# Patient Record
Sex: Male | Born: 1960
Health system: Southern US, Community
[De-identification: ages and names within clinical notes are randomized; demographics above are authoritative.]

## PROBLEM LIST (undated history)

## (undated) DIAGNOSIS — C801 Malignant (primary) neoplasm, unspecified: Secondary | ICD-10-CM

## (undated) DIAGNOSIS — F419 Anxiety disorder, unspecified: Secondary | ICD-10-CM

## (undated) DIAGNOSIS — F1910 Other psychoactive substance abuse, uncomplicated: Secondary | ICD-10-CM

## (undated) DIAGNOSIS — F29 Unspecified psychosis not due to a substance or known physiological condition: Secondary | ICD-10-CM

## (undated) DIAGNOSIS — F259 Schizoaffective disorder, unspecified: Secondary | ICD-10-CM

## (undated) HISTORY — PX: HERNIA REPAIR: SHX51

---

## 2010-04-21 ENCOUNTER — Emergency Department (HOSPITAL_COMMUNITY)
Admission: EM | Admit: 2010-04-21 | Discharge: 2010-04-22 | Disposition: A | Discharge status: Other Acute Inpatient Hospital | Payer: Self-pay | Source: Home / Self Care | Admitting: Emergency Medicine

## 2010-04-21 LAB — COMPREHENSIVE METABOLIC PANEL
ALT: 26 U/L (ref 0–53)
AST: 28 U/L (ref 0–37)
Albumin: 4.4 g/dL (ref 3.5–5.2)
Alkaline Phosphatase: 90 U/L (ref 39–117)
BUN: 9 mg/dL (ref 6–23)
CO2: 23 mEq/L (ref 19–32)
Calcium: 9.1 mg/dL (ref 8.4–10.5)
Chloride: 110 mEq/L (ref 96–112)
Creatinine, Ser: 0.97 mg/dL (ref 0.4–1.5)
GFR calc Af Amer: >60 mL/min (ref >60)
GFR calc non Af Amer: >60 mL/min (ref >60)
Glucose, Bld: 95 mg/dL (ref 70–99)
Potassium: 4.2 mEq/L (ref 3.5–5.1)
Sodium: 144 mEq/L (ref 135–145)
Total Bilirubin: 1 mg/dL (ref 0.3–1.2)
Total Protein: 7.9 g/dL (ref 6.0–8.3)

## 2010-04-21 LAB — CBC
HCT: 42.7 % (ref 39.0–52.0)
Hemoglobin: 15.3 g/dL (ref 13.0–17.0)
MCH: 31.9 pg (ref 26.0–34.0)
MCHC: 35.8 g/dL (ref 30.0–36.0)
MCV: 89 fL (ref 78.0–100.0)
Platelets: 262 10*3/uL (ref 150–400)
RBC: 4.8 MIL/uL (ref 4.22–5.81)
RDW: 14.1 % (ref 11.5–15.5)
WBC: 5.7 10*3/uL (ref 4.0–10.5)

## 2010-04-21 LAB — DIFFERENTIAL
Basophils Absolute: 0 10*3/uL (ref 0.0–0.1)
Basophils Relative: 0 % (ref 0–1)
Eosinophils Absolute: 0 10*3/uL (ref 0.0–0.7)
Eosinophils Relative: 0 % (ref 0–5)
Lymphocytes Relative: 51 % — ABNORMAL HIGH (ref 12–46)
Lymphs Abs: 2.9 10*3/uL (ref 0.7–4.0)
Monocytes Absolute: 0.2 10*3/uL (ref 0.1–1.0)
Monocytes Relative: 3 % (ref 3–12)
Neutro Abs: 2.6 10*3/uL (ref 1.7–7.7)
Neutrophils Relative %: 45 % (ref 43–77)

## 2010-04-21 LAB — RAPID URINE DRUG SCREEN, HOSP PERFORMED
Amphetamines: NOT DETECTED
Barbiturates: NOT DETECTED
Benzodiazepines: NOT DETECTED
Cocaine: NOT DETECTED
Opiates: NOT DETECTED
Tetrahydrocannabinol: POSITIVE — AB

## 2010-04-21 LAB — ETHANOL: Alcohol, Ethyl (B): 295 mg/dL — ABNORMAL HIGH (ref 0–10)

## 2010-04-22 ENCOUNTER — Inpatient Hospital Stay (HOSPITAL_COMMUNITY)
Admission: AD | Admit: 2010-04-22 | Discharge: 2010-04-26 | Payer: Self-pay | Attending: Psychiatry | Admitting: Psychiatry

## 2010-04-22 LAB — ETHANOL: Alcohol, Ethyl (B): <5 mg/dL (ref 0–10)

## 2010-04-22 LAB — GLUCOSE, CAPILLARY: Glucose-Capillary: 94 mg/dL (ref 70–99)

## 2010-04-23 LAB — URINALYSIS, ROUTINE W REFLEX MICROSCOPIC
Bilirubin Urine: NEGATIVE
Ketones, ur: NEGATIVE mg/dL
Leukocytes, UA: NEGATIVE
Nitrite: NEGATIVE
Protein, ur: NEGATIVE mg/dL
Specific Gravity, Urine: 1.013 (ref 1.005–1.030)
Urine Glucose, Fasting: NEGATIVE mg/dL
Urobilinogen, UA: 0.2 mg/dL (ref 0.0–1.0)
pH: 5.5 (ref 5.0–8.0)

## 2010-04-23 LAB — URINE MICROSCOPIC-ADD ON

## 2010-04-27 NOTE — H&P (Signed)
NAME:  Keith Murray, Keith Murray                ACCOUNT NO.:  [id number]  MEDICAL RECORD NO.:  [id number]          PATIENT TYPE:  IPS  LOCATION:  0508                          [facility]ACILITY:  [facility]  PHYSICIAN:  Marlis Edelson, DO        DATE OF BIRTH:  March 26, 1961  DATE OF ADMISSION:  04/22/2010 DATE OF DISCHARGE:                      PSYCHIATRIC ADMISSION ASSESSMENT   This is a 50 year old male who was involuntarily petitioned on April 22, 2010.  HISTORY OF PRESENT ILLNESS:  The patient is here on papers that state that the patient is a danger to harm himself and others, has been smoking crack cocaine and marijuana, and drinking alcohol for several days, stating that he wants to kill himself.  Previously had been on Cogentin and Prolixin and has been committed prior to the [facility] in 2011.  Reporting seeing and hearing things that are not there. The patient states that his niece had convinced him to come and get help with his drinking.  He states that he is only drinking 2 beers a day. He denies any suicidal thoughts or hallucinations.  He does not feel that alcohol is a problem for him and states upon discharge that he will return to [place] to be with family and no longer live with his niece.  PAST PSYCHIATRIC HISTORY:  First admission to [facility]. No current outpatient mental treatment.  SOCIAL HISTORY:  The patient is single and living with his niece in [place].  He is on disability.  FAMILY HISTORY:  None.  ALCOHOL AND DRUG HISTORY:  The patient reporting drinking only 2 beers, but the patient did have a blood alcohol level of 295.  He denies any substance use.  Urine drug screen is positive for cannabis.  PRIMARY CARE PROVIDER:  None.  MEDICAL PROBLEMS:  The patient denies any acute or chronic health issues.  MEDICATIONS:  The patient lists Prolixin injection and receives his medication at the [facility].  Compliance is unknown.  DRUG ALLERGIES:  No  known allergies.  PHYSICAL EXAMINATION:  GENERAL:  This is a normally-developed middle- aged male.  He was assessed in the emergency department at [facility].  Physical exam was reviewed.  No significant findings.  The patient did receive Geodon 20 mg IM.  His laboratory data shows a CBC essentially within normal limits.  Lymphocytes at 51.  Urine drug screen is positive for cannabis.  Alcohol level of 295.  His CMET is within normal limits.  Alcohol level was stated at 295.  The patient did receive again Geodon, as he was agitated and combative.  MENTAL STATUS EXAMINATION:  The patient is fully alert and cooperative. He does appear somewhat guarded.  Answers only questions briefly.  Poor eye contact.  He is casually dressed.  He denies any complaints.  Does not appear to be overly psychotic.  Judgment and insight are fair at this time and overall is a poor historian.  DIAGNOSES:  Axis I:  Alcohol intoxication, alcohol abuse, rule out dependence.  Cannabis abuse. Axis II:  Deferred. Axis III:  No known medical conditions. Axis IV:  Problems with his  primary support group, his niece, possible other psychosocial problems related to substance use, noncompliance of medications. Axis V:  Current is 35.  PLAN:  Put patient on Librium protocol.  Monitor withdrawal symptoms. Address his substance use.  Clarify living situation and continue to assess comorbidities.  Will clarify his Prolixin injection with the [facility].  Will assess the patient's motivation for rehab. Tentative length of stay is 4 to 6 days.     Landry Corporal, N.P.   ______________________________ Marlis Edelson, DO    JO/MEDQ  D:  04/23/2010  T:  04/23/2010  Job:  [id number]  Electronically Signed by Limmie PatriciaP. on 04/26/2010 03:53:08 PM Electronically Signed by Marlis Edelson MD on 04/27/2010 07:57:42 PM

## 2010-05-11 NOTE — Discharge Summary (Addendum)
NAME:  Keith Murray, Keith Murray                ACCOUNT NO.:  [id number]  MEDICAL RECORD NO.:  [id number]           PATIENT TYPE:  I  LOCATION:  0508                          [facility]ILITY[facility]  [facility]  PHYSICIAN:  Marlis Edelson, DO        DATE OF BIRTH:  October 14, 1960  DATE OF ADMISSION:  04/22/2010 DATE OF DISCHARGE:  04/26/2010                              DISCHARGE SUMMARY   CHIEF COMPLAINT:  Substance use.  HISTORY OF CHIEF COMPLAINT:  [name] is a 50 year old African American male admitted to the [facility] April 22, 2010.  The patient had been initially admitted via involuntary petition because of concerns of harming himself in that he had been smoking crack cocaine, using marijuana and drinking alcohol for several days.  [name] has had a longstanding history of psychotic symptoms for which he has been treated with Prolixin injections and Cogentin.  He has been followed by the [facility] for mental health.  He has had perceptual symptoms for a number of years per his account.  He related that his niece had convinced him to come and get help with his drinking. He related that he was only drinking about 2 beers per day at admission. He denied any suicidal thoughts or hallucinations at admission.  He does not feel that alcohol is a problem for him.  He stated upon his discharge he would like to return to [place] to be with family and that he no longer wanted to live or be cared for by his niece in the area. The patient had no prior admissions to [facility].  He did relate that he continues to receive Prolixin injections through the [facility] and that he had a scheduled followup with them on Friday following admission for repeat injection.  [name] is currently living with his niece in [place] and is on disability.  Again, he reported only drinking 2 beers per day.  At the time of his admission his blood alcohol level was 295.  His  urine drug screen was positive for cannabis.  He denied any active or chronic medical issues.  The only known medication was that of Prolixin injection as related above.  He related no known medical allergies.  The patient was seen at [facility] prior to his admission to [facility].  Physical exam showed no significant findings.  He did receive Geodon 20 mg IM in the emergency department.  Laboratory showed a CBC with essentially normal limits. Lymphocytes were 51.  Urine drug screen was positive for cannabis.  CMET was within normal limits.  The mental status examination at the time of admission showed him to be alert and cooperative.  He appeared somewhat guarded.  He answered questions briefly.  He was casually dressed.  He denied any complaints. He did not appear to be overtly psychotic.  His judgment and insight were fair at the time.  He was noted to be a poor reporter.  The impression was alcohol intoxication with alcohol abuse, rule out dependence and cannabis abuse, suspect psychotic disorder given  history of chronic hallucinations.  HOSPITAL COURSE:  [name] was placed on the adult unit where he was integrated into the adult milieu.  His treatment plan was aimed at dealing with his alcohol and drug abuse.  There was also the consideration of ongoing management with Prolixin given his reported history of psychotic disorder.  During the course of his hospitalization he had reported drinking heavily for a couple of years.  He had no medical problems or issues during the course of his hospitalization.  His detoxification was unremarkable with no seizures, severe physical symptoms or DTs.  There was a report that he had been using crack cocaine but was negative for crack cocaine per urine drug screen at the time of his admission. He related no specific problems with cocaine during his stay. Throughout the course of his hospitalization he  denied suicidal ideations or homicidal ideations.  He had no auditory or visual hallucinations.  He had some mild tremors early on but denied any other withdrawal symptoms.  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  He does not particularly care for the [facility] area and had stated that at some point he would like to go to [place] to live with family.  By April 24, 2009, he was sober and expressing no complaints.  On April 25, 2010, he was pleasant and upbeat, good participate in groups.  There were no new problems or concerns.  There was no excessive sedation from his withdrawal protocol.  His sleep and appetite were stable.  During the course of his hospitalization in meetings with social worker and case management he revealed that his niece was angry with him because he would not give her money.  The patient reports that he wanted to move away from [place] and no longer wanted to live with her.  He felt that his sister was his only force of support.  He continued throughout the hospitalization to deny suicidal or homicidal ideation. He had no particular concerns about his substance use feeling that he was not addicted to any drugs and that he consumed a limited amount of alcohol and did not need further substance abuse treatment.  During discharge planning groups he continued to relate that he felt much better and that he was ready for discharge home following completion of the detox protocol.  He denied suicidal ideations or homicidal ideations.  On the date of admission he related that "I feel great".  He had no cravings for marijuana, cocaine or alcohol.  No nausea, vomiting or diarrhea.  He was eating and sleeping well, tremors had resolved.  He related his mood was good with no suicidal or homicidal ideation.  He denied any current psychotic symptoms.  He had successfully completed the detox protocol.  His vital signs were stable.  He was afebrile and mental status  examination was unremarkable.  He had had an uneventful detox period and wished to be discharged home with planned followup for Prolixin injection on Friday at the [facility].  He did not want to participate further in any substance abuse treatment programs and did not feel a need for referral to a residential type program.  LABORATORY AND IMAGING:  Per ER records.  CONSULTATIONS:  None.  COMPLICATIONS:  None.  PROCEDURES:  None.  MENTAL STATUS EXAM:  At time of discharge he was casually dressed, established appropriate eye contact.  His motor behavior was normal. Speech was normal.  Level of consciousness alert.  He related his mood as "I  feel great".  His affect was congruent and full range with him smiling and laughing when appropriate.  He denied significant anxiety or depressive symptoms.  His thought process was coherent.  His thought content was without perceptual symptoms, ideas of reference or delusions.  He denies paranoia.  Specifically he denies any auditory or visual hallucinations.  He did not appear to be responding to internal stimuli.  Judgment appeared to be fair.  Insight was questionable in that the patient may be minimizing his substance abuse issues but is resistant to any further intervention.  He was oriented.  His concentration seemed appropriate and he was pleasant and cooperative.  DIAGNOSES:  AXIS I:  Polysubstance abuse versus dependence.  History of psychotic disorder. AXIS II:  Deferred. AXIS III:  None. AXIS IV:  Current discord with his niece. AXIS V:  55.  CONDITION AT DISCHARGE:  Stable with no expressed suicidal or homicidal thought, intent or plan.  He has no evidence of hypomania or mania.  He is not currently responding to internal stimuli.  Denies any auditory, visual, tactile, gustatory, command or olfactory hallucinations.  He denies paranoia.  He has had an uneventful detox period and is currently physically stable with no  evidence of impending DTs.  No seizures.  DISCHARGE INSTRUCTIONS:  Mr. Sadlon has followup with the [facility] on Thursday April 29, 2010, at 11:00 a.m., emergency number was provided.  He is to return to the hospital for any marked change in mood or affect. He is to return for any suicidal or homicidal ideation.  We recommend involvement in AA/NA with 90 meetings in 90 days.  He is to avoid all substances of abuse both prescription and illicit.  DISCHARGE MEDICATIONS:  Recommend followup as stated with the [facility] for continuation of Prolixin Decanoate injections as previously scheduled.  PROGNOSIS:  Guarded due to a history of polysubstance abuse with limited insight into his need for more aggressive abstinence based treatment.          ______________________________ Marlis Edelson, DO     DB/MEDQ  D:  05/09/2010  T:  05/09/2010  Job:  [id number]  Electronically Signed by Marlis Edelson MD on 05/13/2010 08:37:55 PM

## 2010-07-14 ENCOUNTER — Emergency Department (HOSPITAL_COMMUNITY)
Admission: EM | Admit: 2010-07-14 | Discharge: 2010-07-15 | Disposition: A | Discharge status: Other Acute Inpatient Hospital | Payer: Medicare Other | Source: Home / Self Care | Attending: Emergency Medicine | Admitting: Emergency Medicine

## 2010-07-14 DIAGNOSIS — N509 Disorder of male genital organs, unspecified: Secondary | ICD-10-CM | POA: Insufficient documentation

## 2010-07-14 DIAGNOSIS — N498 Inflammatory disorders of other specified male genital organs: Secondary | ICD-10-CM | POA: Insufficient documentation

## 2010-07-14 LAB — DIFFERENTIAL
Basophils Absolute: 0 10*3/uL (ref 0.0–0.1)
Basophils Relative: 0 % (ref 0–1)
Eosinophils Absolute: 0 10*3/uL (ref 0.0–0.7)
Eosinophils Relative: 0 % (ref 0–5)
Lymphocytes Relative: 8 % — ABNORMAL LOW (ref 12–46)
Lymphs Abs: 1.1 10*3/uL (ref 0.7–4.0)
Monocytes Absolute: 1.2 10*3/uL — ABNORMAL HIGH (ref 0.1–1.0)
Monocytes Relative: 8 % (ref 3–12)
Neutro Abs: 11.9 10*3/uL — ABNORMAL HIGH (ref 1.7–7.7)
Neutrophils Relative %: 84 % — ABNORMAL HIGH (ref 43–77)

## 2010-07-14 LAB — CBC
HCT: 38.5 % — ABNORMAL LOW (ref 39.0–52.0)
Hemoglobin: 13.5 g/dL (ref 13.0–17.0)
MCH: 31.5 pg (ref 26.0–34.0)
MCHC: 35.1 g/dL (ref 30.0–36.0)
MCV: 90 fL (ref 78.0–100.0)
Platelets: 239 10*3/uL (ref 150–400)
RBC: 4.28 MIL/uL (ref 4.22–5.81)
RDW: 13.6 % (ref 11.5–15.5)
WBC: 14.2 10*3/uL — ABNORMAL HIGH (ref 4.0–10.5)

## 2010-07-15 ENCOUNTER — Other Ambulatory Visit: Payer: Self-pay | Admitting: Urology

## 2010-07-15 ENCOUNTER — Emergency Department (HOSPITAL_COMMUNITY): Payer: Medicare Other

## 2010-07-15 ENCOUNTER — Inpatient Hospital Stay (HOSPITAL_COMMUNITY)
Admission: AD | Admit: 2010-07-15 | Discharge: 2010-07-18 | DRG: 728 | Disposition: A | Discharge status: Home / Self Care | Payer: Medicare Other | Source: Other Acute Inpatient Hospital | Attending: Urology | Admitting: Urology

## 2010-07-15 DIAGNOSIS — N498 Inflammatory disorders of other specified male genital organs: Principal | ICD-10-CM | POA: Diagnosis present

## 2010-07-15 DIAGNOSIS — F17200 Nicotine dependence, unspecified, uncomplicated: Secondary | ICD-10-CM | POA: Diagnosis present

## 2010-07-15 LAB — BASIC METABOLIC PANEL
BUN: 5 mg/dL — ABNORMAL LOW (ref 6–23)
CO2: 26 mEq/L (ref 19–32)
Calcium: 9.3 mg/dL (ref 8.4–10.5)
Chloride: 103 mEq/L (ref 96–112)
Creatinine, Ser: 0.79 mg/dL (ref 0.4–1.5)
GFR calc Af Amer: >60 mL/min (ref >60)
GFR calc non Af Amer: >60 mL/min (ref >60)
Glucose, Bld: 118 mg/dL — ABNORMAL HIGH (ref 70–99)
Potassium: 3.7 mEq/L (ref 3.5–5.1)
Sodium: 137 mEq/L (ref 135–145)

## 2010-07-15 LAB — RAPID URINE DRUG SCREEN, HOSP PERFORMED
Amphetamines: NOT DETECTED
Barbiturates: NOT DETECTED
Benzodiazepines: NOT DETECTED
Cocaine: NOT DETECTED
Opiates: NOT DETECTED
Tetrahydrocannabinol: POSITIVE — AB

## 2010-07-15 MED ORDER — IOHEXOL 300 MG/ML  SOLN
100.0000 mL | Freq: Once | INTRAMUSCULAR | Status: AC | PRN
  Administered 2010-07-15: 100 mL via INTRAVENOUS

## 2010-07-15 NOTE — Op Note (Signed)
  NAME:  Keith Murray, Keith Murray                ACCOUNT NO.:  [id number]  MEDICAL RECORD NO.:  [id number]           PATIENT TYPE:  I  LOCATION:  1427                         FACILITY:  [facility]  PHYSICIAN:  Heloise Purpura, MD      DATE OF BIRTH:  04/19/1960  DATE OF PROCEDURE:  07/15/2010 DATE OF DISCHARGE:                              OPERATIVE REPORT   PREOPERATIVE DIAGNOSIS:  Left scrotal abscess.  POSTOPERATIVE DIAGNOSIS:  Left scrotal abscess.  PROCEDURE:  Incision and drainage of left scrotal abscess.  SURGEON:  Heloise Purpura, MD  ANESTHESIA:  General.  COMPLICATIONS:  None.  INTRAOPERATIVE FINDINGS:  The patient was noted have a large amount of purulence within the left hemiscrotum consistent with a scrotal abscess.  INDICATIONS:  [name] is a 50 year old gentleman who presented to the emergency department with a left scrotal abscess.  After reviewing options for management, it was recommended that he undergo incision and drainage.  The potential risks, complications, and alternative treatment options of this procedure were discussed in detail and informed consent was obtained.  DESCRIPTION OF PROCEDURE:  The patient was taken to the operating room and a general anesthetic was administered.  He was given preoperative antibiotics, placed in the supine position, and his genitalia was prepped and draped in the usual sterile fashion.  He had been administered both clindamycin and Unasyn in the emergency department prior to when I saw him.  A preoperative time-out was performed.  The scrotum was then examined.  There was noted to be 2 draining areas in the left upper scrotum where a large amount of purulent material was able to be expressed.  This was sent for aerobic and anaerobic cultures. These openings were then extended in the scrotum and I was able to digitally palpate the scrotal abscess cavity which did appear confined to the left hemiscrotum.  This area was further opened to  allow further inspection and removal of all purulent material.  Tissue cultures were then also obtained for aerobic and anaerobic bacteria.  Hemostasis was achieved with electrocautery and pulse lavage was used to irrigate the wound thoroughly.  Double antibiotic irrigation was also utilized for irrigation.  Hemostasis was again ensured and the wound was then packed with a normal saline wet-to-dry dressing with gauze.  An ABD was placed over the scrotum and the procedure was ended.  He tolerated the procedure well and without complications.  He will be admitted to the hospital for IV antibiotics pending his wound cultures and tissue cultures.  In addition, he will undergo local wound care with wet-to-dry dressing changes.     Heloise Purpura, MD     LB/MEDQ  D:  07/15/2010  T:  07/15/2010  Job:  [id number]  Electronically Signed by Heloise Purpura MD on 07/15/2010 07:26:39 PM

## 2010-07-15 NOTE — Consult Note (Addendum)
NAME:  Keith Murray, Keith Murray                ACCOUNT NO.:  [id number]  MEDICAL RECORD NO.:  [id number]           PATIENT TYPE:  E  LOCATION:  [facility]                         FACILITY:  [facility]  PHYSICIAN:  Heloise Purpura, MD      DATE OF BIRTH:  1961-02-04  DATE OF CONSULTATION:  07/15/2010 DATE OF DISCHARGE:                                CONSULTATION   REASON FOR CONSULTATION:  Scrotal abscess.  PHYSICIAN REQUESTING CONSULTATION:  Marisa Severin, MD  HISTORY:  Keith Murray is a 50 year old gentleman who approximately 1 week ago noted swelling and pain in his upper left scrotum and left inguinal region.  He has previously undergone an I and D for a similar situation multiple years ago while living in [place].  He denies any fever, but did develop increased swelling and pain followed by drainage, which was purulent and bloody a few days later.  He now presents with continued drainage and concern about this area.  He underwent an evaluation in the emergency department, which included a scrotal ultrasound and CT scan of the abdomen and pelvis.  Findings were consistent with a heterogeneously enhancing area in the left upper scrotum consistent with a probable abscess.  He also had reactive inguinal lymphadenopathy without evidence of extension of the abscess into the abdominal cavity.  The patient denies any nausea or vomiting or subjective fever.  He denies any storage or voiding urinary symptoms. He denies any hematuria.  PAST MEDICAL HISTORY:  Schizophrenia.  PAST SURGICAL HISTORY:  Previous I and D of left inguinal/scrotal abscess.  MEDICATIONS:  Prolixin every 2 weeks.  ALLERGIES:  No known drug allergies.  FAMILY HISTORY:  No history of GU malignancy.  SOCIAL HISTORY:  He does have an apparent history of illicit drug use and has tested positive for marijuana in the past.  However, he denies any recent illicit drug use.  He also has a history of intermittent alcohol abuse, although  states he has not had anything to drink for at least the last 3-4 days.  In addition, he does smoke approximately 1-2 packs of cigarettes per day.  LABORATORY DATA:  White blood count 14.2, hemoglobin 13.5, platelet count 239, serum creatinine 0.79.  RADIOLOGIC IMAGING:  I independently reviewed his scrotal ultrasound and CT scan with findings as dictated above.  PHYSICAL EXAMINATION:  VITAL SIGNS:  Temperature 99.5, pulse 94, blood pressure 126/88, respirations 14. CONSTITUTIONAL:  Well-nourished, well-developed, age-appropriate male, in no acute distress. HEENT:  Normocephalic, atraumatic. NECK:  Supple without JVD or lymphadenopathy. CARDIOVASCULAR:  Regular rate and rhythm. LUNGS:  Normal respiratory effort and lungs are clear bilaterally. ABDOMEN:  Soft, nontender, nondistended without abdominal masses. BACK:  No CVA tenderness. GU:  He has a normal circumcised male phallus.  The right testis and hemiscrotum are normal in appearance and to palpation without masses or tenderness.  He does have edema of the left hemiscrotum along with induration.  There is no definite fluctuation.  He does have a drainage from the left upper scrotum, which she is foul-smelling and blood tinged.  No further fluid can be expressed from the scrotum.  He does have mild-to-moderate tenderness with palpation in this area.  The left testis is palpably normal without masses. EXTREMITIES:  No edema. NEUROLOGIC:  Grossly intact.  IMPRESSION:  Left scrotal abscess.  PLAN:  A urine drug toxicology screen will be obtained.  I have recommended that he proceed to the operating room for further incision and drainage of his apparent left scrotal abscess.  He has already been administered broad-spectrum IV antibiotics and wound cultures and tissue cultures will be obtained from the operating room.  I have reviewed the potential risks and complications associated with this procedure, and the patient gives his  informed consent to proceed as planned.     Heloise Purpura, MD     LB/MEDQ  D:  07/15/2010  T:  07/15/2010  Job:  [id number]  cc:   Marisa Severin, MD  Electronically Signed by Heloise Purpura MD on 07/15/2010 07:26:33 PM

## 2010-07-16 LAB — DIFFERENTIAL
Band Neutrophils: 0 % (ref 0–10)
Basophils Absolute: 0 10*3/uL (ref 0.0–0.1)
Basophils Relative: 0 % (ref 0–1)
Blasts: 0 %
Eosinophils Absolute: 0.1 10*3/uL (ref 0.0–0.7)
Eosinophils Relative: 2 % (ref 0–5)
Lymphocytes Relative: 31 % (ref 12–46)
Lymphs Abs: 2.1 10*3/uL (ref 0.7–4.0)
Metamyelocytes Relative: 0 %
Monocytes Absolute: 0.6 10*3/uL (ref 0.1–1.0)
Monocytes Relative: 9 % (ref 3–12)
Myelocytes: 0 %
Neutro Abs: 3.9 10*3/uL (ref 1.7–7.7)
Neutrophils Relative %: 58 % (ref 43–77)
Promyelocytes Absolute: 0 %
nRBC: 0 /100 WBC

## 2010-07-16 LAB — CBC
HCT: 33.6 % — ABNORMAL LOW (ref 39.0–52.0)
Hemoglobin: 11.7 g/dL — ABNORMAL LOW (ref 13.0–17.0)
MCH: 31.6 pg (ref 26.0–34.0)
MCHC: 34.8 g/dL (ref 30.0–36.0)
MCV: 90.8 fL (ref 78.0–100.0)
Platelets: 244 10*3/uL (ref 150–400)
RBC: 3.7 MIL/uL — ABNORMAL LOW (ref 4.22–5.81)
RDW: 13.8 % (ref 11.5–15.5)
WBC: 6.7 10*3/uL (ref 4.0–10.5)

## 2010-07-16 LAB — VANCOMYCIN, TROUGH: Vancomycin Tr: 10.8 ug/mL (ref 10.0–20.0)

## 2010-07-18 LAB — BASIC METABOLIC PANEL
BUN: 4 mg/dL — ABNORMAL LOW (ref 6–23)
CO2: 26 mEq/L (ref 19–32)
Calcium: 8.6 mg/dL (ref 8.4–10.5)
Chloride: 107 mEq/L (ref 96–112)
Creatinine, Ser: 0.97 mg/dL (ref 0.4–1.5)
GFR calc Af Amer: >60 mL/min (ref >60)
GFR calc non Af Amer: >60 mL/min (ref >60)
Glucose, Bld: 122 mg/dL — ABNORMAL HIGH (ref 70–99)
Potassium: 3.9 mEq/L (ref 3.5–5.1)
Sodium: 140 mEq/L (ref 135–145)

## 2010-07-18 LAB — CBC
HCT: 34.1 % — ABNORMAL LOW (ref 39.0–52.0)
Hemoglobin: 11.3 g/dL — ABNORMAL LOW (ref 13.0–17.0)
MCH: 30.5 pg (ref 26.0–34.0)
MCHC: 33.1 g/dL (ref 30.0–36.0)
MCV: 92.2 fL (ref 78.0–100.0)
Platelets: 307 10*3/uL (ref 150–400)
RBC: 3.7 MIL/uL — ABNORMAL LOW (ref 4.22–5.81)
RDW: 14.3 % (ref 11.5–15.5)
WBC: 5.7 10*3/uL (ref 4.0–10.5)

## 2010-07-18 LAB — CULTURE, ROUTINE-ABSCESS

## 2010-07-20 LAB — ANAEROBIC CULTURE: Gram Stain: NONE SEEN

## 2010-10-26 ENCOUNTER — Emergency Department (HOSPITAL_COMMUNITY)
Admission: EM | Admit: 2010-10-26 | Discharge: 2010-10-26 | Disposition: A | Discharge status: Home / Self Care | Payer: Medicare Other | Attending: Emergency Medicine | Admitting: Emergency Medicine

## 2010-10-26 DIAGNOSIS — F1010 Alcohol abuse, uncomplicated: Secondary | ICD-10-CM | POA: Insufficient documentation

## 2010-10-26 DIAGNOSIS — F29 Unspecified psychosis not due to a substance or known physiological condition: Secondary | ICD-10-CM | POA: Insufficient documentation

## 2010-10-26 LAB — BASIC METABOLIC PANEL
BUN: 13 mg/dL (ref 6–23)
CO2: 22 mEq/L (ref 19–32)
Calcium: 9.2 mg/dL (ref 8.4–10.5)
Chloride: 105 mEq/L (ref 96–112)
Creatinine, Ser: 0.83 mg/dL (ref 0.50–1.35)
GFR calc Af Amer: >60 mL/min (ref >60)
GFR calc non Af Amer: >60 mL/min (ref >60)
Glucose, Bld: 96 mg/dL (ref 70–99)
Potassium: 4 mEq/L (ref 3.5–5.1)
Sodium: 139 mEq/L (ref 135–145)

## 2010-10-26 LAB — ETHANOL
Alcohol, Ethyl (B): 328 mg/dL — ABNORMAL HIGH (ref 0–11)
Alcohol, Ethyl (B): 145 mg/dL — ABNORMAL HIGH (ref 0–11)

## 2010-10-26 LAB — RAPID URINE DRUG SCREEN, HOSP PERFORMED
Amphetamines: NOT DETECTED
Barbiturates: NOT DETECTED
Benzodiazepines: NOT DETECTED
Cocaine: NOT DETECTED
Opiates: NOT DETECTED
Tetrahydrocannabinol: POSITIVE — AB

## 2010-10-26 LAB — CBC
HCT: 42.5 % (ref 39.0–52.0)
Hemoglobin: 15.3 g/dL (ref 13.0–17.0)
MCH: 31.7 pg (ref 26.0–34.0)
MCHC: 36 g/dL (ref 30.0–36.0)
MCV: 88.2 fL (ref 78.0–100.0)
Platelets: 215 10*3/uL (ref 150–400)
RBC: 4.82 MIL/uL (ref 4.22–5.81)
RDW: 14.1 % (ref 11.5–15.5)
WBC: 5.5 10*3/uL (ref 4.0–10.5)

## 2010-10-26 LAB — DIFFERENTIAL
Basophils Absolute: 0 10*3/uL (ref 0.0–0.1)
Basophils Relative: 0 % (ref 0–1)
Eosinophils Absolute: 0 10*3/uL (ref 0.0–0.7)
Eosinophils Relative: 0 % (ref 0–5)
Lymphocytes Relative: 51 % — ABNORMAL HIGH (ref 12–46)
Lymphs Abs: 2.8 10*3/uL (ref 0.7–4.0)
Monocytes Absolute: 0.2 10*3/uL (ref 0.1–1.0)
Monocytes Relative: 4 % (ref 3–12)
Neutro Abs: 2.5 10*3/uL (ref 1.7–7.7)
Neutrophils Relative %: 45 % (ref 43–77)

## 2010-11-20 NOTE — Discharge Summary (Signed)
  Keith Murray, Keith Murray                ACCOUNT NO.:  [id number]  MEDICAL RECORD NO.:  [id number]  LOCATION:  1427                         [facility]ACILITY[facility]  [facility]  PHYSICIAN:  Heloise Purpura, MD      DATE OF BIRTH:  04-21-1960  DATE OF ADMISSION:  07/15/2010 DATE OF DISCHARGE:  07/18/2010                              DISCHARGE SUMMARY   REASON FOR ADMISSION:  Scrotal abscess.  DISCHARGE DIAGNOSIS:  Scrotal abscess.  HISTORY AND PHYSICAL:  For full details please see admission history and physical.  Briefly, [name] is a 50 year old gentleman who presented to the emergency department on July 15, 2010 with complaints of upper left scrotal and left inguinal swelling and pain consistent with a left scrotal abscess.  It was recommended that he go to the operating room that evening for drainage.  HOSPITAL COURSE:  On July 15, 2010, the patient was taken to the operating room and underwent incision and drainage of a left scrotal abscess.  He was maintained on IV antibiotics, pending his culture results, which subsequently demonstrated multiple organisms present with none predominant.  He remained afebrile and his white blood count remained stable throughout his hospital course and was 5.7 upon discharge.  He received wet-to-dry dressings and by July 18, 2010 was felt to be stable for discharge home on oral antibiotic therapy.  DISPOSITION:  Home.  DISCHARGE INSTRUCTIONS:  He will receive home health nursing care for continued wound care and his family members had been willing to provide this care as well.  FOLLOWUP:  He will be scheduled to follow up for outpatient evaluation in the next 1-2 weeks.     Heloise Purpura, MD     LB/MEDQ  D:  11/15/2010  T:  11/16/2010  Job:  [id number]  Electronically Signed by Heloise Purpura MD on 11/20/2010 06:32:25 PM

## 2012-08-01 ENCOUNTER — Encounter (HOSPITAL_COMMUNITY): Payer: Self-pay | Admitting: *Deleted

## 2012-08-01 ENCOUNTER — Emergency Department (HOSPITAL_COMMUNITY)
Admission: EM | Admit: 2012-08-01 | Discharge: 2012-08-03 | Disposition: A | Discharge status: Other Acute Inpatient Hospital | Payer: Self-pay | Attending: Emergency Medicine | Admitting: Emergency Medicine

## 2012-08-01 DIAGNOSIS — F1910 Other psychoactive substance abuse, uncomplicated: Secondary | ICD-10-CM

## 2012-08-01 DIAGNOSIS — F29 Unspecified psychosis not due to a substance or known physiological condition: Secondary | ICD-10-CM

## 2012-08-01 DIAGNOSIS — R45850 Homicidal ideations: Secondary | ICD-10-CM

## 2012-08-01 LAB — COMPREHENSIVE METABOLIC PANEL
ALT: 21 U/L (ref 0–53)
AST: 37 U/L (ref 0–37)
Albumin: 4.3 g/dL (ref 3.5–5.2)
Alkaline Phosphatase: 115 U/L (ref 39–117)
BUN: 27 mg/dL — ABNORMAL HIGH (ref 6–23)
CO2: 22 mEq/L (ref 19–32)
Calcium: 9.1 mg/dL (ref 8.4–10.5)
Chloride: 105 mEq/L (ref 96–112)
Creatinine, Ser: 1.39 mg/dL — ABNORMAL HIGH (ref 0.50–1.35)
GFR calc Af Amer: 66 mL/min — ABNORMAL LOW (ref >90)
GFR calc non Af Amer: 57 mL/min — ABNORMAL LOW (ref >90)
Glucose, Bld: 79 mg/dL (ref 70–99)
Potassium: 4.1 mEq/L (ref 3.5–5.1)
Sodium: 140 mEq/L (ref 135–145)
Total Bilirubin: 0.8 mg/dL (ref 0.3–1.2)
Total Protein: 7.5 g/dL (ref 6.0–8.3)

## 2012-08-01 LAB — CBC WITH DIFFERENTIAL/PLATELET
Basophils Absolute: 0 10*3/uL (ref 0.0–0.1)
Basophils Relative: 0 % (ref 0–1)
Eosinophils Absolute: 0 10*3/uL (ref 0.0–0.7)
Eosinophils Relative: 0 % (ref 0–5)
HCT: 41.1 % (ref 39.0–52.0)
Hemoglobin: 14.3 g/dL (ref 13.0–17.0)
Lymphocytes Relative: 34 % (ref 12–46)
Lymphs Abs: 2 10*3/uL (ref 0.7–4.0)
MCH: 31.2 pg (ref 26.0–34.0)
MCHC: 34.8 g/dL (ref 30.0–36.0)
MCV: 89.7 fL (ref 78.0–100.0)
Monocytes Absolute: 0.3 10*3/uL (ref 0.1–1.0)
Monocytes Relative: 5 % (ref 3–12)
Neutro Abs: 3.5 10*3/uL (ref 1.7–7.7)
Neutrophils Relative %: 61 % (ref 43–77)
Platelets: 209 10*3/uL (ref 150–400)
RBC: 4.58 MIL/uL (ref 4.22–5.81)
RDW: 13.7 % (ref 11.5–15.5)
WBC: 5.7 10*3/uL (ref 4.0–10.5)

## 2012-08-01 LAB — ETHANOL: Alcohol, Ethyl (B): 88 mg/dL — ABNORMAL HIGH (ref 0–11)

## 2012-08-01 MED ORDER — ZIPRASIDONE MESYLATE 20 MG IM SOLR
20.0000 mg | Freq: Once | INTRAMUSCULAR | Status: AC
  Administered 2012-08-01: 20 mg via INTRAMUSCULAR
  Filled 2012-08-01: qty 20

## 2012-08-01 MED ORDER — ONDANSETRON HCL 4 MG PO TABS: 4.0000 mg | ORAL_TABLET | Freq: Three times a day (TID) | ORAL | Status: DC | PRN

## 2012-08-01 MED ORDER — ALUM & MAG HYDROXIDE-SIMETH 200-200-20 MG/5ML PO SUSP: 30.0000 mL | ORAL | Status: DC | PRN

## 2012-08-01 MED ORDER — NICOTINE 21 MG/24HR TD PT24
21.0000 mg | MEDICATED_PATCH | Freq: Every day | TRANSDERMAL | Status: DC
  Administered 2012-08-02 – 2012-08-03 (×2): 21 mg via TRANSDERMAL
  Filled 2012-08-01 (×2): qty 1

## 2012-08-01 MED ORDER — IBUPROFEN 600 MG PO TABS
600.0000 mg | ORAL_TABLET | Freq: Three times a day (TID) | ORAL | Status: DC | PRN
  Administered 2012-08-02: 600 mg via ORAL
  Filled 2012-08-01: qty 1

## 2012-08-01 MED ORDER — ACETAMINOPHEN 325 MG PO TABS
650.0000 mg | ORAL_TABLET | ORAL | Status: DC | PRN
  Administered 2012-08-02: 650 mg via ORAL
  Filled 2012-08-01: qty 2

## 2012-08-01 MED ORDER — ZOLPIDEM TARTRATE 5 MG PO TABS
5.0000 mg | ORAL_TABLET | Freq: Every evening | ORAL | Status: DC | PRN
  Administered 2012-08-02 (×2): 5 mg via ORAL
  Filled 2012-08-01 (×2): qty 1

## 2012-08-01 MED ORDER — LORAZEPAM 1 MG PO TABS
1.0000 mg | ORAL_TABLET | Freq: Three times a day (TID) | ORAL | Status: DC | PRN
  Administered 2012-08-02 (×2): 1 mg via ORAL
  Filled 2012-08-01 (×2): qty 1
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
## 2012-08-01 NOTE — ED Notes (Signed)
Restraints charted at 22:00 is for the initiation of restraints which were at 20:30.

## 2012-08-01 NOTE — ED Provider Notes (Addendum)
History     CSN: [id number]  Arrival date & time 08/01/12  2213   First MD Initiated Contact with Patient 08/01/12 2223      Chief Complaint  Patient presents with  . Medical Clearance    (Consider location/radiation/quality/duration/timing/severity/associated sxs/prior treatment) Patient is a 52 y.o. male presenting with mental health disorder. The history is provided by the police. The history is limited by the condition of the patient (pt refusing to answer questions).  Mental Health Problem Presenting symptoms comment:  Police were called to the house due to patient destroying property. He apparently was under IVC and threatening to kill himself and others. When I talked with the patient he looked at me and asked me why was talking about dogs and to not touch Severity:  Severe Most recent episode:  Today Episode history:  Unable to specify Context: drug use   Context comment:  Admit to cocaine use daily   History reviewed. No pertinent past medical history.  History reviewed. No pertinent past surgical history.  No family history on file.  History  Substance Use Topics  . Smoking status: Not on file  . Smokeless tobacco: Not on file  . Alcohol Use: Not on file      Review of Systems  Unable to perform ROS   Allergies  Review of patient's allergies indicates no known allergies.  Home Medications  No current outpatient prescriptions on file.  BP 146/99  Pulse 96  Temp(Src) 98.8 F (37.1 C) (Oral)  Resp 20  SpO2 97%  Physical Exam  Nursing note and vitals reviewed. Constitutional: He appears well-developed and well-nourished. No distress.  In 4-point restraints  HENT:  Head: Normocephalic and atraumatic.  Mouth/Throat: Oropharynx is clear and moist.  Eyes: Conjunctivae and EOM are normal. Pupils are equal, round, and reactive to light.  Neck: Normal range of motion. Neck supple.  Cardiovascular: Normal rate, regular rhythm and intact distal pulses.    No murmur Salman. Pulmonary/Chest: Effort normal and breath sounds normal. No respiratory distress. He has no wheezes. He has no rales.  Abdominal: Soft. He exhibits no distension. There is no tenderness. There is no rebound and no guarding.  Musculoskeletal: Normal range of motion. He exhibits no edema and no tenderness.  Neurological: He is alert.  Skin: Skin is warm and dry. No rash noted. No erythema.  Psychiatric: He is agitated, aggressive and actively hallucinating. He expresses homicidal and suicidal ideation.  Was talking about watching dogs and to not touch him    ED Course  Procedures (including critical care time)  Labs Reviewed  COMPREHENSIVE METABOLIC PANEL - Abnormal; Notable for the following:    BUN 27 (*)    Creatinine, Ser 1.39 (*)    GFR calc non Af Amer 57 (*)    GFR calc Af Amer 66 (*)    All other components within normal limits  ETHANOL - Abnormal; Notable for the following:    Alcohol, Ethyl (B) 88 (*)    All other components within normal limits  CBC WITH DIFFERENTIAL  URINE RAPID DRUG SCREEN (HOSP PERFORMED)   No results found.   No diagnosis found.    MDM   Patient was blocked but in by police handcuffed under IVC threatening to kill himself and everyone else. Apparently on the scene he was destroying property and was violent with police. On exam here patient answers very few questions and appears to be hallucinating and was talking about the legs and with violent. He  is currently in her strength in the bed and was given geodon. Medical clearance labs pending. No prior history on this patient except he said that he used to get injections in his shoulder. Also reports to using cocaine daily.  11:49 PM Labs relatively normal.  Will discuss with ACT.      Gwyneth Sprout, MD 08/01/12 6045  Gwyneth Sprout, MD 08/01/12 2350

## 2012-08-01 NOTE — ED Notes (Addendum)
Pt states, "I was at home by myself cleaning when all of the sudden the cops where at my front door with my niece."  Pt reports he will "kill them all"  Pt reports he going to "shoot as many as I can."

## 2012-08-02 LAB — RAPID URINE DRUG SCREEN, HOSP PERFORMED
Amphetamines: NOT DETECTED
Barbiturates: NOT DETECTED
Benzodiazepines: NOT DETECTED
Cocaine: POSITIVE — AB
Opiates: NOT DETECTED
Tetrahydrocannabinol: POSITIVE — AB

## 2012-08-02 NOTE — ED Notes (Signed)
Remains pleasant and cooperative w/ this Clinical research associate,  Continues to deny AVH, but is aggitated at times--waving arms, talking loudly

## 2012-08-02 NOTE — ED Notes (Signed)
Up in the room, watching tv

## 2012-08-02 NOTE — ED Notes (Signed)
Pt requesting medication to help pt sleep.

## 2012-08-02 NOTE — ED Notes (Signed)
Dr. Jonnalagadda into see 

## 2012-08-02 NOTE — ED Notes (Signed)
Patient reports that he is unable to give urine sample at this time.

## 2012-08-02 NOTE — ED Provider Notes (Signed)
Assuming care of patient this morning. Patient in the ED for SI and aggressive, violent behavior. Awaiting psych evaluation. Workup thus far is negative. Patient had no complains, no concerns from the nursing side. Will continue to monitor.  Filed Vitals:   08/02/12 0334  BP: 130/85  Pulse: 62  Temp: 97.8 F (36.6 C)  Resp: 18      Derwood Kaplan, MD 08/02/12 [phone]

## 2012-08-02 NOTE — ED Notes (Signed)
First opinion filled out by Clinical research associate, will take to EDP shortly for them to fill out.

## 2012-08-02 NOTE — ED Notes (Addendum)
In the shower talking/cursing/yelling at some one to leave him alone, go away and the he was going to knock their blocks off.  When questioned pt reported that he was praying.

## 2012-08-02 NOTE — ED Notes (Signed)
Up to the bathroom 

## 2012-08-02 NOTE — ED Notes (Signed)
In the shower 

## 2012-08-02 NOTE — ED Notes (Signed)
Up tot he bathroom to shower and change scrubs 

## 2012-08-03 ENCOUNTER — Inpatient Hospital Stay (HOSPITAL_COMMUNITY)
Admission: AD | Admit: 2012-08-03 | Discharge: 2012-08-09 | DRG: 885 | Disposition: A | Discharge status: Home / Self Care | Payer: Federal, State, Local not specified - Other | Source: Intra-hospital | Attending: Psychiatry | Admitting: Psychiatry

## 2012-08-03 ENCOUNTER — Encounter (HOSPITAL_COMMUNITY): Payer: Self-pay | Admitting: *Deleted

## 2012-08-03 ENCOUNTER — Encounter (HOSPITAL_COMMUNITY): Payer: Self-pay | Admitting: Intensive Care

## 2012-08-03 DIAGNOSIS — F1410 Cocaine abuse, uncomplicated: Secondary | ICD-10-CM | POA: Diagnosis present

## 2012-08-03 DIAGNOSIS — F259 Schizoaffective disorder, unspecified: Principal | ICD-10-CM | POA: Diagnosis present

## 2012-08-03 DIAGNOSIS — F29 Unspecified psychosis not due to a substance or known physiological condition: Secondary | ICD-10-CM | POA: Diagnosis present

## 2012-08-03 DIAGNOSIS — F1010 Alcohol abuse, uncomplicated: Secondary | ICD-10-CM | POA: Diagnosis present

## 2012-08-03 DIAGNOSIS — F1920 Other psychoactive substance dependence, uncomplicated: Secondary | ICD-10-CM

## 2012-08-03 DIAGNOSIS — F39 Unspecified mood [affective] disorder: Secondary | ICD-10-CM

## 2012-08-03 DIAGNOSIS — Z79899 Other long term (current) drug therapy: Secondary | ICD-10-CM

## 2012-08-03 MED ORDER — LORAZEPAM 1 MG PO TABS: 1.0000 mg | ORAL_TABLET | Freq: Three times a day (TID) | ORAL | Status: DC | PRN

## 2012-08-03 MED ORDER — ACETAMINOPHEN 325 MG PO TABS
650.0000 mg | ORAL_TABLET | Freq: Four times a day (QID) | ORAL | Status: DC | PRN
  Administered 2012-08-03 – 2012-08-07 (×3): 650 mg via ORAL

## 2012-08-03 MED ORDER — ZIPRASIDONE MESYLATE 20 MG IM SOLR: 20.0000 mg | Freq: Four times a day (QID) | INTRAMUSCULAR | Status: DC | PRN

## 2012-08-03 MED ORDER — NICOTINE 21 MG/24HR TD PT24
21.0000 mg | MEDICATED_PATCH | Freq: Every day | TRANSDERMAL | Status: DC
  Administered 2012-08-04 – 2012-08-09 (×6): 21 mg via TRANSDERMAL
  Filled 2012-08-03 (×8): qty 1

## 2012-08-03 MED ORDER — MAGNESIUM HYDROXIDE 400 MG/5ML PO SUSP: 30.0000 mL | Freq: Every day | ORAL | Status: DC | PRN

## 2012-08-03 MED ORDER — ALUM & MAG HYDROXIDE-SIMETH 200-200-20 MG/5ML PO SUSP: 30.0000 mL | ORAL | Status: DC | PRN

## 2012-08-03 MED ORDER — LORAZEPAM 1 MG PO TABS
2.0000 mg | ORAL_TABLET | Freq: Once | ORAL | Status: AC
  Administered 2012-08-03: 2 mg via ORAL
  Filled 2012-08-03: qty 2

## 2012-08-03 MED ORDER — LORAZEPAM 1 MG PO TABS
1.0000 mg | ORAL_TABLET | Freq: Three times a day (TID) | ORAL | Status: DC | PRN
  Administered 2012-08-04 – 2012-08-05 (×3): 1 mg via ORAL
  Filled 2012-08-03 (×3): qty 1

## 2012-08-03 NOTE — ED Notes (Signed)
Pt not very cooperative or talkative. He kept his head covered for most of the assessment. He got upset when asked about [employer] coming to his home. He did admit to going after them but says it's because they came after him first. He denies SI/HI/AVH. However, previous RN observed him talking to himself in the bathroom today. IVC papers say he'd threatened to kill himself and anyone who approached him as well. Pt denied drug or ETOH abuse but was +cocaine/THC.

## 2012-08-03 NOTE — Progress Notes (Addendum)
Patient admitted to [facility] from [facility]. Patient states that his niece "called the polices" because he "was cleaning his house." Patient states that when police arrived, the patient became angry and "told them to leave." The patient reports that when they refused, he tried to "get them." The patient currently denies any SI/HI and auditory and visual hallucinations. The patient denies any current drug or alcohol abuse (although patient's ETOH level was 88 on 08/01/12 and patient's UDS was positive for THC and Cocaine on 08/02/12). Patient was given education regarding unit rules and regulations. Patient was educated regarding his fall risk status (low). Patient escorted to unit by RN and MHT. Patient currently denies any needs or concerns at this time. Will continue to monitor patient for safety.

## 2012-08-03 NOTE — Progress Notes (Signed)
Patient accepted to 400-1 by Assunta Found, NP to Dr. Gloris Manchester service. Rosey Bath, RN

## 2012-08-03 NOTE — Progress Notes (Signed)
Adult Psychoeducational Group Note  Date:  08/03/2012 Time:  8:30PM Group Topic/Focus:  Wrap-Up Group:   The focus of this group is to help patients review their daily goal of treatment and discuss progress on daily workbooks.  Participation Level:  Did Not Attend   Additional Comments: Pt. Didn't attend group.   Bing Plume D 08/03/2012, 9:08 PM

## 2012-08-03 NOTE — BH Assessment (Signed)
Assessment Note   Keith Murray is a 52 y.o. male who presents via IVC by [employer].  This writer attempted to interview pt, however he refused to answer any questions and was irritable.  The following information is collateral.  Upon arrival to the emerg dept, pt had been combative with [employer] and required physical restraint by handcuffs.  Pt was threatening to kill self and anyone who approached him.  Pt has no specific plan to harm self or others.  When [employer] arrived at pt.'s home he was found destroying the property and aggressively approached police with a shovel and a piece of wood.    Pt told medical staff that he was cleaning his home when the police arrived with his niece.  Pt communicated that he would "kill them all" and "shoot as many as I can".  Pt was observed by psych emerg dept nurse in the shower talking, cursing and yelling at someone to leave him alone and go away because he was going to "knock their blocks off'.  When pt was asked who was addressing, he stated he was praying.  Pt has hx of sa; abusing cocaine, alcohol and thc, however pt refused to elaborate on usage.  Pt will be further evaluated by psychiatrist or telepsych to determine final disposition.   Axis I: Mood Disorder NOS and Polysubstance Abuse  Axis II: Deferred Axis III:  Past Medical History  Diagnosis Date  . Mental disorder    Axis IV: other psychosocial or environmental problems, problems related to social environment and problems with primary support group Axis V: 31-40 impairment in reality testing  Past Medical History:  Past Medical History  Diagnosis Date  . Mental disorder     History reviewed. No pertinent past surgical history.  Family History: No family history on file.  Social History:  reports that  drinks alcohol. He reports that he uses illicit drugs (Cocaine and Marijuana). His tobacco history is not on file.  Additional Social History:  Alcohol / Drug Use Pain Medications: See MAR   Prescriptions: See MAR  Over the Counter: See MAR  History of alcohol / drug use?: Yes Longest period of sobriety (when/how long): Unk  Withdrawal Symptoms: Other (Comment) (No current w/d sxs)  CIWA: CIWA-Ar BP: 126/73 mmHg Pulse Rate: 62 Nausea and Vomiting: no nausea and no vomiting Tactile Disturbances: none Tremor: no tremor Auditory Disturbances: not present Paroxysmal Sweats: no sweat visible Visual Disturbances: not present Anxiety: no anxiety, at ease Headache, Fullness in Head: none present Agitation: normal activity Orientation and Clouding of Sensorium: oriented and can do serial additions CIWA-Ar Total: 0 COWS:    Allergies: No Known Allergies  Home Medications:  (Not in a hospital admission)  OB/GYN Status:  No LMP for male patient.  General Assessment Data Location of Assessment: [facility] Living Arrangements: Alone Can pt return to current living arrangement?: Yes Admission Status: Involuntary Is patient capable of signing voluntary admission?: No Transfer from: Acute Hospital Referral Source: MD  Education Status Is patient currently in school?: No Current Grade: None  Highest grade of school patient has completed: None  Name of school: None  Contact person: None   Risk to self Suicidal Ideation: Yes-Currently Present Suicidal Intent: Yes-Currently Present Is patient at risk for suicide?: Yes Suicidal Plan?: No-Not Currently/Within Last 6 Months (Unk---did not disclose) Access to Means: No (Unk ) What has been your use of drugs/alcohol within the last 12 months?: Abusing: cocaine, alcohol, thc  Previous Attempts/Gestures: No (Unk )  How many times?: 0 Other Self Harm Risks: 0 Triggers for Past Attempts: Unpredictable;Unknown Intentional Self Injurious Behavior: None Family Suicide History: No Recent stressful life event(s): Other (Comment) (Unk ) Persecutory voices/beliefs?: No Depression: Yes Depression Symptoms: Feeling  angry/irritable Substance abuse history and/or treatment for substance abuse?: Yes Suicide prevention information given to non-admitted patients: Not applicable  Risk to Others Homicidal Ideation: No Thoughts of Harm to Others: No Current Homicidal Intent: No Current Homicidal Plan: No Access to Homicidal Means: No Identified Victim: None  History of harm to others?: No Assessment of Violence: None Noted Violent Behavior Description: None  Does patient have access to weapons?: No Criminal Charges Pending?: No Does patient have a court date: No  Psychosis Hallucinations: None noted Delusions: None noted  Mental Status Report Appear/Hygiene: Body odor;Disheveled;Poor hygiene Eye Contact: Poor Motor Activity: Unremarkable Speech: Aggressive Level of Consciousness: Irritable Mood: Irritable Affect: Irritable Anxiety Level: None Thought Processes: Irrelevant Judgement: Impaired Orientation: Person;Place;Situation;Time Obsessive Compulsive Thoughts/Behaviors: None  Cognitive Functioning Concentration: Normal Memory: Recent Intact;Remote Intact IQ: Average Insight: Poor Impulse Control: Poor Appetite: Fair Weight Loss: 0 Weight Gain: 0 Sleep: No Change Total Hours of Sleep: 6 Vegetative Symptoms: None  ADLScreening [facility] Assessment Services) Patient's cognitive ability adequate to safely complete daily activities?: Yes Patient able to express need for assistance with ADLs?: Yes Independently performs ADLs?: Yes (appropriate for developmental age)  Abuse/Neglect [facility]) Physical Abuse: Denies Verbal Abuse: Denies Sexual Abuse: Denies  Prior Inpatient Therapy Prior Inpatient Therapy: Yes Prior Therapy Dates: 2012 Prior Therapy Facilty/Provider([facility]): [facility]  Reason for Treatment: SA/Depression/SI   Prior Outpatient Therapy Prior Outpatient Therapy: No Prior Therapy Dates: None  Prior Therapy Facilty/Provider(s): None  Reason for Treatment: None   ADL Screening  (condition at time of admission) Patient's cognitive ability adequate to safely complete daily activities?: Yes Patient able to express need for assistance with ADLs?: Yes Independently performs ADLs?: Yes (appropriate for developmental age) Weakness of Legs: None Weakness of Arms/Hands: None  Home Assistive Devices/Equipment Home Assistive Devices/Equipment: None  Therapy Consults (therapy consults require a physician order) PT Evaluation Needed: No OT Evalulation Needed: No SLP Evaluation Needed: No Abuse/Neglect Assessment (Assessment to be complete while patient is alone) Physical Abuse: Denies Verbal Abuse: Denies Sexual Abuse: Denies Exploitation of patient/patient's resources: Denies Self-Neglect: Denies Values / Beliefs Cultural Requests During Hospitalization: None Spiritual Requests During Hospitalization: None Consults Spiritual Care Consult Needed: No Social Work Consult Needed: No Merchant navy officer (For Healthcare) Advance Directive: Patient does not have advance directive;Patient would not like information Pre-existing out of facility DNR order (yellow form or pink MOST form): No Nutrition Screen- [facility] Patient's home diet: Regular Have you recently lost weight without trying?: No Have you been eating poorly because of a decreased appetite?: No Malnutrition Screening Tool Score: 0  Additional Information 1:1 In Past 12 Months?: No CIRT Risk: No Elopement Risk: No Does patient have medical clearance?: Yes     Disposition:  Disposition Initial Assessment Completed for this Encounter: Yes Disposition of Patient: Other dispositions (Psych Eval in the am ) Other disposition(s): Other (Comment) (Psych Eval in the am )  On Site Evaluation by:   Reviewed with Physician:     Murrell Redden 08/03/2012 3:13 AM

## 2012-08-03 NOTE — ED Provider Notes (Signed)
Filed Vitals:   08/03/12 0637  BP: 109/72  Pulse: 70  Temp: 97.6 F (36.4 C)  Resp: 18    Pt brought IVC by police due to being combative, presumed psychosis, threats to kill self. Probable drug abuse history. Cocaine and THC in drug screen.  Pt is medically cleared.  Staff will remind Dr. Shela Commons to evaluate pt in the ED and provide recommendations.  Pt likely requires inpt.    Gavin Pound. Oletta Lamas, MD 08/03/12 [phone]

## 2012-08-03 NOTE — ED Notes (Signed)
Pt asked for something else to help him sleep as the Ativan 1mg  and Ambien 5mg  given didn't work at all. He said he normally takes something different but unable to recall the name of the medication. Tried to call EDP, he will call me back.

## 2012-08-03 NOTE — Progress Notes (Signed)
Patient accepted to 400-1 by Assunta Found, NP to Dr. Gloris Manchester service. Rosey Bath, RN. RN informed and EDP aware. Patient to be transported by [employer] as patient under ivc. Patient support paperwork faxed and to be sent with patient.   Catha Gosselin, LCSWA  [phone] .08/03/2012 1613pm

## 2012-08-03 NOTE — Progress Notes (Signed)
Patient ID: Keith Murray, male   DOB: 29-Nov-1960, 52 y.o.   MRN: [id number] D: Patient  denies SI/HI/AVH. Pt c/o of bilateral shlouder pain from previous injections. Pt mood/affect is depressed and flat.  Pt was irritable during assessment but calmed down after a while. Pt denies any needs or concerns.  Cooperative with assessment. No acute distressed noted at this time.   A: Met with pt 1:1. Medications administered as prescribed. Writer encouraged pt to discuss feelings. Pt encouraged to come to staff with any question or concerns. 15 minutes checks for safety.  R: Patient remains safe. Continue current POC.

## 2012-08-03 NOTE — Tx Team (Signed)
Initial Interdisciplinary Treatment Plan  PATIENT STRENGTHS: (choose at least two) Average or above average intelligence Communication skills  PATIENT STRESSORS: Marital or family conflict Medication change or noncompliance Occupational concerns Substance abuse   PROBLEM LIST: Problem List/Patient Goals Date to be addressed Date deferred Reason deferred Estimated date of resolution  Psychosis 08/03/12                                                      DISCHARGE CRITERIA:  Ability to meet basic life and health needs Adequate post-discharge living arrangements Improved stabilization in mood, thinking, and/or behavior Medical problems require only outpatient monitoring  PRELIMINARY DISCHARGE PLAN: Attend aftercare/continuing care group Outpatient therapy  PATIENT/FAMIILY INVOLVEMENT: This treatment plan has been presented to and reviewed with the patient, Liz Beach.  The patient and family have been given the opportunity to ask questions and make suggestions.  Nestor Ramp [facility] 08/03/2012, 5:33 PM

## 2012-08-03 NOTE — Consult Note (Signed)
Reason for Consult: Mood Disorder NOS and Polysubstance Abuse  Referring Physician: Dr. Lottie Rater Bauer is an 52 y.o. male.  HPI: Patient came to the [facility] long emergency department with IVC by [employer]. Patient has been guarded and  refused to answer questions and was irritable. Reportedly patient has been combative with [employer] and required physical restraint by handcuffs. Pt was threatening to kill self and anyone who approached him. Pt has no specific plan to harm self or others. When [employer] arrived at pt.'s home he was found destroying the property and aggressively approached police with a shovel and a piece of wood.   Reportedly he was cleaning his home when the police arrived with his niece. Pt communicated that he would "kill them all" and "shoot as many as I can". Pt was observed by psych emerg dept nurse in the shower talking, cursing and yelling at someone to leave him alone and go away because he was going to "knock their blocks off'. When pt was asked who was addressing, he stated he was praying. Pt has hx of sa; abusing cocaine, alcohol and thc, however pt refused to elaborate on usage. Pt will be further evaluated by psychiatrist or telepsych to determine final disposition  Mental Status Examination: Patient appeared as per his stated age and fairly groomed, and maintaining good eye contact. Patient has fine mood and his affect was constricted. He has normal rate, rhythm, and volume of speech. His thought process is linear and goal directed. Patient has ben guarded and denied suicidal, homicidal ideations, intentions or plans. Patient has no evidence of auditory or visual hallucinations, delusions, and paranoia. Patient has poor insight judgment and impulse control.  Past Medical History  Diagnosis Date  . Mental disorder     History reviewed. No pertinent past surgical history.  No family history on file.  Social History:  reports that  drinks alcohol. He reports that he uses illicit drugs  (Cocaine and Marijuana). His tobacco history is not on file.  Allergies: No Known Allergies  Medications: I have reviewed the patient's current medications.  Results for orders placed during the hospital encounter of 08/01/12 (from the past 48 hour(s))  CBC WITH DIFFERENTIAL     Status: None   Collection Time    08/01/12 10:25 PM      Result Value Range   WBC 5.7  4.0 - 10.5 K/uL   RBC 4.58  4.22 - 5.81 MIL/uL   Hemoglobin 14.3  13.0 - 17.0 g/dL   HCT 78.4  69.6 - 29.5 %   MCV 89.7  78.0 - 100.0 fL   MCH 31.2  26.0 - 34.0 pg   MCHC 34.8  30.0 - 36.0 g/dL   RDW 28.4  13.2 - 44.0 %   Platelets 209  150 - 400 K/uL   Neutrophils Relative 61  43 - 77 %   Neutro Abs 3.5  1.7 - 7.7 K/uL   Lymphocytes Relative 34  12 - 46 %   Lymphs Abs 2.0  0.7 - 4.0 K/uL   Monocytes Relative 5  3 - 12 %   Monocytes Absolute 0.3  0.1 - 1.0 K/uL   Eosinophils Relative 0  0 - 5 %   Eosinophils Absolute 0.0  0.0 - 0.7 K/uL   Basophils Relative 0  0 - 1 %   Basophils Absolute 0.0  0.0 - 0.1 K/uL  COMPREHENSIVE METABOLIC PANEL     Status: Abnormal   Collection Time  08/01/12 10:25 PM      Result Value Range   Sodium 140  135 - 145 mEq/L   Potassium 4.1  3.5 - 5.1 mEq/L   Chloride 105  96 - 112 mEq/L   CO2 22  19 - 32 mEq/L   Glucose, Bld 79  70 - 99 mg/dL   BUN 27 (*) 6 - 23 mg/dL   Creatinine, Ser 1.61 (*) 0.50 - 1.35 mg/dL   Calcium 9.1  8.4 - 09.6 mg/dL   Total Protein 7.5  6.0 - 8.3 g/dL   Albumin 4.3  3.5 - 5.2 g/dL   AST 37  0 - 37 U/L   ALT 21  0 - 53 U/L   Alkaline Phosphatase 115  39 - 117 U/L   Total Bilirubin 0.8  0.3 - 1.2 mg/dL   GFR calc non Af Amer 57 (*) >90 mL/min   GFR calc Af Amer 66 (*) >90 mL/min   Comment:            The eGFR has been calculated     using the CKD EPI equation.     This calculation has not been     validated in all clinical     situations.     eGFR's persistently     <90 mL/min signify     possible Chronic Kidney Disease.  ETHANOL     Status:  Abnormal   Collection Time    08/01/12 10:25 PM      Result Value Range   Alcohol, Ethyl (B) 88 (*) 0 - 11 mg/dL   Comment:            LOWEST DETECTABLE LIMIT FOR     SERUM ALCOHOL IS 11 mg/dL     FOR MEDICAL PURPOSES ONLY  URINE RAPID DRUG SCREEN (HOSP PERFORMED)     Status: Abnormal   Collection Time    08/02/12  8:05 AM      Result Value Range   Opiates NONE DETECTED  NONE DETECTED   Cocaine POSITIVE (*) NONE DETECTED   Benzodiazepines NONE DETECTED  NONE DETECTED   Amphetamines NONE DETECTED  NONE DETECTED   Tetrahydrocannabinol POSITIVE (*) NONE DETECTED   Barbiturates NONE DETECTED  NONE DETECTED   Comment:            DRUG SCREEN FOR MEDICAL PURPOSES     ONLY.  IF CONFIRMATION IS NEEDED     FOR ANY PURPOSE, NOTIFY LAB     WITHIN 5 DAYS.                LOWEST DETECTABLE LIMITS     FOR URINE DRUG SCREEN     Drug Class       Cutoff (ng/mL)     Amphetamine      1000     Barbiturate      200     Benzodiazepine   200     Tricyclics       300     Opiates          300     Cocaine          300     THC              50    No results found.  Positive for aggressive behavior, bad mood, behavior problems, bipolar, excessive alcohol consumption, illegal drug usage and sleep disturbance Blood pressure 109/72, pulse 70, temperature 97.6 F (36.4 C), temperature source Oral, resp.  rate 18, SpO2 98.00%.   Assessment/Plan: 1. Mood Disorder NOS 2. Polysubstance Abuse   Recommendation: recommended acute psychiatric hospitalization for crisis stabilization and appropriate medication management. Continue his current medications without changes   Amai Cappiello,JANARDHAHA R. 08/03/2012, 11:34 AM

## 2012-08-04 ENCOUNTER — Encounter (HOSPITAL_COMMUNITY): Payer: Self-pay | Admitting: Psychiatry

## 2012-08-04 DIAGNOSIS — F1910 Other psychoactive substance abuse, uncomplicated: Secondary | ICD-10-CM

## 2012-08-04 DIAGNOSIS — F29 Unspecified psychosis not due to a substance or known physiological condition: Secondary | ICD-10-CM | POA: Diagnosis present

## 2012-08-04 MED ORDER — HALOPERIDOL 5 MG PO TABS
5.0000 mg | ORAL_TABLET | Freq: Two times a day (BID) | ORAL | Status: DC
  Administered 2012-08-04 – 2012-08-09 (×11): 5 mg via ORAL
  Filled 2012-08-04 (×6): qty 1
  Filled 2012-08-04: qty 28
  Filled 2012-08-04 (×3): qty 1
  Filled 2012-08-04: qty 28
  Filled 2012-08-04 (×3): qty 1
  Filled 2012-08-04: qty 28
  Filled 2012-08-04 (×3): qty 1
  Filled 2012-08-04: qty 28

## 2012-08-04 MED ORDER — HALOPERIDOL 5 MG PO TABS: 5.0000 mg | ORAL_TABLET | Freq: Three times a day (TID) | ORAL | Status: DC

## 2012-08-04 MED ORDER — BENZTROPINE MESYLATE 0.5 MG PO TABS
0.5000 mg | ORAL_TABLET | Freq: Two times a day (BID) | ORAL | Status: DC
  Administered 2012-08-04 – 2012-08-09 (×11): 0.5 mg via ORAL
  Filled 2012-08-04: qty 28
  Filled 2012-08-04 (×5): qty 1
  Filled 2012-08-04: qty 28
  Filled 2012-08-04 (×5): qty 1
  Filled 2012-08-04: qty 28
  Filled 2012-08-04: qty 1
  Filled 2012-08-04: qty 28
  Filled 2012-08-04 (×3): qty 1

## 2012-08-04 MED ORDER — TRAZODONE HCL 100 MG PO TABS
100.0000 mg | ORAL_TABLET | Freq: Every day | ORAL | Status: DC
  Administered 2012-08-04 – 2012-08-08 (×5): 100 mg via ORAL
  Filled 2012-08-04: qty 1
  Filled 2012-08-04: qty 14
  Filled 2012-08-04 (×2): qty 1
  Filled 2012-08-04: qty 14
  Filled 2012-08-04 (×3): qty 1

## 2012-08-04 NOTE — Progress Notes (Signed)
Psychoeducational Group Note  Date:  08/04/2012 Time:  2000  Group Topic/Focus:  Wrap-Up Group:   The focus of this group is to help patients review their daily goal of treatment and discuss progress on daily workbooks.  Participation Level: Did Not Attend  Participation Quality:  Not Applicable  Affect:  Not Applicable  Cognitive:  Not Applicable  Insight:  Not Applicable  Engagement in Group: Not Applicable  Additional Comments:  Pt did not attend  Christ Kick 08/04/2012, 9:04 PM

## 2012-08-04 NOTE — Progress Notes (Signed)
D.  Pt very pleasant and polite on approach, in bed, did not attend evening group.  Denies SI/HI/hallucinations at this time.  Stays in room, minimal interaction on unit.  A.  Support and encouragement offered R.  Pt remains safe on unit, will continue to monitor.

## 2012-08-04 NOTE — [facility] Suicide Risk Assessment (Signed)
Suicide Risk Assessment  Admission Assessment     Information obtained from:  Pt, staff report, and records Demographic factors:   Current Mental Status per Physician Patient denies suicidal or homicidal ideation, hallucinations, illusions, or delusions. Patient engages with good eye contact, is able to focus adequately in a one to one setting, and has clear goal directed thoughts. Patient speaks with a natural conversational volume, rate, and tone. Anxiety was reported at 0 on a scale of 1 the least and 10 the most. Depression was reported at 0 on the same scale. Patient is oriented times 4, recent and remote memory intact. Judgement: limited by his mental illness Insight: limited by his mental illness  Loss Factors:   Historical Factors:   Risk Reduction Factors:    CLINICAL FACTORS:   Schizophrenia:   Paranoid or undifferentiated type  COGNITIVE FEATURES THAT CONTRIBUTE TO RISK:  Closed-mindedness Thought constriction (tunnel vision)    SUICIDE RISK:   Mild:  Suicidal ideation of limited frequency, intensity, duration, and specificity.  There are no identifiable plans, no associated intent, mild dysphoria and related symptoms, good self-control (both objective and subjective assessment), few other risk factors, and identifiable protective factors, including available and accessible social support.  PLAN OF CARE: 1 Admit for crisis management and stabilization.  Estimate length of stay is 3 to 5 days.  2. Medication management to reduce current symptoms to base line and improve the patient's overall level of functioning.  3. Treat health problems as indicated:  4. Develop treatment plan to decrease risk of relapse upon discharge and the need for readmission.  5. Psycho-social education regarding relapse prevention and self-care.  6. Review and reinstate any pertinent home medication for other health issues where appropriate.  7. Call for Consult with [facility] for  additional specialty patient services as needed.  I certify that inpatient services furnished can reasonably be expected to improve the patient's condition.  Orson Aloe, MD, [facility] 08/04/2012 1:58 PM
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
## 2012-08-04 NOTE — Progress Notes (Signed)
D) Pt has attended the groups today and is pleasant but says very little. Did as the provided this morning for something for anxiety, which he was given. Interactions are minimal and Pt will go to his room to sleep often. Denies SI and HI. A) When interacting with Pt a soft non threatening voice is used. Given positive feed back when appropriate. Not pushing Pt to do what he doesn't want to do. Praised for taking a shower. R) Denies SI and HI. Will out into the milieu occasionally and then go back to his room to rest.
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
## 2012-08-04 NOTE — H&P (Signed)
Medical/psychiatric examination/treatment/procedure(s) were performed by non-physician practitioner and as supervising physician I was immediately available for consultation/collaboration. I agree with this note. Upheld commitment

## 2012-08-05 NOTE — Progress Notes (Signed)
D.  Pt pleasant and polite on approach, hopeful to go home soon.  Denies SI/HI/hallucinations of any kind.  Stays in room most of the time, does not attend groups.  No complaints voiced other than poor sleep.  A.  Support and encouragement offered.  R.  Will continue to monitor.
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
## 2012-08-05 NOTE — Progress Notes (Signed)
D) Pt has remained in his room much of the shift sleeping. Is pleasant and respectful with interaction. States he doesn't want to be here. Sleeps much of the day and told this writer he needed something different for sleep because he didn't sleep well last night. A) Given support and reassurance when appropriate. Using a soft no threatening voice. Thank Pt for whatever he does for himself. R) Remains withdrawn. His brief periods of coming out into the milieu are short and Pt is pleasant when interacted with. Does not initiate conversation.
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
## 2012-08-06 NOTE — Progress Notes (Signed)
D: Patient denies SI/HI and A/V hallucinations; patient declined self inventory; patient does not appear to be responding to any internal stimuli  A: Monitored q 15 minutes; patient encouraged to attend groups; patient educated about medications; patient given medications per physician orders; patient encouraged to express feelings and/or concerns  R: Patient is guarded but cooperative; patient is flat and blunted; patient does behave in a manner that he is possibly suspicious of staff or peers; patient's interaction with staff and peers is appropriate but very minimal and during free time or breaks he goes to his room; ; patient is taking medications as prescribed and tolerating medications; patient is attending all groups and forwarding little information

## 2012-08-06 NOTE — Progress Notes (Signed)
Recreation Therapy Notes  Date: 05.12.2014  Time: 9:30am  Location: [address] Day Room   Group Topic/Focus: Decision Making   Participation Level:  Active   Participation Quality:  Appropriate   Affect:  Euthymic   Cognitive:  Oriented   Additional Comments: Activity: If ; Explanation: Patients were asked an "If" question, for example If you could travel anywhere in the world where would that be?"   Patient participated in group activity. Patient was asked "If you could live in any period of history, when would it be?" Patient took time to think carefully about his answer. Patient responded "with the Indian[place]." Patient stated he wants to live during this time because he wants to be part of the "original people."   [employer], LRT/CTRS  Jearl Klinefelter 08/06/2012 12:47 PM

## 2012-08-06 NOTE — Progress Notes (Signed)
Psychoeducational Group Note  Date:  08/06/2012 Time:  2000  Group Topic/Focus:  Wrap-Up Group:   The focus of this group is to help patients review their daily goal of treatment and discuss progress on daily workbooks.  Participation Level: Did Not Attend  Participation Quality:  Not Applicable  Affect:  Not Applicable  Cognitive:  Not Applicable  Insight:  Not Applicable  Engagement in Group: Not Applicable  Additional Comments:  Pt did not attend  Christ Kick 08/06/2012, 9:09 PM
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
## 2012-08-06 NOTE — Clinical Social Work Note (Signed)
  Type of Therapy: Process Group Therapy  Participation Level:  Active  Participation Quality:  Attentive  Affect:  Appropriate  Cognitive:  Oriented  Insight:  Improving  Engagement in Group:  Engaged  Engagement in Therapy:  Limited  Modes of Intervention:  Activity, Clarification, Education, Problem-solving and Support  Summary of Progress/Problems: Today's group addressed the issue of overcoming obstacles.  Patients were asked to identify their biggest obstacle post d/c that stands in the way of their on-going success, and then problem solve as to how to manage this.  Breylon identified staying productive as his biggest challenge.  He works mowing yards and washing cars, but gets sidetracked by negative friends who talk him into drinking and drugging.  He feels optimistic about this, though, because he is moving to [place] to be closer to family, and thinks that this will help him focus on the positive.       Daryel Gerald B 08/06/2012   2:07 PM
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
## 2012-08-06 NOTE — Progress Notes (Signed)
Patient ID: Keith Murray, male   DOB: 03-04-1961, 52 y.o.   MRN: [id number]  D: Patient stated he is doing well. Pt mood/affect is anxious otherwise appropriate. Pt denies SI/HI/AVH. Pt denies any needs or concerns.  Cooperative with assessment. No acute distressed noted at this time.   A: Met with pt 1:1. Medications administered as prescribed. Writer encouraged pt to discuss feelings. Pt encouraged to come to staff with any question or concerns. 15 minutes checks for safety.  R: Patient remains safe. He is complaint with medications and denies any adverse reaction. Continue current POC.

## 2012-08-06 NOTE — Progress Notes (Signed)
Adult Psychoeducational Group Note  Date:  08/06/2012 Time:  11:50 AM  Group Topic/Focus:  Self Care:   The focus of this group is to help patients understand the importance of self-care in order to improve or restore emotional, physical, spiritual, interpersonal, and financial health.  Participation Level:  Active  Participation Quality:  Appropriate, Sharing and Supportive  Affect:  Appropriate  Cognitive:  Appropriate  Insight: Appropriate  Engagement in Group:  Supportive  Modes of Intervention:  Discussion, Education and Support  Additional Comments:  Pt was appropriate.  Isla Pence M 08/06/2012, 11:50 AM

## 2012-08-07 ENCOUNTER — Encounter (HOSPITAL_COMMUNITY): Payer: Self-pay | Admitting: Registered Nurse

## 2012-08-07 NOTE — Progress Notes (Signed)
Patient ID: Keith Murray, male   DOB: 1960/11/19, 52 y.o.   MRN: [id number]  D: Pt denies SI/HI/AVH and denies pain at this time. Pt is pleasant and cooperative. Pt very animated, but did get out of bed, and interacted in the dayroom with other pts.   A: Pt was offered support and encouragement. Pt was given scheduled medications. Pt was encourage to attend groups. Q 15 minute checks were done for safety.   R:Pt did not attend groups, but interacts  with peers and staff. Pt is taking medication. Pt has no complaints at this time.Pt receptive to treatment and safety maintained on unit.

## 2012-08-07 NOTE — Clinical Social Work Note (Signed)
[facility] LCSW Group Therapy  08/07/2012 , 2:07 PM   Type of Therapy:  Group Therapy  Participation Level:  Active  Participation Quality:  Attentive  Affect:  Appropriate  Cognitive:  Alert  Insight:  Improving  Engagement in Therapy:  Engaged  Modes of Intervention:  Discussion, Exploration and Socialization  Summary of Progress/Problems: Today's group focused on the term Diagnosis.  Participants were asked to define the term, and then pronounce whether it is a negative, positive or neutral term. The patients engaged in an animated conversation about specific concepts of labels and stereotypes, self determination, gaining information and knowledge and focus.  Caysen specifically talked about the importance of ignoring the ignorant comments of others, and staying focused on "doing the right thing."  Daryel Gerald B 08/07/2012 , 2:07 PM

## 2012-08-07 NOTE — [facility] Group Notes (Signed)
[facility] LCSW Aftercare Discharge Planning Group Note   08/07/2012 10:32 AM  Participation Quality:  Engaged  Mood/Affect:  Appropriate  Depression Rating:  denies  Anxiety Rating:  denies  Thoughts of Suicide:  No Will you contract for safety?   NA  Current AVH:  No  Plan for Discharge/Comments:  Told patient that I had talked with his niece yesterday, and the expectation for him to move into his new place in [place] is that he go to rehab on 5/28  At [facility].  He had to think about this, but eventually agreed that he would be willing to do that.  Transportation Means: neice  Supports: neice  [place], [place] B

## 2012-08-07 NOTE — Progress Notes (Signed)
Patient ID: Keith Murray, male   DOB: 1960/04/24, 52 y.o.   MRN: [id number] [facility] MD Progress Note  08/07/2012 8:40 AM Keith Murray  MRN:  [id number]  Subjective: "My niece is nothing but trouble; she just keep calling the cops and I get thrown out; I got a place [facility] and I don't need them in my life; I don't need the drama; Now I got to go to 28 day program at the end of this month.  If it happens again something is going to happen.  Patient states that he has been going through this all his life with hospitals and cops and states that he wants them to just leave him a lone.  States that the next time they show up at his door he is going to take out a warrant on them.    Objective: Patient states that he doesn't have a problem that it is his family that has the problem coming to his door with the cops.  Patient states that he is never hurting anyone of doing anything.  For this incident he was just packing his clothing to go to another place when his niece and family showed up with the cops.   Diagnosis:   Axis I: Psychotic disorder Axis II: Deferred Axis III:  Past Medical History  Diagnosis Date  . Mental disorder    Axis IV: other psychosocial or environmental problems Axis V: 41-50 serious symptoms  ADL's:  Intact  Sleep: Fair  Appetite:  Good  Suicidal Ideation:  Plan:  Denies Intent:  denies Means: denies Homicidal Ideation:  Plan:  Denies Intent:  Denies Means:  Denies  AEB (as evidenced by): Patient continues to participate in group sessions and tolerating medication without adverse effects.     Psychiatric Specialty Exam: Review of Systems  Psychiatric/Behavioral: Positive for hallucinations (Currentlly being stabilized with medication). Negative for depression and suicidal ideas. The patient has insomnia.   All other systems reviewed and are negative.    Blood pressure 117/77, pulse 73, temperature 97.6 F (36.4 C), temperature source Oral, resp. rate 18,  height 6\' 1"  (1.854 m), weight 69.4 kg (153 lb), SpO2 100.00%.Body mass index is 20.19 kg/(m^2).  General Appearance: Casual and Fairly Groomed  Patent attorney::  Good  Speech:  Clear and Coherent  Volume:  Normal  Mood:  irritable  Affect:  Appropriate  Thought Process:  Coherent, Goal Directed and Intact  Orientation:  Full (Time, Place, and Person)  Thought Content:  Rumination  Suicidal Thoughts:  No  Homicidal Thoughts:  No  Memory:  Immediate;   Fair Recent;   Fair Remote;   Fair  Judgement:  marginal  Insight:  marginal  Psychomotor Activity:  Normal  Concentration:  Fair  Recall:  Fair  Akathisia:  No  Handed:  Right  AIMS (if indicated):     Assets:  Desire for Improvement  Sleep:  Number of Hours: 3.5   Current Medications: Current Facility-Administered Medications  Medication Dose Route Frequency Provider Last Rate Last Dose  . acetaminophen (TYLENOL) tablet 650 mg  650 mg Oral Q6H PRN Giles Currie, NP   650 mg at 08/06/12 2026  . alum & mag hydroxide-simeth (MAALOX/MYLANTA) 200-200-20 MG/5ML suspension 30 mL  30 mL Oral Q4H PRN Aylssa Herrig, NP      . benztropine (COGENTIN) tablet 0.5 mg  0.5 mg Oral BID Nanine Means, NP   0.5 mg at 08/07/12 0753  . haloperidol (HALDOL) tablet 5 mg  5 mg Oral  BID Nanine Means, NP   5 mg at 08/07/12 0753  . LORazepam (ATIVAN) tablet 1 mg  1 mg Oral Q8H PRN Jabree Rebert, NP   1 mg at 08/05/12 2111  . magnesium hydroxide (MILK OF MAGNESIA) suspension 30 mL  30 mL Oral Daily PRN Acadia Thammavong, NP      . nicotine (NICODERM CQ - dosed in mg/24 hours) patch 21 mg  21 mg Transdermal Daily Rena Sweeden, NP   21 mg at 08/07/12 0657  . traZODone (DESYREL) tablet 100 mg  100 mg Oral QHS Nanine Means, NP   100 mg at 08/06/12 2126  . ziprasidone (GEODON) injection 20 mg  20 mg Intramuscular Q6H PRN Itha Kroeker, NP        Lab Results: No results found for this or any previous visit (from the past 48 hour(s)).  Physical Findings: AIMS:  Facial and Oral Movements Muscles of Facial Expression: None, normal Lips and Perioral Area: None, normal Jaw: None, normal Tongue: None, normal,Extremity Movements Upper (arms, wrists, hands, fingers): None, normal Lower (legs, knees, ankles, toes): None, normal, Trunk Movements Neck, shoulders, hips: None, normal, Overall Severity Severity of abnormal movements (highest score from questions above): None, normal Incapacitation due to abnormal movements: None, normal Patient's awareness of abnormal movements (rate only patient's report): No Awareness, Dental Status Current problems with teeth and/or dentures?: No Does patient usually wear dentures?: No  CIWA:    COWS:     Treatment Plan Summary: Daily contact with patient to assess and evaluate symptoms and progress in treatment Medication management  Plan: Supportive approach/coping skills/Stabilization. Encouraged out of room, participation in group sessions and application of coping skills when distressed. Will continue to monitor response to/adverse effects of medications in use to assure effectiveness. Continue to monitor mood, behavior and interaction with staff and other patients. Continue current medication regimen. Will continue current plan and treatment.  Medical Decision Making Problem Points:  Established problem, stable/improving (1), Review of last therapy session (1) and Review of psycho-social stressors (1) Data Points:  Review or order clinical lab tests (1) Review of medication regiment & side effects (2) Review of new medications or change in dosage (2)  I certify that inpatient services furnished can reasonably be expected to improve the patient's condition.  Analaura Messler B. Kamareon Sciandra FNP-BC Family Nurse Practitioner, Board Certified   Brent Noto, Denice Bors, FNP-BC 08/07/2012, 8:40 AM

## 2012-08-07 NOTE — Progress Notes (Signed)
D: Patient denies SI/HI and A/V hallucinations; patient reports sleep is well; reports appetite is good; reports energy level is normal ; reports ability to pay attention is improving; rates depression as 10/10; rates hopelessness 10/10; rates anxiety as 0/10 but this information is based up the self inventory; patient is denying any feelings of hopelessness, depression, and anxiety verbally and not showing signs of depression or hopelessness on the unit  A: Monitored q 15 minutes; patient encouraged to attend groups; patient educated about medications; patient given medications per physician orders; patient encouraged to express feelings and/or concerns  R: Patient is animated and laughing and joking on the unit ; patient is cooperative and willing to participate; patient's interaction with staff and peers is appropriate; patient was able to set goal to talk with staff 1:1 when having feelings of SI; patient is taking medications as prescribed and tolerating medications; patient is attending all groups and engaging with the the staff and peers

## 2012-08-08 DIAGNOSIS — F1920 Other psychoactive substance dependence, uncomplicated: Secondary | ICD-10-CM

## 2012-08-08 DIAGNOSIS — F259 Schizoaffective disorder, unspecified: Secondary | ICD-10-CM

## 2012-08-08 DIAGNOSIS — F1910 Other psychoactive substance abuse, uncomplicated: Secondary | ICD-10-CM

## 2012-08-08 NOTE — Progress Notes (Signed)
Adult Psychoeducational Group Note  Date:  08/08/2012 Time:  11:00AM Group Topic/Focus:  Personal Choices and Values:   The focus of this group is to help patients assess and explore the importance of values in their lives, how their values affect their decisions, how they express their values and what opposes their expression.  Participation Level:  Active  Participation Quality:  Appropriate and Attentive  Affect:  Appropriate  Cognitive:  Alert and Appropriate  Insight: Appropriate  Engagement in Group:  Engaged  Modes of Intervention:  Discussion  Additional Comments:  Pt. Was attentive and appropriate during today's group discussion. Pt. Shared that he is feeling better and is now prepared for discharged. Pt. Is working on building a support system and learning how to communicate with family. Pt. Stated his medication is working and plan on continuing med management.   Bing Plume D 08/08/2012, 5:17 PM

## 2012-08-08 NOTE — Progress Notes (Signed)
Pt observed in the dayroom watching TV and interacting appropriately with his peers.  Pt reports he has had a good day and is looking forward to his discharge tomorrow.  He says he is going to a 28 day program and feels this will help him stay sober.  He plans to move to an apartmentt his niece found for him in [place].  His mood is "up-beat" and positive.  He denies SI/HI/AV.  He voices no needs at this time, but inquires when he gets meds again.  Spent 1:1 time with pt.  Support and encouragement offered.  Pt makes his needs known to staff.  Safety maintained with q15 minute checks.

## 2012-08-08 NOTE — [facility] Suicide Risk Assessment (Signed)
[facility] INPATIENT:  Family/Significant Other Suicide Prevention Education  Suicide Prevention Education:  Education Completed; Nadeen Landau, niece, [phone] has been identified by the patient as the family member/significant other with whom the patient will be residing, and identified as the person(s) who will aid the patient in the event of a mental health crisis (suicidal ideations/suicide attempt).  With written consent from the patient, the family member/significant other has been provided the following suicide prevention education, prior to the and/or following the discharge of the patient.  The suicide prevention education provided includes the following:  Suicide risk factors  Suicide prevention and interventions  National Suicide Hotline telephone number  [facility] assessment telephone number  [facility] Emergency Assistance 911  [facility] and/or Residential Mobile Crisis Unit telephone number  Request made of family/significant other to:  Remove weapons (e.g., guns, rifles, knives), all items previously/currently identified as safety concern.    Remove drugs/medications (over-the-counter, prescriptions, illicit drugs), all items previously/currently identified as a safety concern.  The family member/significant other verbalizes understanding of the suicide prevention education information provided.  The family member/significant other agrees to remove the items of safety concern listed above.  Daryel Gerald B 08/08/2012, 3:23 PM

## 2012-08-08 NOTE — Progress Notes (Signed)
The focus of this group is to help patients review their daily goal of treatment and discuss progress on daily workbooks. Pt attended the evening group session and responded to all discussion prompts. Pt reported having a good day today and being especially happy about being discharged tomorrow. Pt appeared more bright than in previous groups and smiled throughout the session. Pt also made supportive comments to his peers. Pt stated his only need from staff tonight was to do his laundry.

## 2012-08-08 NOTE — Progress Notes (Signed)
Recreation Therapy Notes  Date: 05.14.2014 Time: 9:30am Location: [address] Day Room      Group Topic/Focus: Memory  Participation Level: Active  Participation Quality: Appropriate  Affect: Euthymic to Bright  Cognitive: Oriented   Additional Comments: Activity: Memory Sequence ; Explanation: Patients with LRT and nursing student created a sequence of actions. Each patient had the opportunity to add an action to the sequence once the sequence had traveled around the group one time. Group created the following sequence: Pull ears, clap one, stomp feet twice, clap once, tap knees, kick one leg forward, cross legs in front of each other.  Patient actively participated in group activity. Patient was able to complete sequence created until it contained more than two actions. Patient tolerated direction from LRT and peers. Patient smiled throughout group session. Peer informed LRT patient was being d/c today, patient smiled brightly and stated this was true. Patient stated he is very excited to go home.   Marykay Lex Macyn Shropshire, LRT/CTRS  Jearl Klinefelter 08/08/2012 12:14 PM

## 2012-08-08 NOTE — Clinical Social Work Note (Signed)
[facility] [employer] Group Therapy  08/08/2012 , 1:21 PM    Type of Therapy:  M[employer] Presentation  Participation Level:  Active  Participation Quality:  Attentive  Affect:  Blunted  Cognitive:  Oriented  Insight:  Limited  Engagement in Therapy:  Engaged  Modes of Intervention:  Discussion, Education and Socialization  Summary of Progress/Problems:  Onalee Hua from [employer] came to present his recovery story and play the guitar.  Javarian listened attentively throughout.  He enjoyed the music.  Daryel Gerald B 08/08/2012 , 1:21 PM

## 2012-08-08 NOTE — Progress Notes (Deleted)
Recreation Therapy Notes  Date: 05.14.2014 Time: 9:30am Location: [address] Day Room      Group Topic/Focus: Memory  Participation Level: Did not attend  Jearl Klinefelter, LRT/CTRS  Jearl Klinefelter 08/08/2012 12:16 PM

## 2012-08-08 NOTE — [facility] Group Notes (Signed)
[facility] LCSW Aftercare Discharge Planning Group Note   08/08/2012 8:07 AM  Participation Quality:  Did not attend    [place]

## 2012-08-08 NOTE — Progress Notes (Signed)
D: Patient denies SI/HI/AVH. Pt denies pain and show no s/s of distress. Pt is less anxious today, compliant with taking meds an attend groups. Per pt, once he is discharged he will stay sober and not have people around him who uses drugs. A: Medications administered as ordered per MD. Verbal support given. Pt encouraged to attend groups. 15 minute checks performed for safety. R: Pt is pleasant and cooperative today.

## 2012-08-08 NOTE — Progress Notes (Signed)
Patient ID: Keith Murray, male   DOB: Aug 01, 1960, 52 y.o.   MRN: [id number] [facility] MD Progress Note  08/08/2012 10:34 AM Kypton Eltringham  MRN:  [id number]  Subjective: " I am doing much better today". Objective: Patient reports decreased agitation and mood lability. He denies psychosis, delusions and craving for drugs. Patient reports long history of Cocaine, Marijuana and alcohol abuse. He is now willing to go to [facility] for  drug and alcohol rehabilitation. Social Investment banker, operational is also referring him to [facility] for medication management. He also reports that his niece has found an apartment for him in [place], [place]. He can no longer live with her due to his drug,alcohol use and aggressive behavior when intoxicated. Patient denies any adverse reactions to his medications.  Diagnosis:   Axis I: Schizoaffective disorder            Polysubstance abuse Axis II: Deferred Axis III:  Past Medical History  Diagnosis Date  . Mental disorder    Axis IV: other psychosocial or environmental problems Axis V: 51-60 serious symptoms  ADL's:  Intact  Sleep: Fair  Appetite:  Good  Suicidal Ideation:  Plan:  Denies Intent:  denies Means: denies Homicidal Ideation:  Plan:  Denies Intent:  Denies Means:  Denies  AEB (as evidenced by): Patient continues to participate in group sessions and tolerating medication without adverse effects.     Psychiatric Specialty Exam: Review of Systems  Constitutional: Negative.   HENT: Negative.   Eyes: Negative.   Respiratory: Negative.   Cardiovascular: Negative.   Gastrointestinal: Negative.   Genitourinary: Negative.   Musculoskeletal: Negative.   Skin: Negative.   Neurological: Negative.   Endo/Heme/Allergies: Negative.   Psychiatric/Behavioral: Negative.  Negative for depression and suicidal ideas. Hallucinations: Currentlly being stabilized with medication.  All other systems reviewed and are negative.    Blood pressure 98/60, pulse 74,  temperature 98.1 F (36.7 C), temperature source Oral, resp. rate 20, height 6\' 1"  (1.854 m), weight 69.4 kg (153 lb), SpO2 100.00%.Body mass index is 20.19 kg/(m^2).  General Appearance: Casual and Fairly Groomed  Patent attorney::  Good  Speech:  Clear and Coherent  Volume:  Normal  Mood:  irritable  Affect:  Appropriate  Thought Process:  Coherent, Goal Directed and Intact  Orientation:  Full (Time, Place, and Person)  Thought Content:  Reality based  Suicidal Thoughts:  No  Homicidal Thoughts:  No  Memory:  Immediate;   Fair Recent;   Fair Remote;   Fair  Judgement:  marginal  Insight:  marginal  Psychomotor Activity:  Normal  Concentration:  Fair  Recall:  Fair  Akathisia:  No  Handed:  Right  AIMS (if indicated):     Assets:  Desire for Improvement  Sleep:  Number of Hours: 5   Current Medications: Current Facility-Administered Medications  Medication Dose Route Frequency Provider Last Rate Last Dose  . acetaminophen (TYLENOL) tablet 650 mg  650 mg Oral Q6H PRN Shuvon Rankin, NP   650 mg at 08/07/12 1518  . alum & mag hydroxide-simeth (MAALOX/MYLANTA) 200-200-20 MG/5ML suspension 30 mL  30 mL Oral Q4H PRN Shuvon Rankin, NP      . benztropine (COGENTIN) tablet 0.5 mg  0.5 mg Oral BID Nanine Means, NP   0.5 mg at 08/08/12 0814  . haloperidol (HALDOL) tablet 5 mg  5 mg Oral BID Nanine Means, NP   5 mg at 08/08/12 0814  . LORazepam (ATIVAN) tablet 1 mg  1 mg Oral Q8H PRN  Shuvon Rankin, NP   1 mg at 08/05/12 2111  . magnesium hydroxide (MILK OF MAGNESIA) suspension 30 mL  30 mL Oral Daily PRN Shuvon Rankin, NP      . nicotine (NICODERM CQ - dosed in mg/24 hours) patch 21 mg  21 mg Transdermal Daily Shuvon Rankin, NP   21 mg at 08/08/12 0700  . traZODone (DESYREL) tablet 100 mg  100 mg Oral QHS Nanine Means, NP   100 mg at 08/07/12 2139  . ziprasidone (GEODON) injection 20 mg  20 mg Intramuscular Q6H PRN Shuvon Rankin, NP        Lab Results: No results found for this or any  previous visit (from the past 48 hour(s)).  Physical Findings: AIMS: Facial and Oral Movements Muscles of Facial Expression: None, normal Lips and Perioral Area: None, normal Jaw: None, normal Tongue: None, normal,Extremity Movements Upper (arms, wrists, hands, fingers): None, normal Lower (legs, knees, ankles, toes): None, normal, Trunk Movements Neck, shoulders, hips: None, normal, Overall Severity Severity of abnormal movements (highest score from questions above): None, normal Incapacitation due to abnormal movements: None, normal Patient's awareness of abnormal movements (rate only patient's report): No Awareness, Dental Status Current problems with teeth and/or dentures?: No Does patient usually wear dentures?: No  CIWA:    COWS:     Treatment Plan Summary: Daily contact with patient to assess and evaluate symptoms and progress in treatment Medication management  Plan: Supportive approach/coping skills/Stabilization. Encouraged out of room, participation in group sessions and application of coping skills when distressed. Will continue to monitor response to/adverse effects of medications in use to assure effectiveness. Continue to monitor mood, behavior and interaction with staff and other patients. Continue current medication regimen. Will continue current plan and treatment.  Medical Decision Making Problem Points:  Established problem, stable/improving (1), Review of last therapy session (1) and Review of psycho-social stressors (1) Data Points:  Review or order clinical lab tests (1) Review of medication regiment & side effects (2) Review of new medications or change in dosage (2)  I certify that inpatient services furnished can reasonably be expected to improve the patient's condition.    Thedore Mins, MD 08/08/2012, 10:34 AM

## 2012-08-09 DIAGNOSIS — F1920 Other psychoactive substance dependence, uncomplicated: Secondary | ICD-10-CM

## 2012-08-09 MED ORDER — TRAZODONE HCL 100 MG PO TABS: 100.0000 mg | ORAL_TABLET | Freq: Every day | ORAL | Status: DC

## 2012-08-09 MED ORDER — BENZTROPINE MESYLATE 0.5 MG PO TABS: 0.5000 mg | ORAL_TABLET | Freq: Two times a day (BID) | ORAL | Status: DC

## 2012-08-09 MED ORDER — HALOPERIDOL 5 MG PO TABS: 5.0000 mg | ORAL_TABLET | Freq: Two times a day (BID) | ORAL | Status: DC

## 2012-08-09 NOTE — Progress Notes (Addendum)
Patient ID: Keith Murray, male   DOB: [birth year], 52 y.o.   MRN: [id number] Patient to be discharged today per MD order.  Patient will returning home with the plan to admit to [facility] on the 28th.  Patient is eager for treatment.  He denies any SI/HI/AVH.  He has been interacting well in the milieu and with staff.  He is very appreciative for staff's assistance.    Safety checks completed every 15 minutes per protocol.  His behavior has been appropriate.  1500: patient discharged; left ambulatory with his niece.

## 2012-08-09 NOTE — [facility] Suicide Risk Assessment (Signed)
Suicide Risk Assessment  Discharge Assessment     Demographic Factors:  Male, Low socioeconomic status and Living alone  Mental Status Per Nursing Assessment::   On Admission:     Current Mental Status by Physician: patient denies suicidal ideation,intent or plan  Loss Factors: Financial problems/change in socioeconomic status  Historical Factors: Family history of mental illness or substance abuse and Impulsivity  Risk Reduction Factors:   Living with another person, especially a relative, Positive social support and Positive therapeutic relationship  Continued Clinical Symptoms:  Alcohol/Substance Abuse/Dependencies  Cognitive Features That Contribute To Risk:  Closed-mindedness Polarized thinking    Suicide Risk:  Minimal: No identifiable suicidal ideation.  Patients presenting with no risk factors but with morbid ruminations; may be classified as minimal risk based on the severity of the depressive symptoms  Discharge Diagnoses:   AXIS I:  Schizoaffective disorder              Polysubstance abuse  AXIS II:  Deferred AXIS III:   Past Medical History  Diagnosis Date  . Mental disorder    AXIS IV:  other psychosocial or environmental problems and problems related to social environment AXIS V:  61-70 mild symptoms  Plan Of Care/Follow-up recommendations:  Activity:  as tolerated Diet:  healthy Tests:  routine blood test Other:  patient to keep his follow up appointment  Is patient on multiple antipsychotic therapies at discharge:  No   Has Patient had three or more failed trials of antipsychotic monotherapy by history:  No  Recommended Plan for Multiple Antipsychotic Therapies: N/A  Sneijder Bernards,MD 08/09/2012, 9:44 AM

## 2012-08-09 NOTE — Discharge Summary (Signed)
Physician Discharge Summary Note  Patient:  Keith Murray is an 52 y.o., male MRN:  [id number] DOB:  Jun 25, 1960 Patient phone:  [phone] (home)  Patient address:   [address] [place] [address],   Date of Admission:  08/03/2012 Date of Discharge: 08/09/2012   Reason for Admission:  Schizoaffective disorder                                           Alcohol abuse                                           Cocaine abuse Discharge Diagnoses: Principal Problem:   Schizoaffective disorder Active Problems:   Psychosis   Polysubstance (excluding opioids) dependence  Review of Systems  Constitutional: Negative.  Negative for fever, chills, weight loss, malaise/fatigue and diaphoresis.  HENT: Negative for congestion and sore throat.   Eyes: Negative for blurred vision, double vision and photophobia.  Respiratory: Negative for cough, shortness of breath and wheezing.   Cardiovascular: Negative for chest pain, palpitations and PND.  Gastrointestinal: Negative for heartburn, nausea, vomiting, abdominal pain, diarrhea and constipation.  Musculoskeletal: Negative for myalgias, joint pain and falls.  Neurological: Negative for dizziness, tingling, tremors, sensory change, speech change, focal weakness, seizures, loss of consciousness, weakness and headaches.  Endo/Heme/Allergies: Negative for polydipsia. Does not bruise/bleed easily.  Psychiatric/Behavioral: Negative for depression, suicidal ideas, hallucinations, memory loss and substance abuse. The patient is not nervous/anxious and does not have insomnia.    Discharge Diagnoses:  AXIS I: Schizoaffective disorder  Polysubstance abuse  AXIS II: Deferred  AXIS III:  Past Medical History   Diagnosis  Date   .  Mental disorder     AXIS IV: other psychosocial or environmental problems and problems related to social environment  AXIS V: 61-70 mild symptoms Level of Care:  OP  Hospital Course:  Williw was admitted under IVC after being  picked up by police and brought to the ED. He was evaluated, he became violent and was placed in 4 point restraints and medicated. After he was more cooperative he was given medical clearance and transferred to [facility] for further stabilization and care. His labs showed mild decline in renal function, and the UDS was + for cocaine and THC. His BAL was 86.       He was admitted to the 400 unit and evaluated. He denied all symptoms but behaved as if responding to internal stimulation and felt to be psychotic.  He was started on Haldol 5mg  BID and cogentin to prevent EPS, and was given Trazodone for sleep.  Initially he was isolative to his room and guarded.  He denied SI/HI and continued to deny AVH. He responded well to the medication and after the second and third days was able to be more receptive to unit programming.       Abdimalik gained more insight as his psychosis become more stable and he was able to work with the CM/LCSW toward a goal of further treatment for his substance abuse. He was able to say that alcohol and drugs had been a factor in his current situation as well as his overall mental health.      Deroy agreed to go to a 28 day program at [facility] upon discharge  from [facility]. On the day of discharge Sequan was in much improved condition with good insight and good judgement. He denied SI/HI and AVH. He no longer demonstrated psychosis and was in much improved condition than upon admission.   Consults:  psychiatry  Significant Diagnostic Studies:  Routine blood work including CMP, Toxicology CBC  Discharge Vitals:   Blood pressure 122/67, pulse 80, temperature 97.6 F (36.4 C), temperature source Oral, resp. rate 17, height 6\' 1"  (1.854 m), weight 69.4 kg (153 lb), SpO2 100.00%. Body mass index is 20.19 kg/(m^2). Lab Results:   No results found for this or any previous visit (from the past 72 hour(s)).  Physical Findings: AIMS: Facial and Oral Movements Muscles of Facial  Expression: None, normal Lips and Perioral Area: None, normal Jaw: None, normal Tongue: None, normal,Extremity Movements Upper (arms, wrists, hands, fingers): None, normal Lower (legs, knees, ankles, toes): None, normal, Trunk Movements Neck, shoulders, hips: None, normal, Overall Severity Severity of abnormal movements (highest score from questions above): None, normal Incapacitation due to abnormal movements: None, normal Patient's awareness of abnormal movements (rate only patient's report): No Awareness, Dental Status Current problems with teeth and/or dentures?: No Does patient usually wear dentures?: No  CIWA:    COWS:     Psychiatric Specialty Exam: See Psychiatric Specialty Exam and Suicide Risk Assessment completed by Attending Physician prior to discharge.  Discharge destination:  Home  Is patient on multiple antipsychotic therapies at discharge:  No   Has Patient had three or more failed trials of antipsychotic monotherapy by history:  No  Recommended Plan for Multiple Antipsychotic Therapies:  Not applicable   Discharge Orders   Future Orders Complete By Expires     Diet - low sodium heart healthy  As directed     Discharge instructions  As directed     Comments:      Take all of your medications as directed. Be sure to keep all of your follow up appointments.  If you are unable to keep your follow up appointment, call your Doctor's office to let them know, and reschedule.  Make sure that you have enough medication to last until your appointment. Be sure to get plenty of rest. Going to bed at the same time each night will help. Try to avoid sleeping during the day.  Increase your activity as tolerated. Regular exercise will help you to sleep better and improve your mental health. Eating a heart healthy diet is recommended. Try to avoid salty or fried foods. Be sure to avoid all alcohol and illegal drugs.    Increase activity slowly  As directed         Medication  List    STOP taking these medications       acetaminophen 325 MG tablet  Commonly known as:  TYLENOL     multivitamin with minerals Tabs      TAKE these medications     Indication   benztropine 0.5 MG tablet  Commonly known as:  COGENTIN  Take 1 tablet (0.5 mg total) by mouth 2 (two) times daily. For side effects.   Indication:  Extrapyramidal Reaction caused by Medications     haloperidol 5 MG tablet  Commonly known as:  HALDOL  Take 1 tablet (5 mg total) by mouth 2 (two) times daily. For mental clarity and psychosis.   Indication:  Psychosis     traZODone 100 MG tablet  Commonly known as:  DESYREL  Take 1 tablet (100 mg total) by mouth  at bedtime. For insomnia.   Indication:  Trouble Sleeping           Follow-up Information   Follow up with [facility] On 08/22/2012. (Arrive at 8AM sharp for your screening for admission.  You must bring 28 days of medication with you to be admitted.  Also, call several days in advance to confirm appointment.  Brennan Bailey is the one who gave me the date.)    Contact information:   [address]  [place] [phone]      Follow up with [facility] team. (Call them on Monday the 19th if you have not Quintanar from anyone about setting up an assessment)    Contact information:   [address]  [place]  They also have a [place] office  [phone]      Follow up with [facility]. (Go to the walk in clinic Mon-Fri between 8 and 9AM for your hospital follow up appointment.  They would not give me an actual appointment since you have not been there since January)    Contact information:   [address] [place]  [place]  [phone]      Follow-up recommendations:   Activities: Resume activity as tolerated. Diet: Heart healthy low sodium diet Tests: Follow up testing will be determined by your out patient provider. Comments:    Total Discharge Time:  Greater than 30 minutes.  Signed: Rona Ravens. Leonardo Plaia RPAC 8:53 AM 08/09/2012

## 2012-08-09 NOTE — Tx Team (Signed)
  Interdisciplinary Treatment Plan Update   Date Reviewed:  08/09/2012  Time Reviewed:  9:58 AM  Progress in Treatment:   Attending groups: Yes Participating in groups: Yes Taking medication as prescribed: Yes  Tolerating medication: Yes Family/Significant other contact made: Yes  Patient understands diagnosis: Yes  Discussing patient identified problems/goals with staff: Yes Medical problems stabilized or resolved: Yes Denies suicidal/homicidal ideation: Yes  In tx team Patient has not harmed self or others: Yes  For review of initial/current patient goals, please see plan of care.  Estimated Length of Stay:  D/C today  Reason for Continuation of Hospitalization:   New Problems/Goals identified:  N/A  Discharge Plan or Barriers:   return home, follow up outpt  Additional Comments:  Attendees:  Signature: Thedore Mins, MD 08/09/2012 9:58 AM   Signature: Richelle Ito, LCSW 08/09/2012 9:58 AM  Signature: Verne Spurr, PA 08/09/2012 9:58 AM  Signature: Joslyn Devon, RN 08/09/2012 9:58 AM  Signature:  08/09/2012 9:58 AM  Signature:  08/09/2012 9:58 AM  Signature:   08/09/2012 9:58 AM  Signature:    Signature:    Signature:    Signature:    Signature:    Signature:      Scribe for Treatment Team:   Richelle Ito, LCSW  08/09/2012 9:58 AM

## 2012-08-14 NOTE — Progress Notes (Addendum)
Patient Discharge Instructions:  After Visit Summary (AVS):   Faxed to:  08/14/12 Discharge Summary Note:   Faxed to:  08/14/12 Psychiatric Admission Assessment Note:   Faxed to:  08/14/12 Suicide Risk Assessment - Discharge Assessment:   Faxed to:  08/14/12 Faxed/Sent to the Next Level Care provider:  08/14/12 Faxed to [facility] @ [phone] Faxed to [facility] @ [phone] Faxed to [facility] @ [phone]  Jerelene Redden, 08/14/2012, 3:22 PM

## 2012-08-15 NOTE — Discharge Summary (Signed)
Seen and agreed. Shariff Lasky, MD 

## 2012-10-07 ENCOUNTER — Encounter (HOSPITAL_COMMUNITY): Payer: Self-pay | Admitting: Registered Nurse

## 2012-10-07 ENCOUNTER — Emergency Department (HOSPITAL_COMMUNITY)
Admission: EM | Admit: 2012-10-07 | Discharge: 2012-10-07 | Disposition: A | Discharge status: Other Acute Inpatient Hospital | Payer: 59 | Attending: Emergency Medicine | Admitting: Emergency Medicine

## 2012-10-07 ENCOUNTER — Inpatient Hospital Stay (HOSPITAL_COMMUNITY)
Admission: AD | Admit: 2012-10-07 | Discharge: 2012-10-11 | DRG: 885 | Disposition: A | Discharge status: Home / Self Care | Payer: Federal, State, Local not specified - Other | Source: Intra-hospital | Attending: Psychiatry | Admitting: Psychiatry

## 2012-10-07 ENCOUNTER — Encounter (HOSPITAL_COMMUNITY): Payer: Self-pay | Admitting: *Deleted

## 2012-10-07 DIAGNOSIS — F489 Nonpsychotic mental disorder, unspecified: Secondary | ICD-10-CM | POA: Insufficient documentation

## 2012-10-07 DIAGNOSIS — F1410 Cocaine abuse, uncomplicated: Secondary | ICD-10-CM | POA: Diagnosis present

## 2012-10-07 DIAGNOSIS — F1210 Cannabis abuse, uncomplicated: Secondary | ICD-10-CM | POA: Insufficient documentation

## 2012-10-07 DIAGNOSIS — F29 Unspecified psychosis not due to a substance or known physiological condition: Secondary | ICD-10-CM

## 2012-10-07 DIAGNOSIS — Z91199 Patient's noncompliance with other medical treatment and regimen due to unspecified reason: Secondary | ICD-10-CM

## 2012-10-07 DIAGNOSIS — F1010 Alcohol abuse, uncomplicated: Secondary | ICD-10-CM | POA: Insufficient documentation

## 2012-10-07 DIAGNOSIS — F259 Schizoaffective disorder, unspecified: Principal | ICD-10-CM | POA: Diagnosis present

## 2012-10-07 DIAGNOSIS — F17200 Nicotine dependence, unspecified, uncomplicated: Secondary | ICD-10-CM | POA: Diagnosis present

## 2012-10-07 DIAGNOSIS — F1920 Other psychoactive substance dependence, uncomplicated: Secondary | ICD-10-CM

## 2012-10-07 DIAGNOSIS — F1910 Other psychoactive substance abuse, uncomplicated: Secondary | ICD-10-CM

## 2012-10-07 DIAGNOSIS — Z79899 Other long term (current) drug therapy: Secondary | ICD-10-CM | POA: Insufficient documentation

## 2012-10-07 DIAGNOSIS — F10920 Alcohol use, unspecified with intoxication, uncomplicated: Secondary | ICD-10-CM

## 2012-10-07 DIAGNOSIS — Z9119 Patient's noncompliance with other medical treatment and regimen: Secondary | ICD-10-CM

## 2012-10-07 LAB — RAPID URINE DRUG SCREEN, HOSP PERFORMED
Amphetamines: NOT DETECTED
Barbiturates: NOT DETECTED
Benzodiazepines: NOT DETECTED
Cocaine: POSITIVE — AB
Opiates: NOT DETECTED
Tetrahydrocannabinol: POSITIVE — AB

## 2012-10-07 LAB — CBC
HCT: 39.1 % (ref 39.0–52.0)
Hemoglobin: 14 g/dL (ref 13.0–17.0)
MCH: 31.3 pg (ref 26.0–34.0)
MCHC: 35.8 g/dL (ref 30.0–36.0)
MCV: 87.5 fL (ref 78.0–100.0)
Platelets: 224 10*3/uL (ref 150–400)
RBC: 4.47 MIL/uL (ref 4.22–5.81)
RDW: 13.2 % (ref 11.5–15.5)
WBC: 8.7 10*3/uL (ref 4.0–10.5)

## 2012-10-07 LAB — COMPREHENSIVE METABOLIC PANEL
ALT: 13 U/L (ref 0–53)
AST: 25 U/L (ref 0–37)
Albumin: 4.4 g/dL (ref 3.5–5.2)
Alkaline Phosphatase: 116 U/L (ref 39–117)
BUN: 31 mg/dL — ABNORMAL HIGH (ref 6–23)
CO2: 19 mEq/L (ref 19–32)
Calcium: 9.2 mg/dL (ref 8.4–10.5)
Chloride: 98 mEq/L (ref 96–112)
Creatinine, Ser: 2 mg/dL — ABNORMAL HIGH (ref 0.50–1.35)
GFR calc Af Amer: 43 mL/min — ABNORMAL LOW (ref >90)
GFR calc non Af Amer: 37 mL/min — ABNORMAL LOW (ref >90)
Glucose, Bld: 98 mg/dL (ref 70–99)
Potassium: 3.6 mEq/L (ref 3.5–5.1)
Sodium: 134 mEq/L — ABNORMAL LOW (ref 135–145)
Total Bilirubin: 0.9 mg/dL (ref 0.3–1.2)
Total Protein: 7.6 g/dL (ref 6.0–8.3)

## 2012-10-07 LAB — ACETAMINOPHEN LEVEL: Acetaminophen (Tylenol), Serum: <15 ug/mL (ref 10–30)

## 2012-10-07 LAB — GLUCOSE, CAPILLARY: Glucose-Capillary: 109 mg/dL — ABNORMAL HIGH (ref 70–99)

## 2012-10-07 LAB — SALICYLATE LEVEL: Salicylate Lvl: <2 mg/dL — ABNORMAL LOW (ref 2.8–20.0)

## 2012-10-07 LAB — ETHANOL: Alcohol, Ethyl (B): 181 mg/dL — ABNORMAL HIGH (ref 0–11)

## 2012-10-07 MED ORDER — CHLORDIAZEPOXIDE HCL 25 MG PO CAPS
25.0000 mg | ORAL_CAPSULE | Freq: Four times a day (QID) | ORAL | Status: AC
  Administered 2012-10-07 – 2012-10-08 (×6): 25 mg via ORAL
  Filled 2012-10-07 (×5): qty 1

## 2012-10-07 MED ORDER — IBUPROFEN 600 MG PO TABS
600.0000 mg | ORAL_TABLET | Freq: Three times a day (TID) | ORAL | Status: DC | PRN
  Administered 2012-10-08: 600 mg via ORAL
  Filled 2012-10-07: qty 1

## 2012-10-07 MED ORDER — TRAZODONE HCL 100 MG PO TABS: 100.0000 mg | ORAL_TABLET | Freq: Every day | ORAL | Status: DC

## 2012-10-07 MED ORDER — HALOPERIDOL 5 MG PO TABS
5.0000 mg | ORAL_TABLET | Freq: Two times a day (BID) | ORAL | Status: DC
  Administered 2012-10-07: 5 mg via ORAL
  Filled 2012-10-07: qty 1

## 2012-10-07 MED ORDER — CHLORDIAZEPOXIDE HCL 25 MG PO CAPS: 25.0000 mg | ORAL_CAPSULE | ORAL | Status: DC

## 2012-10-07 MED ORDER — CHLORDIAZEPOXIDE HCL 25 MG PO CAPS: 25.0000 mg | ORAL_CAPSULE | Freq: Four times a day (QID) | ORAL | Status: DC | PRN

## 2012-10-07 MED ORDER — CHLORDIAZEPOXIDE HCL 25 MG PO CAPS
25.0000 mg | ORAL_CAPSULE | ORAL | Status: AC
  Administered 2012-10-10 (×2): 25 mg via ORAL
  Filled 2012-10-07 (×2): qty 1

## 2012-10-07 MED ORDER — LOPERAMIDE HCL 2 MG PO CAPS: 2.0000 mg | ORAL_CAPSULE | ORAL | Status: AC | PRN

## 2012-10-07 MED ORDER — CHLORDIAZEPOXIDE HCL 25 MG PO CAPS: 25.0000 mg | ORAL_CAPSULE | Freq: Every day | ORAL | Status: DC

## 2012-10-07 MED ORDER — IBUPROFEN 200 MG PO TABS
600.0000 mg | ORAL_TABLET | Freq: Three times a day (TID) | ORAL | Status: DC | PRN
  Administered 2012-10-07: 600 mg via ORAL
  Filled 2012-10-07: qty 3

## 2012-10-07 MED ORDER — CHLORDIAZEPOXIDE HCL 25 MG PO CAPS
25.0000 mg | ORAL_CAPSULE | Freq: Every day | ORAL | Status: AC
  Administered 2012-10-11: 25 mg via ORAL
  Filled 2012-10-07: qty 1

## 2012-10-07 MED ORDER — ONDANSETRON HCL 4 MG PO TABS: 4.0000 mg | ORAL_TABLET | Freq: Three times a day (TID) | ORAL | Status: DC | PRN

## 2012-10-07 MED ORDER — CHLORDIAZEPOXIDE HCL 25 MG PO CAPS
25.0000 mg | ORAL_CAPSULE | Freq: Three times a day (TID) | ORAL | Status: AC
  Administered 2012-10-09 (×3): 25 mg via ORAL
  Filled 2012-10-07 (×3): qty 1

## 2012-10-07 MED ORDER — ONDANSETRON 4 MG PO TBDP: 4.0000 mg | ORAL_TABLET | Freq: Four times a day (QID) | ORAL | Status: AC | PRN

## 2012-10-07 MED ORDER — ALUM & MAG HYDROXIDE-SIMETH 200-200-20 MG/5ML PO SUSP: 30.0000 mL | ORAL | Status: DC | PRN

## 2012-10-07 MED ORDER — HYDROXYZINE HCL 25 MG PO TABS: 25.0000 mg | ORAL_TABLET | Freq: Four times a day (QID) | ORAL | Status: DC | PRN

## 2012-10-07 MED ORDER — BENZTROPINE MESYLATE 0.5 MG PO TABS
0.5000 mg | ORAL_TABLET | Freq: Two times a day (BID) | ORAL | Status: DC
  Administered 2012-10-07 – 2012-10-11 (×9): 0.5 mg via ORAL
  Filled 2012-10-07 (×2): qty 1
  Filled 2012-10-07: qty 6
  Filled 2012-10-07 (×5): qty 1
  Filled 2012-10-07: qty 6
  Filled 2012-10-07 (×4): qty 1

## 2012-10-07 MED ORDER — CHLORDIAZEPOXIDE HCL 25 MG PO CAPS
25.0000 mg | ORAL_CAPSULE | Freq: Once | ORAL | Status: AC
  Administered 2012-10-07: 25 mg via ORAL
  Filled 2012-10-07: qty 1

## 2012-10-07 MED ORDER — MAGNESIUM HYDROXIDE 400 MG/5ML PO SUSP: 30.0000 mL | Freq: Every day | ORAL | Status: DC | PRN

## 2012-10-07 MED ORDER — CHLORDIAZEPOXIDE HCL 25 MG PO CAPS
25.0000 mg | ORAL_CAPSULE | Freq: Four times a day (QID) | ORAL | Status: AC | PRN
  Filled 2012-10-07: qty 1

## 2012-10-07 MED ORDER — BENZTROPINE MESYLATE 1 MG PO TABS
0.5000 mg | ORAL_TABLET | Freq: Two times a day (BID) | ORAL | Status: DC
  Administered 2012-10-07: 0.5 mg via ORAL
  Filled 2012-10-07: qty 1

## 2012-10-07 MED ORDER — ACETAMINOPHEN 325 MG PO TABS: 650.0000 mg | ORAL_TABLET | Freq: Four times a day (QID) | ORAL | Status: DC | PRN

## 2012-10-07 MED ORDER — NICOTINE 21 MG/24HR TD PT24
21.0000 mg | MEDICATED_PATCH | Freq: Every day | TRANSDERMAL | Status: DC
  Administered 2012-10-08 – 2012-10-11 (×4): 21 mg via TRANSDERMAL
  Filled 2012-10-07 (×7): qty 1

## 2012-10-07 MED ORDER — HYDROXYZINE HCL 25 MG PO TABS: 25.0000 mg | ORAL_TABLET | Freq: Four times a day (QID) | ORAL | Status: AC | PRN

## 2012-10-07 MED ORDER — TRAZODONE HCL 100 MG PO TABS
100.0000 mg | ORAL_TABLET | Freq: Every day | ORAL | Status: DC
  Administered 2012-10-07 – 2012-10-10 (×4): 100 mg via ORAL
  Filled 2012-10-07 (×4): qty 1
  Filled 2012-10-07: qty 3
  Filled 2012-10-07 (×2): qty 1

## 2012-10-07 MED ORDER — LORAZEPAM 1 MG PO TABS: 1.0000 mg | ORAL_TABLET | Freq: Three times a day (TID) | ORAL | Status: DC | PRN

## 2012-10-07 MED ORDER — LOPERAMIDE HCL 2 MG PO CAPS: 2.0000 mg | ORAL_CAPSULE | ORAL | Status: DC | PRN

## 2012-10-07 MED ORDER — HALOPERIDOL 5 MG PO TABS
5.0000 mg | ORAL_TABLET | Freq: Two times a day (BID) | ORAL | Status: DC
  Administered 2012-10-07 – 2012-10-11 (×9): 5 mg via ORAL
  Filled 2012-10-07 (×10): qty 1
  Filled 2012-10-07: qty 6
  Filled 2012-10-07: qty 1
  Filled 2012-10-07: qty 6

## 2012-10-07 MED ORDER — VITAMIN B-1 100 MG PO TABS
100.0000 mg | ORAL_TABLET | Freq: Every day | ORAL | Status: DC
  Filled 2012-10-07: qty 1

## 2012-10-07 MED ORDER — CHLORDIAZEPOXIDE HCL 25 MG PO CAPS: 25.0000 mg | ORAL_CAPSULE | Freq: Three times a day (TID) | ORAL | Status: DC

## 2012-10-07 MED ORDER — CHLORDIAZEPOXIDE HCL 25 MG PO CAPS
25.0000 mg | ORAL_CAPSULE | Freq: Four times a day (QID) | ORAL | Status: DC
  Administered 2012-10-07: 50 mg via ORAL
  Filled 2012-10-07: qty 1

## 2012-10-07 MED ORDER — NICOTINE 21 MG/24HR TD PT24
21.0000 mg | MEDICATED_PATCH | Freq: Every day | TRANSDERMAL | Status: DC
  Filled 2012-10-07: qty 1

## 2012-10-07 MED ORDER — LORAZEPAM 2 MG/ML IJ SOLN
2.0000 mg | Freq: Once | INTRAMUSCULAR | Status: AC
  Administered 2012-10-07: 2 mg via INTRAMUSCULAR
  Filled 2012-10-07: qty 1

## 2012-10-07 MED ORDER — ONDANSETRON 4 MG PO TBDP: 4.0000 mg | ORAL_TABLET | Freq: Four times a day (QID) | ORAL | Status: DC | PRN

## 2012-10-07 MED ORDER — THIAMINE HCL 100 MG/ML IJ SOLN: 100.0000 mg | Freq: Once | INTRAMUSCULAR | Status: DC

## 2012-10-07 MED ORDER — ADULT MULTIVITAMIN W/MINERALS CH
1.0000 | ORAL_TABLET | Freq: Every day | ORAL | Status: DC
  Administered 2012-10-08 – 2012-10-11 (×4): 1 via ORAL
  Filled 2012-10-07 (×6): qty 1

## 2012-10-07 MED ORDER — ADULT MULTIVITAMIN W/MINERALS CH
1.0000 | ORAL_TABLET | Freq: Every day | ORAL | Status: DC
  Administered 2012-10-07: 1 via ORAL
  Filled 2012-10-07: qty 1

## 2012-10-07 MED ORDER — VITAMIN B-1 100 MG PO TABS
100.0000 mg | ORAL_TABLET | Freq: Every day | ORAL | Status: DC
  Administered 2012-10-08 – 2012-10-11 (×4): 100 mg via ORAL
  Filled 2012-10-07 (×6): qty 1

## 2012-10-07 NOTE — Tx Team (Signed)
Initial Interdisciplinary Treatment Plan  PATIENT STRENGTHS: (choose at least two) General fund of knowledge Supportive family/friends  PATIENT STRESSORS: Financial difficulties Medication change or noncompliance Substance abuse   PROBLEM LIST: Problem List/Patient Goals Date to be addressed Date deferred Reason deferred Estimated date of resolution  Non compliance  10-07-12           Schizoaffective d/o  10-07-12           Substance abuse/depence 10-07-12                              DISCHARGE CRITERIA:  Ability to meet basic life and health needs Improved stabilization in mood, thinking, and/or behavior Verbal commitment to aftercare and medication compliance Withdrawal symptoms are absent or subacute and managed without 24-hour nursing intervention  PRELIMINARY DISCHARGE PLAN: Attend aftercare/continuing care group  PATIENT/FAMIILY INVOLVEMENT: This treatment plan has been presented to and reviewed with the patient, Ryzen Deady, and/or family member, .  The patient and family have been given the opportunity to ask questions and make suggestions.  Valente David 10/07/2012, 3:01 PM

## 2012-10-07 NOTE — Progress Notes (Signed)
Found pt lying in bed during evening wrap up group. Pt states he's not ready to go now but will do so tomorrow. Reports he's relieved he's here so that "I can get myself together." Pt still vague in his answers in regards to his hx. Denying any withdrawal though noticeable sweating evident on bed sheets/face. CIWA is a 3, VSS. Supported, encouraged. Medicated per orders and given snack. Denies SI/HI/AVH and remains safe. Lawrence Marseilles

## 2012-10-07 NOTE — ED Notes (Signed)
Pt arrived via EMS with a chief complaint of medical clearance.  Per EMS pt found by [facility] department in the middle of the road in his underwear. Pt's nephew would only tell the sheriff that he takes shots.

## 2012-10-07 NOTE — Consult Note (Signed)
Reason for Consult: Evaluation for inpatient treatment Referring Physician:  EDP  Keith Murray is an 52 y.o. male.  HPI:  Patient was brought to [facility] by [facility] after patient was found wondering the street in his underwear.  Patient family contacted and informed that patient has not been taking his medications.  Patient confirms that he has not been taking his medications.  States that the Cogentin was making him short of breath which is the reason he stopped.   When patient asked about follow ups after his las admission.  Patient states "When I got there they asked me why I was there; they acted like I wasn't suppose to be there so I left. Patient states that he has not done any of the scheduled follow ups after his last inpatient admission. Patient unable at this time to give a clear history of events that led to hospital and compliance with medication or treatment plan.    Past Medical History  Diagnosis Date  . Mental disorder     History reviewed. No pertinent past surgical history.  No family history on file.  Social History:  reports that he has been smoking.  He does not have any smokeless tobacco history on file. He reports that  drinks alcohol. He reports that he uses illicit drugs (Cocaine and Marijuana).  Allergies: No Known Allergies  Medications: I have reviewed the patient's current medications.  Results for orders placed during the hospital encounter of 10/07/12 (from the past 48 hour(s))  ACETAMINOPHEN LEVEL     Status: None   Collection Time    10/07/12  3:13 AM      Result Value Range   Acetaminophen (Tylenol), Serum <15.0  10 - 30 ug/mL   Comment:            THERAPEUTIC CONCENTRATIONS VARY     SIGNIFICANTLY. A RANGE OF 10-30     ug/mL MAY BE AN EFFECTIVE     CONCENTRATION FOR MANY PATIENTS.     HOWEVER, SOME ARE BEST TREATED     AT CONCENTRATIONS OUTSIDE THIS     RANGE.     ACETAMINOPHEN CONCENTRATIONS     >150 ug/mL AT 4 HOURS AFTER   INGESTION AND >50 ug/mL AT 12     HOURS AFTER INGESTION ARE     OFTEN ASSOCIATED WITH TOXIC     REACTIONS.  CBC     Status: None   Collection Time    10/07/12  3:13 AM      Result Value Range   WBC 8.7  4.0 - 10.5 K/uL   RBC 4.47  4.22 - 5.81 MIL/uL   Hemoglobin 14.0  13.0 - 17.0 g/dL   HCT 40.9  81.1 - 91.4 %   MCV 87.5  78.0 - 100.0 fL   MCH 31.3  26.0 - 34.0 pg   MCHC 35.8  30.0 - 36.0 g/dL   RDW 78.2  95.6 - 21.3 %   Platelets 224  150 - 400 K/uL  COMPREHENSIVE METABOLIC PANEL     Status: Abnormal   Collection Time    10/07/12  3:13 AM      Result Value Range   Sodium 134 (*) 135 - 145 mEq/L   Potassium 3.6  3.5 - 5.1 mEq/L   Chloride 98  96 - 112 mEq/L   CO2 19  19 - 32 mEq/L   Glucose, Bld 98  70 - 99 mg/dL   BUN 31 (*) 6 - 23 mg/dL  Creatinine, Ser 2.00 (*) 0.50 - 1.35 mg/dL   Calcium 9.2  8.4 - 47.8 mg/dL   Total Protein 7.6  6.0 - 8.3 g/dL   Albumin 4.4  3.5 - 5.2 g/dL   AST 25  0 - 37 U/L   ALT 13  0 - 53 U/L   Alkaline Phosphatase 116  39 - 117 U/L   Total Bilirubin 0.9  0.3 - 1.2 mg/dL   GFR calc non Af Amer 37 (*) >90 mL/min   GFR calc Af Amer 43 (*) >90 mL/min   Comment:            The eGFR has been calculated     using the CKD EPI equation.     This calculation has not been     validated in all clinical     situations.     eGFR's persistently     <90 mL/min signify     possible Chronic Kidney Disease.  ETHANOL     Status: Abnormal   Collection Time    10/07/12  3:13 AM      Result Value Range   Alcohol, Ethyl (B) 181 (*) 0 - 11 mg/dL   Comment:            LOWEST DETECTABLE LIMIT FOR     SERUM ALCOHOL IS 11 mg/dL     FOR MEDICAL PURPOSES ONLY  SALICYLATE LEVEL     Status: Abnormal   Collection Time    10/07/12  3:13 AM      Result Value Range   Salicylate Lvl <2.0 (*) 2.8 - 20.0 mg/dL  URINE RAPID DRUG SCREEN (HOSP PERFORMED)     Status: Abnormal   Collection Time    10/07/12  3:14 AM      Result Value Range   Opiates NONE DETECTED  NONE  DETECTED   Cocaine POSITIVE (*) NONE DETECTED   Benzodiazepines NONE DETECTED  NONE DETECTED   Amphetamines NONE DETECTED  NONE DETECTED   Tetrahydrocannabinol POSITIVE (*) NONE DETECTED   Barbiturates NONE DETECTED  NONE DETECTED   Comment:            DRUG SCREEN FOR MEDICAL PURPOSES     ONLY.  IF CONFIRMATION IS NEEDED     FOR ANY PURPOSE, NOTIFY LAB     WITHIN 5 DAYS.                LOWEST DETECTABLE LIMITS     FOR URINE DRUG SCREEN     Drug Class       Cutoff (ng/mL)     Amphetamine      1000     Barbiturate      200     Benzodiazepine   200     Tricyclics       300     Opiates          300     Cocaine          300     THC              50  GLUCOSE, CAPILLARY     Status: Abnormal   Collection Time    10/07/12  3:28 AM      Result Value Range   Glucose-Capillary 109 (*) 70 - 99 mg/dL    No results found.  Review of Systems  Psychiatric/Behavioral: Positive for depression, memory loss and substance abuse. Negative for suicidal ideas and hallucinations (Patient denies hearing voices at  this time.  States that he is thinking clear.).       Patient not giving a clear and understandable history of account of which led him being brought to hospital.  Patient does states that he was walking around in his yard in his underwear. "I just walked outside in my yard with underwear one."   Blood pressure 111/65, pulse 95, temperature 98.6 F (37 C), temperature source Oral, resp. rate 18, SpO2 97.00%. Physical Exam  Constitutional: He appears well-developed.  HENT:  Head: Normocephalic.  Neck: Normal range of motion.  Respiratory: Effort normal.  Musculoskeletal: Normal range of motion.  Neurological: He is alert.  Skin: Skin is warm and dry.  Psychiatric: His mood appears anxious. He is slowed. Thought content is paranoid. Cognition and memory are impaired. He exhibits a depressed mood. He expresses no homicidal and no suicidal ideation.   Face to face interview and consult with  Dr. Lolly Mustache  Assessment/Plan:  Axis I: Schizoaffective Disorder and Substance Abuse Axis II: Deferred Axis III:  Past Medical History  Diagnosis Date  . Mental disorder    Axis IV: other psychosocial or environmental problems and problems related to social environment Axis V: 21-30 behavior considerably influenced by delusions or hallucinations OR serious impairment in judgment, communication OR inability to function in almost all areas  Recommendation:  Inpatient treatment.  Patient accepted to  [facility] 400 hall.   1. Admit for crisis management and stabilization.  2. Review and initiate  medications pertinent to patient illness and treatment.  3. Medication management to reduce current symptoms to base line and improve the         patient's overall level of functioning.   Start Librium Protocol  Assunta Found, FNP-BC 10/07/2012, 11:59 AM     I have personally seen the patient and agreed with the findings and involved in the treatment plan. Kathryne Sharper, MD

## 2012-10-07 NOTE — Progress Notes (Signed)
Patient ID: Keith Murray, male   DOB: 1960-04-25, 52 y.o.   MRN: [id number] 10-07-12 nursing adm note: pt came to [facility] voluntarily from [facility] with a admitting dx of schizoaffective d/o and substance abuse/dependence. He has been non compliant with his home medications and was found by gpd out in the road in his underwear. He denied this on admission, is a poor historian and his answers to the clinical questions were not congruent with that data available. Collateral info obtained from his family stated that he was not taking his medications.    He denied any si/hi/av on admission. He minimized his etoh use and denied any drug use. His uds was positive for cocaine and mj. He stated he has a girlfriend, has no children and has never married. He had no complaints of pain, is a low fall risk, had no allergies, no medical problems and is a smoker at 1/2 ppd. He was recently on the 400 hall. Labs are: aceta <15,cbc=wnl,cmet wnl except na 134 low bun 31 high creatinine 2.00 high; etoh 181 high on 10-07-12 at 3:13am, salicylate <2 low. Pt was escorted to the 400 hall and was polite/cooperative during the adm process.

## 2012-10-07 NOTE — ED Provider Notes (Signed)
Medical screening examination/treatment/procedure(s) were performed by non-physician practitioner and as supervising physician I was immediately available for consultation/collaboration.   Loren Racer, MD 10/07/12 [id number]

## 2012-10-07 NOTE — ED Provider Notes (Signed)
History    CSN: [id number] Arrival date & time 10/07/12  4098  First MD Initiated Contact with Patient 10/07/12 0301     Chief Complaint  Patient presents with  . Medical Clearance   Level V caveat apply secondary to altered metal status.  HPI  History provided by EMS. Patient is a 52 year old male with history of schizoaffective disorder, polysubstance abuse and psychosis who presents with altered mental status. Patient had been wandering in the sheet in his underwear. 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 Patient's family was contacted who reported that patient normally takes medications but has not been taking any of his medicines recently. No other history was provided. Patient is unable to give clear history but generally states that he feels well in no pain. He does frequently state that "God does not favor the unattractive". He does not provide any other history.     Past Medical History  Diagnosis Date  . Mental disorder    No past surgical history on file. No family history on file. History  Substance Use Topics  . Smoking status: Current Every Day Smoker -- 1.50 packs/day  . Smokeless tobacco: Not on file  . Alcohol Use: Yes     Comment: Unk amt; pt refused     Review of Systems  Unable to perform ROS: Psychiatric disorder    Allergies  Review of patient's allergies indicates no known allergies.  Home Medications   Current Outpatient Rx  Name  Route  Sig  Dispense  Refill  . benztropine (COGENTIN) 0.5 MG tablet   Oral   Take 1 tablet (0.5 mg total) by mouth 2 (two) times daily. For side effects.   60 tablet   0   . haloperidol (HALDOL) 5 MG tablet   Oral   Take 1 tablet (5 mg total) by mouth 2 (two) times daily. For mental clarity and psychosis.   60 tablet   0   . traZODone (DESYREL) 100 MG tablet   Oral   Take 1 tablet (100 mg total) by mouth at bedtime. For insomnia.   30 tablet   0    BP 135/91  Pulse 101   Temp(Src) 98.8 F (37.1 C) (Oral)  Resp 16  SpO2 94% Physical Exam  Nursing note and vitals reviewed. Constitutional: He is oriented to person, place, and time. He appears well-developed and well-nourished. No distress.  HENT:  Head: Normocephalic and atraumatic.  Eyes: Conjunctivae and EOM are normal. Pupils are equal, round, and reactive to light.  Cardiovascular: Normal rate and regular rhythm.   Pulmonary/Chest: Effort normal and breath sounds normal. No respiratory distress. He has no wheezes.  Abdominal: Soft. There is no tenderness.  Musculoskeletal: Normal range of motion.  Neurological: He is alert and oriented to person, place, and time. He has normal strength. No cranial nerve deficit or sensory deficit. Gait normal.  Skin: Skin is warm.  Psychiatric: He has a normal mood and affect. His behavior is normal.    ED Course  Procedures   Results for orders placed during the hospital encounter of 10/07/12  ACETAMINOPHEN LEVEL      Result Value Range   Acetaminophen (Tylenol), Serum <15.0  10 - 30 ug/mL  CBC      Result Value Range   WBC 8.7  4.0 - 10.5 K/uL   RBC 4.47  4.22 - 5.81 MIL/uL   Hemoglobin 14.0  13.0 - 17.0 g/dL   HCT 11.9  14.7 - 82.9 %  MCV 87.5  78.0 - 100.0 fL   MCH 31.3  26.0 - 34.0 pg   MCHC 35.8  30.0 - 36.0 g/dL   RDW 40.9  81.1 - 91.4 %   Platelets 224  150 - 400 K/uL  COMPREHENSIVE METABOLIC PANEL      Result Value Range   Sodium 134 (*) 135 - 145 mEq/L   Potassium 3.6  3.5 - 5.1 mEq/L   Chloride 98  96 - 112 mEq/L   CO2 19  19 - 32 mEq/L   Glucose, Bld 98  70 - 99 mg/dL   BUN 31 (*) 6 - 23 mg/dL   Creatinine, Ser 7.82 (*) 0.50 - 1.35 mg/dL   Calcium 9.2  8.4 - 95.6 mg/dL   Total Protein 7.6  6.0 - 8.3 g/dL   Albumin 4.4  3.5 - 5.2 g/dL   AST 25  0 - 37 U/L   ALT 13  0 - 53 U/L   Alkaline Phosphatase 116  39 - 117 U/L   Total Bilirubin 0.9  0.3 - 1.2 mg/dL   GFR calc non Af Amer 37 (*) >90 mL/min   GFR calc Af Amer 43 (*) >90 mL/min   ETHANOL      Result Value Range   Alcohol, Ethyl (B) 181 (*) 0 - 11 mg/dL  SALICYLATE LEVEL      Result Value Range   Salicylate Lvl <2.0 (*) 2.8 - 20.0 mg/dL  URINE RAPID DRUG SCREEN (HOSP PERFORMED)      Result Value Range   Opiates NONE DETECTED  NONE DETECTED   Cocaine POSITIVE (*) NONE DETECTED   Benzodiazepines NONE DETECTED  NONE DETECTED   Amphetamines NONE DETECTED  NONE DETECTED   Tetrahydrocannabinol POSITIVE (*) NONE DETECTED   Barbiturates NONE DETECTED  NONE DETECTED  GLUCOSE, CAPILLARY      Result Value Range   Glucose-Capillary 109 (*) 70 - 99 mg/dL       1. Schizoaffective disorder   2. Cocaine abuse   3. Alcohol intoxication, uncomplicated      MDM  3:15AM patient seen and evaluated. Patient does not appear in any acute distress.  Spoke with Page with [facility] act team.  She will see pt and evaluate for placement.  Holding orders in place.  Angus Seller, PA-C 10/07/12 0600

## 2012-10-07 NOTE — ED Notes (Signed)
Transported by security and mental health tech to [facility]. Belongings bag x1 given to security. Ambulatory without difficulty. No complaints voiced.

## 2012-10-07 NOTE — Tx Team (Signed)
Initial Interdisciplinary Treatment Plan  PATIENT STRENGTHS: (choose at least two) Communication skills Motivation for treatment/growth Supportive family/friends  PATIENT STRESSORS: Financial difficulties Health problems Medication change or noncompliance Substance abuse   PROBLEM LIST: Problem List/Patient Goals Date to be addressed Date deferred Reason deferred Estimated date of resolution  Suicidal ideation 10/07/2012   D/c        Substance abuse 10/07/2012   D/c        depression 10/07/2012   D/c                           DISCHARGE CRITERIA:  Ability to meet basic life and health needs Improved stabilization in mood, thinking, and/or behavior Medical problems require only outpatient monitoring Motivation to continue treatment in a less acute level of care Need for constant or close observation no longer present Reduction of life-threatening or endangering symptoms to within safe limits Safe-care adequate arrangements made Verbal commitment to aftercare and medication compliance  PRELIMINARY DISCHARGE PLAN: Attend aftercare/continuing care group Attend PHP/IOP Attend 12-step recovery group Outpatient therapy Return to previous living arrangement  PATIENT/FAMIILY INVOLVEMENT: This treatment plan has been presented to and reviewed with the patient, Keith Murray.  The patient and family have been given the opportunity to ask questions and make suggestions.  Earline Mayotte 10/07/2012, 2:50 PM

## 2012-10-07 NOTE — ED Notes (Signed)
Report called to [facility] at [facility]. Accepted to room 405-2 Dr.Akintayo. Will notify security of transportation need.

## 2012-10-07 NOTE — ED Notes (Signed)
Bed:WA18<BR> Expected date:<BR> Expected time:<BR> Means of arrival:<BR> Comments:<BR> EMS

## 2012-10-07 NOTE — BH Assessment (Signed)
[facility] Assessment Progress Note   Keith Murray who did rounds with Dr. Lolly Mustache accepted pt to 405-2 at [facility].  Pt is voluntary and is willing to sign into [facility].  Pt will go to [facility] after 1330.

## 2012-10-07 NOTE — ED Notes (Signed)
Pt changed into blue scrubs wanded and belongings wanded.  Jeans, nike tennis shoes, and blue polo style shirt, and white briefs

## 2012-10-08 ENCOUNTER — Encounter (HOSPITAL_COMMUNITY): Payer: Self-pay | Admitting: Psychiatry

## 2012-10-08 DIAGNOSIS — F259 Schizoaffective disorder, unspecified: Principal | ICD-10-CM

## 2012-10-08 DIAGNOSIS — F1910 Other psychoactive substance abuse, uncomplicated: Secondary | ICD-10-CM

## 2012-10-08 NOTE — Progress Notes (Signed)
Adult Psychoeducational Group Note  Date:  10/08/2012 Time:  1:53 PM  Group Topic/Focus:  Wellness Toolbox:   The focus of this group is to discuss various aspects of wellness, balancing those aspects and exploring ways to increase the ability to experience wellness.  Patients will create a wellness toolbox for use upon discharge.  Participation Level:  Minimal  Participation Quality:  Attentive  Affect:  Not Congruent  Cognitive:  Lacking  Insight: None  Engagement in Group:  Poor  Modes of Intervention:  Education, Exploration and Support  Additional Comments:  Keith Murray came into group about 5 minutes before the end. His affect did not match the conversation and he offered very little.   Nichola Sizer 10/08/2012, 1:53 PM

## 2012-10-08 NOTE — [facility] Counselor (Signed)
Adult Psychosocial Assessment Update Interdisciplinary Team  Previous [facility] admissions/discharges:  Admissions Discharges  Date:08/03/12 Date:  Date: Date:  Date: Date:  Date: Date:  Date: Date:   Changes since the last Psychosocial Assessment (including adherence to outpatient mental health and/or substance abuse treatment, situational issues contributing to decompensation and/or relapse). Keith Murray's niece Keith Murray reports that since last admission, he went to [facility] rehab as instructed, but told them he did not have a problem, so they released him.  He has been taking his meds daily because she goes by daily to make sure, but also acknowledges that when he uses alcohol or drugs, "he turns into a different person."  I explained ACT team to her, and she believes that would be a good thing.             Discharge Plan 1. Will you be returning to the same living situation after discharge?   Yes:X  Home No:      If no, what is your plan?    [place] away from negative influence of city.  Not working quite like they hoped.       2. Would you like a referral for services when you are discharged? Yes:  X   If yes, for what services?  No:       Will refer to ACT team       Summary and Recommendations (to be completed by the evaluator) Keith Murray is a 52 YO AA male who looks much older than his chronological age.  He is here due to increased psychotic symptoms and substance use.  He has limited insight and uses poor judgment.  He can benefit from crises stabilization, medication management, therapeutic milieu and referral to ACT team.                       Signature:  Keith Murray, 10/08/2012 5:40 PM

## 2012-10-08 NOTE — Progress Notes (Signed)
The focus of this group is to help patients review their daily goal of treatment and discuss progress on daily workbooks. Pt attended the evening group session and responded to all discussion prompts from the Writer. Pt reported having a good day, which he attributed to his medications working. Pt reported he knew he could feel this way all the time if he would only stick to his medications upon discharge, which he said he would try to do this time. Pt's affect was bright.

## 2012-10-08 NOTE — H&P (Signed)
Psychiatric Admission Assessment Adult  Patient Identification:  Keith Murray Date of Evaluation:  10/08/2012 Chief Complaint:  "I was not compliant with my medications." History of Present Illness:: Patient is a 52 year old man with history of Schizoaffective disorder and polysubstance abuse who was brought to ED by [facility] after he was found wondering the street in his underwear. Patient reports that he was not taking his medication after he was discharged from hospital few months ago. Instead, he has been self medicating by drinking alcohol, smoking Marijuana and  Crack cocaine. Patient reports vague depressive symptoms, psychosis and his thought process is disorganized. He denies suicidal or homicidal ideations. He is requesting to get back on his medications.  Elements:  Location:  [facility] inpatient. Associated Signs/Synptoms: Depression Symptoms:  psychomotor retardation, difficulty concentrating, disturbed sleep, (Hypo) Manic Symptoms:  Flight of Ideas, Impulsivity, Anxiety Symptoms:  denies Psychotic Symptoms:  Hallucinations: Auditory PTSD Symptoms: Negative  Psychiatric Specialty Exam: Physical Exam  Psychiatric: His speech is normal. Thought content normal. He is actively hallucinating. Cognition and memory are normal. He expresses impulsivity. He exhibits a depressed mood.    Review of Systems  Constitutional: Negative.   HENT: Negative.   Eyes: Negative.   Respiratory: Negative.   Cardiovascular: Negative.   Gastrointestinal: Negative.   Genitourinary: Negative.   Musculoskeletal: Negative.   Skin: Negative.   Neurological: Negative.   Endo/Heme/Allergies: Negative.   Psychiatric/Behavioral: Positive for hallucinations and substance abuse. The patient is nervous/anxious and has insomnia.     Blood pressure 124/72, pulse 72, temperature 97.4 F (36.3 C), temperature source Oral, resp. rate 18, height 6' (1.829 m), weight 71.668 kg (158 lb), SpO2  100.00%.Body mass index is 21.42 kg/(m^2).  General Appearance: Disheveled  Eye Solicitor::  Fair  Speech:  Clear and Coherent  Volume:  Normal  Mood:  Anxious  Affect:  Full Range  Thought Process:  Disorganized  Orientation:  Full (Time, Place, and Person)  Thought Content:  Hallucinations: Auditory  Suicidal Thoughts:  No  Homicidal Thoughts:  No  Memory:  Immediate;   Fair Recent;   Fair Remote;   Fair  Judgement:  Poor  Insight:  Lacking  Psychomotor Activity:  Normal  Concentration:  Fair  Recall:  Fair  Akathisia:  No  Handed:  Right  AIMS (if indicated):     Assets:  Communication Skills Desire for Improvement  Sleep:  Number of Hours: 6.75    Past Psychiatric History: Diagnosis:  Hospitalizations:  Outpatient Care:  Substance Abuse Care:  Self-Mutilation:  Suicidal Attempts:  Violent Behaviors:   Past Medical History:   Past Medical History  Diagnosis Date  . Mental disorder     Allergies:  No Known Allergies PTA Medications: Prescriptions prior to admission  Medication Sig Dispense Refill  . benztropine (COGENTIN) 0.5 MG tablet Take 1 tablet (0.5 mg total) by mouth 2 (two) times daily. For side effects.  60 tablet  0  . haloperidol (HALDOL) 5 MG tablet Take 1 tablet (5 mg total) by mouth 2 (two) times daily. For mental clarity and psychosis.  60 tablet  0  . traZODone (DESYREL) 100 MG tablet Take 100 mg by mouth at bedtime. For insomnia        Previous Psychotropic Medications:  Medication/Dose; Haldol, Trazodone, Cogentin                 Substance Abuse History in the last 12 months:  yes  Consequences of Substance Abuse: disorganized behavior  Social History:  reports that he has been smoking.  He does not have any smokeless tobacco history on file. He reports that  drinks alcohol. He reports that he uses illicit drugs (Cocaine and Marijuana). Additional Social History: Pain Medications: none  Prescriptions: cogentin, haldol and  traadone Over the Counter: none  History of alcohol / drug use?: Yes Longest period of sobriety (when/how long): 2 yrs Negative Consequences of Use: Financial Withdrawal Symptoms: Other (Comment) (denied ) Name of Substance 1: cocaine  1 - Age of First Use: late 10's 1 - Amount (size/oz): unknown  1 - Frequency: x1 per week  1 - Duration: approx 20 yrs  1 - Last Use / Amount: within the last week  Name of Substance 2: etoh  2 - Age of First Use: teenager  2 - Amount (size/oz): 1-2 12 oz can  2 - Frequency: 'sometimes" 2 - Duration: unknown  2 - Last Use / Amount: last week                 Current Place of Residence:   Place of Birth:   Family Members: Marital Status:  Single Children:  Sons:  Daughters: Relationships: Education:  HS Print production planner Problems/Performance: Religious Beliefs/Practices: History of Abuse (Emotional/Phsycial/Sexual) Occupational Experiences; Military History:  None. Legal History: Hobbies/Interests:  Family History:  History reviewed. No pertinent family history.  Results for orders placed during the hospital encounter of 10/07/12 (from the past 72 hour(s))  ACETAMINOPHEN LEVEL     Status: None   Collection Time    10/07/12  3:13 AM      Result Value Range   Acetaminophen (Tylenol), Serum <15.0  10 - 30 ug/mL   Comment:            THERAPEUTIC CONCENTRATIONS VARY     SIGNIFICANTLY. A RANGE OF 10-30     ug/mL MAY BE AN EFFECTIVE     CONCENTRATION FOR MANY PATIENTS.     HOWEVER, SOME ARE BEST TREATED     AT CONCENTRATIONS OUTSIDE THIS     RANGE.     ACETAMINOPHEN CONCENTRATIONS     >150 ug/mL AT 4 HOURS AFTER     INGESTION AND >50 ug/mL AT 12     HOURS AFTER INGESTION ARE     OFTEN ASSOCIATED WITH TOXIC     REACTIONS.  CBC     Status: None   Collection Time    10/07/12  3:13 AM      Result Value Range   WBC 8.7  4.0 - 10.5 K/uL   RBC 4.47  4.22 - 5.81 MIL/uL   Hemoglobin 14.0  13.0 - 17.0 g/dL   HCT 16.1  09.6 - 04.5  %   MCV 87.5  78.0 - 100.0 fL   MCH 31.3  26.0 - 34.0 pg   MCHC 35.8  30.0 - 36.0 g/dL   RDW 40.9  81.1 - 91.4 %   Platelets 224  150 - 400 K/uL  COMPREHENSIVE METABOLIC PANEL     Status: Abnormal   Collection Time    10/07/12  3:13 AM      Result Value Range   Sodium 134 (*) 135 - 145 mEq/L   Potassium 3.6  3.5 - 5.1 mEq/L   Chloride 98  96 - 112 mEq/L   CO2 19  19 - 32 mEq/L   Glucose, Bld 98  70 - 99 mg/dL   BUN 31 (*) 6 - 23 mg/dL   Creatinine, Ser 7.82 (*)  0.50 - 1.35 mg/dL   Calcium 9.2  8.4 - 16.1 mg/dL   Total Protein 7.6  6.0 - 8.3 g/dL   Albumin 4.4  3.5 - 5.2 g/dL   AST 25  0 - 37 U/L   ALT 13  0 - 53 U/L   Alkaline Phosphatase 116  39 - 117 U/L   Total Bilirubin 0.9  0.3 - 1.2 mg/dL   GFR calc non Af Amer 37 (*) >90 mL/min   GFR calc Af Amer 43 (*) >90 mL/min   Comment:            The eGFR has been calculated     using the CKD EPI equation.     This calculation has not been     validated in all clinical     situations.     eGFR's persistently     <90 mL/min signify     possible Chronic Kidney Disease.  ETHANOL     Status: Abnormal   Collection Time    10/07/12  3:13 AM      Result Value Range   Alcohol, Ethyl (B) 181 (*) 0 - 11 mg/dL   Comment:            LOWEST DETECTABLE LIMIT FOR     SERUM ALCOHOL IS 11 mg/dL     FOR MEDICAL PURPOSES ONLY  SALICYLATE LEVEL     Status: Abnormal   Collection Time    10/07/12  3:13 AM      Result Value Range   Salicylate Lvl <2.0 (*) 2.8 - 20.0 mg/dL  URINE RAPID DRUG SCREEN (HOSP PERFORMED)     Status: Abnormal   Collection Time    10/07/12  3:14 AM      Result Value Range   Opiates NONE DETECTED  NONE DETECTED   Cocaine POSITIVE (*) NONE DETECTED   Benzodiazepines NONE DETECTED  NONE DETECTED   Amphetamines NONE DETECTED  NONE DETECTED   Tetrahydrocannabinol POSITIVE (*) NONE DETECTED   Barbiturates NONE DETECTED  NONE DETECTED   Comment:            DRUG SCREEN FOR MEDICAL PURPOSES     ONLY.  IF CONFIRMATION  IS NEEDED     FOR ANY PURPOSE, NOTIFY LAB     WITHIN 5 DAYS.                LOWEST DETECTABLE LIMITS     FOR URINE DRUG SCREEN     Drug Class       Cutoff (ng/mL)     Amphetamine      1000     Barbiturate      200     Benzodiazepine   200     Tricyclics       300     Opiates          300     Cocaine          300     THC              50  GLUCOSE, CAPILLARY     Status: Abnormal   Collection Time    10/07/12  3:28 AM      Result Value Range   Glucose-Capillary 109 (*) 70 - 99 mg/dL   Psychological Evaluations:  Assessment:   AXIS I:  Schizoaffective Disorder              Polysubstance abuse AXIS II:  Deferred AXIS III:  Past Medical History  Diagnosis Date  . Mental disorder    AXIS IV:  other psychosocial or environmental problems, problems related to social environment and non-compliant with medications AXIS V:  31-40 impairment in reality testing  Treatment Plan/Recommendations:  1. Admit for crisis management and stabilization. 2. Medication management to reduce current symptoms to base line and improve the     patient's overall level of functioning 3. Treat health problems as indicated. 4. Develop treatment plan to decrease risk of relapse upon discharge and the need for     readmission. 5. Psycho-social education regarding relapse prevention and self care. 6. Health care follow up as needed for medical problems. 7. Restart home medications where appropriate.   Treatment Plan Summary: Daily contact with patient to assess and evaluate symptoms and progress in treatment Medication management Current Medications:  Current Facility-Administered Medications  Medication Dose Route Frequency Provider Last Rate Last Dose  . acetaminophen (TYLENOL) tablet 650 mg  650 mg Oral Q6H PRN Shuvon Rankin, NP      . alum & mag hydroxide-simeth (MAALOX/MYLANTA) 200-200-20 MG/5ML suspension 30 mL  30 mL Oral PRN Shuvon Rankin, NP      . alum & mag hydroxide-simeth (MAALOX/MYLANTA)  200-200-20 MG/5ML suspension 30 mL  30 mL Oral Q4H PRN Shuvon Rankin, NP      . benztropine (COGENTIN) tablet 0.5 mg  0.5 mg Oral BID Shuvon Rankin, NP   0.5 mg at 10/08/12 0823  . chlordiazePOXIDE (LIBRIUM) capsule 25 mg  25 mg Oral Q6H PRN Shuvon Rankin, NP      . chlordiazePOXIDE (LIBRIUM) capsule 25 mg  25 mg Oral QID Shuvon Rankin, NP   25 mg at 10/08/12 0823   Followed by  . [START ON 10/09/2012] chlordiazePOXIDE (LIBRIUM) capsule 25 mg  25 mg Oral TID Shuvon Rankin, NP       Followed by  . [START ON 10/10/2012] chlordiazePOXIDE (LIBRIUM) capsule 25 mg  25 mg Oral BH-qamhs Shuvon Rankin, NP       Followed by  . [START ON 10/11/2012] chlordiazePOXIDE (LIBRIUM) capsule 25 mg  25 mg Oral Daily Shuvon Rankin, NP      . haloperidol (HALDOL) tablet 5 mg  5 mg Oral BID Shuvon Rankin, NP   5 mg at 10/08/12 0981  . hydrOXYzine (ATARAX/VISTARIL) tablet 25 mg  25 mg Oral Q6H PRN Shuvon Rankin, NP      . ibuprofen (ADVIL,MOTRIN) tablet 600 mg  600 mg Oral Q8H PRN Shuvon Rankin, NP      . loperamide (IMODIUM) capsule 2-4 mg  2-4 mg Oral PRN Shuvon Rankin, NP      . magnesium hydroxide (MILK OF MAGNESIA) suspension 30 mL  30 mL Oral Daily PRN Shuvon Rankin, NP      . multivitamin with minerals tablet 1 tablet  1 tablet Oral Daily Shuvon Rankin, NP   1 tablet at 10/08/12 0829  . nicotine (NICODERM CQ - dosed in mg/24 hours) patch 21 mg  21 mg Transdermal Daily Shuvon Rankin, NP      . ondansetron (ZOFRAN) tablet 4 mg  4 mg Oral Q8H PRN Shuvon Rankin, NP      . ondansetron (ZOFRAN-ODT) disintegrating tablet 4 mg  4 mg Oral Q6H PRN Shuvon Rankin, NP      . thiamine (B-1) injection 100 mg  100 mg Intramuscular Once Shuvon Rankin, NP      . thiamine (VITAMIN B-1) tablet 100 mg  100 mg Oral Daily Shuvon Rankin, NP  100 mg at 10/08/12 0823  . traZODone (DESYREL) tablet 100 mg  100 mg Oral QHS Shuvon Rankin, NP   100 mg at 10/07/12 2149    Observation Level/Precautions:  routine  Laboratory:  routine   Psychotherapy:    Medications:    Consultations:    Discharge Concerns:    Estimated LOS:3-5 days  Other:     I certify that inpatient services furnished can reasonably be expected to improve the patient's condition.   Thedore Mins, MD 7/14/201411:46 AM

## 2012-10-08 NOTE — Tx Team (Signed)
  Interdisciplinary Treatment Plan Update   Date Reviewed:  10/08/2012  Time Reviewed:  8:23 AM  Progress in Treatment:   Attending groups: Yes Participating in groups: Yes Taking medication as prescribed: Yes  Tolerating medication: Yes Family/Significant other contact made: No  Patient understands diagnosis: No  Minimal insight Discussing patient identified problems/goals with staff: Yes  See initial plan Medical problems stabilized or resolved: Yes Denies suicidal/homicidal ideation: Yes  In tx team Patient has not harmed self or others: Yes  For review of initial/current patient goals, please see plan of care.  Estimated Length of Stay:  4-5 days  Reason for Continuation of Hospitalization: Hallucinations Medication stabilization Withdrawal symptoms  New Problems/Goals identified:  N/A  Discharge Plan or Barriers:   unknown  Additional Comments:  pt came to [facility] voluntarily from [facility] with a admitting dx of schizoaffective d/o and substance abuse/dependence. He has been non compliant with his home medications and was found by gpd out in the road in his underwear. He denied this on admission, is a poor historian and his answers to the clinical questions were not congruent with that data available. Collateral info obtained from his family stated that he was not taking his medications. He denied any si/hi/av on admission. He minimized his etoh use and denied any drug use. His uds was positive for cocaine and mj. He stated he has a girlfriend, has no children and has never married.    Attendees:  Signature: Thedore Mins, MD 10/08/2012 8:23 AM   Signature: Richelle Ito, LCSW 10/08/2012 8:23 AM  Signature: Verne Spurr, PA 10/08/2012 8:23 AM  Signature: Joslyn Devon, RN 10/08/2012 8:23 AM  Signature: Liborio Nixon, RN 10/08/2012 8:23 AM  Signature:  10/08/2012 8:23 AM  Signature:   10/08/2012 8:23 AM  Signature:    Signature:    Signature:    Signature:    Signature:     Signature:      Scribe for Treatment Team:   Richelle Ito, LCSW  10/08/2012 8:23 AM

## 2012-10-08 NOTE — Progress Notes (Signed)
Recreation Therapy Notes  Date: 07.14.2014 Time: 9:30am Location: [address] Dayroom      Group Topic/Focus: Leisure Education  Participation Level: Active  Participation Quality: Appropriate and Attentive  Affect: Euthymic to drowsy  Cognitive: Appropriate   Additional Comments: Activity: Adapted On Deck ; Explanation: Patients were asked to roll a di and choose a leisure/recreation activity from a container. If patient rolled a 1-3 patient was asked to act out the leisure/recreation activity. If patient rolled 4-6 patient was asked to draw leisure recreation activity.   Patient arrived to group session at approximately 9:45am. Upon arriving to group session patient immediately engaged in group activity. Patient selected "hikling" from the container. Slip of paper was initially upside down, patient showed LRT paper and his face indicated he did not understand what activity was on the slip of paper. LRT turned the paper right side up and patient face lit up with understanding. Patient  roll instructed him to draw the activity for the group to guess. Patient drew what resembled a cart with wheels, peers guessed "driving" patient stated peers were correct. It is unclear to LRT if patient was not able to read the activity listed on the paper or if patient acuity prevented him from drawing correct activity on board. Patient shared he enjoys washing his cars and cutting his grass. Patient leaned his head on the wall behind him and closed his eyes as if he were sleeping at one point during group session, LRT called patient name, patient immediatly responded. Patient needed LRT to repeat question but was able to answer it. Patient remained engaged in remainder of group session.   Marykay Lex Kiela Shisler, LRT/CTRS  Caylea Foronda L 10/08/2012 1:06 PM

## 2012-10-08 NOTE — Progress Notes (Signed)
D: Pt presents with no insight and thought blocking. Pt denies SI/HI/AVH. Pt is overly polite w/fixed smile. Pt reports that everything is okay and denies any adverse reactions to his meds. Pt fidgety and paces in hallway this morning. Pt compliant with taking meds an attending groups. A: Medications administered as ordered per MD. Verbal support given. Pt encouraged to attend groups. 15 minute checks performed for safety. R: Pt remains safe at this time. Pt anxious.

## 2012-10-08 NOTE — BHH Group Notes (Signed)
[facility] LCSW Aftercare Discharge Planning Group Note   10/08/2012 8:23 AM  Participation Quality:  Engaged  Mood/Affect:  Appropriate  Depression Rating:  denies  Anxiety Rating:  denies  Thoughts of Suicide:  No Will you contract for safety?   NA  Current AVH:  No  Plan for Discharge/Comments:  Kane is in a good mood and smiles a lot during his presentation.  States he went to a place in [facility] when he left her for follow-up.  States it didn't go well and they told him he did not need to be there.  As for why he is here, states he was just minding his own business and the next thing he knew, the sheriff picked him up and brought him in.  Mentions nothing about substance abuse.  Little insight, poor judgment.  Transportation [employer]:  [employer]  Supports: family  [place], [place] B

## 2012-10-08 NOTE — BHH Suicide Risk Assessment (Signed)
Suicide Risk Assessment  Admission Assessment     Nursing information obtained from:  Patient Demographic factors:  Male;Low socioeconomic status;Unemployed Current Mental Status:    Loss Factors:  Financial problems / change in socioeconomic status Historical Factors:  Family history of mental illness or substance abuse Risk Reduction Factors:  Living with another person, especially a relative  CLINICAL FACTORS:   Alcohol/Substance Abuse/Dependencies Previous Psychiatric Diagnoses and Treatments  COGNITIVE FEATURES THAT CONTRIBUTE TO RISK:  Closed-mindedness Polarized thinking    SUICIDE RISK:   Minimal: No identifiable suicidal ideation.  Patients presenting with no risk factors but with morbid ruminations; may be classified as minimal risk based on the severity of the depressive symptoms  PLAN OF CARE:1. Admit for crisis management and stabilization. 2. Medication management to reduce current symptoms to base line and improve the     patient's overall level of functioning 3. Treat health problems as indicated. 4. Develop treatment plan to decrease risk of relapse upon discharge and the need for     readmission. 5. Psycho-social education regarding relapse prevention and self care. 6. Health care follow up as needed for medical problems. 7. Restart home medications where appropriate.   I certify that inpatient services furnished can reasonably be expected to improve the patient's condition.  Thedore Mins, MD 10/08/2012, 11:44 AM

## 2012-10-08 NOTE — BHH Group Notes (Signed)
[facility] LCSW Group Therapy  10/08/2012 1:15 pm  Type of Therapy: Process Group Therapy  Participation Level:  Refused to come to group    Summary of Progress/Problems: Today's group addressed the issue of overcoming obstacles.  Patients were asked to identify their biggest obstacle post d/c that stands in the way of their on-going success, and then problem solve as to how to manage this.  Daryel Gerald B 10/08/2012   3:22 PM

## 2012-10-09 DIAGNOSIS — F1410 Cocaine abuse, uncomplicated: Secondary | ICD-10-CM

## 2012-10-09 DIAGNOSIS — Z91199 Patient's noncompliance with other medical treatment and regimen due to unspecified reason: Secondary | ICD-10-CM

## 2012-10-09 DIAGNOSIS — Z9119 Patient's noncompliance with other medical treatment and regimen: Secondary | ICD-10-CM

## 2012-10-09 DIAGNOSIS — F1010 Alcohol abuse, uncomplicated: Secondary | ICD-10-CM

## 2012-10-09 NOTE — Progress Notes (Addendum)
D: Pt is appropriate in affect and mood. Pt is denying any depression or anxiety. Pt is also denying any SI/HI/AVH. He is reporting that his only ride home is leaving this Tuesday.He states that his niece is leaving to see his sick aunt. Pt reporting that he wants to visit this sick aunt with the niece. Pt signed a 72 hour request for discharge. Pt has been informed that it's possible that the Doctor will not discharge him tomorrow. Pt still signs the 72 hour request form with hopes of being discharged. Pt attended group this evening. Pt observed interacting appropriately within the milieu. He is denying any withdraw symptoms. Reports that his meds are working. A: Writer administered scheduled medications to pt. Continued support and availability as needed was extended to this pt. Staff continue to monitor pt with q51min checks.  R: No adverse drug reactions noted. Pt receptive to treatment. Pt remains safe at this time.

## 2012-10-09 NOTE — Progress Notes (Signed)
D: Pt denies SI/HI/AVH. Pt denies depression/anxiety. Pt is overly polite, fixed smile, pleasant and cooperative. Pt attend groups and comply with med regimen. Pt denies any adverse reaction to meds today. Pt voiced no concerns this morning. A: Medications administered as ordered per MD. Verbal support given. Pt encouraged to attend groups. 15 minute checks performed for safety. R: Pt remains safe.

## 2012-10-09 NOTE — Progress Notes (Signed)
[facility] MD Progress Note  10/09/2012 1:44 PM Keith Murray  MRN:  [id number] Subjective:  "I'm fine mam!"  "All I want to do is to take my medication and keep my appointments!"  Objective:  Patient is alert and oriented x2/3, voices no new symptoms, desiring discharge home. States he will follow up at [facility].  Diagnosis:  Cocaine abuse, alcohol abuse, medical non-compliance, schizoaffective disorder  ADL's:  Intact  Sleep: Good  Appetite:  Good  Suicidal Ideation:  denies Homicidal Ideation:  denies AEB (as evidenced by):  Psychiatric Specialty Exam: Review of Systems  Constitutional: Negative.  Negative for fever, chills, weight loss, malaise/fatigue and diaphoresis.  HENT: Negative for congestion and sore throat.   Eyes: Negative for blurred vision, double vision and photophobia.  Respiratory: Negative for cough, shortness of breath and wheezing.   Cardiovascular: Negative for chest pain, palpitations and PND.  Gastrointestinal: Negative for heartburn, nausea, vomiting, abdominal pain, diarrhea and constipation.  Musculoskeletal: Negative for myalgias, joint pain and falls.  Neurological: Negative for dizziness, tingling, tremors, sensory change, speech change, focal weakness, seizures, loss of consciousness, weakness and headaches.  Endo/Heme/Allergies: Negative for polydipsia. Does not bruise/bleed easily.  Psychiatric/Behavioral: Negative for depression, suicidal ideas, hallucinations, memory loss and substance abuse. The patient is not nervous/anxious and does not have insomnia.     Blood pressure 113/71, pulse 85, temperature 98.1 F (36.7 C), temperature source Oral, resp. rate 20, height 6' (1.829 m), weight 71.668 kg (158 lb), SpO2 100.00%.Body mass index is 21.42 kg/(m^2).  General Appearance: Fairly Groomed  Patent attorney::  Good  Speech:  Clear and Coherent  Volume:  Normal  Mood:  Anxious  Affect:  Congruent  Thought Process:  Coherent  Orientation:  Other:  2/3   Thought Content:  WDL  Suicidal Thoughts:  No  Homicidal Thoughts:  No  Memory:  Immediate;   Fair  Judgement:  Fair  Insight:  Fair  Psychomotor Activity:  Restlessness  Concentration:  Fair  Recall:  Fair  Akathisia:  No  Handed:  Right  AIMS (if indicated):     Assets:  Housing Resilience  Sleep:  Number of Hours: 6.5   Current Medications: Current Facility-Administered Medications  Medication Dose Route Frequency Provider Last Rate Last Dose  . acetaminophen (TYLENOL) tablet 650 mg  650 mg Oral Q6H PRN Shuvon Rankin, NP      . alum & mag hydroxide-simeth (MAALOX/MYLANTA) 200-200-20 MG/5ML suspension 30 mL  30 mL Oral PRN Shuvon Rankin, NP      . alum & mag hydroxide-simeth (MAALOX/MYLANTA) 200-200-20 MG/5ML suspension 30 mL  30 mL Oral Q4H PRN Shuvon Rankin, NP      . benztropine (COGENTIN) tablet 0.5 mg  0.5 mg Oral BID Shuvon Rankin, NP   0.5 mg at 10/09/12 0752  . chlordiazePOXIDE (LIBRIUM) capsule 25 mg  25 mg Oral Q6H PRN Shuvon Rankin, NP      . chlordiazePOXIDE (LIBRIUM) capsule 25 mg  25 mg Oral TID Shuvon Rankin, NP   25 mg at 10/09/12 1158   Followed by  . [START ON 10/10/2012] chlordiazePOXIDE (LIBRIUM) capsule 25 mg  25 mg Oral BH-qamhs Shuvon Rankin, NP       Followed by  . [START ON 10/11/2012] chlordiazePOXIDE (LIBRIUM) capsule 25 mg  25 mg Oral Daily Shuvon Rankin, NP      . haloperidol (HALDOL) tablet 5 mg  5 mg Oral BID Shuvon Rankin, NP   5 mg at 10/09/12 0752  . hydrOXYzine (ATARAX/VISTARIL) tablet 25  mg  25 mg Oral Q6H PRN Shuvon Rankin, NP      . ibuprofen (ADVIL,MOTRIN) tablet 600 mg  600 mg Oral Q8H PRN Shuvon Rankin, NP   600 mg at 10/08/12 2147  . loperamide (IMODIUM) capsule 2-4 mg  2-4 mg Oral PRN Shuvon Rankin, NP      . magnesium hydroxide (MILK OF MAGNESIA) suspension 30 mL  30 mL Oral Daily PRN Shuvon Rankin, NP      . multivitamin with minerals tablet 1 tablet  1 tablet Oral Daily Shuvon Rankin, NP   1 tablet at 10/09/12 0752  . nicotine  (NICODERM CQ - dosed in mg/24 hours) patch 21 mg  21 mg Transdermal Daily Shuvon Rankin, NP   21 mg at 10/09/12 0654  . ondansetron (ZOFRAN) tablet 4 mg  4 mg Oral Q8H PRN Shuvon Rankin, NP      . ondansetron (ZOFRAN-ODT) disintegrating tablet 4 mg  4 mg Oral Q6H PRN Shuvon Rankin, NP      . thiamine (B-1) injection 100 mg  100 mg Intramuscular Once Shuvon Rankin, NP      . thiamine (VITAMIN B-1) tablet 100 mg  100 mg Oral Daily Shuvon Rankin, NP   100 mg at 10/09/12 0751  . traZODone (DESYREL) tablet 100 mg  100 mg Oral QHS Shuvon Rankin, NP   100 mg at 10/08/12 2148    Lab Results: No results found for this or any previous visit (from the past 48 hour(s)).  Physical Findings: AIMS: Facial and Oral Movements Muscles of Facial Expression: None, normal Lips and Perioral Area: None, normal Jaw: None, normal Tongue: None, normal,Extremity Movements Upper (arms, wrists, hands, fingers): None, normal Lower (legs, knees, ankles, toes): None, normal, Trunk Movements Neck, shoulders, hips: None, normal, Overall Severity Severity of abnormal movements (highest score from questions above): None, normal Incapacitation due to abnormal movements: None, normal Patient's awareness of abnormal movements (rate only patient's report): No Awareness, Dental Status Current problems with teeth and/or dentures?: Yes (decay and caries. ) Does patient usually wear dentures?: No  CIWA:  CIWA-Ar Total: 0 COWS:     Treatment Plan Summary: Daily contact with patient to assess and evaluate symptoms and progress in treatment Medication management  Plan: 1. Continue current plan. 2. D/C tomorrow as planned.  Medical Decision Making Problem Points:  Established problem, stable/improving (1) Data Points:  Review of medication regiment & side effects (2)  I certify that inpatient services furnished can reasonably be expected to improve the patient's condition.    Rona Ravens. Banyan Goodchild RPAC 10/09/2012, 1:44 PM

## 2012-10-09 NOTE — BHH Group Notes (Signed)
[facility] LCSW Aftercare Discharge Planning Group Note   10/09/2012 8:17 AM  Participation Quality:  Minimal  Mood/Affect:  Appropriate  Depression Rating:  denies  Anxiety Rating:  denies  Thoughts of Suicide:  No Will you contract for safety?   NA  Current AVH:  No  Plan for Discharge/Comments:   Keith Murray is ready to leave.  He states he is asymptomatic, has no new complaints, and plans to take his meds and not drink or use his drugs ever again when d/ced.  Transportation Means: family  Supports:  family  [place], Baldo Daub

## 2012-10-09 NOTE — Progress Notes (Signed)
Adult Psychoeducational Group Note  Date:  10/09/2012 Time:  10:50 PM  Group Topic/Focus:  Wrap-Up Group:   The focus of this group is to help patients review their daily goal of treatment and discuss progress on daily workbooks.  Participation Level:  Minimal  Participation Quality:  Appropriate  Affect:  Appropriate  Cognitive:  Lacking  Insight: Limited  Engagement in Group:  Engaged  Modes of Intervention:  Support  Additional Comments:  Patient attended and participated in group tonight. He reports having a great day. For his recovery her has an appointment with [facility].  Lita Mains [facility] 10/09/2012, 10:50 PM

## 2012-10-10 NOTE — Progress Notes (Signed)
The focus of this group is to help patients review their daily goal of treatment and discuss progress on daily workbooks. Pt attended the evening group session and responded to all discussion prompts from the Writer. Pt reported having a good day and very much looking forward to tomorrow's discharge. Pt said that he felt his medications were working well for him and that he felt well enough to finally go. Pt expressed gratitude to the hospital staff for his stay here and shared that his long term goal was to move back to [place] to find a steady job. Pt's affect was bright.

## 2012-10-10 NOTE — Progress Notes (Signed)
Adult Psychoeducational Group Note  Date:  10/10/2012 Time:  11:00am Group Topic/Focus:  Personal Choices and Values:   The focus of this group is to help patients assess and explore the importance of values in their lives, how their values affect their decisions, how they express their values and what opposes their expression.  Participation Level:  Active  Participation Quality:  Appropriate  Affect:  Appropriate  Cognitive:  Alert and Appropriate  Insight: Limited  Engagement in Group:  Engaged  Modes of Intervention:  Discussion and Education  Additional Comments:  Pt attended and participated in group. When asked what goal was important to him pt stated being treated fairly. When asked why it meant so much to him pt stated he wants to be treated like he treats others.  Shelly Bombard D 10/10/2012, 1:40 PM

## 2012-10-10 NOTE — Progress Notes (Signed)
D: Pt is bright in affect and appropriate in mood. Pt attended group this evening. Pt observed interacting appropriately within the milieu. Pt is denying any SI/HI/AVH. Pt is also denying any depression. Pt says that he is feeling good and is ready to go home. His plan for discharge is to take his meds and to follow-up with his appointment. 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  A: Writer administered scheduled medications to pt. Continued support and availability as needed was extended to this pt. Staff continue to monitor pt with q62min checks.  R: No adverse drug reactions noted. Pt receptive to treatment. Pt remains safe at this time.

## 2012-10-10 NOTE — Progress Notes (Signed)
D: Pt is cooperative and appropriate with affect and mood. Interacting well with others in group sessions. Participation in group is well noted. Pt has no complaint at this time will. Patient remains aware about potential discharge tomorrow. Still feels like medications are working.  A: Writer administered scheduled medications to patient. Continue support provided as needed to the patient. Staff continues to monitor q 15 minutes for safety. R: No adverse reactions noted. Pt receptive to treatment. Patient remains safe at this time.

## 2012-10-10 NOTE — BHH Group Notes (Signed)
B[facility] Group Notes:  (Counselor/Nursing/MHT/Case Management/Adjunct)  10/10/2012 1:15PM  Type of Therapy:  Group Therapy  Participation Level:  Did not attend  P  Summary of Progress/Problems: The topic for group was balance in life.  Pt participated in the discussion about when their life was in balance and out of balance and how this feels.  Pt discussed ways to get back in balance and short term goals they can work on to get where they want to be.    Daryel Gerald B 10/10/2012 10:31 AM

## 2012-10-10 NOTE — Progress Notes (Signed)
Recreation Therapy Notes  Date: 07.16.2014 Time: 9:30am Location: [address] Day Room      Group Topic/Focus: Memory  Participation Level: Active  Participation Quality: Appropriate  Affect: Euthymic to bright at times  Cognitive: Oriented  Additional Comments: Activity: Memory Sequence ; Explanation: Patients with LRT and nursing student created a sequence of actions to be completed by each member of the group. The sequence started with one action, after traveling around the circle group members were asked to add an action to the sequence.   Patient actively participated in group activity. Patient was able to repeat sequence with little assistance from LRT. Patient smiled brightly after repeating the sequence in the correct order. Patient shared one method he uses for remembering to do things at home it to write them down.    Marykay Lex Kensley Lares, LRT/CTRS  Jearl Klinefelter 10/10/2012 12:29 PM

## 2012-10-10 NOTE — BHH Group Notes (Signed)
[facility] LCSW Aftercare Discharge Planning Group Note   10/10/2012 10:19 AM  Participation Quality:  Engaged  Mood/Affect:  happy  Depression Rating:  denies  Anxiety Rating:  denies  Thoughts of Suicide:  No Will you contract for safety?   NA  Current AVH:  No  Plan for Discharge/Comments:  [name] mood is good; he denies all symptoms.  States he is ready to go.  I tell him he cannot be discharged until he knows my name.  He starts practicing immediately.    Transportation Means: niece  Supports: niece  [name]

## 2012-10-10 NOTE — Progress Notes (Signed)
D: Pt denies SI/HI/AVH. Pt denies feeling depressed this morning. Pt compliant with treatment, attend groups and taking meds. Pt engaged with other pts on the milieu. No complaints verbalized by pt this morning. Pt presents with fixed smile, fair eye contact an is fidgety. A: Medications administered as ordered per MD. Verbal support given. Pt encouraged to attend groups. 15 minute checks performed for safety. R: Pt remains safe at this time. Pt cooperative and pleasant.

## 2012-10-11 MED ORDER — TRAZODONE HCL 100 MG PO TABS: 100.0000 mg | ORAL_TABLET | Freq: Every day | ORAL | Status: DC

## 2012-10-11 MED ORDER — BENZTROPINE MESYLATE 0.5 MG PO TABS: 0.5000 mg | ORAL_TABLET | Freq: Two times a day (BID) | ORAL | Status: DC

## 2012-10-11 MED ORDER — HALOPERIDOL 5 MG PO TABS: 5.0000 mg | ORAL_TABLET | Freq: Two times a day (BID) | ORAL | Status: DC

## 2012-10-11 NOTE — Discharge Summary (Signed)
Seen and agreed. Nita Whitmire, MD 

## 2012-10-11 NOTE — Progress Notes (Signed)
Adult Psychoeducational Group Note  Date:  10/11/2012 Time:  11:38 AM  Group Topic/Focus:  Overcoming Stress:   The focus of this group is to define stress and help patients assess their triggers.  Participation Level:  Active  Participation Quality:  Appropriate  Affect:  Appropriate  Cognitive:  Appropriate  Insight: Appropriate and Good  Engagement in Group:  Engaged  Modes of Intervention:  Discussion, Education and Support  Additional Comments:  Pt pointed out that stress is the opposite of relaxation. Pt shared that he has coped with stress in the past by cutting grass or washing his car. Pt shared that, after discharging from [facility], he will cope with stress by refocusing on his routine when presented with stressful circumstances.  Reinaldo Raddle K 10/11/2012, 11:38 AM

## 2012-10-11 NOTE — Progress Notes (Signed)
Seen and agreed. Zonie Crutcher, MD 

## 2012-10-11 NOTE — Progress Notes (Signed)
Patient ID: Keith Murray, male   DOB: November 14, 1960, 52 y.o.   MRN: [id number] Pt discharged to his niece at this time. Pt denies SI/HI. Pt provided with supply of medications as well as prescriptions. Pt provided with discharge instructions and pt verbalized understanding, all belongings were returned.

## 2012-10-11 NOTE — Tx Team (Signed)
  Interdisciplinary Treatment Plan Update   Date Reviewed:  10/11/2012  Time Reviewed:  11:39 AM  Progress in Treatment:   Attending groups: Yes Participating in groups: Yes Taking medication as prescribed: Yes  Tolerating medication: Yes Family/Significant other contact made: Yes  Patient understands diagnosis: Yes  Discussing patient identified problems/goals with staff: Yes Medical problems stabilized or resolved: Yes Denies suicidal/homicidal ideation: Yes Patient has not harmed self or others: Yes  For review of initial/current patient goals, please see plan of care.  Estimated Length of Stay:  D/C today  Reason for Continuation of Hospitalization:   New Problems/Goals identified:  N/A  Discharge Plan or Barriers:   return home, follow up [facility]  Additional Comments:  Attendees:  Signature: Thedore Mins, MD 10/11/2012 11:39 AM   Signature: Richelle Ito, LCSW 10/11/2012 11:39 AM  Signature: Verne Spurr, PA 10/11/2012 11:39 AM  Signature: Joslyn Devon, RN 10/11/2012 11:39 AM  Signature:  10/11/2012 11:39 AM  Signature:  10/11/2012 11:39 AM  Signature:   10/11/2012 11:39 AM  Signature:    Signature:    Signature:    Signature:    Signature:    Signature:      Scribe for Treatment Team:   Richelle Ito, LCSW  10/11/2012 11:39 AM

## 2012-10-11 NOTE — Discharge Summary (Signed)
Physician Discharge Summary Note  Patient:  Keith Murray is an 52 y.o., male MRN:  [id number] DOB:  1961-01-22 Patient phone:  [phone] (home)  Patient address:   [address] [place] [address],   Date of Admission:  10/07/2012 Date of Discharge: 10/11/2012  Reason for Admission:  psychosis  Discharge Diagnoses: Principal Problem:   Schizoaffective disorder Active Problems:   Polysubstance (excluding opioids) dependence  Review of Systems  Constitutional: Negative.  Negative for fever, chills, weight loss, malaise/fatigue and diaphoresis.  HENT: Negative for congestion and sore throat.   Eyes: Negative for blurred vision, double vision and photophobia.  Respiratory: Negative for cough, shortness of breath and wheezing.   Cardiovascular: Negative for chest pain, palpitations and PND.  Gastrointestinal: Negative for heartburn, nausea, vomiting, abdominal pain, diarrhea and constipation.  Musculoskeletal: Negative for myalgias, joint pain and falls.  Neurological: Negative for dizziness, tingling, tremors, sensory change, speech change, focal weakness, seizures, loss of consciousness, weakness and headaches.  Endo/Heme/Allergies: Negative for polydipsia. Does not bruise/bleed easily.  Psychiatric/Behavioral: Negative for depression, suicidal ideas, hallucinations, memory loss and substance abuse. The patient is not nervous/anxious and does not have insomnia.   Psychological Evaluations:  Assessment:  AXIS I: Schizoaffective Disorder  Polysubstance abuse  AXIS II: Deferred  AXIS III:  Past Medical History   Diagnosis  Date   .  Mental disorder     AXIS IV: other psychosocial or environmental problems, problems related to social environment and non-compliant with medications  AXIS V: 31-40 impairment in reality testing  Level of Care:  OP  Hospital Course:           Naziah Weckerly was admitted voluntarily after presenting to the ED brought in by police after being found  wandering around outside in his underwear in an altered mental state. He was evaluated by the ED and given medical clearance before being transferred to [facility] for stabilization and treatment.     Liz Beach was seen and evaluated by the Treatment team consisting of Psychiatrist, PAC, RN, Case Manager, and Therapist for evaluation and treatment plan with goal of stabilization upon discharge  Multiple modalities of treatment were used including medication management, individual and group therapies, unit programming, improved nutrition, physical activity, and family sessions as needed.     The symptoms of Hallucinations,  Auditory Hallucinations and Visual Hallucinations  Impulsivity Insomnia and Substance abuse were monitored daily with evaluation by clinical provider. Daily mental and emotional status was reported by a daily self inventory completed by the patient. Observational and interactive notes were documented each day by the clinical providers, nursing staff, mental health techs, case managers, and recreational therapists to provide regular updates on this patient's status and progress. Any concerns were identified and addressed.      Improvement was demonstrated by declining numbers on the self assessment, improving vital signs, increased cognition, and improvement in mood, sleep, appetite as well as a reduction in physical symptoms.       The patient was evaluated and found to be stable enough for discharge and was released to return home per the initial plan of treatment.   Mental Status Exam:  For mental status exam please see mental status exam and suicide risk assessment completed by attending physician prior to discharge.  Consults:  None  Significant Diagnostic Studies:  None  Discharge Vitals:   Blood pressure 103/72, pulse 106, temperature 97.6 F (36.4 C), temperature source Oral, resp. rate 24, height 6' (1.829 m), weight 71.668 kg (158  lb), SpO2 100.00%. Body mass index is 21.42  kg/(m^2). Lab Results:   No results found for this or any previous visit (from the past 72 hour(s)).  Physical Findings: AIMS: Facial and Oral Movements Muscles of Facial Expression: None, normal Lips and Perioral Area: None, normal Jaw: None, normal Tongue: None, normal,Extremity Movements Upper (arms, wrists, hands, fingers): None, normal Lower (legs, knees, ankles, toes): None, normal, Trunk Movements Neck, shoulders, hips: None, normal, Overall Severity Severity of abnormal movements (highest score from questions above): None, normal Incapacitation due to abnormal movements: None, normal Patient's awareness of abnormal movements (rate only patient's report): No Awareness, Dental Status Current problems with teeth and/or dentures?: Yes Does patient usually wear dentures?: No  CIWA:  CIWA-Ar Total: 0 COWS:     Psychiatric Specialty Exam: See Psychiatric Specialty Exam and Suicide Risk Assessment completed by Attending Physician prior to discharge.  Discharge destination:  Home  Is patient on multiple antipsychotic therapies at discharge:  No   Has Patient had three or more failed trials of antipsychotic monotherapy by history:  No  Recommended Plan for Multiple Antipsychotic Therapies:   Discharge Orders   Future Orders Complete By Expires     Diet - low sodium heart healthy  As directed     Discharge instructions  As directed     Comments:      Take all of your medications as directed. Be sure to keep all of your follow up appointments.  If you are unable to keep your follow up appointment, call your Doctor's office to let them know, and reschedule.  Make sure that you have enough medication to last until your appointment. Be sure to get plenty of rest. Going to bed at the same time each night will help. Try to avoid sleeping during the day.  Increase your activity as tolerated. Regular exercise will help you to sleep better and improve your mental health. Eating a heart  healthy diet is recommended. Try to avoid salty or fried foods. Be sure to avoid all alcohol and illegal drugs.    Increase activity slowly  As directed         Medication List       Indication   benztropine 0.5 MG tablet  Commonly known as:  COGENTIN  Take 1 tablet (0.5 mg total) by mouth 2 (two) times daily. For side effects.   Indication:  Extrapyramidal Reaction caused by Medications     haloperidol 5 MG tablet  Commonly known as:  HALDOL  Take 1 tablet (5 mg total) by mouth 2 (two) times daily. For mental clarity and psychosis.   Indication:  Psychosis     traZODone 100 MG tablet  Commonly known as:  DESYREL  Take 1 tablet (100 mg total) by mouth at bedtime. For insomnia   Indication:  Trouble Sleeping           Follow-up Information   Follow up with [facility] On 10/16/2012. (1:00 on Tuesday with Trey Paula)    Contact information:   [address]  [address]  [place]  [phone]      Follow-up recommendations:   Activities: Resume activity as tolerated. Diet: Heart healthy low sodium diet Tests: Follow up testing will be determined by your out patient provider.  Comments:    Total Discharge Time:  Greater than 30 minutes.  Signed: Rona Ravens. Sama Arauz RPAC 9:27 AM 10/11/2012

## 2012-10-11 NOTE — Progress Notes (Signed)
[facility] Adult Case Management Discharge Plan :  Will you be returning to the same living situation after discharge: Yes,  home At discharge, do you have transportation home?:Yes,  niece Do you have the ability to pay for your medications:Yes,  [facility]  Release of information consent forms completed and in the chart;  Patient's signature needed at discharge.  Patient to Follow up at: Follow-up Information   Follow up with [facility] On 10/16/2012. (1:00 on Tuesday with Trey Paula)    Contact information:   [address]  [address]  [place]  [phone]      Patient denies SI/HI:   Yes,  yes    Safety Planning and Suicide Prevention discussed:  Yes,  yes  [name] 10/11/2012, 11:43 AM

## 2012-10-11 NOTE — [facility] Group Notes (Signed)
[facility] LCSW Group Therapy  10/11/2012  1:05 PM  Type of Therapy:  Group therapy  Participation Level:  Active  Participation Quality:  Attentive  Affect:  Flat  Cognitive:  Oriented  Insight:  Limited  Engagement in Therapy:  Limited  Modes of Intervention:  Discussion, Socialization  Summary of Progress/Problems:  Chaplain was here to lead a group on themes of hope and courage. Carrell is very concrete, so was able to talk about these concepts on a very primitive level.  He either repeated what others were saying, or repeated himself. [name] 10/11/2012 5:18 PM

## 2012-10-11 NOTE — [facility] Suicide Risk Assessment (Signed)
[facility] INPATIENT:  Family/Significant Other Suicide Prevention Education  Suicide Prevention Education:  Education Completed; No one has been identified by the patient as the family member/significant other with whom the patient will be residing, and identified as the person(s) who will aid the patient in the event of a mental health crisis (suicidal ideations/suicide attempt).  With written consent from the patient, the family member/significant other has been provided the following suicide prevention education, prior to the and/or following the discharge of the patient.  The suicide prevention education provided includes the following:  Suicide risk factors  Suicide prevention and interventions  National Suicide Hotline telephone number  [facility] assessment telephone number  [facility] Emergency Assistance 911  [facility] and/or Residential Mobile Crisis Unit telephone number  Request made of family/significant other to:  Remove weapons (e.g., guns, rifles, knives), all items previously/currently identified as safety concern.    Remove drugs/medications (over-the-counter, prescriptions, illicit drugs), all items previously/currently identified as a safety concern.  The family member/significant other verbalizes understanding of the suicide prevention education information provided.  The family member/significant other agrees to remove the items of safety concern listed above.The patient did not endorse SI at the time of admission, nor did the patient c/o SI during the stay here.  SPE not required.    Daryel Gerald B 10/11/2012, 11:42 AM

## 2012-10-11 NOTE — [facility] Suicide Risk Assessment (Signed)
Suicide Risk Assessment  Discharge Assessment     Demographic Factors:  Male, Low socioeconomic status and Unemployed  Mental Status Per Nursing Assessment::   On Admission:     Current Mental Status by Physician: patient denies suicidal ideations, intent or plan.  Loss Factors: Financial problems/change in socioeconomic status  Historical Factors: Impulsivity  Risk Reduction Factors:   Sense of responsibility to family, Living with another person, especially a relative and Positive social support  Continued Clinical Symptoms:  Alcohol/Substance Abuse/Dependencies  Cognitive Features That Contribute To Risk:  Closed-mindedness Polarized thinking    Suicide Risk:  Minimal: No identifiable suicidal ideation.  Patients presenting with no risk factors but with morbid ruminations; may be classified as minimal risk based on the severity of the depressive symptoms  Discharge Diagnoses:   AXIS I:  Schizoaffective disorder              Polysubstance abuse  AXIS II:  Deferred AXIS III:   Past Medical History  Diagnosis Date  . Mental disorder    AXIS IV:  other psychosocial or environmental problems and problems related to social environment AXIS V:  61-70 mild symptoms  Plan Of Care/Follow-up recommendations:  Activity:  as tolerated Diet:  healthy Tests:  routine Other:  patient to keep his after care appointment  Is patient on multiple antipsychotic therapies at discharge:  No   Has Patient had three or more failed trials of antipsychotic monotherapy by history:  No  Recommended Plan for Multiple Antipsychotic Therapies: N/A  Anelise Staron,MD 10/11/2012, 10:35 AM

## 2012-10-11 NOTE — Progress Notes (Signed)
Pt to be discharged this afternoon at 1700, "when my sister gets off work." Mood stable, denies SI/HI. Pt is happy, and smiling, excited to be going home.

## 2012-10-16 NOTE — Progress Notes (Signed)
Patient Discharge Instructions:  After Visit Summary (AVS):   Faxed to:  10/16/12 Discharge Summary Note:   Faxed to:  10/16/12 Psychiatric Admission Assessment Note:   Faxed to:  10/16/12 Suicide Risk Assessment - Discharge Assessment:   Faxed to:  10/16/12 Faxed/Sent to the Next Level Care provider:  10/16/12 Faxed to [facility] @ [phone]  Jerelene Redden, 10/16/2012, 4:14 PM

## 2013-06-08 ENCOUNTER — Encounter (HOSPITAL_COMMUNITY): Payer: Self-pay | Admitting: Emergency Medicine

## 2013-06-08 ENCOUNTER — Inpatient Hospital Stay (HOSPITAL_COMMUNITY)
Admission: AD | Admit: 2013-06-08 | Discharge: 2013-06-14 | DRG: 885 | Disposition: A | Discharge status: Home / Self Care | Payer: 59 | Source: Intra-hospital | Attending: Psychiatry | Admitting: Psychiatry

## 2013-06-08 ENCOUNTER — Emergency Department (HOSPITAL_COMMUNITY)
Admission: EM | Admit: 2013-06-08 | Discharge: 2013-06-08 | Disposition: A | Discharge status: Other Acute Inpatient Hospital | Payer: 59 | Attending: Emergency Medicine | Admitting: Emergency Medicine

## 2013-06-08 ENCOUNTER — Encounter (HOSPITAL_COMMUNITY): Payer: Self-pay | Admitting: *Deleted

## 2013-06-08 DIAGNOSIS — F911 Conduct disorder, childhood-onset type: Secondary | ICD-10-CM | POA: Insufficient documentation

## 2013-06-08 DIAGNOSIS — F29 Unspecified psychosis not due to a substance or known physiological condition: Secondary | ICD-10-CM

## 2013-06-08 DIAGNOSIS — R030 Elevated blood-pressure reading, without diagnosis of hypertension: Secondary | ICD-10-CM | POA: Diagnosis present

## 2013-06-08 DIAGNOSIS — F17200 Nicotine dependence, unspecified, uncomplicated: Secondary | ICD-10-CM | POA: Insufficient documentation

## 2013-06-08 DIAGNOSIS — F1920 Other psychoactive substance dependence, uncomplicated: Secondary | ICD-10-CM

## 2013-06-08 DIAGNOSIS — F10229 Alcohol dependence with intoxication, unspecified: Secondary | ICD-10-CM | POA: Diagnosis present

## 2013-06-08 DIAGNOSIS — F411 Generalized anxiety disorder: Secondary | ICD-10-CM | POA: Insufficient documentation

## 2013-06-08 DIAGNOSIS — F909 Attention-deficit hyperactivity disorder, unspecified type: Secondary | ICD-10-CM

## 2013-06-08 DIAGNOSIS — R000 Tachycardia, unspecified: Secondary | ICD-10-CM | POA: Insufficient documentation

## 2013-06-08 DIAGNOSIS — F10939 Alcohol use, unspecified with withdrawal, unspecified: Secondary | ICD-10-CM | POA: Diagnosis present

## 2013-06-08 DIAGNOSIS — F1010 Alcohol abuse, uncomplicated: Secondary | ICD-10-CM

## 2013-06-08 DIAGNOSIS — F319 Bipolar disorder, unspecified: Secondary | ICD-10-CM | POA: Insufficient documentation

## 2013-06-08 DIAGNOSIS — F10239 Alcohol dependence with withdrawal, unspecified: Secondary | ICD-10-CM | POA: Diagnosis present

## 2013-06-08 DIAGNOSIS — F1410 Cocaine abuse, uncomplicated: Secondary | ICD-10-CM | POA: Diagnosis present

## 2013-06-08 DIAGNOSIS — F259 Schizoaffective disorder, unspecified: Secondary | ICD-10-CM | POA: Diagnosis present

## 2013-06-08 DIAGNOSIS — Z79899 Other long term (current) drug therapy: Secondary | ICD-10-CM | POA: Insufficient documentation

## 2013-06-08 DIAGNOSIS — Z0289 Encounter for other administrative examinations: Secondary | ICD-10-CM | POA: Insufficient documentation

## 2013-06-08 DIAGNOSIS — F1020 Alcohol dependence, uncomplicated: Secondary | ICD-10-CM | POA: Diagnosis present

## 2013-06-08 HISTORY — DX: Anxiety disorder, unspecified: F41.9

## 2013-06-08 LAB — COMPREHENSIVE METABOLIC PANEL
ALT: 10 U/L (ref 0–53)
AST: 17 U/L (ref 0–37)
Albumin: 4.5 g/dL (ref 3.5–5.2)
Alkaline Phosphatase: 100 U/L (ref 39–117)
BUN: 7 mg/dL (ref 6–23)
CO2: 20 mEq/L (ref 19–32)
Calcium: 9.1 mg/dL (ref 8.4–10.5)
Chloride: 105 mEq/L (ref 96–112)
Creatinine, Ser: 0.86 mg/dL (ref 0.50–1.35)
GFR calc Af Amer: >90 mL/min (ref >90)
GFR calc non Af Amer: >90 mL/min (ref >90)
Glucose, Bld: 99 mg/dL (ref 70–99)
Potassium: 4.5 mEq/L (ref 3.7–5.3)
Sodium: 143 mEq/L (ref 137–147)
Total Bilirubin: 0.3 mg/dL (ref 0.3–1.2)
Total Protein: 8.4 g/dL — ABNORMAL HIGH (ref 6.0–8.3)

## 2013-06-08 LAB — CBC
HCT: 45.7 % (ref 39.0–52.0)
Hemoglobin: 16.1 g/dL (ref 13.0–17.0)
MCH: 31.5 pg (ref 26.0–34.0)
MCHC: 35.2 g/dL (ref 30.0–36.0)
MCV: 89.4 fL (ref 78.0–100.0)
Platelets: 226 10*3/uL (ref 150–400)
RBC: 5.11 MIL/uL (ref 4.22–5.81)
RDW: 14 % (ref 11.5–15.5)
WBC: 5.7 10*3/uL (ref 4.0–10.5)

## 2013-06-08 LAB — RAPID URINE DRUG SCREEN, HOSP PERFORMED
Amphetamines: NOT DETECTED
Barbiturates: NOT DETECTED
Benzodiazepines: NOT DETECTED
Cocaine: POSITIVE — AB
Opiates: NOT DETECTED
Tetrahydrocannabinol: NOT DETECTED

## 2013-06-08 LAB — ETHANOL: Alcohol, Ethyl (B): 321 mg/dL — ABNORMAL HIGH (ref 0–11)

## 2013-06-08 MED ORDER — NICOTINE 21 MG/24HR TD PT24
21.0000 mg | MEDICATED_PATCH | Freq: Every day | TRANSDERMAL | Status: DC
  Administered 2013-06-08: 21 mg via TRANSDERMAL
  Filled 2013-06-08: qty 1

## 2013-06-08 MED ORDER — BENZTROPINE MESYLATE 0.5 MG PO TABS
0.5000 mg | ORAL_TABLET | Freq: Two times a day (BID) | ORAL | Status: DC
  Administered 2013-06-08 – 2013-06-14 (×12): 0.5 mg via ORAL
  Filled 2013-06-08 (×13): qty 1
  Filled 2013-06-08: qty 6
  Filled 2013-06-08 (×2): qty 1
  Filled 2013-06-08: qty 6

## 2013-06-08 MED ORDER — TRAZODONE HCL 100 MG PO TABS: 100.0000 mg | ORAL_TABLET | Freq: Every day | ORAL | Status: DC

## 2013-06-08 MED ORDER — MAGNESIUM HYDROXIDE 400 MG/5ML PO SUSP: 30.0000 mL | Freq: Every day | ORAL | Status: DC | PRN

## 2013-06-08 MED ORDER — ONDANSETRON HCL 4 MG PO TABS: 4.0000 mg | ORAL_TABLET | Freq: Three times a day (TID) | ORAL | Status: DC | PRN

## 2013-06-08 MED ORDER — ACETAMINOPHEN 325 MG PO TABS
650.0000 mg | ORAL_TABLET | ORAL | Status: DC | PRN
  Administered 2013-06-08: 650 mg via ORAL
  Filled 2013-06-08: qty 2

## 2013-06-08 MED ORDER — HALOPERIDOL 5 MG PO TABS: 5.0000 mg | ORAL_TABLET | Freq: Two times a day (BID) | ORAL | Status: DC

## 2013-06-08 MED ORDER — HYDROCHLOROTHIAZIDE 12.5 MG PO CAPS
12.5000 mg | ORAL_CAPSULE | Freq: Once | ORAL | Status: AC
  Administered 2013-06-08: 12.5 mg via ORAL
  Filled 2013-06-08: qty 1

## 2013-06-08 MED ORDER — CHLORDIAZEPOXIDE HCL 25 MG PO CAPS: 25.0000 mg | ORAL_CAPSULE | Freq: Three times a day (TID) | ORAL | Status: DC | PRN

## 2013-06-08 MED ORDER — ZOLPIDEM TARTRATE 5 MG PO TABS: 5.0000 mg | ORAL_TABLET | Freq: Every evening | ORAL | Status: DC | PRN

## 2013-06-08 MED ORDER — LISINOPRIL 10 MG PO TABS
10.0000 mg | ORAL_TABLET | Freq: Every day | ORAL | Status: DC
  Administered 2013-06-09 – 2013-06-14 (×6): 10 mg via ORAL
  Filled 2013-06-08 (×3): qty 1
  Filled 2013-06-08: qty 3
  Filled 2013-06-08 (×4): qty 1

## 2013-06-08 MED ORDER — STERILE WATER FOR INJECTION IJ SOLN
INTRAMUSCULAR | Status: AC
  Filled 2013-06-08: qty 10

## 2013-06-08 MED ORDER — ALUM & MAG HYDROXIDE-SIMETH 200-200-20 MG/5ML PO SUSP: 30.0000 mL | ORAL | Status: DC | PRN

## 2013-06-08 MED ORDER — PNEUMOCOCCAL VAC POLYVALENT 25 MCG/0.5ML IJ INJ
0.5000 mL | INJECTION | INTRAMUSCULAR | Status: AC
  Administered 2013-06-09: 0.5 mL via INTRAMUSCULAR

## 2013-06-08 MED ORDER — LORAZEPAM 1 MG PO TABS: 1.0000 mg | ORAL_TABLET | Freq: Three times a day (TID) | ORAL | Status: DC | PRN

## 2013-06-08 MED ORDER — LISINOPRIL 10 MG PO TABS
10.0000 mg | ORAL_TABLET | Freq: Once | ORAL | Status: AC
  Administered 2013-06-08: 10 mg via ORAL
  Filled 2013-06-08: qty 1

## 2013-06-08 MED ORDER — HALOPERIDOL 5 MG PO TABS
5.0000 mg | ORAL_TABLET | Freq: Two times a day (BID) | ORAL | Status: DC
  Administered 2013-06-08 – 2013-06-14 (×12): 5 mg via ORAL
  Filled 2013-06-08 (×4): qty 1
  Filled 2013-06-08: qty 6
  Filled 2013-06-08 (×4): qty 1
  Filled 2013-06-08: qty 6
  Filled 2013-06-08 (×7): qty 1

## 2013-06-08 MED ORDER — ZIPRASIDONE MESYLATE 20 MG IM SOLR
10.0000 mg | Freq: Once | INTRAMUSCULAR | Status: AC
  Administered 2013-06-08: 10 mg via INTRAMUSCULAR
  Filled 2013-06-08: qty 20

## 2013-06-08 MED ORDER — IBUPROFEN 200 MG PO TABS: 600.0000 mg | ORAL_TABLET | Freq: Three times a day (TID) | ORAL | Status: DC | PRN

## 2013-06-08 MED ORDER — HYDROCHLOROTHIAZIDE 25 MG PO TABS
25.0000 mg | ORAL_TABLET | Freq: Every day | ORAL | Status: DC
  Administered 2013-06-09: 25 mg via ORAL
  Filled 2013-06-08 (×2): qty 1

## 2013-06-08 MED ORDER — TRAZODONE HCL 100 MG PO TABS
100.0000 mg | ORAL_TABLET | Freq: Every day | ORAL | Status: DC
  Administered 2013-06-08 – 2013-06-13 (×5): 100 mg via ORAL
  Filled 2013-06-08 (×5): qty 1
  Filled 2013-06-08: qty 3
  Filled 2013-06-08 (×3): qty 1

## 2013-06-08 MED ORDER — INFLUENZA VAC SPLIT QUAD 0.5 ML IM SUSP
0.5000 mL | INTRAMUSCULAR | Status: AC
  Administered 2013-06-09: 0.5 mL via INTRAMUSCULAR
  Filled 2013-06-08: qty 0.5

## 2013-06-08 MED ORDER — HYDROCHLOROTHIAZIDE 25 MG PO TABS: 25.0000 mg | ORAL_TABLET | Freq: Every day | ORAL | Status: DC

## 2013-06-08 MED ORDER — LISINOPRIL 10 MG PO TABS: 10.0000 mg | ORAL_TABLET | Freq: Every day | ORAL | Status: DC

## 2013-06-08 NOTE — ED Provider Notes (Signed)
CSN: [id number]     Arrival date & time 06/08/13  7628 History   First MD Initiated Contact with Patient 06/08/13 [phone]     Chief Complaint  Patient presents with  . Medical Clearance     (Consider location/radiation/quality/duration/timing/severity/associated sxs/prior Treatment) HPI Comments: Patient is a 53 year old male with a history of mental disorder who presents to the emergency department for behavior change. Police called to patient's sisters home tonight as patient was not acting like himself. Sister states that patient has been noncompliant with his psychiatric medication. Per patient, he states that he use to get shots for management of his psychiatric conditions. He states that "that black nigger changed by meds to pills and I can't breathe with those". Patient denies SI/HI as well as A/V hallucinations. +ETOH and cocaine.  The history is provided by the patient. No language interpreter was used.    Past Medical History  Diagnosis Date  . Mental disorder    Past Surgical History  Procedure Laterality Date  . No past surgeries     History reviewed. No pertinent family history. History  Substance Use Topics  . Smoking status: Current Every Day Smoker -- 1.50 packs/day  . Smokeless tobacco: Not on file  . Alcohol Use: Yes     Comment: Unk amt; pt refused     Review of Systems  Psychiatric/Behavioral: Positive for behavioral problems. Negative for suicidal ideas and hallucinations.  All other systems reviewed and are negative.     Allergies  Review of patient's allergies indicates no known allergies.  Home Medications   Current Outpatient Rx  Name  Route  Sig  Dispense  Refill  . benztropine (COGENTIN) 0.5 MG tablet   Oral   Take 1 tablet (0.5 mg total) by mouth 2 (two) times daily. For side effects.   60 tablet   0   . haloperidol (HALDOL) 5 MG tablet   Oral   Take 1 tablet (5 mg total) by mouth 2 (two) times daily. For mental clarity and psychosis.  60 tablet   0   . traZODone (DESYREL) 100 MG tablet   Oral   Take 1 tablet (100 mg total) by mouth at bedtime. For insomnia   30 tablet   0    BP 150/118  Pulse 114  Temp(Src) 98.4 F (36.9 C) (Oral)  Resp 16  SpO2 95%  Physical Exam  Nursing note and vitals reviewed. Constitutional: He is oriented to person, place, and time. He appears well-developed and well-nourished. No distress.  HENT:  Head: Normocephalic and atraumatic.  Eyes: Conjunctivae and EOM are normal. No scleral icterus.  Neck: Normal range of motion.  Cardiovascular: Regular rhythm and normal heart sounds.   Mildly tachycardic rate  Pulmonary/Chest: Effort normal and breath sounds normal. No respiratory distress. He has no wheezes. He has no rales.  Abdominal: Soft. He exhibits no distension. There is no tenderness.  Musculoskeletal: Normal range of motion.  Neurological: He is alert and oriented to person, place, and time.  Skin: Skin is warm and dry. No rash noted. He is not diaphoretic. No erythema. No pallor.  Psychiatric: His speech is rapid and/or pressured. He is hyperactive. Cognition and memory are normal. He expresses impulsivity. He expresses no homicidal and no suicidal ideation. He expresses no suicidal plans and no homicidal plans.    ED Course  Procedures (including critical care time) Labs Review Labs Reviewed  COMPREHENSIVE METABOLIC PANEL - Abnormal; Notable for the following:    Total Protein  8.4 (*)    All other components within normal limits  ETHANOL - Abnormal; Notable for the following:    Alcohol, Ethyl (B) 321 (*)    All other components within normal limits  URINE RAPID DRUG SCREEN (HOSP PERFORMED) - Abnormal; Notable for the following:    Cocaine POSITIVE (*)    All other components within normal limits  CBC   Imaging Review No results found.   EKG Interpretation None      MDM   Final diagnoses:  Hyperactivity (behavior)    53 year old male with a history of  mental disorders presents via [facility] for behavior change. Patient has not been acting like himself and sister endorses noncompliance with psychiatric medication. Patient is very hyperactive with rapid and pressured speech. He exhibits impulsivity and behavior drastically changes without warning (i.e. Patient will go from crying to throwing things). He is + for ETOH and cocaine today. Patient otherwise medically cleared and pending TTS eval for further evaluation of his behavior change/psychosis. Patient to be dispositioned by oncoming ED provider.     Antonietta Breach, PA-C 06/08/13 [phone]

## 2013-06-08 NOTE — Psychosocial Assessment (Signed)
Psychoeducational Group Note  Date:  06/08/2013 Time:  8:00 p.m.   Group Topic/Focus:  Wrap-Up Group:   The focus of this group is to help patients review their daily goal of treatment and discuss progress on daily workbooks.  Participation Level: Did Not Attend  Participation Quality:  Not Applicable  Affect:  Not Applicable  Cognitive:  Not Applicable  Insight:  Not Applicable  Engagement in Group: Not Applicable  Additional Comments:  The patient didn't attend group this evening since he was asleep.   Archie Balboa S 06/08/2013, 9:18 PM

## 2013-06-08 NOTE — BH Assessment (Signed)
Clinician consulted with Serena Colonel, NP who recommends pt be assessed by psychiatry in the morning due to BAL at 321 and pt given Geodon at 0545.  Clinician contacted Dr. Sharol Given with recommendation.   Fredonia Highland, MSW, LCSW Triage Specialist [phone]

## 2013-06-08 NOTE — ED Notes (Signed)
[facility] here to transport to [facility]. Ambulatory without difficulty. Denies pain. No complaints voiced. Denies SI/HI and AVH. Belongings bag x1 given to [employer] driver. M[facility] escorted to [facility].

## 2013-06-08 NOTE — BH Assessment (Signed)
Tele Assessment Note   Keith Murray is an 53 y.o. male who presents to [facility] after being transported by [employer] due to sister calling stating pt was not acting like himself.  When asked why he was at ED pt reported "they keep changing my medication and I can't breathe. I did the best I could."  Pt displayed liable affect as he went from being very aggressive to becoming tearful and sad. Pt was Ox1, only being oriented to self. Upon assessment pt was agitated and aggressive but was apologetic to clinician. Pt presented responding to internal stimuli pacing the room yelling and arguing making statements such as "Imma kill as many as I can."  Pt would not refer who he was talking to but reported "I'm upset! They disrespecting me."  Pt had difficulty concentrating and responding to questions during assessment. Pt reported "I can't sleep and I can't eat." Pt reported mood was upset and presented with liable affect.  Pt presented with flight of ideas going back and forth between topics. Pt denied current SI. Pt reported using alcohol recently as yesterday but pt was also positive for cocaine.  Pt ended assessment saying "I'm done now. I'm going to sleep. Love you."   Pt has inpatient hx at [facility] with most recent 09/2012.  Pt is receiving outpatient services at [facility].   Pt was assessed at 0550 at given [place] at 0545.  Pt BAL was 321 at time of assessment. Pt to be evaluated by psychiatrist.   Axis I: Schizoaffective Disorder (by history) Axis II: Deferred Axis III:  Past Medical History  Diagnosis Date  . Mental disorder    Axis IV: other psychosocial or environmental problems, problems with access to health care services and problems with primary support group Axis V: 25  Past Medical History:  Past Medical History  Diagnosis Date  . Mental disorder     Past Surgical History  Procedure Laterality Date  . No past surgeries      Family History: History reviewed. No pertinent family  history.  Social History:  reports that he has been smoking.  He does not have any smokeless tobacco history on file. He reports that he drinks alcohol. He reports that he uses illicit drugs (Cocaine and Marijuana).  Additional Social History:  Alcohol / Drug Use Pain Medications: UTA Prescriptions: UTA Over the Counter: UTA History of alcohol / drug use?: Yes Substance #1 Name of Substance 1: ETOH 1 - Age of First Use: unk 1 - Amount (size/oz): unk 1 - Frequency: unk 1 - Duration: unk 1 - Last Use / Amount: 06/07/13 Substance #2 Name of Substance 2: Cocaine 2 - Age of First Use: unk 2 - Amount (size/oz): unk 2 - Frequency: unk 2 - Duration: unk 2 - Last Use / Amount: unk  CIWA: CIWA-Ar BP: 150/118 mmHg Pulse Rate: 114 COWS:    Allergies: No Known Allergies  Home Medications:  (Not in a hospital admission)  OB/GYN Status:  No LMP for male patient.  General Assessment Data Location of Assessment: [facility] ED Is this a Tele or Face-to-Face Assessment?: Tele Assessment Is this an Initial Assessment or a Re-assessment for this encounter?: Initial Assessment Living Arrangements: Other relatives Can pt return to current living arrangement?: Yes Admission Status: Voluntary Is patient capable of signing voluntary admission?: Yes Transfer from: [facility] Referral Source: Self/Family/Friend  Medical Screening Exam ([place]) Medical Exam completed: No (NA) Reason for MSE not completed:  (NA)  [place]  Plan Living Arrangements: Other relatives Name of Psychiatrist:  Consulting civil engineer) Name of Therapist:  (unk)     Risk to self Suicidal Ideation: No Suicidal Intent: No Is patient at risk for suicide?:  (UTA) Suicidal Plan?:  (UTA) Access to Means:  (UTA) What has been your use of drugs/alcohol within the last 12 months?:  (etoh and cocaine) Previous Attempts/Gestures:  (UTA) How many times?:  (UTA) Other Self Harm Risks:  (UTA) Triggers for Past Attempts:  Unknown Intentional Self Injurious Behavior:  (UTA) Family Suicide History: Unable to assess Recent stressful life event(s):  (UTA) Persecutory voices/beliefs?: Yes Depression:  (UTA) Substance abuse history and/or treatment for substance abuse?: Yes Suicide prevention information given to non-admitted patients: Not applicable  Risk to Others Homicidal Ideation: Yes-Currently Present Thoughts of Harm to Others: Yes-Currently Present Comment - Thoughts of Harm to Others:  (Pt reported "Ima kill as many as I can.") Current Homicidal Intent: Yes-Currently Present Current Homicidal Plan: No Access to Homicidal Means:  (UTA) Identified Victim:  (UTA) History of harm to others?: Yes Assessment of Violence: In past 6-12 months Violent Behavior Description:  (Pt was agitated and aggressive during assessment. ) Does patient have access to weapons?:  (UTA) Criminal Charges Pending?:  (UTA) Does patient have a court date:  (UTA)  Psychosis Hallucinations: Visual (Pt presented to be responding internal stumuli. ) Delusions: Persecutory  Mental Status Report Appear/Hygiene: Other (Comment) (Pt was wearing paper scrubs. ) Eye Contact: Poor Motor Activity: Agitation Speech: Aggressive Level of Consciousness: Irritable Mood: Other (Comment) (Pt reported he was feeling upset. ) Affect: Labile;Angry;Apprehensive (Suspicious) Anxiety Level: Severe Thought Processes: Flight of Ideas Judgement: Impaired Orientation: Person Obsessive Compulsive Thoughts/Behaviors: Severe  Cognitive Functioning Concentration: Decreased Memory: Recent Impaired;Remote Impaired IQ: Average Insight: Poor Impulse Control: Poor Appetite: Poor Weight Loss:  (unk) Weight Gain:  (unk) Sleep: Decreased Total Hours of Sleep:  (unk) Vegetative Symptoms: None  ADLScreening [facility] Assessment Services) Patient's cognitive ability adequate to safely complete daily activities?: Yes Patient able to express need for  assistance with ADLs?: Yes Independently performs ADLs?: Yes (appropriate for developmental age)  Prior Inpatient Therapy Prior Inpatient Therapy: Yes Prior Therapy Dates:  (09/2012, 07/2012) Prior Therapy Facilty/Provider(s):  ([facility]) Reason for Treatment:  (psychosis)  Prior Outpatient Therapy Prior Outpatient Therapy: Yes Prior Therapy Dates:  (current) Prior Therapy Facilty/Provider(s):  Consulting civil engineer) Reason for Treatment:  (medication management)  ADL Screening (condition at time of admission) Patient's cognitive ability adequate to safely complete daily activities?: Yes Patient able to express need for assistance with ADLs?: Yes Independently performs ADLs?: Yes (appropriate for developmental age)         Values / Beliefs Cultural Requests During Hospitalization: None Spiritual Requests During Hospitalization: None        Additional Information 1:1 In Past 12 Months?: No CIRT Risk: Yes Elopement Risk: No Does patient have medical clearance?: No    Disposition: Clinician consulted with Serena Colonel, NP who recommends pt be assessed by psychiatry in the morning. Fredonia Highland, MSW, LCSW Triage Specialist [phone]  Disposition Initial Assessment Completed for this Encounter: Yes Disposition of Patient: Other dispositions Other disposition(s): Other (Comment) (Per Serena Colonel pt to evaluated by psych later in the AM.)  Stewart,Shaheem Pichon R 06/08/2013 6:56 AM

## 2013-06-08 NOTE — ED Notes (Signed)
Report called to [place] at [facility]. Dr.Akintayo accepted to room 403-1. Will notify [facility] of transportation needed to [facility].

## 2013-06-08 NOTE — BH Assessment (Signed)
Nance Pear, PA to gather report prior to assessing pt. She reports pt denying SI, HI and AVH but pt's sister called [employer] stating pt was not acting like himself. Pt has inpatient hx with [facility]. Tele-assessment machine being utilized currently. Assessment to be initiated once machine is available.  Fredonia Highland, MSW, LCSW Triage Specialist [phone]

## 2013-06-08 NOTE — Progress Notes (Signed)
Patient ID: Keith Murray, male   DOB: 04-21-1960, 53 y.o.   MRN: [id number] Patient with mild psychosis, schizoaffective disorder, and alcohol abuse.  MD feels he needs help with alcohol detox since he plans on cessation.  Asa Saunas. Lord, [place] 06/08/2013  Reviewed the information documented and agree with the treatment plan.  Yalena Colon,JANARDHAHA R. 06/08/2013 7:21 PM

## 2013-06-08 NOTE — Progress Notes (Signed)
Patient ID: Keith Murray, male   DOB: [birth year], 53 y.o.   MRN: [id number] D:  53 year old black male admitted from [facility].  Was brought in by police because his family stated he was acting bizarre and had been off of medications for a while, unsure of how long.  States he receives medications from [place].  Patient was intoxicated on admission to ED, but states he only drinks about once every couple of months.  Also using cocaine and THC, but again patient states that he does not use daily.  He was oriented to the unit and his surroundings.  He was given a cold drink.    A:  Admission completed with patient.  Educated to unit schedules and introduced to the techs on the unit.   R:  Cooperative with the admission process.  Verbalized understanding of all education.  15 minute checks initiated.  Safety is maintained.

## 2013-06-08 NOTE — Progress Notes (Signed)
Voluntary paper work completed with pt and faxed to [facility]

## 2013-06-08 NOTE — Discharge Instructions (Signed)
Alcohol Problems °Most adults who drink alcohol drink in moderation (not a lot) are at low risk for developing problems related to their drinking. However, all drinkers, including low-risk drinkers, should know about the health risks connected with drinking alcohol. °RECOMMENDATIONS FOR LOW-RISK DRINKING  °Drink in moderation. Moderate drinking is defined as follows:  °· Men - no more than 2 drinks per day. °· Nonpregnant women - no more than 1 drink per day. °· Over age 65 - no more than 1 drink per day. °A standard drink is 12 grams of pure alcohol, which is equal to a 12 ounce bottle of beer or wine cooler, a 5 ounce glass of wine, or 1.5 ounces of distilled spirits (such as whiskey, brandy, vodka, or rum).  °ABSTAIN FROM (DO NOT DRINK) ALCOHOL: °· When pregnant or considering pregnancy. °· When taking a medication that interacts with alcohol. °· If you are alcohol dependent. °· A medical condition that prohibits drinking alcohol (such as ulcer, liver disease, or heart disease). °DISCUSS WITH YOUR CAREGIVER: °· If you are at risk for coronary heart disease, discuss the potential benefits and risks of alcohol use: Light to moderate drinking is associated with lower rates of coronary heart disease in certain populations (for example, men over age 45 and postmenopausal women). Infrequent or nondrinkers are advised not to begin light to moderate drinking to reduce the risk of coronary heart disease so as to avoid creating an alcohol-related problem. Similar protective effects can likely be gained through proper diet and exercise. °· Women and the elderly have smaller amounts of body water than men. As a result women and the elderly achieve a higher blood alcohol concentration after drinking the same amount of alcohol. °· Exposing a fetus to alcohol can cause a broad range of birth defects referred to as Fetal Alcohol Syndrome (FAS) or Alcohol-Related Birth Defects (ARBD). Although FAS/ARBD is connected with excessive  alcohol consumption during pregnancy, studies also have reported neurobehavioral problems in infants born to mothers reporting drinking an average of 1 drink per day during pregnancy. °· Heavier drinking (the consumption of more than 4 drinks per occasion by men and more than 3 drinks per occasion by women) impairs learning (cognitive) and psychomotor functions and increases the risk of alcohol-related problems, including accidents and injuries. °CAGE QUESTIONS:  °· Have you ever felt that you should Cut down on your drinking? °· Have people Annoyed you by criticizing your drinking? °· Have you ever felt bad or Guilty about your drinking? °· Have you ever had a drink first thing in the morning to steady your nerves or get rid of a hangover (Eye opener)? °If you answered positively to any of these questions: You may be at risk for alcohol-related problems if alcohol consumption is:  °· Men: Greater than 14 drinks per week or more than 4 drinks per occasion. °· Women: Greater than 7 drinks per week or more than 3 drinks per occasion. °Do you or your family have a medical history of alcohol-related problems, such as: °· Blackouts. °· Sexual dysfunction. °· Depression. °· Trauma. °· Liver dysfunction. °· Sleep disorders. °· Hypertension. °· Chronic abdominal pain. °· Has your drinking ever caused you problems, such as problems with your family, problems with your work (or school) performance, or accidents/injuries? °· Do you have a compulsion to drink or a preoccupation with drinking? °· Do you have poor control or are you unable to stop drinking once you have started? °· Do you have to drink to   avoid withdrawal symptoms? °· Do you have problems with withdrawal such as tremors, nausea, sweats, or mood disturbances? °· Does it take more alcohol than in the past to get you high? °· Do you feel a strong urge to drink? °· Do you change your plans so that you can have a drink? °· Do you ever drink in the morning to relieve  the shakes or a hangover? °If you have answered a number of the previous questions positively, it may be time for you to talk to your caregivers, family, and friends and see if they think you have a problem. Alcoholism is a chemical dependency that keeps getting worse and will eventually destroy your health and relationships. Many alcoholics end up dead, impoverished, or in prison. This is often the end result of all chemical dependency. °· Do not be discouraged if you are not ready to take action immediately. °· Decisions to change behavior often involve up and down desires to change and feeling like you cannot decide. °· Try to think more seriously about your drinking behavior. °· Think of the reasons to quit. °WHERE TO GO FOR ADDITIONAL INFORMATION  °· The National Institute on Alcohol Abuse and Alcoholism (NIAAA) °www.niaaa.nih.gov °· National Council on Alcoholism and Drug Dependence (NCADD) °www.ncadd.org °· American Society of Addiction Medicine (ASAM) °www.asam.org  °Document Released: 03/14/2005 Document Revised: 06/06/2011 Document Reviewed: 10/31/2007 °ExitCare® Patient Information ©2014 [employer]. ° °

## 2013-06-08 NOTE — ED Provider Notes (Signed)
Medical screening examination/treatment/procedure(s) were performed by non-physician practitioner and as supervising physician I was immediately available for consultation/collaboration.   EKG Interpretation None       Kalman Drape, MD 06/08/13 [phone]

## 2013-06-08 NOTE — ED Notes (Signed)
Pt has a significant history of mental issues,  Police was called to pt's sisters home tonight,  Pt "not acting like himself and none compliant with medications for his mental health".  Pt tends to want to touch staffing and goes from crying to throwing things

## 2013-06-08 NOTE — Tx Team (Signed)
Initial Interdisciplinary Treatment Plan  PATIENT STRENGTHS: (choose at least two) Ability for insight Capable of independent living Communication skills General fund of knowledge Motivation for treatment/growth Physical Health Religious Affiliation  PATIENT STRESSORS: Financial difficulties Substance abuse   PROBLEM LIST: Problem List/Patient Goals Date to be addressed Date deferred Reason deferred Estimated date of resolution  I want to have better control of my anxiety 08 Jun 2013     Stabilization of medications 08 Jun 2013                                                DISCHARGE CRITERIA:  Ability to meet basic life and health needs Adequate post-discharge living arrangements Improved stabilization in mood, thinking, and/or behavior  PRELIMINARY DISCHARGE PLAN: Attend aftercare/continuing care group Return to previous living arrangement  PATIENT/FAMIILY INVOLVEMENT: This treatment plan has been presented to and reviewed with the patient, Keith Murray, and/or family member.  The patient and family have been given the opportunity to ask questions and make suggestions.  Salvatore Marvel Mae 06/08/2013, 5:42 PM

## 2013-06-08 NOTE — ED Notes (Signed)
Pt belongings:    4 earrings with clear stones 1 gray colored necklace 1 pair of blue jeans 1 black belt 1 pair of black nike air tennis shoes 1 black t-shirt 1 white t-shirt 1 pair of green underwear 1 pair of green socks

## 2013-06-09 ENCOUNTER — Encounter (HOSPITAL_COMMUNITY): Payer: Self-pay | Admitting: Psychiatry

## 2013-06-09 MED ORDER — ADULT MULTIVITAMIN W/MINERALS CH
1.0000 | ORAL_TABLET | Freq: Every day | ORAL | Status: DC
Start: 2013-06-09 — End: 2013-06-14
  Administered 2013-06-09 – 2013-06-14 (×6): 1 via ORAL
  Filled 2013-06-09 (×4): qty 1
  Filled 2013-06-09: qty 3
  Filled 2013-06-09 (×4): qty 1

## 2013-06-09 MED ORDER — HYDROXYZINE HCL 25 MG PO TABS: 25.0000 mg | ORAL_TABLET | Freq: Four times a day (QID) | ORAL | Status: AC | PRN

## 2013-06-09 MED ORDER — LOPERAMIDE HCL 2 MG PO CAPS: 2.0000 mg | ORAL_CAPSULE | ORAL | Status: AC | PRN

## 2013-06-09 MED ORDER — IBUPROFEN 200 MG PO TABS
400.0000 mg | ORAL_TABLET | Freq: Four times a day (QID) | ORAL | Status: DC | PRN
  Administered 2013-06-09 – 2013-06-12 (×3): 400 mg via ORAL
  Filled 2013-06-09 (×3): qty 2

## 2013-06-09 MED ORDER — CHLORDIAZEPOXIDE HCL 25 MG PO CAPS
25.0000 mg | ORAL_CAPSULE | Freq: Four times a day (QID) | ORAL | Status: AC
  Administered 2013-06-09 (×3): 25 mg via ORAL
  Filled 2013-06-09 (×3): qty 1

## 2013-06-09 MED ORDER — CHLORDIAZEPOXIDE HCL 25 MG PO CAPS
25.0000 mg | ORAL_CAPSULE | Freq: Three times a day (TID) | ORAL | Status: AC
  Administered 2013-06-10 (×3): 25 mg via ORAL
  Filled 2013-06-09 (×3): qty 1

## 2013-06-09 MED ORDER — FLUOXETINE HCL 20 MG PO CAPS
20.0000 mg | ORAL_CAPSULE | Freq: Every day | ORAL | Status: DC
  Administered 2013-06-09 – 2013-06-14 (×6): 20 mg via ORAL
  Filled 2013-06-09 (×2): qty 1
  Filled 2013-06-09: qty 3
  Filled 2013-06-09 (×6): qty 1

## 2013-06-09 MED ORDER — CHLORDIAZEPOXIDE HCL 25 MG PO CAPS
25.0000 mg | ORAL_CAPSULE | Freq: Four times a day (QID) | ORAL | Status: AC | PRN
  Filled 2013-06-09: qty 1

## 2013-06-09 MED ORDER — ONDANSETRON 4 MG PO TBDP: 4.0000 mg | ORAL_TABLET | Freq: Four times a day (QID) | ORAL | Status: AC | PRN

## 2013-06-09 MED ORDER — CHLORDIAZEPOXIDE HCL 25 MG PO CAPS
25.0000 mg | ORAL_CAPSULE | ORAL | Status: AC
  Administered 2013-06-11 (×2): 25 mg via ORAL
  Filled 2013-06-09: qty 1

## 2013-06-09 MED ORDER — THIAMINE HCL 100 MG/ML IJ SOLN: 100.0000 mg | Freq: Once | INTRAMUSCULAR | Status: DC

## 2013-06-09 MED ORDER — VITAMIN B-1 100 MG PO TABS
100.0000 mg | ORAL_TABLET | Freq: Every day | ORAL | Status: DC
  Administered 2013-06-10 – 2013-06-14 (×5): 100 mg via ORAL
  Filled 2013-06-09 (×7): qty 1

## 2013-06-09 MED ORDER — CHLORDIAZEPOXIDE HCL 25 MG PO CAPS
25.0000 mg | ORAL_CAPSULE | Freq: Every day | ORAL | Status: AC
  Administered 2013-06-12: 25 mg via ORAL
  Filled 2013-06-09: qty 1

## 2013-06-09 NOTE — Progress Notes (Signed)
Patient ID: Keith Murray, male   DOB: Nov 24, 1960, 53 y.o.   MRN: [id number] D)  Was admitted earlier this evening and has been pleasant, cooperative, beginning to integrate into the milieu.  Has voiced no c/o's this evening, interacting appropriate;y with staff and peers.  Came to the dayroom for a snack and to the med window for hs med before going to bed. A)  Will continue to monitor for safety, continue POC R)  Safety maintained   a

## 2013-06-09 NOTE — Progress Notes (Signed)
D. Pt has been up and has been active in milieu today, attending and participating in various activities. Pt spoke about how he is feeling better but has endorsed feelings of depression. Pt also spoke some about how he was drinking before coming in and does have visible tremors and does endorse some anxiety. Pt mentioned how he wasn't taking his medications but spoke about how he's gonna keep taking his medications when he leaves here. Pt has denied any SI today. A. Support and encouragement provided, medication education given. R. Pt verbalized understanding, safety maintained.

## 2013-06-09 NOTE — BHH Group Notes (Signed)
[facility] Group Notes:  (Clinical Social Work)  06/09/2013   11:15am-12:00pm  Summary of Progress/Problems:  The main focus of today's process group was to listen to a variety of genres of music and to identify that different types of music provoke different responses.  The patient then was able to identify personally what was soothing for them, as well as energizing.  Handouts were used to record feelings evoked, as well as how patient can personally use this knowledge in sleep habits, with depression, and with other symptoms.  The patient expressed understanding of concepts, as well as knowledge of how each type of music affected him and how this can be used at home as a wellness/recovery tool.  He enjoyed almost every type of music, and smiled a great deal during group.  Type of Therapy:  Music Therapy   Participation Level:  Active  Participation Quality:  Attentive and Sharing  Affect:  Excited  Cognitive:  Oriented  Insight:  Engaged  Engagement in Therapy:  Engaged  Modes of Intervention:   Activity, Exploration  Selmer Dominion, LCSW 06/09/2013, 12:30pm

## 2013-06-09 NOTE — Progress Notes (Signed)
[place] Group Notes: (Nursing/MHT/Case Management/Adjunct)  Date: 06/09/2013   Time: 9:00 AM  Type of Therapy: Psychoeducational Skills   Participation Level: Active   Participation Quality: Appropriate   Affect: Appropriate   Cognitive: Alert and Appropriate   Insight: Lacking  Engagement in Group: Engaged  Modes of Intervention: Discussion, Education and Exploration   Summary of Progress/Problems: Self inventory review with RN.

## 2013-06-09 NOTE — Progress Notes (Signed)
[place] Group Notes: (Nursing/MHT/Case Management/Adjunct)  Date: 06/09/2013   Time: 0920   Type of Therapy: Psychoeducational Skills   Participation Level: Active   Participation Quality: Appropriate   Affect: Appropriate   Cognitive: Alert and Appropriate   Insight: Limited   Engagement in Group: Engaged   Modes of Intervention: Discussion, Education and Exploration   Summary of Progress/Problems: Healthy support systems with RN. Focus on unhealthy supports/triggers and to replace with healthy support systems.

## 2013-06-09 NOTE — BHH Suicide Risk Assessment (Signed)
   Nursing information obtained from:  Patient Demographic factors:  Male;Low socioeconomic status Current Mental Status:  NA Loss Factors:  NA Historical Factors:  NA Risk Reduction Factors:  Responsible for children under 53 years of age;Sense of responsibility to family;Religious beliefs about death;Living with another person, especially a relative;Positive social support;Positive therapeutic relationship;Positive coping skills or problem solving skills Total Time spent with patient: 30 minutes  CLINICAL FACTORS:   Severe Anxiety and/or Agitation Bipolar Disorder:   Depressive phase Depression:   Anhedonia Comorbid alcohol abuse/dependence Hopelessness Impulsivity Insomnia Alcohol/Substance Abuse/Dependencies Unstable or Poor Therapeutic Relationship  Psychiatric Specialty Exam: Physical Exam  Psychiatric: His behavior is normal. Thought content normal. His mood appears anxious. His speech is slurred. Cognition and memory are normal. He expresses impulsivity. He exhibits a depressed mood.    Review of Systems  Constitutional: Positive for malaise/fatigue. Negative for fever and chills.  HENT: Negative.   Eyes: Negative.   Respiratory: Negative.   Cardiovascular: Negative.   Gastrointestinal: Positive for nausea. Negative for heartburn, vomiting, abdominal pain, diarrhea, constipation and blood in stool.  Genitourinary: Negative.   Musculoskeletal: Positive for myalgias. Negative for back pain, falls, joint pain and neck pain.  Skin: Negative.   Neurological: Positive for tremors and weakness.  Endo/Heme/Allergies: Negative.   Psychiatric/Behavioral: Positive for substance abuse. The patient is nervous/anxious and has insomnia.     Blood pressure 113/77, pulse 91, temperature 98.5 F (36.9 C), temperature source Oral, resp. rate 18, height 5\' 11"  (1.803 m), weight 78.472 kg (173 lb).Body mass index is 24.14 kg/(m^2).  General Appearance: Fairly Groomed  Engineer, water::  Good   Speech:  Garbled and Pressured  Volume:  Normal  Mood:  Anxious and Depressed  Affect:  Appropriate  Thought Process:  Disorganized  Orientation: only oriented to place, person and not to time  Thought Content:  Negative  Suicidal Thoughts:  No  Homicidal Thoughts:  No  Memory:  Immediate;   Fair Recent;   Fair Remote;   Fair  Judgement:  Poor  Insight:  Lacking  Psychomotor Activity:  Increased  Concentration:  Fair  Recall:  [employer] of Knowledge:Fair  Language: Good  Akathisia:  No  Handed:  Right  AIMS (if indicated):     Assets:  Communication Skills Desire for Improvement Physical Health Social Support  Sleep:  Number of Hours: 6.25   Musculoskeletal: Strength & Muscle Tone: within normal limits Gait & Station: normal Patient leans: N/A  COGNITIVE FEATURES THAT CONTRIBUTE TO RISK:  Closed-mindedness Polarized thinking    SUICIDE RISK:   Minimal: No identifiable suicidal ideation.  Patients presenting with no risk factors but with morbid ruminations; may be classified as minimal risk based on the severity of the depressive symptoms  PLAN OF CARE:1. Admit for crisis management and stabilization. 2. Medication management to reduce current symptoms to base line and improve the     patient's overall level of functioning 3. Treat health problems as indicated. 4. Develop treatment plan to decrease risk of relapse upon discharge and the need for     readmission. 5. Psycho-social education regarding relapse prevention and self care. 6. Health care follow up as needed for medical problems. 7. Restart home medications where appropriate.   I certify that inpatient services furnished can reasonably be expected to improve the patient's condition.  Gerardine Peltz,MD 06/09/2013, 10:54 AM

## 2013-06-09 NOTE — Progress Notes (Signed)
[name] Notes:  (Nursing/MHT/Case Management/Adjunct)  Date:  06/09/2013  Time:  8:00 p.m.   Type of Therapy:  Psychoeducational Skills  Participation Level:  Active  Participation Quality:  Appropriate  Affect:  Appropriate  Cognitive:  Appropriate  Insight:  Appropriate  Engagement in Group:  Engaged  Modes of Intervention:  Education  Summary of Progress/Problems: The patient shared with the group that he had a great day for a few reasons. First of all, he mentioned that he went outside for fresh air. Secondly, he mentioned that he enjoyed the groups. Finally, he indicated that he has begun to feel better now that he is taking his medication. As a theme for the day, his relapse prevention will include calling his doctor when he isn't feeling well.   Keith Murray S 06/09/2013, 10:22 PM

## 2013-06-09 NOTE — H&P (Signed)
Psychiatric Admission Assessment Adult  Patient Identification:  Keith Murray Date of Evaluation:  06/09/2013 Chief Complaint: " I stopped taking my medication and was drinking alcohol and smoking crack cocaine.'' History of Present Illness::Patient is a 53 year old man with history of Schizoaffective disorder and polysubstance abuse who was brought to ED by the police Dept activated by his family. Per the chart, his family called the police  after he was found acting bizarre and intoxicated with alcohol, his alcohol level at ED was 321. Patient reports that he was not taking his medication after he was discharged from hospital few months ago, also has not kept up with his after care at [facility]. Instead he has been self medicating with crack cocaine and Alcohol. Patient denies delusion or psychotic symptoms but  reports vague depressive symptoms, characterized by anhedonia, lack of motivation, feeling hopeless, poor concentration and difficulty sleeping. His thought process is disorganized. He denies suicidal or homicidal ideations. He is requesting for alcohol detoxification and  to get back on his medications. He is showing sign of alcohol withdrawal characterized by nausea, diaphoresis, headache, anxiety, nervousness and bilateral hand tremors. Patient has been struggling with alcohol for over 30 years.  Elements:  Location:  depression, Alcohol dependence, Alcohol withdrawal. Quality:  recurrent alcohol abuse. Severity:  severe. Timing:  drinks alcohol daily . Duration:  for the past 30 years. Context:  non complaint with medication and after care. Associated Signs/Synptoms: Depression Symptoms:  depressed mood, anhedonia, psychomotor retardation, feelings of worthlessness/guilt, difficulty concentrating, hopelessness, impaired memory, anxiety, loss of energy/fatigue, disturbed sleep, (Hypo) Manic Symptoms:  Impulsivity, Anxiety Symptoms:  Excessive Worry, Psychotic Symptoms:   PTSD  Symptoms: NA Total Time spent with patient: 30 minutes  Psychiatric Specialty Exam: Physical Exam  Psychiatric: His behavior is normal. Thought content normal. His mood appears anxious. His speech is rapid and/or pressured and slurred. Cognition and memory are normal. He expresses impulsivity. He exhibits a depressed mood.    Review of Systems  Constitutional: Positive for malaise/fatigue. Negative for fever and chills.  HENT: Negative.   Eyes: Negative.   Respiratory: Negative.   Cardiovascular: Negative.   Gastrointestinal: Positive for nausea. Negative for heartburn, vomiting, abdominal pain, diarrhea, constipation, blood in stool and melena.  Genitourinary: Negative.   Musculoskeletal: Positive for myalgias.  Skin: Negative.   Neurological: Positive for tremors and weakness. Negative for dizziness, tingling, sensory change, speech change and focal weakness.  Endo/Heme/Allergies: Negative.   Psychiatric/Behavioral: Positive for depression and substance abuse. The patient is nervous/anxious and has insomnia.     Blood pressure 113/77, pulse 91, temperature 98.5 F (36.9 C), temperature source Oral, resp. rate 18, height 5\' 11"  (1.803 m), weight 78.472 kg (173 lb).Body mass index is 24.14 kg/(m^2).  General Appearance: Fairly Groomed  Engineer, water::  Good  Speech:  Garbled and Pressured  Volume:  Normal  Mood:  Anxious and Depressed  Affect:  Restricted  Thought Process:  Disorganized  Orientation:  Only to Place, and Person not to time  Thought Content:  Negative  Suicidal Thoughts:  No  Homicidal Thoughts:  No  Memory:  Immediate;   Fair Recent;   Fair Remote;   Fair  Judgement:  Poor  Insight:  Lacking  Psychomotor Activity:  Increased  Concentration:  Fair  Recall:  [employer] of Knowledge:Fair  Language: Good  Akathisia:  No  Handed:  Right  AIMS (if indicated):     Assets:  Communication Skills Desire for Improvement Physical Health Social  Support  Sleep:   Number of Hours: 6.25    Musculoskeletal: Strength & Muscle Tone: within normal limits Gait & Station: normal Patient leans: N/A  Past Psychiatric History: Diagnosis: Schizoaffective disorder, Alcohol dependence, Cocaine abuse  Hospitalizations: [facility]  Outpatient Care: [facility]  Substance Abuse Care:  hospital  Self-Mutilation: denies  Suicidal Attempts: denies  Violent Behaviors: denies   Past Medical History:   Past Medical History  Diagnosis Date  . None reported    None. Allergies:  No Known Allergies PTA Medications: Prescriptions prior to admission  Medication Sig Dispense Refill  . benztropine (COGENTIN) 0.5 MG tablet Take 1 tablet (0.5 mg total) by mouth 2 (two) times daily. For side effects.  60 tablet  0  . haloperidol (HALDOL) 5 MG tablet Take 1 tablet (5 mg total) by mouth 2 (two) times daily. For mental clarity and psychosis.  60 tablet  0  . traZODone (DESYREL) 100 MG tablet Take 1 tablet (100 mg total) by mouth at bedtime. For insomnia  30 tablet  0    Previous Psychotropic Medications:  Medication/Dose: Haldol, Trazodone, Cogentin                 Substance Abuse History in the last 12 months:  yes  Consequences of Substance Abuse: Withdrawal Symptoms:   Diaphoresis Headaches Nausea Tremors  Social History:  reports that he has been smoking.  He does not have any smokeless tobacco history on file. He reports that he drinks alcohol. He reports that he uses illicit drugs (Cocaine and Marijuana). Additional Social History: History of alcohol / drug use?: Yes Longest period of sobriety (when/how long): 1 year Withdrawal Symptoms: Other (Comment) (None at this time. )                    Current Place of Residence:   Place of Birth:   Family Members: Marital Status:  Single Children:  Sons:  Daughters: Relationships: Education:  [employer] Problems/Performance: Religious  Beliefs/Practices: History of Abuse (Emotional/Phsycial/Sexual) Occupational Experiences; Military History:  None. Legal History: Hobbies/Interests:  Family History:  History reviewed. No pertinent family history.  Results for orders placed during the hospital encounter of 06/08/13 (from the past 72 hour(s))  CBC     Status: None   Collection Time    06/08/13  4:12 AM      Result Value Ref Range   WBC 5.7  4.0 - 10.5 K/uL   RBC 5.11  4.22 - 5.81 MIL/uL   Hemoglobin 16.1  13.0 - 17.0 g/dL   HCT 45.7  39.0 - 52.0 %   MCV 89.4  78.0 - 100.0 fL   MCH 31.5  26.0 - 34.0 pg   MCHC 35.2  30.0 - 36.0 g/dL   RDW 14.0  11.5 - 15.5 %   Platelets 226  150 - 400 K/uL  COMPREHENSIVE METABOLIC PANEL     Status: Abnormal   Collection Time    06/08/13  4:12 AM      Result Value Ref Range   Sodium 143  137 - 147 mEq/L   Potassium 4.5  3.7 - 5.3 mEq/L   Chloride 105  96 - 112 mEq/L   CO2 20  19 - 32 mEq/L   Glucose, Bld 99  70 - 99 mg/dL   BUN 7  6 - 23 mg/dL   Creatinine, Ser 0.86  0.50 - 1.35 mg/dL   Calcium 9.1  8.4 - 10.5 mg/dL  Total Protein 8.4 (*) 6.0 - 8.3 g/dL   Albumin 4.5  3.5 - 5.2 g/dL   AST 17  0 - 37 U/L   Comment: SLIGHT HEMOLYSIS   ALT 10  0 - 53 U/L   Alkaline Phosphatase 100  39 - 117 U/L   Total Bilirubin 0.3  0.3 - 1.2 mg/dL   GFR calc non Af Amer >90  >90 mL/min   GFR calc Af Amer >90  >90 mL/min   Comment: (NOTE)     The eGFR has been calculated using the CKD EPI equation.     This calculation has not been validated in all clinical situations.     eGFR's persistently <90 mL/min signify possible Chronic Kidney     Disease.  ETHANOL     Status: Abnormal   Collection Time    06/08/13  4:12 AM      Result Value Ref Range   Alcohol, Ethyl (B) 321 (*) 0 - 11 mg/dL   Comment:            LOWEST DETECTABLE LIMIT FOR     SERUM ALCOHOL IS 11 mg/dL     FOR MEDICAL PURPOSES ONLY  URINE RAPID DRUG SCREEN (HOSP PERFORMED)     Status: Abnormal   Collection Time     06/08/13  4:29 AM      Result Value Ref Range   Opiates NONE DETECTED  NONE DETECTED   Cocaine POSITIVE (*) NONE DETECTED   Benzodiazepines NONE DETECTED  NONE DETECTED   Amphetamines NONE DETECTED  NONE DETECTED   Tetrahydrocannabinol NONE DETECTED  NONE DETECTED   Barbiturates NONE DETECTED  NONE DETECTED   Comment:            DRUG SCREEN FOR MEDICAL PURPOSES     ONLY.  IF CONFIRMATION IS NEEDED     FOR ANY PURPOSE, NOTIFY LAB     WITHIN 5 DAYS.                LOWEST DETECTABLE LIMITS     FOR URINE DRUG SCREEN     Drug Class       Cutoff (ng/mL)     Amphetamine      1000     Barbiturate      200     Benzodiazepine   161     Tricyclics       096     Opiates          300     Cocaine          300     THC              50   Psychological Evaluations:  Assessment:   DSM5:  Schizophrenia Disorders:  History of psychosis/delusions Obsessive-Compulsive Disorders:   Trauma-Stressor Disorders:   Substance/Addictive Disorders:  Alcohol Related Disorder - Severe (303.90), Cocaine Depressive Disorders:  Major Depressive Disorder (296.99)  AXIS I:  Schizoaffective Disorder              Alcohol dependence              Cocaine use disorder AXIS II:  Deferred AXIS III:   Past Medical History  Diagnosis Date  . None reported    AXIS IV:  other psychosocial or environmental problems and problems related to social environment AXIS V:  21-30 behavior considerably influenced by delusions or hallucinations OR serious impairment in judgment, communication OR inability to function in almost all areas  Treatment Plan/Recommendations:  *1. Admit for crisis management and stabilization. 2. Medication management to reduce current symptoms to base line and improve the  patient's overall level of functioning: -Initiate Librium detox protocol for alcohol withdrawal/dependence -Initiate Prozac 33m daily for depression. -Continue Haldol 560mbid for psychosis/delusions -Continue Trazodone 10060mqhs as needed for insomnia - Continue Cogentin 0.5 bid for EPS prevention. 3. Treat health problems as indicated. 4. Develop treatment plan to decrease risk of relapse upon discharge and the need for readmission. 5. Psycho-social education regarding relapse prevention and self care. 6. Health care follow up as needed for medical problems. 7. Restart home medications where appropriate.  Treatment Plan Summary: Daily contact with patient to assess and evaluate symptoms and progress in treatment Medication management Current Medications:  Current Facility-Administered Medications  Medication Dose Route Frequency Provider Last Rate Last Dose  . benztropine (COGENTIN) tablet 0.5 mg  0.5 mg Oral BID JamWaylan BogaP   0.5 mg at 06/09/13 0811  . chlordiazePOXIDE (LIBRIUM) capsule 25 mg  25 mg Oral Q6H PRN Finnlee Guarnieri      . chlordiazePOXIDE (LIBRIUM) capsule 25 mg  25 mg Oral QID Maryruth Apple       Followed by  . [START ON 06/10/2013] chlordiazePOXIDE (LIBRIUM) capsule 25 mg  25 mg Oral TID Aime Carreras       Followed by  . [START ON 06/11/2013] chlordiazePOXIDE (LIBRIUM) capsule 25 mg  25 mg Oral BH-qamhs Jahniya Duzan       Followed by  . [START ON 06/13/2013] chlordiazePOXIDE (LIBRIUM) capsule 25 mg  25 mg Oral Daily Kaelen Caughlin      . haloperidol (HALDOL) tablet 5 mg  5 mg Oral BID JamWaylan BogaP   5 mg at 06/09/13 0811  . hydrOXYzine (ATARAX/VISTARIL) tablet 25 mg  25 mg Oral Q6H PRN Daylyn Azbill      . ibuprofen (ADVIL,MOTRIN) tablet 400 mg  400 mg Oral Q6H PRN Beyounce Dickens      . lisinopril (PRINIVIL,ZESTRIL) tablet 10 mg  10 mg Oral Daily JamWaylan BogaP   10 mg at 06/09/13 0811  . loperamide (IMODIUM) capsule 2-4 mg  2-4 mg Oral PRN Joseandres Mazer      . magnesium hydroxide (MILK OF MAGNESIA) suspension 30 mL  30 mL Oral Daily PRN JamWaylan BogaP      . multivitamin with minerals tablet 1 tablet  1 tablet Oral Daily Nikholas Geffre      . ondansetron  (ZOFRAN-ODT) disintegrating tablet 4 mg  4 mg Oral Q6H PRN Nattaly Yebra      . [START ON 06/10/2013] thiamine (VITAMIN B-1) tablet 100 mg  100 mg Oral Daily Apryle Stowell      . traZODone (DESYREL) tablet 100 mg  100 mg Oral QHS JamWaylan BogaP   100 mg at 06/08/13 2147    Observation Level/Precautions:  routine  Laboratory:  routine  Psychotherapy:    Medications:    Consultations:    Discharge Concerns:    Estimated LOS: 5-7 days  Other:     I certify that inpatient services furnished can reasonably be expected to improve the patient's condition.   AkiCorena PilgrimD 3/15/201511:04 AM

## 2013-06-10 NOTE — Progress Notes (Signed)
[facility] Group Notes:  (Nursing/MHT/Case Management/Adjunct)  Date:  06/10/2013  Time:  8:00 p.m.   Type of Therapy:  Psychoeducational Skills  Participation Level:  Minimal  Participation Quality:  Attentive  Affect:  Blunted  Cognitive:  Appropriate  Insight:  Improving  Engagement in Group:  Lacking  Modes of Intervention:  Education  Summary of Progress/Problems: The patient shared in group that he had a good day since he played basketball and spent time socializing with his peers. In terms of the theme of the day, his support system will consist of his sister.   Shemiah Rosch S 06/10/2013, 10:50 PM

## 2013-06-10 NOTE — Progress Notes (Signed)
Patient ID: Keith Murray, male   DOB: 23-Jul-1960, 53 y.o.   MRN: [id number] D: patient presents with calm, pleasant affect.  He is cooperative and compliant with his medications.  He reports some withdrawal symptoms such as tremors, cravings and general aches.  He reports that the auditory hallucinations have decreased.  He denies SI today.  He denies HI.  He denies any depressive symptoms reporting his depression 1/10.  Patient wrote his self inventory he plans to "keep taking medications."  A: continue to monitor medication management and MD orders.  Safety checks completed every 15 minutes per protocol.  R: patient is receptive; his behavior is appropriate.

## 2013-06-10 NOTE — [facility] Group Notes (Signed)
[facility] LCSW Aftercare Discharge Planning Group Note   06/10/2013 8:25 AM  Participation Quality:  Engaged  Mood/Affect:  Appropriate  Depression Rating:  denies  Anxiety Rating:  denies  Thoughts of Suicide:  No Will you contract for safety?   NA  Current AVH:  No  Plan for Discharge/Comments:  Keith Murray is not sure why he is here, but does admit to using alcohol prior to admission.  "I had a couple of beers."  Denies cocaine use.  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  Gave me permission to talk to niece Keith Murray.    Transportation Means: family  Supports: family  Keith Murray, Keith Murray

## 2013-06-10 NOTE — Progress Notes (Signed)
[facility] MD Progress Note  06/10/2013 10:52 AM Keith Murray  MRN:  [id number] Subjective: " my hands have been shaking a lot and I am feeling anxious.'' Objective: Patient seen and chart is reviewed. Patient reports ongoing cravings for alcohol and cocaine. He is endorsing nervousness, headache, anxiety, restlessness and poor concentration. However, he reports decreased depressive symptoms. Patient denies suicidal/homicidal ideations, delusion and psychotic symptoms. He has been attending all the unit milieu and compliant with his medications. Diagnosis:   DSM5: Schizophrenia Disorders:  History of psychosis Obsessive-Compulsive Disorders:   Trauma-Stressor Disorders:   Substance/Addictive Disorders:  Alcohol Related Disorder - Severe (303.90), Cocaine Depressive Disorders:  Major Depressive Disorder (296.99) Total Time spent with patient: 20 minutes  Axis I: Schizoaffective disorder           Alcohol dependence           Cocaine use disorder Axis II: Deferred Axis III:  Past Medical History  Diagnosis Date  . None reported    Axis IV: other psychosocial or environmental problems and problems related to social environment  ADL's:  Intact  Sleep: Fair  Appetite:  Fair  Suicidal Ideation: denies  Homicidal Ideation: denies  AEB (as evidenced by):  Psychiatric Specialty Exam: Physical Exam  Psychiatric: Thought content normal. His mood appears anxious. His affect is not angry, not blunt and not labile. His speech is rapid and/or pressured. He is slowed and withdrawn. Cognition and memory are normal. He expresses impulsivity. He exhibits a depressed mood.    Review of Systems  Constitutional: Negative.   Eyes: Negative.   Respiratory: Negative.   Gastrointestinal: Negative.   Genitourinary: Negative.   Musculoskeletal: Positive for myalgias. Negative for back pain, falls, joint pain and neck pain.  Skin: Negative.   Neurological: Positive for tremors. Negative for dizziness,  tingling, speech change, focal weakness and seizures.  Endo/Heme/Allergies: Negative.   Psychiatric/Behavioral: Positive for depression and substance abuse. The patient is nervous/anxious.     Blood pressure 119/79, pulse 97, temperature 97.4 F (36.3 C), temperature source Oral, resp. rate 18, height 5\' 11"  (1.803 m), weight 78.472 kg (173 lb).Body mass index is 24.14 kg/(m^2).  General Appearance: Fairly Groomed  Engineer, water::  Good  Speech:  Clear and Coherent and Normal Rate  Volume:  Normal  Mood:  Anxious and Depressed  Affect:  Restricted  Thought Process:  Circumstantial  Orientation:  Full (Time, Place, and Person)  Thought Content:  Negative  Suicidal Thoughts:  No  Homicidal Thoughts:  No  Memory:  Immediate;   Fair Recent;   Fair Remote;   Fair  Judgement:  Impaired  Insight:  Shallow  Psychomotor Activity:  Decreased  Concentration:  Fair  Recall:  [place]: Good  Akathisia:  No  Handed:  Right  AIMS (if indicated):     Assets:  Communication Skills Desire for Improvement Physical Health  Sleep:  Number of Hours: 6.75   Musculoskeletal: Strength & Muscle Tone: within normal limits Gait & Station: normal Patient leans: N/A  Current Medications: Current Facility-Administered Medications  Medication Dose Route Frequency Provider Last Rate Last Dose  . benztropine (COGENTIN) tablet 0.5 mg  0.5 mg Oral BID Waylan Boga, NP   0.5 mg at 06/10/13 0843  . chlordiazePOXIDE (LIBRIUM) capsule 25 mg  25 mg Oral Q6H PRN Cristan Hout      . chlordiazePOXIDE (LIBRIUM) capsule 25 mg  25 mg Oral TID Lamyah Creed   25 mg at 06/10/13 9937  Followed by  . [START ON 06/11/2013] chlordiazePOXIDE (LIBRIUM) capsule 25 mg  25 mg Oral BH-qamhs Tryce Surratt       Followed by  . [START ON 06/12/2013] chlordiazePOXIDE (LIBRIUM) capsule 25 mg  25 mg Oral Daily Nancy Manuele      . FLUoxetine (PROZAC) capsule 20 mg  20 mg Oral Daily Quitman Norberto   20 mg at 06/10/13 0845  . haloperidol (HALDOL) tablet 5 mg  5 mg Oral BID Waylan Boga, NP   5 mg at 06/10/13 0844  . hydrOXYzine (ATARAX/VISTARIL) tablet 25 mg  25 mg Oral Q6H PRN Aarush Stukey      . ibuprofen (ADVIL,MOTRIN) tablet 400 mg  400 mg Oral Q6H PRN Spencer Peterkin   400 mg at 06/09/13 1258  . lisinopril (PRINIVIL,ZESTRIL) tablet 10 mg  10 mg Oral Daily Waylan Boga, NP   10 mg at 06/10/13 0843  . loperamide (IMODIUM) capsule 2-4 mg  2-4 mg Oral PRN Prestin Munch      . magnesium hydroxide (MILK OF MAGNESIA) suspension 30 mL  30 mL Oral Daily PRN Waylan Boga, NP      . multivitamin with minerals tablet 1 tablet  1 tablet Oral Daily Mitesh Rosendahl   1 tablet at 06/10/13 0844  . ondansetron (ZOFRAN-ODT) disintegrating tablet 4 mg  4 mg Oral Q6H PRN Chelby Salata      . thiamine (VITAMIN B-1) tablet 100 mg  100 mg Oral Daily Alucard Fearnow   100 mg at 06/10/13 0843  . traZODone (DESYREL) tablet 100 mg  100 mg Oral QHS Waylan Boga, NP   100 mg at 06/09/13 2217    Lab Results: No results found for this or any previous visit (from the past 48 hour(s)).  Physical Findings: AIMS: Facial and Oral Movements Muscles of Facial Expression: None, normal Lips and Perioral Area: None, normal Jaw: None, normal Tongue: None, normal,Extremity Movements Upper (arms, wrists, hands, fingers): None, normal Lower (legs, knees, ankles, toes): None, normal, Trunk Movements Neck, shoulders, hips: None, normal, Overall Severity Severity of abnormal movements (highest score from questions above): None, normal Incapacitation due to abnormal movements: None, normal Patient's awareness of abnormal movements (rate only patient's report): No Awareness, Dental Status Current problems with teeth and/or dentures?: No Does patient usually wear dentures?: No  CIWA:  CIWA-Ar Total: 6 COWS:     Treatment Plan Summary: Daily contact with patient to assess and evaluate symptoms and progress in  treatment Medication management  Plan: Admit for crisis management and stabilization.  2. Medication management to reduce current symptoms to base line and improve the patient's overall level of functioning:  - Continue Librium detox protocol for alcohol withdrawal/dependence  - Continue Prozac 20mg  daily for depression.  -Continue Haldol 5mg  bid for psychosis/delusions  -Continue Trazodone 100mg  qhs as needed for insomnia  - Continue Cogentin 0.5 bid for EPS prevention.  3. Treat health problems as indicated.  4. Develop treatment plan to decrease risk of relapse upon discharge and the need for readmission.  5. Psycho-social education regarding relapse prevention and self care.  6. Health care follow up as needed for medical problems.   Medical Decision Making Problem Points:  Established problem, improving (1), Review of last therapy session (1) and Review of psycho-social stressors (1) Data Points:  Order Aims Assessment (2) Review or order clinical lab tests (1) Review of medication regiment & side effects (2) Review of new medications or change in dosage (2)  I certify that inpatient services  furnished can reasonably be expected to improve the patient's condition.   Corena Pilgrim, MD 06/10/2013, 10:52 AM

## 2013-06-10 NOTE — BHH Group Notes (Signed)
[facility] LCSW Group Therapy  06/10/2013 1:15 pm  Type of Therapy: Process Group Therapy  Participation Level:  Active  Participation Quality:  Appropriate  Affect:  Flat  Cognitive:  Oriented  Insight:  Improving  Engagement in Group:  Limited  Engagement in Therapy:  Limited  Modes of Intervention:  Activity, Clarification, Education, Problem-solving and Support  Summary of Progress/Problems: Today's group addressed the issue of overcoming obstacles.  Patients were asked to identify their biggest obstacle post d/c that stands in the way of their on-going success, and then problem solve as to how to manage this.  "Guess I need to stay away from the corner store.  That's where I ran into my friend, and we bought some beer there."  This only after he had already stated he he has no challenges and that everything is good.  When pushed about his drinking, reluctantly admitted that not drinking is a better option for him "because it seems like I end up in the hospital."  Limited insight.  Roque Lias B 06/10/2013   3:25 PM

## 2013-06-10 NOTE — Progress Notes (Signed)
Patient ID: Keith Murray, male   DOB: 02-Mar-1961, 53 y.o.   MRN: [id number] D)   Was taking a nap at the beginning of the shift, but got up to come to the dayroom for group.Marland Kitchen  Has been pleasant and cooperative, stated was feeling a little anxious and was a little tremulous, but denies most other w/d sx at this time.  Was given hs dose of librium as scheduled, fluids encouraged, had a snack before going to bed.  Is rather quiet and doesn't initiate conversation. A)  Will continue to monitor for safety, support, encouragement, continuePOC R)  Safety maintained.

## 2013-06-10 NOTE — Tx Team (Signed)
  Interdisciplinary Treatment Plan Update   Date Reviewed:  06/10/2013  Time Reviewed:  8:24 AM  Progress in Treatment:   Attending groups: Yes Participating in groups: Yes Taking medication as prescribed: Yes  Tolerating medication: Yes Family/Significant other contact made: No Patient understands diagnosis: No  Limited insight Discussing patient identified problems/goals with staff: Yes  See initial care plan Medical problems stabilized or resolved: Yes Denies suicidal/homicidal ideation: Yes  In tx team Patient has not harmed self or others: Yes  For review of initial/current patient goals, please see plan of care.  Estimated Length of Stay:  4-5 days  Reason for Continuation of Hospitalization: Delusions  Medication stabilization Withdrawal symptoms  New Problems/Goals identified:  N/A  Discharge Plan or Barriers:   return home, follow up outpt  Additional Comments:  53 year old black male admitted from [facility]. Was brought in by police because his family stated he was acting bizarre and had been off of medications for a while, unsure of how long. States he receives medications from [facility]. Patient was intoxicated on admission to ED, but states he only drinks about once every couple of months. Also using cocaine and THC, but again patient states that he does not use daily.   Attendees:  Signature: Corena Pilgrim, MD 06/10/2013 8:24 AM   Signature: Ripley Fraise, LCSW 06/10/2013 8:24 AM  Signature: Elmarie Shiley, NP 06/10/2013 8:24 AM  Signature: Mayra Neer, RN 06/10/2013 8:24 AM  Signature: Darrol Angel, RN 06/10/2013 8:24 AM  Signature:  06/10/2013 8:24 AM  Signature:   06/10/2013 8:24 AM  Signature:    Signature:    Signature:    Signature:    Signature:    Signature:      Scribe for Treatment Team:   Ripley Fraise, LCSW  06/10/2013 8:24 AM

## 2013-06-11 DIAGNOSIS — F1410 Cocaine abuse, uncomplicated: Secondary | ICD-10-CM

## 2013-06-11 DIAGNOSIS — F259 Schizoaffective disorder, unspecified: Principal | ICD-10-CM

## 2013-06-11 DIAGNOSIS — F1020 Alcohol dependence, uncomplicated: Secondary | ICD-10-CM

## 2013-06-11 NOTE — BHH Counselor (Signed)
Adult Psychosocial Assessment Update Interdisciplinary Team  Previous [facility] admissions/discharges:  Admissions Discharges  Date:10/07/12 Date:  Date: 08/13/12 Date:  Date: 04/22/10 Date:  Date: Date:  Date: Date:   Changes since the last Psychosocial Assessment (including adherence to outpatient mental health and/or substance abuse treatment, situational issues contributing to decompensation and/or relapse). Thanks to supportive nieces, [place] is again with [place] as he stopped his medications and was drinking and using cocaine, a pattern that is on-going.  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             Discharge Plan 1. Will you be returning to the same living situation after discharge?   Yes:X  home No:      If no, what is your plan?           2. Would you like a referral for services when you are discharged? Yes:     If yes, for what services?  No:       Will talk to pt about ACT team referral.       Summary and Recommendations (to be completed by the evaluator) Phelan is a likeable 53 YO AA male who appears older than his chronological age.  His combination of significant and persistent mental illness coupled with periodic substance abuse renders him incapable of self care and safety at times.  His neices, who found him housing and check on him, are his supports.  He can benefit from crises stabilization, medication management, therapeutic milieu and referral for services.                       Signature:  Trish Mage, 06/11/2013 8:19 AM

## 2013-06-11 NOTE — Progress Notes (Signed)
D: Patient denies SI/HI and auditory and visual hallucinations. The patient has an anxious mood and affect. The patient is reporting minimal detox symptoms at this time. Patient is positive for some agitation and slight tremors. The patient is attending groups and interacting appropriately within the milieu.  A: Patient given emotional support from RN. Patient encouraged to come to staff with concerns and/or questions. Patient's medication routine continued. Patient's orders and plan of care reviewed.  R: Patient remains appropriate and cooperative. Will continue to monitor patient q15 minutes for safety.

## 2013-06-11 NOTE — Progress Notes (Signed)
Adult Psychoeducational Group Note  Date:  06/11/2013 Time:  10:28 PM  Group Topic/Focus:  Wrap-Up Group:   The focus of this group is to help patients review their daily goal of treatment and discuss progress on daily workbooks.  Participation Level:  Active  Participation Quality:  Appropriate  Affect:  Appropriate  Cognitive:  Appropriate  Insight: Appropriate  Engagement in Group:  Engaged  Modes of Intervention:  Discussion  Additional Comments:  Patient says that he had a great day and he had a chance to go outside and that was nice. Patient says that he may go home on Friday and he was happy about that.  Keith Murray 06/11/2013, 10:28 PM

## 2013-06-11 NOTE — Progress Notes (Signed)
Patient ID: Keith Murray, male   DOB: 1961-02-13, 53 y.o.   MRN: [id number]  D: After an introduction pt informed the writer that he's scheduled for possible discharge on Friday. Stated that "his family is gonna take care of him". Pt had no questions or concerns at this time  A:  Support and encouragement was offered. 15 min checks continued for safety.  R: Pt remains safe.

## 2013-06-11 NOTE — BHH Group Notes (Signed)
[place] Group Notes:  (Nursing/MHT/Case Management/Adjunct)  Date:  06/11/2013  Time:  0900 am  Type of Therapy:  Psychoeducational Skills  Participation Level:  Minimal  Participation Quality:  Attentive  Affect:  Appropriate  Cognitive:  Alert  Insight:  Lacking  Engagement in Group:  Limited  Modes of Intervention:  Support  Summary of Progress/Problems:  Keith Murray 06/11/2013, 1:26 PM

## 2013-06-11 NOTE — BHH Group Notes (Signed)
[place] LCSW Group Therapy  06/11/2013 , 2:58 PM   Type of Therapy:  Group Therapy  Participation Level:  Active  Participation Quality:  Attentive  Affect:  Appropriate  Cognitive:  Alert  Insight:  Improving  Engagement in Therapy:  Engaged  Modes of Intervention:  Discussion, Exploration and Socialization  Summary of Progress/Problems: Today's group focused on the term Diagnosis.  Participants were asked to define the term, and then pronounce whether it is a negative, positive or neutral term.  Keith Murray is really not able to benefit from the group setting.  He his difficulty with abstract thinking, and gets antsy and gets up, walks around, leaves and returns.  That is what he did in this group.  Keith Murray 06/11/2013 , 2:58 PM

## 2013-06-11 NOTE — Progress Notes (Signed)
Patient ID: Keith Murray, male   DOB: 1960-07-29, 53 y.o.   MRN: [id number] [facility] MD Progress Note  06/11/2013 1:14 PM Keith Murray  MRN:  [id number] Subjective: Patient states "I was drinking. I want to stop drinking beer. It has been getting me into trouble. I don't know how that cocaine got in my system.  Objective: Patient seen and chart is reviewed. Patient reports ongoing cravings for alcohol. Patient denying use of cocaine although his urine drugs screen is positive. He appears anxious and restless. Patient is reporting decreasing withdrawal symptoms of anxiety, agitation, and tremors. During conversation the patient is guarded with Probation officer and appears to minimize his psychiatric symptoms. It appears that the patient does not feel comfortable talking about them. Patient admits to being off medications prior to admission due to not keeping his follow up at [facility]. The patient has a long history of problems with alcohol abuse over the last thirty years. Keith Murray is attending groups and is compliant with his scheduled medications.   Diagnosis:   DSM5: Schizophrenia Disorders:  History of psychosis Obsessive-Compulsive Disorders:   Trauma-Stressor Disorders:   Substance/Addictive Disorders:  Alcohol Related Disorder - Severe (303.90), Cocaine Depressive Disorders:  Major Depressive Disorder (296.99) Total Time spent with patient: 20 minutes  Axis I: Schizoaffective disorder           Alcohol dependence           Cocaine use disorder Axis II: Deferred Axis III:  Past Medical History  Diagnosis Date  . None reported    Axis IV: other psychosocial or environmental problems and problems related to social environment  ADL's:  Intact  Sleep: Fair  Appetite:  Fair  Suicidal Ideation: denies  Homicidal Ideation: denies  AEB (as evidenced by):  Psychiatric Specialty Exam: Physical Exam  Psychiatric: Thought content normal. His mood appears anxious. His affect is not angry, not blunt  and not labile. His speech is rapid and/or pressured. He is slowed and withdrawn. Cognition and memory are normal. He expresses impulsivity. He exhibits a depressed mood.    Review of Systems  Constitutional: Negative.   Eyes: Negative.   Respiratory: Negative.   Gastrointestinal: Negative.   Genitourinary: Negative.   Musculoskeletal: Positive for myalgias. Negative for back pain, falls, joint pain and neck pain.  Skin: Negative.   Neurological: Positive for tremors. Negative for dizziness, tingling, speech change, focal weakness and seizures.  Endo/Heme/Allergies: Negative.   Psychiatric/Behavioral: Positive for depression and substance abuse. The patient is nervous/anxious.     Blood pressure 117/71, pulse 69, temperature 97.5 F (36.4 C), temperature source Oral, resp. rate 17, height 5\' 11"  (1.803 m), weight 78.472 kg (173 lb).Body mass index is 24.14 kg/(m^2).  General Appearance: Fairly Groomed  Engineer, water::  Good  Speech:  Clear and Coherent and Normal Rate  Volume:  Normal  Mood:  Anxious and Depressed  Affect:  Restricted  Thought Process:  Circumstantial  Orientation:  Full (Time, Place, and Person)  Thought Content:  Negative  Suicidal Thoughts:  No  Homicidal Thoughts:  No  Memory:  Immediate;   Fair Recent;   Fair Remote;   Fair  Judgement:  Impaired  Insight:  Shallow  Psychomotor Activity:  Decreased  Concentration:  Fair  Recall:  [place]: Good  Akathisia:  No  Handed:  Right  AIMS (if indicated):     Assets:  Communication Skills Desire for Improvement Physical Health  Sleep:  Number of Hours: 6.5  Musculoskeletal: Strength & Muscle Tone: within normal limits Gait & Station: normal Patient leans: N/A  Current Medications: Current Facility-Administered Medications  Medication Dose Route Frequency Provider Last Rate Last Dose  . benztropine (COGENTIN) tablet 0.5 mg  0.5 mg Oral BID Waylan Boga, NP   0.5 mg at  06/11/13 0738  . chlordiazePOXIDE (LIBRIUM) capsule 25 mg  25 mg Oral Q6H PRN Conya Ellinwood      . chlordiazePOXIDE (LIBRIUM) capsule 25 mg  25 mg Oral BH-qamhs Byrd Rushlow   25 mg at 06/11/13 0738   Followed by  . [START ON 06/12/2013] chlordiazePOXIDE (LIBRIUM) capsule 25 mg  25 mg Oral Daily Mellonie Guess      . FLUoxetine (PROZAC) capsule 20 mg  20 mg Oral Daily Masaye Gatchalian   20 mg at 06/11/13 0738  . haloperidol (HALDOL) tablet 5 mg  5 mg Oral BID Waylan Boga, NP   5 mg at 06/11/13 0738  . hydrOXYzine (ATARAX/VISTARIL) tablet 25 mg  25 mg Oral Q6H PRN Staci Carver      . ibuprofen (ADVIL,MOTRIN) tablet 400 mg  400 mg Oral Q6H PRN Giovanni Bath   400 mg at 06/10/13 2143  . lisinopril (PRINIVIL,ZESTRIL) tablet 10 mg  10 mg Oral Daily Waylan Boga, NP   10 mg at 06/11/13 0738  . loperamide (IMODIUM) capsule 2-4 mg  2-4 mg Oral PRN Kynedi Profitt      . magnesium hydroxide (MILK OF MAGNESIA) suspension 30 mL  30 mL Oral Daily PRN Waylan Boga, NP      . multivitamin with minerals tablet 1 tablet  1 tablet Oral Daily Kyle Luppino   1 tablet at 06/11/13 0738  . ondansetron (ZOFRAN-ODT) disintegrating tablet 4 mg  4 mg Oral Q6H PRN Haylei Cobin      . thiamine (VITAMIN B-1) tablet 100 mg  100 mg Oral Daily Woodard Perrell   100 mg at 06/11/13 0738  . traZODone (DESYREL) tablet 100 mg  100 mg Oral QHS Waylan Boga, NP   100 mg at 06/10/13 2143    Lab Results: No results found for this or any previous visit (from the past 48 hour(s)).  Physical Findings: AIMS: Facial and Oral Movements Muscles of Facial Expression: None, normal Lips and Perioral Area: None, normal Jaw: None, normal Tongue: None, normal,Extremity Movements Upper (arms, wrists, hands, fingers): None, normal Lower (legs, knees, ankles, toes): None, normal, Trunk Movements Neck, shoulders, hips: None, normal, Overall Severity Severity of abnormal movements (highest score from questions above): None,  normal Incapacitation due to abnormal movements: None, normal Patient's awareness of abnormal movements (rate only patient's report): No Awareness, Dental Status Current problems with teeth and/or dentures?: No Does patient usually wear dentures?: No  CIWA:  CIWA-Ar Total: 0 COWS:     Treatment Plan Summary: Daily contact with patient to assess and evaluate symptoms and progress in treatment Medication management  Plan: Admit for crisis management and stabilization.  2. Medication management to reduce current symptoms to base line and improve the patient's overall level of functioning:  - Continue Librium detox protocol for alcohol withdrawal/dependence  - Continue Prozac 20mg  daily for depression.  -Continue Haldol 5mg  bid for psychosis/delusions  -Continue Trazodone 100mg  qhs as needed for insomnia  - Continue Cogentin 0.5 bid for EPS prevention.  3. Treat health problems as indicated.  4. Develop treatment plan to decrease risk of relapse upon discharge and the need for readmission.  5. Psycho-social education regarding relapse prevention and self care.  6.  Health care follow up as needed for medical problems.  Medical Decision Making Problem Points:  Established problem, improving (2), Review of last therapy session (1) and Review of psycho-social stressors (1) Data Points:  Order Aims Assessment (2) Review or order clinical lab tests (1) Review of medication regiment & side effects (2) Review of new medications or change in dosage (2)  I certify that inpatient services furnished can reasonably be expected to improve the patient's condition.   Elmarie Shiley, NP-C 06/11/2013, 1:14 PM  Patient seen, evaluated and I agree with notes by Nurse Practitioner. Corena Pilgrim, MD

## 2013-06-12 MED ORDER — NICOTINE 21 MG/24HR TD PT24
21.0000 mg | MEDICATED_PATCH | Freq: Every day | TRANSDERMAL | Status: DC
  Administered 2013-06-12 – 2013-06-14 (×3): 21 mg via TRANSDERMAL
  Filled 2013-06-12 (×6): qty 1

## 2013-06-12 MED ORDER — NICOTINE 21 MG/24HR TD PT24
MEDICATED_PATCH | TRANSDERMAL | Status: AC
Start: 2013-06-12 — End: 2013-06-13
  Administered 2013-06-12: 21 mg via TRANSDERMAL
  Filled 2013-06-12: qty 1

## 2013-06-12 NOTE — Progress Notes (Signed)
Patient ID: Keith Murray, male   DOB: December 10, 1960, 53 y.o.   MRN: [id number] [facility] MD Progress Note  06/12/2013 3:19 PM Keith Murray  MRN:  [id number] Subjective: Patient states "I'm glad to be off the alcohol. I'm grateful for the help I am receiving. I am feeling less depressed now. The drugs aren't good for me."   Objective:  Patient is visible on the unit. He appears anxious and restless on the unit. His withdrawal symptoms have been slowly stabilizing. Patient is attending groups. Notes from group indicate that the patient has poor ability to focus and often ends up leaving early due to his anxiety. The patient appears very superficial when interacting with Provider. At times he seems to have trouble processing the question that is being presented. Patient denies psychosis but could be responding to internal stimuli. He is guarded about his symptoms. However, he remains complaint with his medications and denies adverse effects.   Diagnosis:   DSM5: Schizophrenia Disorders:  History of psychosis Obsessive-Compulsive Disorders:   Trauma-Stressor Disorders:   Substance/Addictive Disorders:  Alcohol Related Disorder - Severe (303.90), Cocaine Depressive Disorders:  Major Depressive Disorder (296.99) Total Time spent with patient: 20 minutes  Axis I: Schizoaffective disorder           Alcohol dependence           Cocaine use disorder Axis II: Deferred Axis III:  Past Medical History  Diagnosis Date  . None reported    Axis IV: other psychosocial or environmental problems and problems related to social environment  ADL's:  Intact  Sleep: Good  Appetite:  Good  Suicidal Ideation: denies  Homicidal Ideation: denies  AEB (as evidenced by):  Psychiatric Specialty Exam: Physical Exam  Psychiatric: Thought content normal. His mood appears anxious. His affect is not angry, not blunt and not labile. His speech is rapid and/or pressured. He is slowed and withdrawn. Cognition and memory  are normal. He expresses impulsivity. He exhibits a depressed mood.    Review of Systems  Constitutional: Negative.   HENT: Negative.   Eyes: Negative.   Respiratory: Negative.   Cardiovascular: Negative.   Gastrointestinal: Negative.   Genitourinary: Negative.   Musculoskeletal: Negative.   Skin: Negative.   Neurological: Positive for tremors. Negative for dizziness, tingling, speech change, focal weakness and seizures.  Endo/Heme/Allergies: Negative.   Psychiatric/Behavioral: Positive for depression and substance abuse. Negative for suicidal ideas, hallucinations and memory loss. The patient is nervous/anxious. The patient does not have insomnia.     Blood pressure 103/70, pulse 80, temperature 98.9 F (37.2 C), temperature source Oral, resp. rate 18, height 5\' 11"  (1.803 m), weight 78.472 kg (173 lb).Body mass index is 24.14 kg/(m^2).  General Appearance: Fairly Groomed  Engineer, water::  Good  Speech:  Clear and Coherent and Normal Rate  Volume:  Normal  Mood:  Anxious and Depressed  Affect:  Restricted  Thought Process:  Circumstantial  Orientation:  Full (Time, Place, and Person)  Thought Content:  Negative  Suicidal Thoughts:  No  Homicidal Thoughts:  No  Memory:  Immediate;   Fair Recent;   Fair Remote;   Fair  Judgement:  Impaired  Insight:  Shallow  Psychomotor Activity:  Decreased  Concentration:  Fair  Recall:  [place]: Good  Akathisia:  No  Handed:  Right  AIMS (if indicated):     Assets:  Communication Skills Desire for Improvement Physical Health  Sleep:  Number of Hours: 6.5  Musculoskeletal: Strength & Muscle Tone: within normal limits Gait & Station: normal Patient leans: N/A  Current Medications: Current Facility-Administered Medications  Medication Dose Route Frequency Provider Last Rate Last Dose  . benztropine (COGENTIN) tablet 0.5 mg  0.5 mg Oral BID Waylan Boga, NP   0.5 mg at 06/12/13 0732  . FLUoxetine  (PROZAC) capsule 20 mg  20 mg Oral Daily Latiya Navia   20 mg at 06/12/13 0732  . haloperidol (HALDOL) tablet 5 mg  5 mg Oral BID Waylan Boga, NP   5 mg at 06/12/13 0732  . ibuprofen (ADVIL,MOTRIN) tablet 400 mg  400 mg Oral Q6H PRN Wiatt Mahabir   400 mg at 06/10/13 2143  . lisinopril (PRINIVIL,ZESTRIL) tablet 10 mg  10 mg Oral Daily Waylan Boga, NP   10 mg at 06/12/13 0733  . magnesium hydroxide (MILK OF MAGNESIA) suspension 30 mL  30 mL Oral Daily PRN Waylan Boga, NP      . multivitamin with minerals tablet 1 tablet  1 tablet Oral Daily [name]   1 tablet at 06/12/13 0733  . thiamine (VITAMIN B-1) tablet 100 mg  100 mg Oral Daily [name]   100 mg at 06/12/13 0733  . traZODone (DESYREL) tablet 100 mg  100 mg Oral QHS Waylan Boga, NP   100 mg at 06/11/13 2141    Lab Results: No results found for this or any previous visit (from the past 48 hour(s)).  Physical Findings: AIMS: Facial and Oral Movements Muscles of Facial Expression: None, normal Lips and Perioral Area: None, normal Jaw: None, normal Tongue: None, normal,Extremity Movements Upper (arms, wrists, hands, fingers): None, normal Lower (legs, knees, ankles, toes): None, normal, Trunk Movements Neck, shoulders, hips: None, normal, Overall Severity Severity of abnormal movements (highest score from questions above): None, normal Incapacitation due to abnormal movements: None, normal Patient's awareness of abnormal movements (rate only patient's report): No Awareness, Dental Status Current problems with teeth and/or dentures?: No Does patient usually wear dentures?: No  CIWA:  CIWA-Ar Total: 0 COWS:     Treatment Plan Summary: Daily contact with patient to assess and evaluate symptoms and progress in treatment Medication management  Plan: Continue crisis management and stabilization.  2. Medication management to reduce current symptoms to base line and improve the patient's overall level of  functioning:  -  Librium detox protocol for alcohol withdrawal/dependence completed today.  - Continue Prozac 20mg  daily for depression.  -Continue Haldol 5mg  bid for psychosis/delusions  -Continue Trazodone 100mg  qhs as needed for insomnia  - Continue Cogentin 0.5 bid for EPS prevention.  3. Treat health problems as indicated.  4. Develop treatment plan to decrease risk of relapse upon discharge and the need for readmission. Anticipate discharge on Friday.  5. Psycho-social education regarding relapse prevention and self care.  6. Health care follow up as needed for medical problems. Vitals reviewed and stable.   Medical Decision Making Problem Points:  Established problem, improving (2), Review of last therapy session (1) and Review of psycho-social stressors (1) Data Points:  Order Aims Assessment (2) Review of medication regiment & side effects (2) Review of new medications or change in dosage (2)  I certify that inpatient services furnished can reasonably be expected to improve the patient's condition.   Elmarie Shiley, NP-C 06/12/2013, 3:19 PM    Patient seen, evaluated and I agree with notes by Nurse Practitioner. Corena Pilgrim, MD

## 2013-06-12 NOTE — Progress Notes (Signed)
Adult Psychoeducational Group Note  Date:  06/12/2013 Time:  10:18 PM  Group Topic/Focus:  Wrap-Up Group:   The focus of this group is to help patients review their daily goal of treatment and discuss progress on daily workbooks.  Participation Level:  Active  Participation Quality:  Appropriate  Affect:  Appropriate  Cognitive:  Alert  Insight: Appropriate  Engagement in Group:  Engaged  Modes of Intervention:  Discussion  Additional Comments:  Pt stated that he had a good day and enjoyed the mental health association group. His plan for tomorrow is to work harder than he did today.  Wynelle Fanny R 06/12/2013, 10:18 PM

## 2013-06-12 NOTE — BHH Group Notes (Signed)
[facility] LCSW Aftercare Discharge Planning Group Note   06/12/2013 11:40 AM  Participation Quality:  Distracted  Mood/Affect:  Appropriate  Depression Rating:  denies  Anxiety Rating:  denies  Thoughts of Suicide:  No Will you contract for safety?   NA  Current AVH:  No  Plan for Discharge/Comments:  Keith Murray just beamed and stated that Friday sounds like a good day to go.  "I can hang with that."  Transportation Means: neices  Supports: neices  [place], [employer]

## 2013-06-12 NOTE — Progress Notes (Signed)
Patient ID: Keith Murray, male   DOB: 16-Sep-1960, 53 y.o.   MRN: [id number] D: Patient remains on librium protocol; he reports minimal withdrawal symptoms.  He presents with bright affect; pleasant mood.  He interacts well with staff and peers.  He denies any depressive symptoms, rating his depression as a 1.  He denies any SI/HI/AVH.  His goal is to continue to take his medications.  He plans to return to live with his nephew upon discharge.  Discharge plan is for Friday.  A: continue to monitor medication management and MD orders. Safety checks completed every 15 minutes per protocol.  R: patient is receptive to staff; he is pleasant and cooperative.

## 2013-06-12 NOTE — Progress Notes (Signed)
Chaplain attempted to see pt in response to spiritual care consult.  Pt requesting to see chaplain during admission to [facility].   Pt had left unit for recreation in courtyard.  Chaplain will continue to follow and attempt to make contact.   [place], [place]

## 2013-06-12 NOTE — BHH Group Notes (Signed)
Adult Psychoeducational Group Note  Date:  06/12/2013 Time:  1:39 PM  Group Topic/Focus:  Managing Feelings:   The focus of this group is to identify what feelings patients have difficulty handling and develop a plan to handle them in a healthier way upon discharge.  Participation Level:  Minimal  Participation Quality:  Appropriate  Affect:  Appropriate  Cognitive:  Appropriate  Insight: Limited  Engagement in Group:  Limited and Supportive  Modes of Intervention:  Discussion, Education and Support  Additional Comments:    Chizara Mena P 06/12/2013, 1:39 PM

## 2013-06-13 NOTE — [facility] Group Notes (Signed)
Adult Psychoeducational Group Note  Date:  06/13/2013 Time:  0900 am  Group Topic/Focus:  Orientation:   The focus of this group is to educate the patient on the purpose and policies of crisis stabilization and provide a format to answer questions about their admission.  The group details unit policies and expectations of patients while admitted.  Participation Level:  Minimal  Participation Quality:  Appropriate, Attentive, Sharing and Supportive  Affect:  Appropriate and Excited  Cognitive:  Alert and Appropriate  Insight: Good  Engagement in Group:  Engaged and Supportive  Modes of Intervention:  Discussion, Education, Orientation and Support  Additional Comments:  Pt shared appropriately and was engaged during group, had good insight about his plans after discharge, talked about the importance of staying on medications and going to follow up appointments after discharge.  Wynn Banker 06/13/2013, 1:42 PM

## 2013-06-13 NOTE — Progress Notes (Signed)
Patient ID: Keith Murray, male   DOB: [birth year], 53 y.o.   MRN: [id number] D: Patient cheerful and smiling during interaction. Pt reports looking forward to discharge tomorrow and follow up with [facility]. Pt mood and affect is appropriate to situation. Pt denies SI/HI/AVH and pain. Pt attended evening karaoke and was supportive of peers. Pt denies any needs or concerns.  Cooperative with assessment. No acute distressed noted at this time.   A: Met with pt 1:1. Medications administered as prescribed. Writer encouraged pt to discuss feelings. Pt encouraged to come to staff with any question or concerns.   R: Patient remains safe. He is complaint with medications and denies any adverse reaction. Continue current POC.

## 2013-06-13 NOTE — Progress Notes (Signed)
D: Patient resting in bed with eyes closed.  Respirations even and unlabored.  Patient appears to be in no apparent distress. A: Staff to monitor Q 15 mins for safety.   R:Patient remains safe on the unit.  

## 2013-06-13 NOTE — Tx Team (Signed)
  Interdisciplinary Treatment Plan Update   Date Reviewed:  06/13/2013  Time Reviewed:  10:23 AM  Progress in Treatment:   Attending groups: Yes Participating in groups: Yes Taking medication as prescribed: Yes  Tolerating medication: Yes Family/Significant other contact made: Yes  Patient understands diagnosis: Yes  Discussing patient identified problems/goals with staff: Yes Medical problems stabilized or resolved: Yes Denies suicidal/homicidal ideation: Yes Patient has not harmed self or others: Yes  For review of initial/current patient goals, please see plan of care.  Estimated Length of Stay:  Likely d/c tomorrow  Reason for Continuation of Hospitalization:   New Problems/Goals identified:  N/A  Discharge Plan or Barriers:   return home, follow up outpt  Additional Comments:  Attendees:  Signature: Corena Pilgrim, MD 06/13/2013 10:23 AM   Signature: Ripley Fraise, LCSW 06/13/2013 10:23 AM  Signature: Elmarie Shiley, NP 06/13/2013 10:23 AM  Signature: Mayra Neer, RN 06/13/2013 10:23 AM  Signature: Darrol Angel, RN 06/13/2013 10:23 AM  Signature:  06/13/2013 10:23 AM  Signature:   06/13/2013 10:23 AM  Signature:    Signature:    Signature:    Signature:    Signature:    Signature:      Scribe for Treatment Team:   Ripley Fraise, LCSW  06/13/2013 10:23 AM

## 2013-06-13 NOTE — [facility] Suicide Risk Assessment (Signed)
[place] INPATIENT:  Family/Significant Other Suicide Prevention Education  Suicide Prevention Education:  Education Completed; No one has been identified by the patient as the family member/significant other with whom the patient will be residing, and identified as the person(s) who will aid the patient in the event of a mental health crisis (suicidal ideations/suicide attempt).  With written consent from the patient, the family member/significant other has been provided the following suicide prevention education, prior to the and/or following the discharge of the patient.  The suicide prevention education provided includes the following:  Suicide risk factors  Suicide prevention and interventions  National Suicide Hotline telephone number  [facility] assessment telephone number  [facility] Emergency Assistance [place] and/or Residential Mobile Crisis Unit telephone number  Request made of family/significant other to:  Remove weapons (e.g., guns, rifles, knives), all items previously/currently identified as safety concern.    Remove drugs/medications (over-the-counter, prescriptions, illicit drugs), all items previously/currently identified as a safety concern.  The family member/significant other verbalizes understanding of the suicide prevention education information provided.  The family member/significant other agrees to remove the items of safety concern listed above. The patient did not endorse SI at the time of admission, nor did the patient c/o SI during the stay here.  SPE not required.  I spoke with niece Keith Murray at [phone].  Between she and her sister, someone checks on Keith Murray daily.  Two days prior to coming in to hospital, he was not at home at the appointed times to take his meds.  Keith Murray 06/13/2013, 10:27 AM

## 2013-06-13 NOTE — Progress Notes (Signed)
D:  Per pt self inventory pt reports sleeping well, appetite good, energy level normal, ability to pay attention good, rates depression at a 1 out of 10 and hopelessness at a 1 out of 10, denies SI/HI/AVH, bright and smiling during interaction     A:  Emotional support provided, Encouraged pt to continue with treatment plan and attend all group activities, q15 min checks maintained for safety.  R:  Pt is receptive, going to groups, insight is improving, pt plans to stay on his medications and go to his f/u appointments after discharge, pt is very pleasant and cooperates well with staff and other patients.

## 2013-06-13 NOTE — [facility] Group Notes (Signed)
[place] Group Notes:  (Counselor/Nursing/MHT/Case Management/Adjunct)  06/13/2013 1:15PM  Type of Therapy:  Group Therapy  Participation Level:  Active  Participation Quality:  Appropriate  Affect:  Flat  Cognitive:  Oriented  Insight:  Improving  Engagement in Group:  Limited  Engagement in Therapy:  Limited  Modes of Intervention:  Discussion, Exploration and Socialization  Summary of Progress/Problems: The topic for group was balance in life.  Pt participated in the discussion about when their life was in balance and out of balance and how this feels.  Pt discussed ways to get back in balance and short term goals they can work on to get where they want to be. Ramon sat quietly and smiled.  He got up and left and later returned, per usual.   Roque Lias B 06/13/2013 4:00 PM

## 2013-06-13 NOTE — Progress Notes (Signed)
Patient ID: Keith Murray, male   DOB: 07/03/1960, 53 y.o.   MRN: [id number] [facility] MD Progress Note  06/13/2013 11:15 AM Keith Murray  MRN:  [id number] Subjective: Patient states "I am doing well doc, I will take my medication and stopped using drugs when I get out of here.'' Objective: Patient seen and chart is reviewed. Patient reports decreased mood swings and  Anxiety. He denies cravings for alcohol and illicit drugs. Patient denies any psychosis, delusions or suicidal thoughts. He is compliant with alcohol detoxification and denies adverse reactions to his medications. Diagnosis:   DSM5: Schizophrenia Disorders:  History of psychosis Obsessive-Compulsive Disorders:   Trauma-Stressor Disorders:   Substance/Addictive Disorders:  Alcohol Related Disorder - Severe (303.90), Cocaine Depressive Disorders:  Major Depressive Disorder (296.99) Total Time spent with patient: 20 minutes  Axis I: Schizoaffective disorder           Alcohol dependence           Cocaine use disorder Axis II: Deferred Axis III:  Past Medical History  Diagnosis Date  . None reported    Axis IV: other psychosocial or environmental problems and problems related to social environment  ADL's:  Intact  Sleep: Fair  Appetite:  Fair  Suicidal Ideation: denies  Homicidal Ideation: denies  AEB (as evidenced by):  Psychiatric Specialty Exam: Physical Exam  Psychiatric: His speech is normal and behavior is normal. Thought content normal. His mood appears anxious. His affect is not angry, not blunt and not labile. Cognition and memory are normal. He expresses impulsivity.    Review of Systems  Constitutional: Negative.   Eyes: Negative.   Respiratory: Negative.   Gastrointestinal: Negative.   Genitourinary: Negative.   Musculoskeletal: Negative for back pain, falls, joint pain and neck pain.  Skin: Negative.   Neurological: Positive for tremors. Negative for dizziness, tingling, speech change, focal weakness  and seizures.  Endo/Heme/Allergies: Negative.   Psychiatric/Behavioral: Positive for substance abuse. The patient is nervous/anxious.     Blood pressure 125/87, pulse 91, temperature 97.9 F (36.6 C), temperature source Oral, resp. rate 18, height 5\' 11"  (1.803 m), weight 78.472 kg (173 lb).Body mass index is 24.14 kg/(m^2).  General Appearance: Fairly Groomed  Engineer, water::  Good  Speech:  Clear and Coherent and Normal Rate  Volume:  Normal  Mood:  Anxious and Depressed  Affect:  Restricted  Thought Process:  Circumstantial  Orientation:  Full (Time, Place, and Person)  Thought Content:  Negative  Suicidal Thoughts:  No  Homicidal Thoughts:  No  Memory:  Immediate;   Fair Recent;   Fair Remote;   Fair  Judgement:  Impaired  Insight:  Shallow  Psychomotor Activity:  Decreased  Concentration:  Fair  Recall:  [place]: Good  Akathisia:  No  Handed:  Right  AIMS (if indicated):     Assets:  Communication Skills Desire for Improvement Physical Health  Sleep:  Number of Hours: 6   Musculoskeletal: Strength & Muscle Tone: within normal limits Gait & Station: normal Patient leans: N/A  Current Medications: Current Facility-Administered Medications  Medication Dose Route Frequency Provider Last Rate Last Dose  . benztropine (COGENTIN) tablet 0.5 mg  0.5 mg Oral BID Waylan Boga, NP   0.5 mg at 06/13/13 0754  . FLUoxetine (PROZAC) capsule 20 mg  20 mg Oral Daily Anijah Spohr   20 mg at 06/13/13 0754  . haloperidol (HALDOL) tablet 5 mg  5 mg Oral BID Waylan Boga, NP  5 mg at 06/13/13 0754  . ibuprofen (ADVIL,MOTRIN) tablet 400 mg  400 mg Oral Q6H PRN Chaslyn Eisen   400 mg at 06/12/13 2001  . lisinopril (PRINIVIL,ZESTRIL) tablet 10 mg  10 mg Oral Daily Waylan Boga, NP   10 mg at 06/13/13 0754  . magnesium hydroxide (MILK OF MAGNESIA) suspension 30 mL  30 mL Oral Daily PRN Waylan Boga, NP      . multivitamin with minerals tablet 1 tablet  1  tablet Oral Daily Leela Vanbrocklin   1 tablet at 06/13/13 0754  . nicotine (NICODERM CQ - dosed in mg/24 hours) patch 21 mg  21 mg Transdermal Daily Mccormick Macon   21 mg at 06/13/13 0756  . thiamine (VITAMIN B-1) tablet 100 mg  100 mg Oral Daily Carlette Palmatier   100 mg at 06/13/13 0754  . traZODone (DESYREL) tablet 100 mg  100 mg Oral QHS Waylan Boga, NP   100 mg at 06/11/13 2141    Lab Results: No results found for this or any previous visit (from the past 48 hour(s)).  Physical Findings: AIMS: Facial and Oral Movements Muscles of Facial Expression: None, normal Lips and Perioral Area: None, normal Jaw: None, normal Tongue: None, normal,Extremity Movements Upper (arms, wrists, hands, fingers): None, normal Lower (legs, knees, ankles, toes): None, normal, Trunk Movements Neck, shoulders, hips: None, normal, Overall Severity Severity of abnormal movements (highest score from questions above): None, normal Incapacitation due to abnormal movements: None, normal Patient's awareness of abnormal movements (rate only patient's report): No Awareness, Dental Status Current problems with teeth and/or dentures?: No Does patient usually wear dentures?: No  CIWA:  CIWA-Ar Total: 0 COWS:     Treatment Plan Summary: Daily contact with patient to assess and evaluate symptoms and progress in treatment Medication management  Plan: Admit for crisis management and stabilization.  2. Medication management to reduce current symptoms to base line and improve the patient's overall level of functioning:  - Continue Librium detox protocol for alcohol withdrawal/dependence  - Continue Prozac 20mg  daily for depression.  -Continue Haldol 5mg  bid for psychosis/delusions  -Continue Trazodone 100mg  qhs as needed for insomnia  - Continue Cogentin 0.5 bid for EPS prevention.  3. Treat health problems as indicated.  4. Develop treatment plan to decrease risk of relapse upon discharge and the need for  readmission.  5. Psycho-social education regarding relapse prevention and self care.  6. Health care follow up as needed for medical problems.  Medical Decision Making Problem Points:  Established problem, improving (2), Review of last therapy session (1) and Review of psycho-social stressors (1) Data Points:  Order Aims Assessment (2) Review or order clinical lab tests (1) Review of medication regiment & side effects (2) Review of new medications or change in dosage (2)  I certify that inpatient services furnished can reasonably be expected to improve the patient's condition.   Cailey Trigueros,MD 06/13/2013, 11:15 AM

## 2013-06-14 MED ORDER — BENZTROPINE MESYLATE 0.5 MG PO TABS: 0.5000 mg | ORAL_TABLET | Freq: Two times a day (BID) | ORAL | Status: DC

## 2013-06-14 MED ORDER — TRAZODONE HCL 100 MG PO TABS: 100.0000 mg | ORAL_TABLET | Freq: Every day | ORAL | Status: DC

## 2013-06-14 MED ORDER — ADULT MULTIVITAMIN W/MINERALS CH: 1.0000 | ORAL_TABLET | Freq: Every day | ORAL | Status: DC

## 2013-06-14 MED ORDER — FLUOXETINE HCL 20 MG PO CAPS: 20.0000 mg | ORAL_CAPSULE | Freq: Every day | ORAL | Status: DC

## 2013-06-14 MED ORDER — LISINOPRIL 10 MG PO TABS: 10.0000 mg | ORAL_TABLET | Freq: Every day | ORAL | Status: DC

## 2013-06-14 MED ORDER — HALOPERIDOL 5 MG PO TABS: 5.0000 mg | ORAL_TABLET | Freq: Two times a day (BID) | ORAL | Status: DC

## 2013-06-14 NOTE — [facility] Suicide Risk Assessment (Signed)
   Demographic Factors:  Male, Low socioeconomic status, Living alone and African American  Total Time spent with patient: 20 minutes  Psychiatric Specialty Exam: Physical Exam  Psychiatric: He has a normal mood and affect. His speech is normal and behavior is normal. Judgment and thought content normal. Cognition and memory are normal.    Review of Systems  Constitutional: Negative.   HENT: Negative.   Eyes: Negative.   Respiratory: Negative.   Cardiovascular: Negative.   Gastrointestinal: Negative.   Genitourinary: Negative.   Musculoskeletal: Negative.   Skin: Negative.   Neurological: Negative.   Endo/Heme/Allergies: Negative.   Psychiatric/Behavioral: Negative.     Blood pressure 105/74, pulse 88, temperature 97.8 F (36.6 C), temperature source Oral, resp. rate 16, height 5\' 11"  (1.803 m), weight 78.472 kg (173 lb).Body mass index is 24.14 kg/(m^2).  General Appearance: Fairly Groomed  Engineer, water::  Fair  Speech:  Clear and Coherent  Volume:  Normal  Mood:  Euthymic  Affect:  Appropriate  Thought Process:  Goal Directed  Orientation:  Full (Time, Place, and Person)  Thought Content:  Negative  Suicidal Thoughts:  No  Homicidal Thoughts:  No  Memory:  Immediate;   Fair Recent;   Fair Remote;   Fair  Judgement:  Fair  Insight:  Fair  Psychomotor Activity:  Normal  Concentration:  Fair  Recall:  [employer]  Language: Fair  Akathisia:  No  Handed:  Right  AIMS (if indicated):     Assets:  Communication Skills Desire for Improvement Physical Health  Sleep:  Number of Hours: 6.75    Musculoskeletal: Strength & Muscle Tone: within normal limits Gait & Station: normal Patient leans: N/A   Mental Status Per Nursing Assessment::   On Admission:  NA  Current Mental Status by Physician: patient denies suicidal ideation, intent or plan  Loss Factors: Decrease in vocational status and Financial problems/change in socioeconomic  status  Historical Factors: NA  Risk Reduction Factors:   Sense of responsibility to family, Living with another person, especially a relative and Positive social support  Continued Clinical Symptoms:  Alcohol/Substance Abuse/Dependencies  Cognitive Features That Contribute To Risk:  Closed-mindedness    Suicide Risk:  Minimal: No identifiable suicidal ideation.  Patients presenting with no risk factors but with morbid ruminations; may be classified as minimal risk based on the severity of the depressive symptoms  Discharge Diagnoses:   AXIS I:  Schizoaffective disorder              Alcohol dependence AXIS II:  Deferred AXIS III:   Past Medical History  Diagnosis Date  . Mental disorder   . Anxiety   . Bipolar disorder    AXIS IV:  other psychosocial or environmental problems and problems related to social environment AXIS V:  61-70 mild symptoms  Plan Of Care/Follow-up recommendations:  Activity:  as tolerated Diet:  healthy Tests:  routine Other:  patient to keep his after care appointment  Is patient on multiple antipsychotic therapies at discharge:  No   Has Patient had three or more failed trials of antipsychotic monotherapy by history:  No  Recommended Plan for Multiple Antipsychotic Therapies: NA    Corena Pilgrim, MD 06/14/2013, 9:30 AM

## 2013-06-14 NOTE — Progress Notes (Signed)
[facility] Adult Case Management Discharge Plan :  Will you be returning to the same living situation after discharge: Yes,  home At discharge, do you have transportation home?:Yes,  family Do you have the ability to pay for your medications:Yes,  mental health  Release of information consent forms completed and in the chart;  Patient's signature needed at discharge.  Patient to Follow up at: Follow-up Information   Follow up with [facility]. (Go to the walk-in clinic M-F between 8 and 9AM for your hospital follow up appointment)    Contact information:   [place] [phone]      Patient denies SI/HI:   Yes,  yes    Safety Planning and Suicide Prevention discussed:  Yes,  yes  Trish Mage 06/14/2013, 12:20 PM

## 2013-06-14 NOTE — Progress Notes (Signed)
Patient ID: Shylo Dillenbeck, male   DOB: 12/15/1960, 53 y.o.   MRN: [id number] D. Patient presents with euthymic mood,affect congruent. He states ''I'm doing fine, I'm supposed to be going home today'' Deontay denies any acute concerns and states he feels good about discharge. Discussed follow up plans, recovery plans and patient states '' I know I need to stay away from that cocaine and alcohol I see where it gets me '' He reports he plans to follow up with [facility]. A. Discussed above information with treatment team and MD. Medications given as ordered. Support and encouragement provided. R. Orders received for discharge. Discharge instructions reviewed and copy of AVS provided. Pt verbalized understanding of follow up plan. Rx provided as well as 3 day free supply of medications. Crisis services reviewed. Pt denies any SI/HI/A/V Hallucinations. No signs of acute decompensation. All belongings returned. Pt escorted from unit to lobby.

## 2013-06-14 NOTE — Discharge Summary (Signed)
Physician Discharge Summary Note  Patient:  Keith Murray is an 53 y.o., male MRN:  [id number] DOB:  01-01-1961 Patient phone:  [phone] (home)  Patient address:   [address] [place] [address],  Total Time spent with patient: 20 minutes  Date of Admission:  06/08/2013 Date of Discharge: 06/14/13  Reason for Admission:  Alcohol abuse, Depressive symptoms  Discharge Diagnoses: Principal Problem:   Schizoaffective disorder Active Problems:   Alcohol dependence   Psychiatric Specialty Exam: Physical Exam  Psychiatric: He has a normal mood and affect. His speech is normal and behavior is normal. Judgment and thought content normal. Cognition and memory are normal.    Review of Systems  Constitutional: Negative.   HENT: Negative.   Eyes: Negative.   Respiratory: Negative.   Cardiovascular: Negative.   Gastrointestinal: Negative.   Genitourinary: Negative.   Musculoskeletal: Negative.   Skin: Negative.   Neurological: Negative.   Endo/Heme/Allergies: Negative.   Psychiatric/Behavioral: Negative for depression, suicidal ideas, hallucinations, memory loss and substance abuse. The patient is not nervous/anxious and does not have insomnia.     Blood pressure 105/74, pulse 88, temperature 97.8 F (36.6 C), temperature source Oral, resp. rate 16, height 5\' 11"  (1.803 m), weight 78.472 kg (173 lb).Body mass index is 24.14 kg/(m^2).  General Appearance: Fairly Groomed  Engineer, water::  Fair  Speech:  Clear and Coherent  Volume:  Normal  Mood:  Euthymic  Affect:  Appropriate  Thought Process:  Goal Directed  Orientation:  Full (Time, Place, and Person)  Thought Content:  Negative  Suicidal Thoughts:  No  Homicidal Thoughts:  No  Memory:  Immediate;   Fair Recent;   Fair Remote;   Fair  Judgement:  Fair  Insight:  Fair  Psychomotor Activity:  Normal  Concentration:  Fair  Recall:  [employer] of Knowledge:Fair  Language: Fair  Akathisia:  No  Handed:  Right  AIMS (if  indicated):     Assets:  Communication Skills Desire for Improvement Physical Health  Sleep:  Number of Hours: 6.75    Past Psychiatric History:  Diagnosis: Schizoaffective disorder, Alcohol dependence, Cocaine abuse  Hospitalizations: [facility]  Outpatient Care: [facility]   Substance Abuse Care:  Self-Mutilation:Denies  Suicidal Attempts: Denies  Violent Behaviors: Denies   Musculoskeletal: Strength & Muscle Tone: within normal limits Gait & Station: normal Patient leans: N/A  DSM5:  AXIS I: Schizoaffective disorder  Alcohol dependence  AXIS II: Deferred  AXIS III:  Past Medical History   Diagnosis  Date   .  Mental disorder    .  Anxiety    .  Bipolar disorder     AXIS IV: other psychosocial or environmental problems and problems related to social environment  AXIS V: 61-70 mild symptoms  Level of Care:  OP  Hospital Course:  Patient is a 53 year old man with history of Schizoaffective disorder and polysubstance abuse who was brought to ED by the police Dept activated by his family. Per the chart, his family called the police after he was found acting bizarre and intoxicated with alcohol, his alcohol level at ED was 321. Patient reports that he was not taking his medication after he was discharged from hospital few months ago, also has not kept up with his after care at [facility]. Instead he has been self medicating with crack cocaine and Alcohol. Patient denies delusion or psychotic symptoms but reports vague depressive symptoms, characterized by anhedonia, lack of motivation, feeling hopeless, poor concentration and difficulty  sleeping. His thought process is disorganized. He denies suicidal or homicidal ideations. He is requesting for alcohol detoxification and to get back on his medications. He is showing sign of alcohol withdrawal characterized by nausea, diaphoresis, headache, anxiety, nervousness and bilateral hand tremors. Patient has been struggling with alcohol for over 30  years.  Patient was admitted to the 400 hall for further assessment and medication management. He was started on Prozac 20 mg daily for depressive symptoms. His Haldol 5 mg BID and Cogentin 0.5 mg BID were restarted. Patient completed the librium protocol for alcohol withdrawal. Keith Murray was very pleasant and active on the unit. Patient showed insight as to how drugs were causing problems in his life. He appeared motivated to keep taking his prescribed medications after discharge. Keith Murray was compliant with his medications and denied any adverse effects. He did not demonstrate any behavioral problems. Patient reported decreased mood swings and anxiety. He denied cravings for alcohol and illicit drugs. Prior to his discharge the patient appeared happy and cheerful. Patient was found stable for discharge. He will follow up at [facility] for medication management. The patient has good support from his family. Patient was started on Lisinopril 10 mg daily for elevated blood pressure. He was given a prescription at discharge and encouraged to follow up with his Primary Care Provider. Patient was found stable for discharge. He was provided with prescriptions and sample medications. Patient left [facility] in no acute distress.   Consults:  None  Significant Diagnostic Studies:  UDS positive for cocaine, Alcohol level on admission 321  Discharge Vitals:   Blood pressure 105/74, pulse 88, temperature 97.8 F (36.6 C), temperature source Oral, resp. rate 16, height 5\' 11"  (1.803 m), weight 78.472 kg (173 lb). Body mass index is 24.14 kg/(m^2). Lab Results:   No results found for this or any previous visit (from the past 72 hour(s)).  Physical Findings: AIMS: Facial and Oral Movements Muscles of Facial Expression: None, normal Lips and Perioral Area: None, normal Jaw: None, normal Tongue: None, normal,Extremity Movements Upper (arms, wrists, hands, fingers): None, normal Lower (legs, knees, ankles, toes): None,  normal, Trunk Movements Neck, shoulders, hips: None, normal, Overall Severity Severity of abnormal movements (highest score from questions above): None, normal Incapacitation due to abnormal movements: None, normal Patient's awareness of abnormal movements (rate only patient's report): No Awareness, Dental Status Current problems with teeth and/or dentures?: No Does patient usually wear dentures?: No  CIWA:  CIWA-Ar Total: 0 COWS:     Psychiatric Specialty Exam: See Psychiatric Specialty Exam and Suicide Risk Assessment completed by Attending Physician prior to discharge.  Discharge destination:  Home  Is patient on multiple antipsychotic therapies at discharge:  No   Has Patient had three or more failed trials of antipsychotic monotherapy by history:  No  Recommended Plan for Multiple Antipsychotic Therapies: NA      Discharge Orders   Future Orders Complete By Expires   Discharge instructions  As directed    Comments:     Please follow up with Primary Care MD for further management of high blood pressure. You were started on Lisinopril 10 mg daily for this condition and given a prescription to last thirty days.       Medication List       Indication   benztropine 0.5 MG tablet  Commonly known as:  COGENTIN  Take 1 tablet (0.5 mg total) by mouth 2 (two) times daily.   Indication:  Extrapyramidal Reaction caused by Medications  FLUoxetine 20 MG capsule  Commonly known as:  PROZAC  Take 1 capsule (20 mg total) by mouth daily.   Indication:  Excessive Use of Alcohol, Depression     haloperidol 5 MG tablet  Commonly known as:  HALDOL  Take 1 tablet (5 mg total) by mouth 2 (two) times daily.   Indication:  Psychosis     lisinopril 10 MG tablet  Commonly known as:  PRINIVIL,ZESTRIL  Take 1 tablet (10 mg total) by mouth daily.   Indication:  High Blood Pressure     multivitamin with minerals Tabs tablet  Take 1 tablet by mouth daily. May purchase over the counter.    Indication:  Vitamin Supplementation     traZODone 100 MG tablet  Commonly known as:  DESYREL  Take 1 tablet (100 mg total) by mouth at bedtime.   Indication:  Trouble Sleeping       Follow-up Information   Follow up with [facility]. (Go to the walk-in clinic M-F between 8 and 9AM for your hospital follow up appointment)    Contact information:   [place] [phone]      Follow-up recommendations:   Activity: as tolerated  Diet: healthy  Tests: routine  Other: patient to keep his after care appointment   Comments:  Take all your medications as prescribed by your mental healthcare provider.  Report any adverse effects and or reactions from your medicines to your outpatient provider promptly.  Patient is instructed and cautioned to not engage in alcohol and or illegal drug use while on prescription medicines.  In the event of worsening symptoms, patient is instructed to call the crisis hotline, 911 and or go to the nearest ED for appropriate evaluation and treatment of symptoms.  Follow-up with your primary care provider for your other medical issues, concerns and or health care needs.   Total Discharge Time:  Greater than 30 minutes.  SignedElmarie Shiley NP-C 06/14/2013, 4:56 PM  Patient seen, evaluated and I agree with notes by Nurse Practitioner. Corena Pilgrim, MD

## 2013-06-19 NOTE — Progress Notes (Signed)
Patient Discharge Instructions:  After Visit Summary (AVS):   Faxed to:  06/19/13 Discharge Summary Note:   Faxed to:  06/19/13 Psychiatric Admission Assessment Note:   Faxed to:  06/19/13 Suicide Risk Assessment - Discharge Assessment:   Faxed to:  06/19/13 Faxed/Sent to the Next Level Care provider:  06/19/13 Faxed to [facility] [place], 06/19/2013, 3:44 PM

## 2014-06-09 ENCOUNTER — Emergency Department (HOSPITAL_COMMUNITY)
Admission: EM | Admit: 2014-06-09 | Discharge: 2014-06-09 | Discharge status: Against Medical Advice | Payer: Self-pay | Attending: Emergency Medicine | Admitting: Emergency Medicine

## 2014-06-09 ENCOUNTER — Encounter (HOSPITAL_COMMUNITY): Payer: Self-pay | Admitting: Emergency Medicine

## 2014-06-09 DIAGNOSIS — M25562 Pain in left knee: Secondary | ICD-10-CM | POA: Insufficient documentation

## 2014-06-09 DIAGNOSIS — Z720 Tobacco use: Secondary | ICD-10-CM | POA: Insufficient documentation

## 2014-06-09 NOTE — ED Notes (Signed)
Per EMS: pt c/o left knee pain and generalized weakness in hand and legs; pt sts he thinks he over exerted himself while doing yard work

## 2014-06-09 NOTE — ED Notes (Signed)
Pt taking pictures of staff in nurse first. Security paged and made aware.

## 2014-06-09 NOTE — ED Notes (Signed)
Called for pt in lobby x 2.

## 2014-07-23 ENCOUNTER — Emergency Department (HOSPITAL_COMMUNITY)
Admission: EM | Admit: 2014-07-23 | Discharge: 2014-07-23 | Disposition: A | Discharge status: Home / Self Care | Payer: 59 | Attending: Emergency Medicine | Admitting: Emergency Medicine

## 2014-07-23 ENCOUNTER — Inpatient Hospital Stay (HOSPITAL_COMMUNITY): Admission: AD | Admit: 2014-07-23 | Payer: 59 | Source: Intra-hospital | Admitting: Psychiatry

## 2014-07-23 DIAGNOSIS — F1210 Cannabis abuse, uncomplicated: Secondary | ICD-10-CM | POA: Insufficient documentation

## 2014-07-23 DIAGNOSIS — F259 Schizoaffective disorder, unspecified: Secondary | ICD-10-CM | POA: Diagnosis present

## 2014-07-23 DIAGNOSIS — F22 Delusional disorders: Secondary | ICD-10-CM | POA: Insufficient documentation

## 2014-07-23 DIAGNOSIS — F1020 Alcohol dependence, uncomplicated: Secondary | ICD-10-CM

## 2014-07-23 DIAGNOSIS — F419 Anxiety disorder, unspecified: Secondary | ICD-10-CM | POA: Insufficient documentation

## 2014-07-23 DIAGNOSIS — F250 Schizoaffective disorder, bipolar type: Secondary | ICD-10-CM | POA: Diagnosis not present

## 2014-07-23 DIAGNOSIS — Z79899 Other long term (current) drug therapy: Secondary | ICD-10-CM | POA: Insufficient documentation

## 2014-07-23 DIAGNOSIS — F1410 Cocaine abuse, uncomplicated: Secondary | ICD-10-CM | POA: Insufficient documentation

## 2014-07-23 DIAGNOSIS — F1920 Other psychoactive substance dependence, uncomplicated: Secondary | ICD-10-CM | POA: Diagnosis present

## 2014-07-23 DIAGNOSIS — Z720 Tobacco use: Secondary | ICD-10-CM | POA: Insufficient documentation

## 2014-07-23 LAB — COMPREHENSIVE METABOLIC PANEL
ALT: 25 U/L (ref 0–53)
AST: 35 U/L (ref 0–37)
Albumin: 4.7 g/dL (ref 3.5–5.2)
Alkaline Phosphatase: 105 U/L (ref 39–117)
Anion gap: 8 (ref 5–15)
BUN: 13 mg/dL (ref 6–23)
CO2: 23 mmol/L (ref 19–32)
Calcium: 9.1 mg/dL (ref 8.4–10.5)
Chloride: 103 mmol/L (ref 96–112)
Creatinine, Ser: 1.09 mg/dL (ref 0.50–1.35)
GFR calc Af Amer: 88 mL/min — ABNORMAL LOW (ref >90)
GFR calc non Af Amer: 76 mL/min — ABNORMAL LOW (ref >90)
Glucose, Bld: 101 mg/dL — ABNORMAL HIGH (ref 70–99)
Potassium: 3.3 mmol/L — ABNORMAL LOW (ref 3.5–5.1)
Sodium: 134 mmol/L — ABNORMAL LOW (ref 135–145)
Total Bilirubin: 1.3 mg/dL — ABNORMAL HIGH (ref 0.3–1.2)
Total Protein: 8.1 g/dL (ref 6.0–8.3)

## 2014-07-23 LAB — RAPID URINE DRUG SCREEN, HOSP PERFORMED
Amphetamines: NOT DETECTED
Barbiturates: NOT DETECTED
Benzodiazepines: NOT DETECTED
Cocaine: POSITIVE — AB
Opiates: NOT DETECTED
Tetrahydrocannabinol: POSITIVE — AB

## 2014-07-23 LAB — CBC
HCT: 41.5 % (ref 39.0–52.0)
Hemoglobin: 14.2 g/dL (ref 13.0–17.0)
MCH: 30.3 pg (ref 26.0–34.0)
MCHC: 34.2 g/dL (ref 30.0–36.0)
MCV: 88.7 fL (ref 78.0–100.0)
Platelets: 261 10*3/uL (ref 150–400)
RBC: 4.68 MIL/uL (ref 4.22–5.81)
RDW: 13.7 % (ref 11.5–15.5)
WBC: 6.2 10*3/uL (ref 4.0–10.5)

## 2014-07-23 LAB — ETHANOL: Alcohol, Ethyl (B): 60 mg/dL — ABNORMAL HIGH (ref 0–9)

## 2014-07-23 MED ORDER — FLUOXETINE HCL 20 MG PO CAPS
20.0000 mg | ORAL_CAPSULE | Freq: Every day | ORAL | Status: DC
  Administered 2014-07-23: 20 mg via ORAL
  Filled 2014-07-23 (×2): qty 1

## 2014-07-23 MED ORDER — IBUPROFEN 200 MG PO TABS
600.0000 mg | ORAL_TABLET | Freq: Three times a day (TID) | ORAL | Status: DC | PRN
  Administered 2014-07-23: 600 mg via ORAL
  Filled 2014-07-23: qty 3

## 2014-07-23 MED ORDER — POTASSIUM CHLORIDE CRYS ER 20 MEQ PO TBCR
40.0000 meq | EXTENDED_RELEASE_TABLET | Freq: Once | ORAL | Status: AC
  Administered 2014-07-23: 40 meq via ORAL
  Filled 2014-07-23: qty 2

## 2014-07-23 MED ORDER — TRAZODONE HCL 100 MG PO TABS: 100.0000 mg | ORAL_TABLET | Freq: Every day | ORAL | Status: DC

## 2014-07-23 MED ORDER — BENZTROPINE MESYLATE 1 MG PO TABS
0.5000 mg | ORAL_TABLET | Freq: Two times a day (BID) | ORAL | Status: DC
  Administered 2014-07-23: 0.5 mg via ORAL
  Filled 2014-07-23: qty 1

## 2014-07-23 MED ORDER — LISINOPRIL 10 MG PO TABS
10.0000 mg | ORAL_TABLET | Freq: Every day | ORAL | Status: DC
  Administered 2014-07-23: 10 mg via ORAL
  Filled 2014-07-23 (×2): qty 1

## 2014-07-23 MED ORDER — HALOPERIDOL 5 MG PO TABS
5.0000 mg | ORAL_TABLET | Freq: Two times a day (BID) | ORAL | Status: DC
  Administered 2014-07-23: 5 mg via ORAL
  Filled 2014-07-23: qty 1
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
## 2014-07-23 NOTE — ED Notes (Signed)
Pt reports he has two family members that work at [employer] [phone] and Tera [phone]

## 2014-07-23 NOTE — [facility] Suicide Risk Assessment (Signed)
Suicide Risk Assessment  Discharge Assessment   [facility] Discharge Suicide Risk Assessment   Demographic Factors:  Male and Living alone  Total Time spent with patient: 45 minutes  Musculoskeletal: Strength & Muscle Tone: within normal limits Gait & Station: normal Patient leans: N/A  Psychiatric Specialty Exam:     Blood pressure 157/94, pulse 85, temperature 97.8 F (36.6 C), temperature source Oral, resp. rate 16, height '6\' 2"'$  (1.88 m), weight 193 lb (87.544 kg), SpO2 99 %.Body mass index is 24.77 kg/(m^2).  General Appearance: Casual  Eye Contact::  Good  Speech:  Normal Rate409  Volume:  Normal  Mood:  Euthymic  Affect:  Congruent  Thought Process:  Coherent  Orientation:  Full (Time, Place, and Person)  Thought Content:  WDL  Suicidal Thoughts:  No  Homicidal Thoughts:  No  Memory:  Immediate;   Good Recent;   Good Remote;   Good  Judgement:  Fair  Insight:  Fair  Psychomotor Activity:  Normal  Concentration:  Good  Recall:  Good  Fund of Knowledge:Good  Language: Good  Akathisia:  No  Handed:  Right  AIMS (if indicated):     Assets:  Housing Leisure Time Physical Health Resilience Social Support Vocational/Educational  Sleep:     Cognition: WNL  ADL's:  Intact      Has this patient used any form of tobacco in the last 30 days? (Cigarettes, Smokeless Tobacco, Cigars, and/or Pipes) Yes, A prescription for an FDA-approved tobacco cessation medication was offered at discharge and the patient refused  Mental Status Per Nursing Assessment::   On Admission:     Current Mental Status by Physician: NA  Loss Factors: NA  Historical Factors: NA  Risk Reduction Factors:   Sense of responsibility to family, Employed, Positive social support, Positive therapeutic relationship and Positive coping skills or problem solving skills  Continued Clinical Symptoms:  None  Cognitive Features That Contribute To Risk:  None    Suicide Risk:  Minimal: No  identifiable suicidal ideation.  Patients presenting with no risk factors but with morbid ruminations; may be classified as minimal risk based on the severity of the depressive symptoms  Principal Problem: Schizoaffective disorder Discharge Diagnoses:  Patient Active Problem List   Diagnosis Date Noted  . Alcohol dependence [F10.20] 06/08/2013    Priority: High  . Schizoaffective disorder [F25.9] 08/08/2012    Priority: High  . Polysubstance (excluding opioids) dependence [F19.20] 08/08/2012    Priority: High  . Psychosis [F29] 08/04/2012    Priority: High      Plan Of Care/Follow-up recommendations:  Activity:  as tolerated Diet:  heart healthy diet  Is patient on multiple antipsychotic therapies at discharge:  No   Has Patient had three or more failed trials of antipsychotic monotherapy by history:  No  Recommended Plan for Multiple Antipsychotic Therapies: NA    Ericson Nafziger, PMH-NP 07/23/2014, 11:34 AM

## 2014-07-23 NOTE — ED Notes (Signed)
Pt wanded by security. 

## 2014-07-23 NOTE — ED Notes (Addendum)
Pt reports to this RN that he sees and hears things but he does not elaborate on what those things are. He appears to be very reserved at this time. Pt thanks me for listening to his lungs then proceeds to blow me kisses. Pt reports that he hurts all over.  He denies SI or HI. Patient behavior seems bizarre. Will continue to monitor.

## 2014-07-23 NOTE — ED Notes (Addendum)
Patient belongings placed in locker #37 Patient belongings include: shoes, socks, blue bracelets, blue shirt, black pants, neck ties, tan belt, blue jacket, and black wallet. Patient belongings wanded by security

## 2014-07-23 NOTE — ED Notes (Signed)
Plan for patient is to be discharged home.

## 2014-07-23 NOTE — ED Notes (Signed)
Psychiatry at bedside.
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
## 2014-07-23 NOTE — Discharge Instructions (Addendum)
For your ongoing mental health needs, you are advised to follow up with [facility].  New and returning patients are seen at their walk-in clinic.  Walk-in hours are Monday - Friday from 8:00 am - 3:00 pm.  Walk-in patients are seen on a first come, first served basis.  Try to arrive as early as possible for he best chance of being seen the same day:       [facility]      [address], [place] [address]      [phone] Chemical Dependency Chemical dependency is an addiction to drugs or alcohol. It is characterized by the repeated behavior of seeking out and using drugs and alcohol despite harmful consequences to the health and safety of ones self and others.  RISK FACTORS There are certain situations or behaviors that increase a person's risk for chemical dependency. These include:  A family history of chemical dependency.  A history of mental health issues, including depression and anxiety.  A home environment where drugs and alcohol are easily available to you.  Drug or alcohol use at a young age. SYMPTOMS  The following symptoms can indicate chemical dependency:  Inability to limit the use of drugs or alcohol.  Nausea, sweating, shakiness, and anxiety that occurs when alcohol or drugs are not being used.  An increase in amount of drugs or alcohol that is necessary to get drunk or high. People who experience these symptoms can assess their use of drugs and alcohol by asking themselves the following questions:  Have you been told by friends or family that they are worried about your use of alcohol or drugs?  Do friends and family ever tell you about things you did while drinking alcohol or using drugs that you do not remember?  Do you lie about using alcohol or drugs or about the amounts you use?  Do you have difficulty completing daily tasks unless you use alcohol or drugs?  Is the level of your work or school performance lower because of your drug or alcohol use?  Do  you get sick from using drugs or alcohol but keep using anyway?  Do you feel uncomfortable in social situations unless you use alcohol or drugs?  Do you use drugs or alcohol to help forget problems? An answer of yes to any of these questions may indicate chemical dependency. Professional evaluation is suggested. Document Released: 03/08/2001 Document Revised: 06/06/2011 Document Reviewed: 05/20/2010 [facility] Patient Information 2015 [place], [place]. This information is not intended to replace advice given to you by your health care provider. Make sure you discuss any questions you have with your health care provider.

## 2014-07-23 NOTE — ED Notes (Signed)
Patient belongings removed from locker 36 to locker 29
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
## 2014-07-23 NOTE — ED Notes (Signed)
Belongings bag and pt wanded with Clinical biochemist by Animal nutritionist.

## 2014-07-23 NOTE — ED Notes (Signed)
Pt wandering in hall

## 2014-10-16 ENCOUNTER — Encounter (HOSPITAL_COMMUNITY): Payer: Self-pay | Admitting: Emergency Medicine

## 2014-10-16 ENCOUNTER — Emergency Department (HOSPITAL_COMMUNITY)
Admission: EM | Admit: 2014-10-16 | Discharge: 2014-10-18 | Disposition: A | Discharge status: Home / Self Care | Payer: 59 | Attending: Emergency Medicine | Admitting: Emergency Medicine

## 2014-10-16 DIAGNOSIS — F259 Schizoaffective disorder, unspecified: Secondary | ICD-10-CM | POA: Insufficient documentation

## 2014-10-16 DIAGNOSIS — F29 Unspecified psychosis not due to a substance or known physiological condition: Secondary | ICD-10-CM

## 2014-10-16 DIAGNOSIS — F419 Anxiety disorder, unspecified: Secondary | ICD-10-CM | POA: Insufficient documentation

## 2014-10-16 DIAGNOSIS — F250 Schizoaffective disorder, bipolar type: Secondary | ICD-10-CM | POA: Diagnosis present

## 2014-10-16 DIAGNOSIS — Z720 Tobacco use: Secondary | ICD-10-CM | POA: Insufficient documentation

## 2014-10-16 HISTORY — DX: Schizoaffective disorder, unspecified: F25.9

## 2014-10-16 HISTORY — DX: Unspecified psychosis not due to a substance or known physiological condition: F29

## 2014-10-16 HISTORY — DX: Other psychoactive substance abuse, uncomplicated: F19.10

## 2014-10-16 LAB — COMPREHENSIVE METABOLIC PANEL
ALT: 23 U/L (ref 17–63)
AST: 32 U/L (ref 15–41)
Albumin: 3.6 g/dL (ref 3.5–5.0)
Alkaline Phosphatase: 92 U/L (ref 38–126)
Anion gap: 7 (ref 5–15)
BUN: 21 mg/dL — ABNORMAL HIGH (ref 6–20)
CO2: 24 mmol/L (ref 22–32)
Calcium: 8.8 mg/dL — ABNORMAL LOW (ref 8.9–10.3)
Chloride: 110 mmol/L (ref 101–111)
Creatinine, Ser: 1.08 mg/dL (ref 0.61–1.24)
GFR calc Af Amer: >60 mL/min (ref >60)
GFR calc non Af Amer: >60 mL/min (ref >60)
Glucose, Bld: 93 mg/dL (ref 65–99)
Potassium: 3.8 mmol/L (ref 3.5–5.1)
Sodium: 141 mmol/L (ref 135–145)
Total Bilirubin: 0.6 mg/dL (ref 0.3–1.2)
Total Protein: 7.5 g/dL (ref 6.5–8.1)

## 2014-10-16 LAB — ETHANOL: Alcohol, Ethyl (B): <5 mg/dL

## 2014-10-16 LAB — CBC
HCT: 35 % — ABNORMAL LOW (ref 39.0–52.0)
Hemoglobin: 11.8 g/dL — ABNORMAL LOW (ref 13.0–17.0)
MCH: 30.5 pg (ref 26.0–34.0)
MCHC: 33.7 g/dL (ref 30.0–36.0)
MCV: 90.4 fL (ref 78.0–100.0)
Platelets: 311 10*3/uL (ref 150–400)
RBC: 3.87 MIL/uL — ABNORMAL LOW (ref 4.22–5.81)
RDW: 13.8 % (ref 11.5–15.5)
WBC: 6.5 10*3/uL (ref 4.0–10.5)

## 2014-10-16 LAB — SALICYLATE LEVEL: Salicylate Lvl: <4 mg/dL (ref 2.8–30.0)

## 2014-10-16 LAB — ACETAMINOPHEN LEVEL: Acetaminophen (Tylenol), Serum: <10 ug/mL — ABNORMAL LOW (ref 10–30)

## 2014-10-16 MED ORDER — FLUOXETINE HCL 20 MG PO CAPS
20.0000 mg | ORAL_CAPSULE | Freq: Every day | ORAL | Status: DC
  Administered 2014-10-17 – 2014-10-18 (×2): 20 mg via ORAL
  Filled 2014-10-16 (×2): qty 1

## 2014-10-16 MED ORDER — ADULT MULTIVITAMIN W/MINERALS CH
1.0000 | ORAL_TABLET | Freq: Every day | ORAL | Status: DC
  Administered 2014-10-17 – 2014-10-18 (×2): 1 via ORAL
  Filled 2014-10-16 (×2): qty 1

## 2014-10-16 MED ORDER — LORAZEPAM 0.5 MG PO TABS
1.0000 mg | ORAL_TABLET | Freq: Three times a day (TID) | ORAL | Status: DC | PRN
  Administered 2014-10-17 – 2014-10-18 (×2): 1 mg via ORAL
  Filled 2014-10-16: qty 2
  Filled 2014-10-16: qty 1

## 2014-10-16 MED ORDER — BENZTROPINE MESYLATE 1 MG PO TABS
0.5000 mg | ORAL_TABLET | Freq: Two times a day (BID) | ORAL | Status: DC
  Administered 2014-10-17 – 2014-10-18 (×4): 0.5 mg via ORAL
  Filled 2014-10-16 (×4): qty 1

## 2014-10-16 MED ORDER — LISINOPRIL 10 MG PO TABS
10.0000 mg | ORAL_TABLET | Freq: Every day | ORAL | Status: DC
  Administered 2014-10-17 – 2014-10-18 (×2): 10 mg via ORAL
  Filled 2014-10-16 (×2): qty 1

## 2014-10-16 MED ORDER — TRAZODONE HCL 100 MG PO TABS
100.0000 mg | ORAL_TABLET | Freq: Every day | ORAL | Status: DC
  Administered 2014-10-17 (×2): 100 mg via ORAL
  Filled 2014-10-16 (×2): qty 1

## 2014-10-16 MED ORDER — HALOPERIDOL 5 MG PO TABS
5.0000 mg | ORAL_TABLET | Freq: Two times a day (BID) | ORAL | Status: DC
  Administered 2014-10-17 – 2014-10-18 (×4): 5 mg via ORAL
  Filled 2014-10-16 (×4): qty 1

## 2014-10-16 MED ORDER — LORAZEPAM 1 MG PO TABS
1.0000 mg | ORAL_TABLET | Freq: Once | ORAL | Status: AC
  Administered 2014-10-16: 1 mg via ORAL
  Filled 2014-10-16: qty 1

## 2014-10-16 NOTE — ED Notes (Signed)
Telepsych in progress. 

## 2014-10-16 NOTE — ED Provider Notes (Signed)
CSN: [id number]     Arrival date & time 10/16/14  1847 History   First MD Initiated Contact with Patient 10/16/14 1904     Chief Complaint  Patient presents with  . Psychotic       The history is provided by the EMS personnel and the patient. The history is limited by the condition of the patient (Psychosis).  Pt was seen at [facility]. Per EMS and pt report: Pt was found running around outside naked this evening and police were called. Pt states he has not taken his meds for an unknown period of time. Denies SI, no HI, no hallucinations.   Past Medical History  Diagnosis Date  . Anxiety   . Schizoaffective disorder   . Polysubstance abuse     cocaine, etoh, marijuana  . Psychosis    Past Surgical History  Procedure Laterality Date  . Hernia repair      History  Substance Use Topics  . Smoking status: Current Every Day Smoker -- 0.50 packs/day for 4 years  . Smokeless tobacco: Not on file  . Alcohol Use: Yes     Comment: Once every 3 months or so.     Review of Systems  Unable to perform ROS: Psychiatric disorder      Allergies  Review of patient's allergies indicates no known allergies.  Home Medications   Prior to Admission medications   Medication Sig Start Date End Date Taking? Authorizing Provider  benztropine (COGENTIN) 0.5 MG tablet Take 1 tablet (0.5 mg total) by mouth 2 (two) times daily. Patient not taking: Reported on 10/16/2014 06/14/13   Niel Hummer, NP  FLUoxetine (PROZAC) 20 MG capsule Take 1 capsule (20 mg total) by mouth daily. Patient not taking: Reported on 10/16/2014 06/14/13   Niel Hummer, NP  haloperidol (HALDOL) 5 MG tablet Take 1 tablet (5 mg total) by mouth 2 (two) times daily. Patient not taking: Reported on 10/16/2014 06/14/13   Niel Hummer, NP  lisinopril (PRINIVIL,ZESTRIL) 10 MG tablet Take 1 tablet (10 mg total) by mouth daily. Patient not taking: Reported on 10/16/2014 06/14/13   Niel Hummer, NP  Multiple Vitamin (MULTIVITAMIN WITH  MINERALS) TABS tablet Take 1 tablet by mouth daily. May purchase over the counter. Patient not taking: Reported on 10/16/2014 06/14/13   Niel Hummer, NP  traZODone (DESYREL) 100 MG tablet Take 1 tablet (100 mg total) by mouth at bedtime. Patient not taking: Reported on 10/16/2014 06/14/13   Niel Hummer, NP   BP 145/88 mmHg  Pulse 97  Temp(Src) 99.7 F (37.6 C) (Oral)  Resp 20  SpO2 97% Physical Exam  1935: Physical examination:  Nursing notes reviewed; Vital signs and O2 SAT reviewed;  Constitutional: Well developed, Well nourished, Well hydrated, In no acute distress; Head:  Normocephalic, atraumatic; Eyes: EOMI, PERRL, No scleral icterus; ENMT: Mouth and pharynx normal, Mucous membranes moist. Mouth and pharynx without lesions. No intra-oral or intra-nasal bleeding. Widespread dental decay/poor dentition. No tonsillar exudates. No intra-oral edema. No submandibular or sublingual edema. No hoarse voice, no drooling, no stridor. No trismus.; Neck: Supple, Full range of motion, No lymphadenopathy; Cardiovascular: Regular rate and rhythm, No gallop; Respiratory: Breath sounds clear & equal bilaterally, No rales, rhonchi, wheezes.  Speaking full sentences with ease, Normal respiratory effort/excursion; Chest: Nontender, Movement normal; Abdomen: Soft, Nontender, Nondistended, Normal bowel sounds; Genitourinary: No CVA tenderness; Extremities: Pulses normal, No tenderness, No edema, No calf edema or asymmetry.; Neuro: Awake, alert. No facial droop. Speech  clear. Moves all extremities spontaneously and to command.; Skin: Color normal, Warm, Dry.; Psych:  Rambling, pressured speech. Flight of ideas. Easily agitated, but will calm when redirected.     ED Course  Procedures      EKG Interpretation None      MDM  MDM Reviewed: previous chart, nursing note and vitals Reviewed previous: labs Interpretation: labs      Results for orders placed or performed during the hospital encounter of  10/16/14  Comprehensive metabolic panel  Result Value Ref Range   Sodium 141 135 - 145 mmol/L   Potassium 3.8 3.5 - 5.1 mmol/L   Chloride 110 101 - 111 mmol/L   CO2 24 22 - 32 mmol/L   Glucose, Bld 93 65 - 99 mg/dL   BUN 21 (H) 6 - 20 mg/dL   Creatinine, Ser 1.08 0.61 - 1.24 mg/dL   Calcium 8.8 (L) 8.9 - 10.3 mg/dL   Total Protein 7.5 6.5 - 8.1 g/dL   Albumin 3.6 3.5 - 5.0 g/dL   AST 32 15 - 41 U/L   ALT 23 17 - 63 U/L   Alkaline Phosphatase 92 38 - 126 U/L   Total Bilirubin 0.6 0.3 - 1.2 mg/dL   GFR calc non Af Amer >60 >60 mL/min   GFR calc Af Amer >60 >60 mL/min   Anion gap 7 5 - 15  Ethanol (ETOH)  Result Value Ref Range   Alcohol, Ethyl (B) <5 <5 mg/dL  Salicylate level  Result Value Ref Range   Salicylate Lvl <0.0 2.8 - 30.0 mg/dL  Acetaminophen level  Result Value Ref Range   Acetaminophen (Tylenol), Serum <10 (L) 10 - 30 ug/mL  CBC  Result Value Ref Range   WBC 6.5 4.0 - 10.5 K/uL   RBC 3.87 (L) 4.22 - 5.81 MIL/uL   Hemoglobin 11.8 (L) 13.0 - 17.0 g/dL   HCT 35.0 (L) 39.0 - 52.0 %   MCV 90.4 78.0 - 100.0 fL   MCH 30.5 26.0 - 34.0 pg   MCHC 33.7 30.0 - 36.0 g/dL   RDW 13.8 11.5 - 15.5 %   Platelets 311 150 - 400 K/uL     2100:  Pt became increasingly agitated shortly after arrival; PO ativan given. Pt will occasionally become agitated, but does calm when redirected. TTS has evaluated pt: recommends inpt treatment. Placement pending. Holding orders written.     Francine Graven, DO 10/16/14 2353

## 2014-10-16 NOTE — ED Notes (Signed)
Per EMS. Pt was found running around outside naked this evening. Pt has hx of schizophrenia, has not taken meds today. [employer] arrived and calmed down pt. Pt was clothed by the time EMS arrived. Pt has various complaints. Pt told EMS he has a laceration in his throat, some blood noted in pt's mouth by paramedic. No obvious bleeding upon EMS arrival to ED. Also complains of L big toe pain, R foot numbness, pain from healed R hand puncture and pain from a healed laceration on the top of his head. Pt has perseverative speech upon EMS arrival and keeps nodding and making motions in bed. Denies SI/HI or substance abuse.

## 2014-10-16 NOTE — ED Notes (Signed)
Bed: WA09 Expected date:  Expected time:  Means of arrival:  Comments: Ems- laceration, hx of schizophrenia

## 2014-10-16 NOTE — ED Notes (Addendum)
PT has two bags of belongings, placed in locker 29.  Wanded by security.

## 2014-10-16 NOTE — ED Notes (Addendum)
2 pt belonging bags moved from locker #29 to locker #32.
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
## 2014-10-16 NOTE — ED Notes (Signed)
Asked MD Thurnell Garbe if we could transfer pt to TCU or Psych hold.  She reply's that is fine.

## 2014-10-17 DIAGNOSIS — F250 Schizoaffective disorder, bipolar type: Secondary | ICD-10-CM

## 2014-10-17 DIAGNOSIS — F29 Unspecified psychosis not due to a substance or known physiological condition: Secondary | ICD-10-CM | POA: Insufficient documentation

## 2014-10-17 MED ORDER — OLANZAPINE 5 MG PO TBDP
5.0000 mg | ORAL_TABLET | Freq: Three times a day (TID) | ORAL | Status: DC | PRN
  Administered 2014-10-18: 5 mg via ORAL
  Filled 2014-10-17: qty 1

## 2014-10-17 NOTE — ED Notes (Signed)
Pt resting in bed, irritable and agitated. Awake, alert & responsive, no distress noted.  Monitoring for safety, Q 15 min checks in effect.

## 2014-10-17 NOTE — ED Notes (Signed)
Bed: [facility] Expected date: 10/17/14 Expected time:  Means of arrival:  Comments: Room 32

## 2014-10-17 NOTE — Consult Note (Signed)
[facility] Consult   Reason for Consult:  Schizoaffective disorder, Bipolar type Referring Physician:  EDP Patient Identification: Keith Murray MRN:  [id number] Principal Diagnosis: Schizoaffective disorder, bipolar type Diagnosis:   Patient Active Problem List   Diagnosis Date Noted  . Schizoaffective disorder, bipolar type [F25.0]     Priority: High  . Alcohol dependence [F10.20] 06/08/2013  . Schizoaffective disorder [F25.9] 08/08/2012  . Polysubstance (excluding opioids) dependence [F19.20] 08/08/2012  . Psychosis [F29] 08/04/2012    Total Time spent with patient: 1 hour  Subjective:   Keith Murray is a 54 y.o. male patient admitted with Schizoaffective disorder, Bipolar type.  HPI:  AA male 54 years old was evaluated for exacerbation of Bipolar disorder.  Patient was found running around  Naked yesterday evening and was brought in by EMS.  Patient, today does not remember he was found naked.  Patient reports that he became angry when his niece who promised to take him to the Pharmacy to refill his medication did not show up.  Patient states he has not taken his medications for 2 weeks and his last hospitalization was a year ago at our [facility].  He denies any drug use and denies SI/HI/AVH.  It was documented yesterday that patient had marks around his neck and when questioned about it could not explain how he got the marks.  Patient was angry and irritable during the assessment.  Patient has been accepted for admission for stabilization and we will be seeking placement.  HPI Elements:   Location:  Schizoaffective disorder, Bipolar type, Major depressive disorder, recurrent. Quality:  severe. Severity:  severe. Timing:  Acute. Duration:  Chronic mental illness.. Context:  Brought in by EMS AFTER FOUND NAKED ON THE STREET.  Past Medical History:  Past Medical History  Diagnosis Date  . Anxiety   . Schizoaffective disorder   . Polysubstance abuse     cocaine, etoh,  marijuana  . Psychosis     Past Surgical History  Procedure Laterality Date  . Hernia repair     Family History: History reviewed. No pertinent family history. Social History:  History  Alcohol Use  . Yes    Comment: Once every 3 months or so.      History  Drug Use  . Yes  . Special: Cocaine, Marijuana    Comment: THC about once per week and cocaine on occasion.     History   Social History  . Marital Status: Single    Spouse Name: N/A  . Number of Children: N/A  . Years of Education: N/A   Social History Main Topics  . Smoking status: Current Every Day Smoker -- 0.50 packs/day for 4 years  . Smokeless tobacco: Not on file  . Alcohol Use: Yes     Comment: Once every 3 months or so.   . Drug Use: Yes    Special: Cocaine, Marijuana     Comment: THC about once per week and cocaine on occasion.   . Sexual Activity: Yes    Birth Control/ Protection: Condom   Other Topics Concern  . None   Social History Narrative   Additional Social History:    Prescriptions: See PTA list History of alcohol / drug use?:  (Occasional use per pt)                     Allergies:  No Known Allergies  Labs:  Results for orders placed or performed during the hospital encounter of 10/16/14 (  from the past 48 hour(s))  Comprehensive metabolic panel     Status: Abnormal   Collection Time: 10/16/14  7:22 PM  Result Value Ref Range   Sodium 141 135 - 145 mmol/L   Potassium 3.8 3.5 - 5.1 mmol/L   Chloride 110 101 - 111 mmol/L   CO2 24 22 - 32 mmol/L   Glucose, Bld 93 65 - 99 mg/dL   BUN 21 (H) 6 - 20 mg/dL   Creatinine, Ser 1.08 0.61 - 1.24 mg/dL   Calcium 8.8 (L) 8.9 - 10.3 mg/dL   Total Protein 7.5 6.5 - 8.1 g/dL   Albumin 3.6 3.5 - 5.0 g/dL   AST 32 15 - 41 U/L   ALT 23 17 - 63 U/L   Alkaline Phosphatase 92 38 - 126 U/L   Total Bilirubin 0.6 0.3 - 1.2 mg/dL   GFR calc non Af Amer >60 >60 mL/min   GFR calc Af Amer >60 >60 mL/min    Comment: (NOTE) The eGFR has been  calculated using the CKD EPI equation. This calculation has not been validated in all clinical situations. eGFR's persistently <60 mL/min signify possible Chronic Kidney Disease.    Anion gap 7 5 - 15  Ethanol (ETOH)     Status: None   Collection Time: 10/16/14  7:22 PM  Result Value Ref Range   Alcohol, Ethyl (B) <5 <5 mg/dL    Comment:        LOWEST DETECTABLE LIMIT FOR SERUM ALCOHOL IS 5 mg/dL FOR MEDICAL PURPOSES ONLY   Salicylate level     Status: None   Collection Time: 10/16/14  7:22 PM  Result Value Ref Range   Salicylate Lvl <8.6 2.8 - 30.0 mg/dL  Acetaminophen level     Status: Abnormal   Collection Time: 10/16/14  7:22 PM  Result Value Ref Range   Acetaminophen (Tylenol), Serum <10 (L) 10 - 30 ug/mL    Comment:        THERAPEUTIC CONCENTRATIONS VARY SIGNIFICANTLY. A RANGE OF 10-30 ug/mL MAY BE AN EFFECTIVE CONCENTRATION FOR MANY PATIENTS. HOWEVER, SOME ARE BEST TREATED AT CONCENTRATIONS OUTSIDE THIS RANGE. ACETAMINOPHEN CONCENTRATIONS >150 ug/mL AT 4 HOURS AFTER INGESTION AND >50 ug/mL AT 12 HOURS AFTER INGESTION ARE OFTEN ASSOCIATED WITH TOXIC REACTIONS.   CBC     Status: Abnormal   Collection Time: 10/16/14  7:22 PM  Result Value Ref Range   WBC 6.5 4.0 - 10.5 K/uL   RBC 3.87 (L) 4.22 - 5.81 MIL/uL   Hemoglobin 11.8 (L) 13.0 - 17.0 g/dL   HCT 35.0 (L) 39.0 - 52.0 %   MCV 90.4 78.0 - 100.0 fL   MCH 30.5 26.0 - 34.0 pg   MCHC 33.7 30.0 - 36.0 g/dL   RDW 13.8 11.5 - 15.5 %   Platelets 311 150 - 400 K/uL    Vitals: Blood pressure 137/61, pulse 61, temperature 98 F (36.7 C), temperature source Oral, resp. rate 18, SpO2 100 %.  Risk to Self: Suicidal Ideation: No (denied strongly) Suicidal Intent: No (denies) Is patient at risk for suicide?: No (no given all available information) Suicidal Plan?: No (denies) Access to Means: No (pt sts he has a gun but it is in [place]) What has been your use of drugs/alcohol within the last 12 months?: Occasional per pt  (Hx sts hx of polysubstance abuse) How many times?:  (na) Other Self Harm Risks:  (denies) Triggers for Past Attempts: Unknown Intentional Self Injurious Behavior: None (per pt although  pt had a throat laceration today w no explan) Risk to Others: Homicidal Ideation: No (denies) Thoughts of Harm to Others: No (denies) Current Homicidal Intent: No (denies) Current Homicidal Plan: No (denies) Access to Homicidal Means: No (gun in [place] per pt) Identified Victim: na History of harm to others?: No (denies) Assessment of Violence: None Noted Violent Behavior Description: na Does patient have access to weapons?: No (gun in [place]) Criminal Charges Pending?: No (denies) Does patient have a court date: No (denies) Prior Inpatient Therapy: Prior Inpatient Therapy: Yes Prior Therapy Dates: April 2016, 2015, 2014, 2012 Prior Therapy Facilty/Provider(s): [facility] Reason for Treatment: Schizoaffective D/O, Substance Use Prior Outpatient Therapy: Prior Outpatient Therapy:  (Unknown) Prior Therapy Dates: na Prior Therapy Facilty/Provider(s): na Reason for Treatment: na Does patient have an ACCT team?: No (Pt sts no) Does patient have Intensive In-House Services?  : No Does patient have [facility] services? : No Does patient have P4[facility] services?: No  Current Facility-Administered Medications  Medication Dose Route Frequency Provider Last Rate Last Dose  . benztropine (COGENTIN) tablet 0.5 mg  0.5 mg Oral BID Francine Graven, DO   0.5 mg at 10/17/14 3704  . FLUoxetine (PROZAC) capsule 20 mg  20 mg Oral Daily Francine Graven, DO   20 mg at 10/17/14 8889  . haloperidol (HALDOL) tablet 5 mg  5 mg Oral BID Francine Graven, DO   5 mg at 10/17/14 1694  . lisinopril (PRINIVIL,ZESTRIL) tablet 10 mg  10 mg Oral Daily Francine Graven, DO   10 mg at 10/17/14 5038  . LORazepam (ATIVAN) tablet 1 mg  1 mg Oral Q8H PRN Francine Graven, DO   1 mg at 10/17/14 8828  . multivitamin with minerals tablet 1 tablet  1 tablet  Oral Daily Francine Graven, DO   1 tablet at 10/17/14 [phone]  . OLANZapine zydis (ZYPREXA) disintegrating tablet 5 mg  5 mg Oral Q8H PRN Ioanna Colquhoun      . traZODone (DESYREL) tablet 100 mg  100 mg Oral QHS Francine Graven, DO   100 mg at 10/17/14 0036   Current Outpatient Prescriptions  Medication Sig Dispense Refill  . benztropine (COGENTIN) 0.5 MG tablet Take 1 tablet (0.5 mg total) by mouth 2 (two) times daily. (Patient not taking: Reported on 10/16/2014) 60 tablet 0  . FLUoxetine (PROZAC) 20 MG capsule Take 1 capsule (20 mg total) by mouth daily. (Patient not taking: Reported on 10/16/2014) 30 capsule 0  . haloperidol (HALDOL) 5 MG tablet Take 1 tablet (5 mg total) by mouth 2 (two) times daily. (Patient not taking: Reported on 10/16/2014) 60 tablet 0  . lisinopril (PRINIVIL,ZESTRIL) 10 MG tablet Take 1 tablet (10 mg total) by mouth daily. (Patient not taking: Reported on 10/16/2014) 30 tablet 0  . Multiple Vitamin (MULTIVITAMIN WITH MINERALS) TABS tablet Take 1 tablet by mouth daily. May purchase over the counter. (Patient not taking: Reported on 10/16/2014)    . traZODone (DESYREL) 100 MG tablet Take 1 tablet (100 mg total) by mouth at bedtime. (Patient not taking: Reported on 10/16/2014) 30 tablet 0    Musculoskeletal: Strength & Muscle Tone: within normal limits Gait & Station: normal Patient leans: N/A  Psychiatric Specialty Exam: Physical Exam  Review of Systems  Constitutional: Negative.   Eyes: Negative.   Respiratory: Negative.   Cardiovascular: Negative.   Gastrointestinal: Negative.   Genitourinary: Negative.   Musculoskeletal: Negative.   Skin: Negative.   Neurological: Negative.   Endo/Heme/Allergies: Negative.     Blood pressure 137/61,  pulse 61, temperature 98 F (36.7 C), temperature source Oral, resp. rate 18, SpO2 100 %.There is no weight on file to calculate BMI.  General Appearance: Casual  Eye Contact::  Minimal  Speech:  Clear and Coherent and Normal Rate   Volume:  Normal  Mood:  Angry, Anxious and Depressed  Affect:  Congruent, Depressed and Flat  Thought Process:  Coherent, Goal Directed and Intact  Orientation:  Full (Time, Place, and Person)  Thought Content:  WDL  Suicidal Thoughts:  No  Homicidal Thoughts:  No  Memory:  Immediate;   Good Recent;   Good Remote;   Good  Judgement:  Poor  Insight:  Good  Psychomotor Activity:  Psychomotor Retardation  Concentration:  Good  Recall:  NA  Fund of Knowledge:Good  Language: Good  Akathisia:  NA  Handed:  Right  AIMS (if indicated):     Assets:  Desire for Improvement  ADL's:  Intact  Cognition: WNL  Sleep:      Medical Decision Making: Review of Psycho-Social Stressors (1), Established Problem, Worsening (2), Review of Medication Regimen & Side Effects (2) and Review of New Medication or Change in Dosage (2)  Treatment Plan Summary: Daily contact with patient to assess and evaluate symptoms and progress in treatment and Medication management  Plan:  Resume all home medications. Disposition: Admit and seek placement  Delfin Gant   PMHNP-BC 10/17/2014 3:35 PM Patient seen face-to-face for psychiatric evaluation, chart reviewed and case discussed with the physician extender and developed treatment plan. Reviewed the information documented and agree with the treatment plan. Corena Pilgrim, MD

## 2014-10-18 DIAGNOSIS — F250 Schizoaffective disorder, bipolar type: Secondary | ICD-10-CM | POA: Diagnosis not present

## 2014-10-18 MED ORDER — TRAZODONE HCL 100 MG PO TABS: 100.0000 mg | ORAL_TABLET | Freq: Every day | ORAL | Status: DC

## 2014-10-18 MED ORDER — HALOPERIDOL 5 MG PO TABS: 5.0000 mg | ORAL_TABLET | Freq: Two times a day (BID) | ORAL | Status: DC

## 2014-10-18 MED ORDER — FLUOXETINE HCL 20 MG PO CAPS: 20.0000 mg | ORAL_CAPSULE | Freq: Every day | ORAL | Status: DC

## 2014-10-18 MED ORDER — BENZTROPINE MESYLATE 0.5 MG PO TABS: 0.5000 mg | ORAL_TABLET | Freq: Two times a day (BID) | ORAL | Status: DC

## 2014-10-18 NOTE — Progress Notes (Signed)
Outpatient resources provided to patient by Probation officer. Patient verbalized no additional concerns at this time.   Boyce Medici. MSW, LCSW Therapeutic Triage [facility] Specialist

## 2014-10-18 NOTE — [facility] Suicide Risk Assessment (Cosign Needed)
Suicide Risk Assessment  Discharge Assessment   [facility] Discharge Suicide Risk Assessment   Demographic Factors:  Male, Low socioeconomic status, Living alone and Unemployed  Total Time spent with patient: 20 minutes  Musculoskeletal: Strength & Muscle Tone: within normal limits Gait & Station: normal Patient leans: N/A  Psychiatric Specialty Exam:     Blood pressure 116/73, pulse 66, temperature 97.8 F (36.6 C), temperature source Oral, resp. rate 18, SpO2 100 %.There is no weight on file to calculate BMI.  General Appearance: Casual  Eye Contact::  Minimal  Speech:  Clear and Coherent and Normal Rate  Volume:  Normal  Mood:  Depressed  Affect:  Congruent, Depressed and Flat  Thought Process:  Coherent, Goal Directed and Intact  Orientation:  Full (Time, Place, and Person)  Thought Content:  WDL  Suicidal Thoughts:  No  Homicidal Thoughts:  No  Memory:  Immediate;   Good Recent;   Good Remote;   Good  Judgement:  Poor  Insight:  Good  Psychomotor Activity:  Normal  Concentration:  Good  Recall:  NA  Fund of Knowledge:Good  Language: Good  Akathisia:  NA  Handed:  Right  AIMS (if indicated):     Assets:  Desire for Improvement  ADL's:  Intact  Cognition: WNL  Sleep:           Has this patient used any form of tobacco in the last 30 days? (Cigarettes, Smokeless Tobacco, Cigars, and/or Pipes) Yes, A prescription for an FDA-approved tobacco cessation medication was offered at discharge and the patient refused  Mental Status Per Nursing Assessment::   On Admission:     Current Mental Status by Physician: NA  Loss Factors: NA  Historical Factors: NA  Risk Reduction Factors:   Religious beliefs about death and Positive social support  Continued Clinical Symptoms:  More than one psychiatric diagnosis Previous Psychiatric Diagnoses and Treatments  Cognitive Features That Contribute To Risk:  Polarized thinking    Suicide Risk:  Minimal: No identifiable  suicidal ideation.  Patients presenting with no risk factors but with morbid ruminations; may be classified as minimal risk based on the severity of the depressive symptoms  Principal Problem: Schizoaffective disorder, bipolar type Discharge Diagnoses:  Patient Active Problem List   Diagnosis Date Noted  . Schizoaffective disorder, bipolar type [F25.0]     Priority: High  . Psychoses [F29]   . Alcohol dependence [F10.20] 06/08/2013  . Schizoaffective disorder [F25.9] 08/08/2012  . Polysubstance (excluding opioids) dependence [F19.20] 08/08/2012  . Psychosis [F29] 08/04/2012      Plan Of Care/Follow-up recommendations:  Activity:  as tolerated Diet:  regular  Is patient on multiple antipsychotic therapies at discharge:  No   Has Patient had three or more failed trials of antipsychotic monotherapy by history:  No  Recommended Plan for Multiple Antipsychotic Therapies: NA    Sharmeka Palmisano C   PMHNP-BC 10/18/2014, 4:35 PM

## 2014-10-18 NOTE — Consult Note (Signed)
[facility] Consult   Reason for Consult:  Schizoaffective disorder, Bipolar type Referring Physician:  EDP Patient Identification: Josimar Corning MRN:  [id number] Principal Diagnosis: Schizoaffective disorder, bipolar type Diagnosis:   Patient Active Problem List   Diagnosis Date Noted  . Schizoaffective disorder, bipolar type [F25.0]     Priority: High  . Psychoses [F29]   . Alcohol dependence [F10.20] 06/08/2013  . Schizoaffective disorder [F25.9] 08/08/2012  . Polysubstance (excluding opioids) dependence [F19.20] 08/08/2012  . Psychosis [F29] 08/04/2012    Total Time spent with patient: 20 minutes  Subjective:   Lyric Hoar is a 54 y.o. male patient admitted with Schizoaffective disorder, Bipolar type.  HPI:  AA male 54 years old was evaluated for exacerbation of Bipolar disorder.  Patient was found running around  Naked yesterday evening and was brought in by EMS.  Patient, today does not remember he was found naked.  Patient reports that he became angry when his niece who promised to take him to the Pharmacy to refill his medication did not show up.  Patient states he has not taken his medications for 2 weeks and his last hospitalization was a year ago at our [facility].  He denies any drug use and denies SI/HI/AVH.  It was documented yesterday that patient had marks around his neck and when questioned about it could not explain how he got the marks.  Patient was angry and irritable during the assessment.  Patient has been accepted for admission for stabilization and we will be seeking placement.  Today patient remained calm and cooperative but angry at hie cousin for not taking him to the doctor.  He vehemently denied been found naked by the Police.   Patient has made alternative arrangement and will be going to his doctor on Monday.  Patient denies SI/HI/AVH.  Patient is discharge home now and he will follow up with [facility].  HPI Elements:   Location:   Schizoaffective disorder, Bipolar type, Major depressive disorder, recurrent. Quality:  severe. Severity:  severe. Timing:  Acute. Duration:  Chronic mental illness.. Context:  Brought in by EMS AFTER FOUND NAKED ON THE STREET.  Past Medical History:  Past Medical History  Diagnosis Date  . Anxiety   . Schizoaffective disorder   . Polysubstance abuse     cocaine, etoh, marijuana  . Psychosis     Past Surgical History  Procedure Laterality Date  . Hernia repair     Family History: History reviewed. No pertinent family history. Social History:  History  Alcohol Use  . Yes    Comment: Once every 3 months or so.      History  Drug Use  . Yes  . Special: Cocaine, Marijuana    Comment: THC about once per week and cocaine on occasion.     History   Social History  . Marital Status: Single    Spouse Name: N/A  . Number of Children: N/A  . Years of Education: N/A   Social History Main Topics  . Smoking status: Current Every Day Smoker -- 0.50 packs/day for 4 years  . Smokeless tobacco: Not on file  . Alcohol Use: Yes     Comment: Once every 3 months or so.   . Drug Use: Yes    Special: Cocaine, Marijuana     Comment: THC about once per week and cocaine on occasion.   . Sexual Activity: Yes    Birth Control/ Protection: Condom   Other Topics Concern  .  None   Social History Narrative   Additional Social History:    Prescriptions: See PTA list History of alcohol / drug use?:  (Occasional use per pt)                     Allergies:  No Known Allergies  Labs:  Results for orders placed or performed during the hospital encounter of 10/16/14 (from the past 48 hour(s))  Comprehensive metabolic panel     Status: Abnormal   Collection Time: 10/16/14  7:22 PM  Result Value Ref Range   Sodium 141 135 - 145 mmol/L   Potassium 3.8 3.5 - 5.1 mmol/L   Chloride 110 101 - 111 mmol/L   CO2 24 22 - 32 mmol/L   Glucose, Bld 93 65 - 99 mg/dL   BUN 21 (H) 6 - 20  mg/dL   Creatinine, Ser 1.08 0.61 - 1.24 mg/dL   Calcium 8.8 (L) 8.9 - 10.3 mg/dL   Total Protein 7.5 6.5 - 8.1 g/dL   Albumin 3.6 3.5 - 5.0 g/dL   AST 32 15 - 41 U/L   ALT 23 17 - 63 U/L   Alkaline Phosphatase 92 38 - 126 U/L   Total Bilirubin 0.6 0.3 - 1.2 mg/dL   GFR calc non Af Amer >60 >60 mL/min   GFR calc Af Amer >60 >60 mL/min    Comment: (NOTE) The eGFR has been calculated using the CKD EPI equation. This calculation has not been validated in all clinical situations. eGFR's persistently <60 mL/min signify possible Chronic Kidney Disease.    Anion gap 7 5 - 15  Ethanol (ETOH)     Status: None   Collection Time: 10/16/14  7:22 PM  Result Value Ref Range   Alcohol, Ethyl (B) <5 <5 mg/dL    Comment:        LOWEST DETECTABLE LIMIT FOR SERUM ALCOHOL IS 5 mg/dL FOR MEDICAL PURPOSES ONLY   Salicylate level     Status: None   Collection Time: 10/16/14  7:22 PM  Result Value Ref Range   Salicylate Lvl <9.3 2.8 - 30.0 mg/dL  Acetaminophen level     Status: Abnormal   Collection Time: 10/16/14  7:22 PM  Result Value Ref Range   Acetaminophen (Tylenol), Serum <10 (L) 10 - 30 ug/mL    Comment:        THERAPEUTIC CONCENTRATIONS VARY SIGNIFICANTLY. A RANGE OF 10-30 ug/mL MAY BE AN EFFECTIVE CONCENTRATION FOR MANY PATIENTS. HOWEVER, SOME ARE BEST TREATED AT CONCENTRATIONS OUTSIDE THIS RANGE. ACETAMINOPHEN CONCENTRATIONS >150 ug/mL AT 4 HOURS AFTER INGESTION AND >50 ug/mL AT 12 HOURS AFTER INGESTION ARE OFTEN ASSOCIATED WITH TOXIC REACTIONS.   CBC     Status: Abnormal   Collection Time: 10/16/14  7:22 PM  Result Value Ref Range   WBC 6.5 4.0 - 10.5 K/uL   RBC 3.87 (L) 4.22 - 5.81 MIL/uL   Hemoglobin 11.8 (L) 13.0 - 17.0 g/dL   HCT 35.0 (L) 39.0 - 52.0 %   MCV 90.4 78.0 - 100.0 fL   MCH 30.5 26.0 - 34.0 pg   MCHC 33.7 30.0 - 36.0 g/dL   RDW 13.8 11.5 - 15.5 %   Platelets 311 150 - 400 K/uL    Vitals: Blood pressure 116/73, pulse 66, temperature 97.8 F (36.6 C),  temperature source Oral, resp. rate 18, SpO2 100 %.  Risk to Self: Suicidal Ideation: No (denied strongly) Suicidal Intent: No (denies) Is patient at risk for suicide?: No (  no given all available information) Suicidal Plan?: No (denies) Access to Means: No (pt sts he has a gun but it is in [place]) What has been your use of drugs/alcohol within the last 12 months?: Occasional per pt (Hx sts hx of polysubstance abuse) How many times?:  (na) Other Self Harm Risks:  (denies) Triggers for Past Attempts: Unknown Intentional Self Injurious Behavior: None (per pt although pt had a throat laceration today w no explan) Risk to Others: Homicidal Ideation: No (denies) Thoughts of Harm to Others: No (denies) Current Homicidal Intent: No (denies) Current Homicidal Plan: No (denies) Access to Homicidal Means: No (gun in [place] per pt) Identified Victim: na History of harm to others?: No (denies) Assessment of Violence: None Noted Violent Behavior Description: na Does patient have access to weapons?: No (gun in [place]) Criminal Charges Pending?: No (denies) Does patient have a court date: No (denies) Prior Inpatient Therapy: Prior Inpatient Therapy: Yes Prior Therapy Dates: April 2016, 2015, 2014, 2012 Prior Therapy Facilty/Provider(s): [facility] Reason for Treatment: Schizoaffective D/O, Substance Use Prior Outpatient Therapy: Prior Outpatient Therapy:  (Unknown) Prior Therapy Dates: na Prior Therapy Facilty/Provider(s): na Reason for Treatment: na Does patient have an ACCT team?: No (Pt sts no) Does patient have Intensive In-House Services?  : No Does patient have [facility] services? : No Does patient have [facility] services?: No  Current Facility-Administered Medications  Medication Dose Route Frequency Provider Last Rate Last Dose  . benztropine (COGENTIN) tablet 0.5 mg  0.5 mg Oral BID Francine Graven, DO   0.5 mg at 10/18/14 1005  . FLUoxetine (PROZAC) capsule 20 mg  20 mg Oral Daily Francine Graven, DO    20 mg at 10/18/14 1006  . haloperidol (HALDOL) tablet 5 mg  5 mg Oral BID Francine Graven, DO   5 mg at 10/18/14 1006  . lisinopril (PRINIVIL,ZESTRIL) tablet 10 mg  10 mg Oral Daily Francine Graven, DO   10 mg at 10/18/14 1005  . LORazepam (ATIVAN) tablet 1 mg  1 mg Oral Q8H PRN Francine Graven, DO   1 mg at 10/18/14 0552  . multivitamin with minerals tablet 1 tablet  1 tablet Oral Daily Francine Graven, DO   1 tablet at 10/18/14 1005  . OLANZapine zydis (ZYPREXA) disintegrating tablet 5 mg  5 mg Oral Q8H PRN Mojeed Akintayo   5 mg at 10/18/14 0552  . traZODone (DESYREL) tablet 100 mg  100 mg Oral QHS Francine Graven, DO   100 mg at 10/17/14 2120   Current Outpatient Prescriptions  Medication Sig Dispense Refill  . benztropine (COGENTIN) 0.5 MG tablet Take 1 tablet (0.5 mg total) by mouth 2 (two) times daily. (Patient not taking: Reported on 10/16/2014) 60 tablet 0  . FLUoxetine (PROZAC) 20 MG capsule Take 1 capsule (20 mg total) by mouth daily. (Patient not taking: Reported on 10/16/2014) 30 capsule 0  . haloperidol (HALDOL) 5 MG tablet Take 1 tablet (5 mg total) by mouth 2 (two) times daily. (Patient not taking: Reported on 10/16/2014) 60 tablet 0  . lisinopril (PRINIVIL,ZESTRIL) 10 MG tablet Take 1 tablet (10 mg total) by mouth daily. (Patient not taking: Reported on 10/16/2014) 30 tablet 0  . Multiple Vitamin (MULTIVITAMIN WITH MINERALS) TABS tablet Take 1 tablet by mouth daily. May purchase over the counter. (Patient not taking: Reported on 10/16/2014)    . traZODone (DESYREL) 100 MG tablet Take 1 tablet (100 mg total) by mouth at bedtime. (Patient not taking: Reported on 10/16/2014) 30 tablet 0  Musculoskeletal: Strength & Muscle Tone: within normal limits Gait & Station: normal Patient leans: N/A  Psychiatric Specialty Exam: Physical Exam  Review of Systems  Constitutional: Negative.   Eyes: Negative.   Respiratory: Negative.   Cardiovascular: Negative.   Gastrointestinal:  Negative.   Genitourinary: Negative.   Musculoskeletal: Negative.   Skin: Negative.   Neurological: Negative.   Endo/Heme/Allergies: Negative.     Blood pressure 116/73, pulse 66, temperature 97.8 F (36.6 C), temperature source Oral, resp. rate 18, SpO2 100 %.There is no weight on file to calculate BMI.  General Appearance: Casual  Eye Contact::  Minimal  Speech:  Clear and Coherent and Normal Rate  Volume:  Normal  Mood:  Depressed  Affect:  Congruent, Depressed and Flat  Thought Process:  Coherent, Goal Directed and Intact  Orientation:  Full (Time, Place, and Person)  Thought Content:  WDL  Suicidal Thoughts:  No  Homicidal Thoughts:  No  Memory:  Immediate;   Good Recent;   Good Remote;   Good  Judgement:  Poor  Insight:  Good  Psychomotor Activity:  Normal  Concentration:  Good  Recall:  NA  Fund of Knowledge:Good  Language: Good  Akathisia:  NA  Handed:  Right  AIMS (if indicated):     Assets:  Desire for Improvement  ADL's:  Intact  Cognition: WNL  Sleep:      Medical Decision Making: Established Problem, Stable/Improving (1)    Disposition: Discharge home with outpatient medication management as scheduled  Delfin Gant   PMHNP-BC 10/18/2014 4:18 PM  Patient seen face-to-face for psychiatric evaluation along with psychiatric nurse practitioner and case discussed with the treatment team. Formulated treatment plan and reviewed the information documented and agree with the treatment plan.  Tommaso Cavitt,JANARDHAHA R. 10/19/2014 7:14 PM

## 2014-10-28 ENCOUNTER — Encounter (HOSPITAL_COMMUNITY): Payer: Self-pay | Admitting: Emergency Medicine

## 2014-10-28 ENCOUNTER — Emergency Department (HOSPITAL_COMMUNITY)
Admission: EM | Admit: 2014-10-28 | Discharge: 2014-10-29 | Disposition: A | Discharge status: Other Acute Inpatient Hospital | Payer: Medicare Other | Attending: Emergency Medicine | Admitting: Emergency Medicine

## 2014-10-28 DIAGNOSIS — F29 Unspecified psychosis not due to a substance or known physiological condition: Secondary | ICD-10-CM

## 2014-10-28 DIAGNOSIS — R05 Cough: Secondary | ICD-10-CM | POA: Insufficient documentation

## 2014-10-28 DIAGNOSIS — R509 Fever, unspecified: Secondary | ICD-10-CM | POA: Insufficient documentation

## 2014-10-28 DIAGNOSIS — F259 Schizoaffective disorder, unspecified: Secondary | ICD-10-CM | POA: Diagnosis not present

## 2014-10-28 DIAGNOSIS — R079 Chest pain, unspecified: Secondary | ICD-10-CM | POA: Insufficient documentation

## 2014-10-28 DIAGNOSIS — R51 Headache: Secondary | ICD-10-CM | POA: Insufficient documentation

## 2014-10-28 DIAGNOSIS — F1410 Cocaine abuse, uncomplicated: Secondary | ICD-10-CM | POA: Diagnosis not present

## 2014-10-28 DIAGNOSIS — R110 Nausea: Secondary | ICD-10-CM | POA: Insufficient documentation

## 2014-10-28 DIAGNOSIS — Z720 Tobacco use: Secondary | ICD-10-CM | POA: Insufficient documentation

## 2014-10-28 DIAGNOSIS — J029 Acute pharyngitis, unspecified: Secondary | ICD-10-CM | POA: Insufficient documentation

## 2014-10-28 DIAGNOSIS — R109 Unspecified abdominal pain: Secondary | ICD-10-CM | POA: Diagnosis not present

## 2014-10-28 DIAGNOSIS — F419 Anxiety disorder, unspecified: Secondary | ICD-10-CM | POA: Diagnosis not present

## 2014-10-28 DIAGNOSIS — Z79899 Other long term (current) drug therapy: Secondary | ICD-10-CM | POA: Insufficient documentation

## 2014-10-28 DIAGNOSIS — R456 Violent behavior: Secondary | ICD-10-CM | POA: Diagnosis present

## 2014-10-28 LAB — RAPID URINE DRUG SCREEN, HOSP PERFORMED
Amphetamines: NOT DETECTED
Barbiturates: NOT DETECTED
Benzodiazepines: NOT DETECTED
Cocaine: POSITIVE — AB
Opiates: NOT DETECTED
Tetrahydrocannabinol: NOT DETECTED

## 2014-10-28 LAB — COMPREHENSIVE METABOLIC PANEL
ALT: 18 U/L (ref 17–63)
AST: 19 U/L (ref 15–41)
Albumin: 3.8 g/dL (ref 3.5–5.0)
Alkaline Phosphatase: 96 U/L (ref 38–126)
Anion gap: 5 (ref 5–15)
BUN: 10 mg/dL (ref 6–20)
CO2: 22 mmol/L (ref 22–32)
Calcium: 8.5 mg/dL — ABNORMAL LOW (ref 8.9–10.3)
Chloride: 113 mmol/L — ABNORMAL HIGH (ref 101–111)
Creatinine, Ser: 0.87 mg/dL (ref 0.61–1.24)
GFR calc Af Amer: >60 mL/min (ref >60)
GFR calc non Af Amer: >60 mL/min (ref >60)
Glucose, Bld: 86 mg/dL (ref 65–99)
Potassium: 3.9 mmol/L (ref 3.5–5.1)
Sodium: 140 mmol/L (ref 135–145)
Total Bilirubin: 0.3 mg/dL (ref 0.3–1.2)
Total Protein: 7.2 g/dL (ref 6.5–8.1)

## 2014-10-28 LAB — ETHANOL: Alcohol, Ethyl (B): 141 mg/dL — ABNORMAL HIGH (ref <5)

## 2014-10-28 LAB — CBC
HCT: 38.3 % — ABNORMAL LOW (ref 39.0–52.0)
Hemoglobin: 12.9 g/dL — ABNORMAL LOW (ref 13.0–17.0)
MCH: 30.9 pg (ref 26.0–34.0)
MCHC: 33.7 g/dL (ref 30.0–36.0)
MCV: 91.6 fL (ref 78.0–100.0)
Platelets: 306 10*3/uL (ref 150–400)
RBC: 4.18 MIL/uL — ABNORMAL LOW (ref 4.22–5.81)
RDW: 14 % (ref 11.5–15.5)
WBC: 5.8 10*3/uL (ref 4.0–10.5)

## 2014-10-28 LAB — TROPONIN I: Troponin I: <0.03 ng/mL (ref <0.031)

## 2014-10-28 MED ORDER — LORAZEPAM 2 MG/ML IJ SOLN
2.0000 mg | Freq: Once | INTRAMUSCULAR | Status: AC
  Administered 2014-10-28: 2 mg via INTRAMUSCULAR

## 2014-10-28 MED ORDER — LORAZEPAM 2 MG/ML IJ SOLN
INTRAMUSCULAR | Status: AC
Start: 2014-10-28 — End: 2014-10-28
  Administered 2014-10-28: 2 mg via INTRAMUSCULAR
  Filled 2014-10-28: qty 1

## 2014-10-28 NOTE — ED Notes (Signed)
Per ems pt from home, per neighbor pt was destroying stuff in house with pipe. Per neighbor pt was ordering unwanted pizza for her. sherriff officer at bedside. No IVC paper
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
## 2014-10-28 NOTE — BH Assessment (Signed)
Reviewed ED notes prior to initiating assessment. Per notes pt was destroying items in his home, and ordering unwanted pizza for his neighbor. Pt was assessed 10/16/14 and inpt was recommended at that time. It appears he awaited placement for several days and then was discharged with OP resources.   Assessment to commence shortly.    Lear Ng, [facility] Triage Specialist 10/28/2014 9:42 PM

## 2014-10-28 NOTE — BH Assessment (Signed)
Seeking inpt placement. Pt referred to: [place], [place] Triage Specialist 10/28/2014 11:35 PM

## 2014-10-28 NOTE — ED Notes (Signed)
Patient resting with head covered, chest observed for rise and fall. Vital signs deferred at this time due to patient prior behavior.

## 2014-10-28 NOTE — ED Notes (Signed)
Bed: WA21 Expected date:  Expected time:  Means of arrival:  Comments: EMS/50M/med. Clearance

## 2014-10-29 MED ORDER — HALOPERIDOL 5 MG PO TABS
5.0000 mg | ORAL_TABLET | Freq: Two times a day (BID) | ORAL | Status: DC
  Administered 2014-10-29 (×2): 5 mg via ORAL
  Filled 2014-10-29 (×2): qty 1

## 2014-10-29 MED ORDER — BENZTROPINE MESYLATE 1 MG PO TABS
0.5000 mg | ORAL_TABLET | Freq: Two times a day (BID) | ORAL | Status: DC
  Administered 2014-10-29 (×2): 0.5 mg via ORAL
  Filled 2014-10-29 (×2): qty 1

## 2014-10-29 MED ORDER — FLUOXETINE HCL 20 MG PO CAPS
20.0000 mg | ORAL_CAPSULE | Freq: Every day | ORAL | Status: DC
  Administered 2014-10-29: 20 mg via ORAL
  Filled 2014-10-29: qty 1

## 2014-10-29 MED ORDER — LISINOPRIL 10 MG PO TABS
10.0000 mg | ORAL_TABLET | Freq: Every day | ORAL | Status: DC
  Administered 2014-10-29: 10 mg via ORAL
  Filled 2014-10-29: qty 1

## 2014-10-29 MED ORDER — BACITRACIN-NEOMYCIN-POLYMYXIN OINTMENT TUBE
TOPICAL_OINTMENT | CUTANEOUS | Status: DC | PRN
  Administered 2014-10-29: 11:00:00 via TOPICAL
  Filled 2014-10-29: qty 15

## 2014-10-29 MED ORDER — TRAZODONE HCL 100 MG PO TABS
100.0000 mg | ORAL_TABLET | Freq: Every day | ORAL | Status: DC
  Administered 2014-10-29: 100 mg via ORAL
  Filled 2014-10-29: qty 1

## 2014-10-29 NOTE — BH Assessment (Signed)
10-29-14  Per Vita Barley) at [facility], pt accepted by Dr. Lazarus Salines. Pt accepted to [facility]. Pls call report to [phone]. Pls send pt after 9am.   RN informed.    Lear Ng, [facility] Triage Specialist 10/29/2014 7:07 AM

## 2014-10-29 NOTE — ED Notes (Signed)
Pt not forthcoming with information, refusing to answer questions. During assessment states he does not want to talk about it.  Pt labile, irritable. Awake, alert & responsive, no distress noted.  Monitoring for safety, Q 15 min checks in effect.
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
## 2014-10-29 NOTE — ED Notes (Signed)
Pt c/o of his feet hurting. He has two small cut areas under each of his big toes. Cleaned with normal saline. Placed neosporin ointment and band aides. Safety maintained.

## 2014-10-29 NOTE — ED Notes (Signed)
Pt asked for shower items this morning and was pleasant. He was given items and completed his shower. As returning to his room, pt started throwing his toothbrush and socks and slammed his door. Pt then slung his tray table. When writer went to check on pt, pt reported that he had pain and writer could not understand if he was saying hand or head hurt. When pt was asked to clarify, he responded "Can't you read, get the hell out." He then turned over in bed. Will continue to monitor q 15 minute checks. Safety maintained in the [facility].

## 2015-04-11 ENCOUNTER — Emergency Department (HOSPITAL_COMMUNITY)
Admission: EM | Admit: 2015-04-11 | Discharge: 2015-04-13 | Disposition: A | Discharge status: Other Acute Inpatient Hospital | Payer: Medicare Other | Attending: Emergency Medicine | Admitting: Emergency Medicine

## 2015-04-11 ENCOUNTER — Encounter (HOSPITAL_COMMUNITY): Payer: Self-pay | Admitting: *Deleted

## 2015-04-11 DIAGNOSIS — F29 Unspecified psychosis not due to a substance or known physiological condition: Secondary | ICD-10-CM | POA: Insufficient documentation

## 2015-04-11 DIAGNOSIS — F1210 Cannabis abuse, uncomplicated: Secondary | ICD-10-CM | POA: Insufficient documentation

## 2015-04-11 DIAGNOSIS — F17200 Nicotine dependence, unspecified, uncomplicated: Secondary | ICD-10-CM | POA: Insufficient documentation

## 2015-04-11 DIAGNOSIS — F1310 Sedative, hypnotic or anxiolytic abuse, uncomplicated: Secondary | ICD-10-CM | POA: Insufficient documentation

## 2015-04-11 DIAGNOSIS — F419 Anxiety disorder, unspecified: Secondary | ICD-10-CM | POA: Insufficient documentation

## 2015-04-11 DIAGNOSIS — F250 Schizoaffective disorder, bipolar type: Secondary | ICD-10-CM

## 2015-04-11 DIAGNOSIS — F259 Schizoaffective disorder, unspecified: Secondary | ICD-10-CM | POA: Diagnosis present

## 2015-04-11 DIAGNOSIS — F1920 Other psychoactive substance dependence, uncomplicated: Secondary | ICD-10-CM | POA: Diagnosis present

## 2015-04-11 DIAGNOSIS — F1020 Alcohol dependence, uncomplicated: Secondary | ICD-10-CM | POA: Diagnosis present

## 2015-04-11 DIAGNOSIS — F1410 Cocaine abuse, uncomplicated: Secondary | ICD-10-CM | POA: Insufficient documentation

## 2015-04-11 DIAGNOSIS — R451 Restlessness and agitation: Secondary | ICD-10-CM | POA: Insufficient documentation

## 2015-04-11 DIAGNOSIS — Z79899 Other long term (current) drug therapy: Secondary | ICD-10-CM | POA: Insufficient documentation

## 2015-04-11 LAB — CBC WITH DIFFERENTIAL/PLATELET
Basophils Absolute: 0 10*3/uL (ref 0.0–0.1)
Basophils Relative: 0 %
Eosinophils Absolute: 0 10*3/uL (ref 0.0–0.7)
Eosinophils Relative: 0 %
HCT: 38.1 % — ABNORMAL LOW (ref 39.0–52.0)
Hemoglobin: 12.7 g/dL — ABNORMAL LOW (ref 13.0–17.0)
Lymphocytes Relative: 13 %
Lymphs Abs: 1.6 10*3/uL (ref 0.7–4.0)
MCH: 30.2 pg (ref 26.0–34.0)
MCHC: 33.3 g/dL (ref 30.0–36.0)
MCV: 90.5 fL (ref 78.0–100.0)
Monocytes Absolute: 0.6 10*3/uL (ref 0.1–1.0)
Monocytes Relative: 5 %
Neutro Abs: 9.9 10*3/uL — ABNORMAL HIGH (ref 1.7–7.7)
Neutrophils Relative %: 82 %
Platelets: 308 10*3/uL (ref 150–400)
RBC: 4.21 MIL/uL — ABNORMAL LOW (ref 4.22–5.81)
RDW: 14 % (ref 11.5–15.5)
WBC: 12 10*3/uL — ABNORMAL HIGH (ref 4.0–10.5)

## 2015-04-11 LAB — RAPID URINE DRUG SCREEN, HOSP PERFORMED
Amphetamines: NOT DETECTED
Barbiturates: NOT DETECTED
Benzodiazepines: POSITIVE — AB
Cocaine: POSITIVE — AB
Opiates: NOT DETECTED
Tetrahydrocannabinol: POSITIVE — AB

## 2015-04-11 LAB — BASIC METABOLIC PANEL
Anion gap: 12 (ref 5–15)
BUN: 18 mg/dL (ref 6–20)
CO2: 21 mmol/L — ABNORMAL LOW (ref 22–32)
Calcium: 9.1 mg/dL (ref 8.9–10.3)
Chloride: 114 mmol/L — ABNORMAL HIGH (ref 101–111)
Creatinine, Ser: 1.07 mg/dL (ref 0.61–1.24)
GFR calc Af Amer: >60 mL/min (ref >60)
GFR calc non Af Amer: >60 mL/min (ref >60)
Glucose, Bld: 93 mg/dL (ref 65–99)
Potassium: 4.2 mmol/L (ref 3.5–5.1)
Sodium: 147 mmol/L — ABNORMAL HIGH (ref 135–145)

## 2015-04-11 LAB — ACETAMINOPHEN LEVEL: Acetaminophen (Tylenol), Serum: <10 ug/mL — ABNORMAL LOW (ref 10–30)

## 2015-04-11 LAB — ETHANOL: Alcohol, Ethyl (B): 248 mg/dL — ABNORMAL HIGH (ref <5)

## 2015-04-11 LAB — CK: Total CK: 244 U/L (ref 49–397)

## 2015-04-11 LAB — SALICYLATE LEVEL: Salicylate Lvl: <4 mg/dL (ref 2.8–30.0)

## 2015-04-11 MED ORDER — STERILE WATER FOR INJECTION IJ SOLN
INTRAMUSCULAR | Status: AC
  Administered 2015-04-11: 17:00:00
  Filled 2015-04-11: qty 10

## 2015-04-11 MED ORDER — LORAZEPAM 2 MG/ML IJ SOLN
2.0000 mg | Freq: Once | INTRAMUSCULAR | Status: AC
  Administered 2015-04-11: 2 mg via INTRAVENOUS
  Filled 2015-04-11: qty 1

## 2015-04-11 MED ORDER — ZIPRASIDONE MESYLATE 20 MG IM SOLR
20.0000 mg | Freq: Four times a day (QID) | INTRAMUSCULAR | Status: DC | PRN
  Administered 2015-04-11: 20 mg via INTRAMUSCULAR
  Filled 2015-04-11: qty 20
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
## 2015-04-11 NOTE — ED Provider Notes (Addendum)
CSN: [id number]     Arrival date & time 04/11/15  1600 History   First MD Initiated Contact with Patient 04/11/15 1604     Chief Complaint  Patient presents with  . Altered Mental Status     Patient is a 55 y.o. male presenting with altered mental status.  Altered Mental Status   Patient presents for evaluation accompanied by [facility], and EMS.  Per paramedics report, he was "rolling around in the street". Apparently some memory of the neighborhood stated that he was throwing trash cans around the yard down the street. He was not attempting to harm himself or others. He did not make homicidal or suicidal statements. His behavior is described as "erratic and uncooperative". Apparently his speech was unintelligible.  Was brought in for evaluation. He was given IM Versed 5 mg, IM Haldol 5 mg, and was slightly more calm in route.  Past Medical History  Diagnosis Date  . Anxiety   . Schizoaffective disorder ([place])   . Polysubstance abuse     cocaine, etoh, marijuana  . Psychosis    Past Surgical History  Procedure Laterality Date  . Hernia repair     History reviewed. No pertinent family history. Social History  Substance Use Topics  . Smoking status: Current Every Day Smoker -- 0.50 packs/day for 4 years  . Smokeless tobacco: None  . Alcohol Use: Yes     Comment: Once every 3 months or so.     Review of Systems  Unable to perform ROS: Mental status change      Allergies  Review of patient's allergies indicates no known allergies.  Home Medications   Prior to Admission medications   Medication Sig Start Date End Date Taking? Authorizing Provider  benztropine (COGENTIN) 0.5 MG tablet Take 1 tablet (0.5 mg total) by mouth 2 (two) times daily. 10/18/14   Delfin Gant, NP  FLUoxetine (PROZAC) 20 MG capsule Take 1 capsule (20 mg total) by mouth daily. 10/18/14   Delfin Gant, NP  haloperidol (HALDOL) 5 MG tablet Take 1 tablet (5 mg total) by mouth 2  (two) times daily. 10/18/14   Delfin Gant, NP  lisinopril (PRINIVIL,ZESTRIL) 10 MG tablet Take 1 tablet (10 mg total) by mouth daily. 06/14/13   Niel Hummer, NP  Multiple Vitamin (MULTIVITAMIN WITH MINERALS) TABS tablet Take 1 tablet by mouth daily. May purchase over the counter. 06/14/13   Niel Hummer, NP  traZODone (DESYREL) 100 MG tablet Take 1 tablet (100 mg total) by mouth at bedtime. 10/18/14   Delfin Gant, NP   BP 133/76 mmHg  Pulse 83  Temp(Src) 98.7 F (37.1 C) (Oral)  Resp 14  SpO2 99% Physical Exam  Constitutional: He is restrained.  Awake alert. Rapid pressured speech. Majority of his speech, I am unable to understand.  HENT:  No signs of trauma the head. Pupils 3 mm symmetric reactive  Cardiovascular:  Regular rate and rhythm. Rate of 80 in the monitor. Sinus rhythm.  Pulmonary/Chest:  Bilateral breath sounds.  Abdominal: Soft. He exhibits no distension.  Musculoskeletal: Normal range of motion.  Neurological: He is alert.  Psychiatric: His affect is labile. His speech is rapid and/or pressured. He is agitated and hyperactive. He is not actively hallucinating and not combative. He is noncommunicative.    ED Course  Procedures (including critical care time) Labs Review Labs Reviewed  ACETAMINOPHEN LEVEL - Abnormal; Notable for the following:    Acetaminophen (Tylenol), Serum <10 (*)  All other components within normal limits  ETHANOL - Abnormal; Notable for the following:    Alcohol, Ethyl (B) 248 (*)    All other components within normal limits  URINE RAPID DRUG SCREEN, HOSP PERFORMED - Abnormal; Notable for the following:    Cocaine POSITIVE (*)    Benzodiazepines POSITIVE (*)    Tetrahydrocannabinol POSITIVE (*)    All other components within normal limits  CBC WITH DIFFERENTIAL/PLATELET - Abnormal; Notable for the following:    WBC 12.0 (*)    RBC 4.21 (*)    Hemoglobin 12.7 (*)    HCT 38.1 (*)    Neutro Abs 9.9 (*)    All other  components within normal limits  BASIC METABOLIC PANEL - Abnormal; Notable for the following:    Sodium 147 (*)    Chloride 114 (*)    CO2 21 (*)    All other components within normal limits  SALICYLATE LEVEL  CK    Imaging Review No results found. I have personally reviewed and evaluated these images and lab results as part of my medical decision-making.   EKG Interpretation None      MDM   Final diagnoses:  None    Patient given additional sedation here with Geodon as he was still agitated and hyperactive. Also given additional 2 mg IM Ativan. Tox culture positive for cocaine, benzodiazepines, and THC. Acetaminophen and salicylate levels negative. Ethanol 248. Sodium 147 chloride 114 CO2 21 glucose 93.  I'm uncertain if this is an acute exacerbation of psychiatric disorder or simple intoxication and cocaine effects. More sedate after medications now. I will reevaluate him when more calm and less sedate.  20:22:  Patient is more sedate and actually sleeping. Upon awakening his words are more comprehensible. However, he is tangential and I am unable to follow history of thought. Does not appear to be obviously following any external stimuli.  I will ask for [facility] evaluation.  22:34:  Shouldn't wake. Becoming increasingly agitated again. But able to speak and follow conversation. Denies that he interacted with police earlier. I will ask for behavioral health evaluation considering patient's psychiatric history.  Tanna Furry, MD 04/11/15 3762  Tanna Furry, MD 04/11/15 [id number]

## 2015-04-11 NOTE — ED Notes (Signed)
Pt more cooperative when speaking with MD Jeneen Rinks.  Pt alert now and opens eyes spontaneously.  Pt remains in soft restraints because he still has agitation and is not completely oriented.  Pt raising voice and aggressive but redirectable at this time.

## 2015-04-11 NOTE — ED Notes (Signed)
Pt given sprite to relax him but he is becoming more verbally aggressive.

## 2015-04-11 NOTE — ED Notes (Signed)
Bed: WA04 Expected date: 04/11/15 Expected time: 3:58 PM Means of arrival: Ambulance Comments: Psychotic outburst, Haldol given and Versed

## 2015-04-11 NOTE — BH Assessment (Addendum)
Tele Assessment Note   Keith Murray is an 55 y.o. male was brought to [facility] by the police after they noticed him acting peculiar. Pt originally reported that he would not answer any of the writer's questions but did open up after a few minutes. Pt reports that he was working and the police just picked him up for no reason. Pt reports A/V hallucinations when he does not take his medication that he gets from [facility]. Pt reports being compliant with his medications and lives alone. Pt could not say if he had an ACTT team or not. Pt denied SI, HI, any current substance use, depression, and anxiety. Pt was pleasant for most of the assessment and reported that he was fine because he could smile. Per Darlyne Russian, PA, pt will be held in the ED for a psych eval in the morning.    Diagnosis: F25.1 Schizoaffective disorder  Past Medical History:  Past Medical History  Diagnosis Date  . Anxiety   . Schizoaffective disorder ([place])   . Polysubstance abuse     cocaine, etoh, marijuana  . Psychosis     Past Surgical History  Procedure Laterality Date  . Hernia repair      Family History: History reviewed. No pertinent family history.  Social History:  reports that he has been smoking.  He does not have any smokeless tobacco history on file. He reports that he drinks alcohol. He reports that he uses illicit drugs (Cocaine and Marijuana).  Additional Social History:  Alcohol / Drug Use Pain Medications: pt denies Prescriptions: pt denies Over the Counter: pt denies History of alcohol / drug use?: No history of alcohol / drug abuse  CIWA: CIWA-Ar BP: 132/92 mmHg Pulse Rate: 93 COWS:    PATIENT STRENGTHS: (choose at least two) Capable of independent living Communication skills  Allergies: No Known Allergies  Home Medications:  (Not in a hospital admission)  OB/GYN Status:  No LMP for male patient.  General Assessment Data Location of Assessment: [facility] ED TTS Assessment: In system Is this  a Tele or Face-to-Face Assessment?: Face-to-Face Is this an Initial Assessment or a Re-assessment for this encounter?: Initial Assessment Marital status: Single Is patient pregnant?: No Pregnancy Status: No Living Arrangements: Alone Can pt return to current living arrangement?: Yes Admission Status: Voluntary Is patient capable of signing voluntary admission?: Yes Referral Source: Other ([employer])  Medical Screening Exam [facility]) Medical Exam completed: Yes  Crisis Care Plan Living Arrangements: Alone  Education Status Is patient currently in school?: No Highest grade of school patient has completed: HS  Risk to self with the past 6 months Suicidal Ideation: No Has patient been a risk to self within the past 6 months prior to admission? : No Suicidal Intent: No Has patient had any suicidal intent within the past 6 months prior to admission? : No Is patient at risk for suicide?: No Suicidal Plan?: No Has patient had any suicidal plan within the past 6 months prior to admission? : No Access to Means: No What has been your use of drugs/alcohol within the last 12 months?: none Previous Attempts/Gestures: No How many times?: 0 Triggers for Past Attempts: None known Intentional Self Injurious Behavior: None Family Suicide History: No Recent stressful life event(s):  (pt denies) Persecutory voices/beliefs?: No Depression: No Depression Symptoms:  (pt denies) Substance abuse history and/or treatment for substance abuse?: No Suicide prevention information given to non-admitted patients: Not applicable  Risk to Others within the past 6 months Homicidal  Ideation: No Does patient have any lifetime risk of violence toward others beyond the six months prior to admission? : No Thoughts of Harm to Others: No Current Homicidal Intent: No Current Homicidal Plan: No Access to Homicidal Means: No Identified Victim: none History of harm to others?: No Assessment of Violence: None  Noted Violent Behavior Description: none Does patient have access to weapons?: No Criminal Charges Pending?: No Does patient have a court date: No Is patient on probation?: No  Psychosis Hallucinations: Auditory, Visual Delusions: None noted  Mental Status Report Appearance/Hygiene: Unremarkable, In scrubs Eye Contact: Fair Motor Activity: Freedom of movement, Unremarkable Speech: Logical/coherent Level of Consciousness: Alert Mood: Anxious Affect: Appropriate to circumstance Anxiety Level: None Thought Processes: Coherent, Relevant Judgement: Impaired Orientation: Person, Place, Time, Situation Obsessive Compulsive Thoughts/Behaviors: None  Cognitive Functioning Concentration: Normal Memory: Recent Intact, Remote Intact IQ: Average Insight: Fair Impulse Control: Fair Appetite: Fair Weight Loss: 10 Sleep: Decreased Total Hours of Sleep: 5 Vegetative Symptoms: None (pt denies)  ADLScreening [facility] Assessment Services) Patient's cognitive ability adequate to safely complete daily activities?: Yes Patient able to express need for assistance with ADLs?: Yes Independently performs ADLs?: Yes (appropriate for developmental age)  Prior Inpatient Therapy Prior Inpatient Therapy: Yes Prior Therapy Dates: 2012, 2014, 2015 Prior Therapy Facilty/Provider(s): [facility] Reason for Treatment: Psychosis  Prior Outpatient Therapy Prior Outpatient Therapy: Yes Prior Therapy Dates: ongoing [facility] Reason for Treatment: psychosis Does patient have an ACCT team?: No Does patient have Intensive In-House Services?  : No Does patient have [facility] services? : Yes Does patient have P4CC services?: No  ADL Screening (condition at time of admission) Patient's cognitive ability adequate to safely complete daily activities?: Yes Is the patient deaf or have difficulty hearing?: No Does the patient have difficulty seeing, even when wearing glasses/contacts?:  No Does the patient have difficulty concentrating, remembering, or making decisions?: No Patient able to express need for assistance with ADLs?: Yes Does the patient have difficulty dressing or bathing?: No Independently performs ADLs?: Yes (appropriate for developmental age) Does the patient have difficulty walking or climbing stairs?: No       Abuse/Neglect Assessment (Assessment to be complete while patient is alone) Physical Abuse: Denies Verbal Abuse: Yes, past (Comment) (in childhood) Sexual Abuse: Denies Exploitation of patient/patient's resources: Denies Self-Neglect: Denies Values / Beliefs Cultural Requests During Hospitalization: None Spiritual Requests During Hospitalization: None   Advance Directives (For Healthcare) Does patient have an advance directive?: No Would patient like information on creating an advanced directive?: No - patient declined information    Additional Information 1:1 In Past 12 Months?: No CIRT Risk: No Elopement Risk: No Does patient have medical clearance?: Yes     Disposition:  Disposition Initial Assessment Completed for this Encounter: Yes Disposition of Patient: Other dispositions  Ruben Reason, MA, Alesia Banda, LCASA Therapeutic Triage Specialist [facility]   04/11/2015 11:15 PM

## 2015-04-12 DIAGNOSIS — F250 Schizoaffective disorder, bipolar type: Secondary | ICD-10-CM | POA: Diagnosis not present

## 2015-04-12 DIAGNOSIS — F1920 Other psychoactive substance dependence, uncomplicated: Secondary | ICD-10-CM

## 2015-04-12 DIAGNOSIS — F1020 Alcohol dependence, uncomplicated: Secondary | ICD-10-CM | POA: Diagnosis not present

## 2015-04-12 DIAGNOSIS — R45850 Homicidal ideations: Secondary | ICD-10-CM

## 2015-04-12 MED ORDER — CHLORDIAZEPOXIDE HCL 25 MG PO CAPS
25.0000 mg | ORAL_CAPSULE | Freq: Four times a day (QID) | ORAL | Status: AC
  Administered 2015-04-12 – 2015-04-13 (×4): 25 mg via ORAL
  Filled 2015-04-12 (×4): qty 1

## 2015-04-12 MED ORDER — ADULT MULTIVITAMIN W/MINERALS CH
1.0000 | ORAL_TABLET | Freq: Every day | ORAL | Status: DC
  Administered 2015-04-12 – 2015-04-13 (×2): 1 via ORAL
  Filled 2015-04-12: qty 1

## 2015-04-12 MED ORDER — LORAZEPAM 2 MG/ML IJ SOLN
2.0000 mg | INTRAMUSCULAR | Status: DC | PRN
  Administered 2015-04-12: 2 mg via INTRAMUSCULAR
  Filled 2015-04-12: qty 1

## 2015-04-12 MED ORDER — BENZTROPINE MESYLATE 1 MG PO TABS
0.5000 mg | ORAL_TABLET | Freq: Two times a day (BID) | ORAL | Status: DC
  Administered 2015-04-12 – 2015-04-13 (×3): 0.5 mg via ORAL
  Filled 2015-04-12 (×3): qty 1

## 2015-04-12 MED ORDER — HALOPERIDOL LACTATE 5 MG/ML IJ SOLN
5.0000 mg | Freq: Once | INTRAMUSCULAR | Status: AC
  Administered 2015-04-12: 5 mg via INTRAMUSCULAR
  Filled 2015-04-12: qty 1

## 2015-04-12 MED ORDER — CHLORDIAZEPOXIDE HCL 25 MG PO CAPS: 25.0000 mg | ORAL_CAPSULE | Freq: Every day | ORAL | Status: DC

## 2015-04-12 MED ORDER — HYDROXYZINE HCL 25 MG PO TABS: 25.0000 mg | ORAL_TABLET | Freq: Four times a day (QID) | ORAL | Status: DC | PRN

## 2015-04-12 MED ORDER — CHLORDIAZEPOXIDE HCL 25 MG PO CAPS: 25.0000 mg | ORAL_CAPSULE | Freq: Three times a day (TID) | ORAL | Status: DC

## 2015-04-12 MED ORDER — HALOPERIDOL 5 MG PO TABS
5.0000 mg | ORAL_TABLET | Freq: Two times a day (BID) | ORAL | Status: DC
  Administered 2015-04-12 – 2015-04-13 (×3): 5 mg via ORAL
  Filled 2015-04-12 (×3): qty 1

## 2015-04-12 MED ORDER — TRAZODONE HCL 100 MG PO TABS
100.0000 mg | ORAL_TABLET | Freq: Every day | ORAL | Status: DC
  Administered 2015-04-12: 100 mg via ORAL
  Filled 2015-04-12: qty 1

## 2015-04-12 MED ORDER — VITAMIN B-1 100 MG PO TABS
100.0000 mg | ORAL_TABLET | Freq: Every day | ORAL | Status: DC
  Administered 2015-04-13: 100 mg via ORAL
  Filled 2015-04-12 (×2): qty 1

## 2015-04-12 MED ORDER — LISINOPRIL 10 MG PO TABS
10.0000 mg | ORAL_TABLET | Freq: Every day | ORAL | Status: DC
  Administered 2015-04-12 – 2015-04-13 (×2): 10 mg via ORAL
  Filled 2015-04-12 (×2): qty 1

## 2015-04-12 MED ORDER — LOPERAMIDE HCL 2 MG PO CAPS
2.0000 mg | ORAL_CAPSULE | ORAL | Status: DC | PRN
Start: 2015-04-12 — End: 2015-04-13

## 2015-04-12 MED ORDER — CHLORDIAZEPOXIDE HCL 25 MG PO CAPS: 25.0000 mg | ORAL_CAPSULE | ORAL | Status: DC

## 2015-04-12 MED ORDER — FLUOXETINE HCL 20 MG PO CAPS
20.0000 mg | ORAL_CAPSULE | Freq: Every day | ORAL | Status: DC
  Administered 2015-04-12 – 2015-04-13 (×2): 20 mg via ORAL
  Filled 2015-04-12 (×2): qty 1

## 2015-04-12 MED ORDER — ONDANSETRON 4 MG PO TBDP: 4.0000 mg | ORAL_TABLET | Freq: Four times a day (QID) | ORAL | Status: DC | PRN

## 2015-04-12 MED ORDER — THIAMINE HCL 100 MG/ML IJ SOLN: 100.0000 mg | Freq: Once | INTRAMUSCULAR | Status: DC

## 2015-04-12 MED ORDER — CHLORDIAZEPOXIDE HCL 25 MG PO CAPS: 25.0000 mg | ORAL_CAPSULE | Freq: Four times a day (QID) | ORAL | Status: DC | PRN

## 2015-04-12 NOTE — ED Notes (Signed)
Pt has calmed down considerably but is still squirming around.  Soft wrist restraints still on pt.  Sitter at bedside.  NAD.

## 2015-04-12 NOTE — ED Notes (Signed)
Up to the bathroom, pt ambulatory w/o difficulty

## 2015-04-12 NOTE — ED Notes (Signed)
Bed: WA33 Expected date:  Expected time:  Means of arrival:  Comments: 

## 2015-04-12 NOTE — ED Notes (Signed)
Report received from [place]. Pt. Sleeping, respirations regular and unlabored. Will continue to monitor for safety via security cameras and Q 15 minute checks.

## 2015-04-12 NOTE — ED Notes (Signed)
IV dc'd, cath intact site wnl

## 2015-04-12 NOTE — ED Notes (Addendum)
Pt is cooperative. He was transferred from the main ED./ Pt was given breakfast . Condom cath remains in tact. Pt has a Actuary . He denies SI and HI and contracts for safety. Pt took a shower and did not need assistance. Pt is pleasant but his affect appears blunted and depressed. Pt remains a 1;1 . 3:20pm _Report to [employer] . Pt transferred back to room 34. Steady gait.

## 2015-04-12 NOTE — Consult Note (Signed)
[place] [facility] Consult   Reason for Consult:  Psychosis, noncompliant with medications, drug use Referring Physician:  EDP Patient Identification: Keith Murray MRN:  [id number] Principal Diagnosis: Schizoaffective disorder, bipolar type [facility]) Diagnosis:   Patient Active Problem List   Diagnosis Date Noted  . Schizoaffective disorder, bipolar type ([place]) [F25.0]     Priority: High  . Alcohol dependence ([place]) [F10.20] 06/08/2013    Priority: High  . Polysubstance (excluding opioids) dependence ([place]) [F19.20] 08/08/2012    Priority: High  . Psychosis [F29] 08/04/2012    Priority: High  . Psychoses [F29]     Total Time spent with patient: 45 minutes  Subjective:   Keith Murray is a 55 y.o. male patient admitted with psychosis.  HPI:  On admission:  55 y.o. male was brought to [facility] by the police after they noticed him acting peculiar. Pt originally reported that he would not answer any of the writer's questions but did open up after a few minutes. Pt reports that he was working and the police just picked him up for no reason. Pt reports A/V hallucinations when he does not take his medication that he gets from [place]. Pt reports being compliant with his medications and lives alone. Pt could not say if he had an ACTT team or not. Pt denied SI, HI, any current substance use, depression, and anxiety. Pt was pleasant for most of the assessment and reported that he was fine because he could smile. Per Darlyne Russian, PA, pt will be held in the ED for a psych eval in the morning.   Today:  Patient had been abuse alcohol and drugs last night when the police brought him to the ED for acting strangely.  He reports not taking his medications for the past 3 days and angry towards [facility].  Patient not stable at this time, auditory and visual hallucinations with homicidal ideations towards [address] but no plan or intent.  Past Psychiatric History: schizoaffective disorder, bipolar type  Risk  to Self: Suicidal Ideation: No Suicidal Intent: No Is patient at risk for suicide?: No Suicidal Plan?: No Access to Means: No What has been your use of drugs/alcohol within the last 12 months?: none How many times?: 0 Triggers for Past Attempts: None known Intentional Self Injurious Behavior: None Risk to Others: Homicidal Ideation: No Thoughts of Harm to Others: No Current Homicidal Intent: No Current Homicidal Plan: No Access to Homicidal Means: No Identified Victim: none History of harm to others?: No Assessment of Violence: None Noted Violent Behavior Description: none Does patient have access to weapons?: No Criminal Charges Pending?: No Does patient have a court date: No Prior Inpatient Therapy: Prior Inpatient Therapy: Yes Prior Therapy Dates: 2012, 2014, 2015 Prior Therapy Facilty/Provider(s): [facility] Reason for Treatment: Psychosis Prior Outpatient Therapy: Prior Outpatient Therapy: Yes Prior Therapy Dates: ongoing Prior Therapy Facilty/Provider(s): [facility] Reason for Treatment: psychosis Does patient have an ACCT team?: No Does patient have Intensive In-House Services?  : No Does patient have [facility] services? : Yes Does patient have [facility] services?: No  Past Medical History:  Past Medical History  Diagnosis Date  . Anxiety   . Schizoaffective disorder ([place])   . Polysubstance abuse     cocaine, etoh, marijuana  . Psychosis     Past Surgical History  Procedure Laterality Date  . Hernia repair     Family History: History reviewed. No pertinent family history. Family Psychiatric  History: NOne Social History:  History  Alcohol Use  . Yes  Comment: Once every 3 months or so.      History  Drug Use  . Yes  . Special: Cocaine, Marijuana    Comment: THC about once per week and cocaine on occasion.     Social History   Social History  . Marital Status: Single    Spouse Name: N/A  . Number of Children: N/A  . Years of Education: N/A   Social History  Main Topics  . Smoking status: Current Every Day Smoker -- 0.50 packs/day for 4 years  . Smokeless tobacco: None  . Alcohol Use: Yes     Comment: Once every 3 months or so.   . Drug Use: Yes    Special: Cocaine, Marijuana     Comment: THC about once per week and cocaine on occasion.   . Sexual Activity: Yes    Birth Control/ Protection: Condom   Other Topics Concern  . None   Social History Narrative   Additional Social History:    Pain Medications: pt denies Prescriptions: pt denies Over the Counter: pt denies History of alcohol / drug use?: No history of alcohol / drug abuse                     Allergies:  No Known Allergies  Labs:  Results for orders placed or performed during the hospital encounter of 04/11/15 (from the past 48 hour(s))  Acetaminophen level     Status: Abnormal   Collection Time: 04/11/15  4:26 PM  Result Value Ref Range   Acetaminophen (Tylenol), Serum <10 (L) 10 - 30 ug/mL    Comment:        THERAPEUTIC CONCENTRATIONS VARY SIGNIFICANTLY. A RANGE OF 10-30 ug/mL MAY BE AN EFFECTIVE CONCENTRATION FOR MANY PATIENTS. HOWEVER, SOME ARE BEST TREATED AT CONCENTRATIONS OUTSIDE THIS RANGE. ACETAMINOPHEN CONCENTRATIONS >150 ug/mL AT 4 HOURS AFTER INGESTION AND >50 ug/mL AT 12 HOURS AFTER INGESTION ARE OFTEN ASSOCIATED WITH TOXIC REACTIONS.   Ethanol     Status: Abnormal   Collection Time: 04/11/15  4:26 PM  Result Value Ref Range   Alcohol, Ethyl (B) 248 (H) <5 mg/dL    Comment:        LOWEST DETECTABLE LIMIT FOR SERUM ALCOHOL IS 5 mg/dL FOR MEDICAL PURPOSES ONLY   Salicylate level     Status: None   Collection Time: 04/11/15  4:26 PM  Result Value Ref Range   Salicylate Lvl <5.0 2.8 - 30.0 mg/dL  CBC with Differential/Platelet     Status: Abnormal   Collection Time: 04/11/15  4:26 PM  Result Value Ref Range   WBC 12.0 (H) 4.0 - 10.5 K/uL   RBC 4.21 (L) 4.22 - 5.81 MIL/uL   Hemoglobin 12.7 (L) 13.0 - 17.0 g/dL   HCT 38.1 (L) 39.0 -  52.0 %   MCV 90.5 78.0 - 100.0 fL   MCH 30.2 26.0 - 34.0 pg   MCHC 33.3 30.0 - 36.0 g/dL   RDW 14.0 11.5 - 15.5 %   Platelets 308 150 - 400 K/uL   Neutrophils Relative % 82 %   Neutro Abs 9.9 (H) 1.7 - 7.7 K/uL   Lymphocytes Relative 13 %   Lymphs Abs 1.6 0.7 - 4.0 K/uL   Monocytes Relative 5 %   Monocytes Absolute 0.6 0.1 - 1.0 K/uL   Eosinophils Relative 0 %   Eosinophils Absolute 0.0 0.0 - 0.7 K/uL   Basophils Relative 0 %   Basophils Absolute 0.0 0.0 - 0.1 K/uL  Basic metabolic panel     Status: Abnormal   Collection Time: 04/11/15  4:26 PM  Result Value Ref Range   Sodium 147 (H) 135 - 145 mmol/L   Potassium 4.2 3.5 - 5.1 mmol/L   Chloride 114 (H) 101 - 111 mmol/L   CO2 21 (L) 22 - 32 mmol/L   Glucose, Bld 93 65 - 99 mg/dL   BUN 18 6 - 20 mg/dL   Creatinine, Ser 1.07 0.61 - 1.24 mg/dL   Calcium 9.1 8.9 - 10.3 mg/dL   GFR calc non Af Amer >60 >60 mL/min   GFR calc Af Amer >60 >60 mL/min    Comment: (NOTE) The eGFR has been calculated using the CKD EPI equation. This calculation has not been validated in all clinical situations. eGFR's persistently <60 mL/min signify possible Chronic Kidney Disease.    Anion gap 12 5 - 15  CK     Status: None   Collection Time: 04/11/15  4:26 PM  Result Value Ref Range   Total CK 244 49 - 397 U/L  Urine rapid drug screen (hosp performed)     Status: Abnormal   Collection Time: 04/11/15  4:34 PM  Result Value Ref Range   Opiates NONE DETECTED NONE DETECTED   Cocaine POSITIVE (A) NONE DETECTED   Benzodiazepines POSITIVE (A) NONE DETECTED   Amphetamines NONE DETECTED NONE DETECTED   Tetrahydrocannabinol POSITIVE (A) NONE DETECTED   Barbiturates NONE DETECTED NONE DETECTED    Comment:        DRUG SCREEN FOR MEDICAL PURPOSES ONLY.  IF CONFIRMATION IS NEEDED FOR ANY PURPOSE, NOTIFY LAB WITHIN 5 DAYS.        LOWEST DETECTABLE LIMITS FOR URINE DRUG SCREEN Drug Class       Cutoff (ng/mL) Amphetamine      1000 Barbiturate       200 Benzodiazepine   299 Tricyclics       371 Opiates          300 Cocaine          300 THC              50     Current Facility-Administered Medications  Medication Dose Route Frequency Provider Last Rate Last Dose  . benztropine (COGENTIN) tablet 0.5 mg  0.5 mg Oral BID Shellsea Borunda   0.5 mg at 04/12/15 1306  . chlordiazePOXIDE (LIBRIUM) capsule 25 mg  25 mg Oral Q6H PRN Lindell Tussey      . chlordiazePOXIDE (LIBRIUM) capsule 25 mg  25 mg Oral QID Cynthia Stainback   25 mg at 04/12/15 1307   Followed by  . [START ON 04/13/2015] chlordiazePOXIDE (LIBRIUM) capsule 25 mg  25 mg Oral TID Audre Cenci       Followed by  . [START ON 04/14/2015] chlordiazePOXIDE (LIBRIUM) capsule 25 mg  25 mg Oral BH-qamhs Daivik Overley       Followed by  . [START ON 04/15/2015] chlordiazePOXIDE (LIBRIUM) capsule 25 mg  25 mg Oral Daily Kamelia Lampkins      . FLUoxetine (PROZAC) capsule 20 mg  20 mg Oral Daily Kyla Duffy   20 mg at 04/12/15 1307  . haloperidol (HALDOL) tablet 5 mg  5 mg Oral BID Curt Oatis   5 mg at 04/12/15 1307  . hydrOXYzine (ATARAX/VISTARIL) tablet 25 mg  25 mg Oral Q6H PRN Jessey Stehlin      . lisinopril (PRINIVIL,ZESTRIL) tablet 10 mg  10 mg Oral Daily Halbert Jesson   10  mg at 04/12/15 1307  . loperamide (IMODIUM) capsule 2-4 mg  2-4 mg Oral PRN Tanisha Lutes      . LORazepam (ATIVAN) injection 2 mg  2 mg Intramuscular Q4H PRN Tanna Furry, MD   2 mg at 04/12/15 0058  . multivitamin with minerals tablet 1 tablet  1 tablet Oral Daily Taft Worthing   1 tablet at 04/12/15 1308  . ondansetron (ZOFRAN-ODT) disintegrating tablet 4 mg  4 mg Oral Q6H PRN Raeleen Winstanley      . thiamine (B-1) injection 100 mg  100 mg Intramuscular Once Rionna Feltes   100 mg at 04/12/15 1308  . [START ON 04/13/2015] thiamine (VITAMIN B-1) tablet 100 mg  100 mg Oral Daily Mario Voong      . traZODone (DESYREL) tablet 100 mg  100 mg Oral QHS Machelle Raybon      . ziprasidone (GEODON)  injection 20 mg  20 mg Intramuscular Q6H PRN Tanna Furry, MD   20 mg at 04/11/15 1711   Current Outpatient Prescriptions  Medication Sig Dispense Refill  . benztropine (COGENTIN) 0.5 MG tablet Take 1 tablet (0.5 mg total) by mouth 2 (two) times daily. 60 tablet 0  . FLUoxetine (PROZAC) 20 MG capsule Take 1 capsule (20 mg total) by mouth daily. 30 capsule 0  . haloperidol (HALDOL) 5 MG tablet Take 1 tablet (5 mg total) by mouth 2 (two) times daily. 60 tablet 0  . lisinopril (PRINIVIL,ZESTRIL) 10 MG tablet Take 1 tablet (10 mg total) by mouth daily. 30 tablet 0  . Multiple Vitamin (MULTIVITAMIN WITH MINERALS) TABS tablet Take 1 tablet by mouth daily. May purchase over the counter.    . traZODone (DESYREL) 100 MG tablet Take 1 tablet (100 mg total) by mouth at bedtime. 30 tablet 0    Musculoskeletal: Strength & Muscle Tone: within normal limits Gait & Station: normal Patient leans: N/A  Psychiatric Specialty Exam: Review of Systems  Constitutional: Negative.   HENT: Negative.   Eyes: Negative.   Respiratory: Negative.   Cardiovascular: Negative.   Gastrointestinal: Negative.   Genitourinary: Negative.   Musculoskeletal: Negative.   Skin: Negative.   Neurological: Negative.   Endo/Heme/Allergies: Negative.   Psychiatric/Behavioral: Positive for hallucinations and substance abuse.    Blood pressure 117/72, pulse 86, temperature 98.2 F (36.8 C), temperature source Oral, resp. rate 16, SpO2 100 %.There is no weight on file to calculate BMI.  General Appearance: Casual  Eye Contact::  Fair  Speech:  Normal Rate  Volume:  Increased  Mood:  Angry and Irritable  Affect:  Blunt  Thought Process:  Coherent  Orientation:  Full (Time, Place, and Person)  Thought Content:  Hallucinations: Auditory and visual  Suicidal Thoughts:  No  Homicidal Thoughts:  Yes.  without intent/plan  Memory:  Immediate;   Fair Recent;   Fair Remote;   Fair  Judgement:  Impaired  Insight:  Fair   Psychomotor Activity:  Increased  Concentration:  Fair  Recall:  [employer] of Knowledge:Fair  Language: Fair  Akathisia:  No  Handed:  Right  AIMS (if indicated):     Assets:  Leisure Time Physical Health Resilience  ADL's:  Intact  Cognition: WNL  Sleep:      Treatment Plan Summary: Daily contact with patient to assess and evaluate symptoms and progress in treatment, Medication management and Plan schizoaffective disorder, bipolar type:  -Crisis stabilization -Medication management:  PRN agitation medication required on admission--Haldol 5 mg IM, Ativan 2 mg IM.  Librium alcohol detox protocol started. Prozac 20 mg daily for depression, Haldol 5 mg BID for psychosis, Cogentin 0.5 mg BID for EPS, and Trazodone 100 mg daily for sleep restarted.  Vistaril 25 mg every 6 hours PRN anxiety started. -Individual and substance abuse counseling  Disposition: Recommend psychiatric Inpatient admission when medically cleared.  Waylan Boga, [place] 04/12/2015 5:05 PM Patient seen face-to-face for psychiatric evaluation, chart reviewed and case discussed with the physician extender and developed treatment plan. Reviewed the information documented and agree with the treatment plan. Corena Pilgrim, MD

## 2015-04-12 NOTE — ED Notes (Signed)
Pt. Noted sleeping in room. No complaints or concerns voiced. No distress or abnormal behavior noted. Will continue to monitor with security cameras. Q 15 minute rounds continue. 

## 2015-04-12 NOTE — ED Notes (Signed)
Up walking in hall

## 2015-04-13 ENCOUNTER — Inpatient Hospital Stay (HOSPITAL_COMMUNITY)
Admission: AD | Admit: 2015-04-13 | Discharge: 2015-04-15 | DRG: 885 | Disposition: A | Discharge status: Home / Self Care | Payer: 59 | Source: Intra-hospital | Attending: Psychiatry | Admitting: Psychiatry

## 2015-04-13 ENCOUNTER — Encounter (HOSPITAL_COMMUNITY): Payer: Self-pay | Admitting: *Deleted

## 2015-04-13 DIAGNOSIS — F10129 Alcohol abuse with intoxication, unspecified: Secondary | ICD-10-CM | POA: Diagnosis present

## 2015-04-13 DIAGNOSIS — F1414 Cocaine abuse with cocaine-induced mood disorder: Secondary | ICD-10-CM | POA: Diagnosis present

## 2015-04-13 DIAGNOSIS — F250 Schizoaffective disorder, bipolar type: Secondary | ICD-10-CM | POA: Diagnosis present

## 2015-04-13 MED ORDER — LISINOPRIL 10 MG PO TABS
10.0000 mg | ORAL_TABLET | Freq: Every day | ORAL | Status: DC
  Administered 2015-04-14 – 2015-04-15 (×2): 10 mg via ORAL
  Filled 2015-04-13 (×4): qty 1

## 2015-04-13 MED ORDER — ENSURE ENLIVE PO LIQD
237.0000 mL | Freq: Two times a day (BID) | ORAL | Status: DC
  Administered 2015-04-13 – 2015-04-15 (×5): 237 mL via ORAL

## 2015-04-13 MED ORDER — LORAZEPAM 2 MG/ML IJ SOLN: 2.0000 mg | INTRAMUSCULAR | Status: DC | PRN

## 2015-04-13 MED ORDER — ALUM & MAG HYDROXIDE-SIMETH 200-200-20 MG/5ML PO SUSP
30.0000 mL | ORAL | Status: DC | PRN
Start: 2015-04-13 — End: 2015-04-15

## 2015-04-13 MED ORDER — INFLUENZA VAC SPLIT QUAD 0.5 ML IM SUSY
0.5000 mL | PREFILLED_SYRINGE | INTRAMUSCULAR | Status: AC
  Administered 2015-04-14: 0.5 mL via INTRAMUSCULAR
  Filled 2015-04-13: qty 0.5

## 2015-04-13 MED ORDER — ZIPRASIDONE MESYLATE 20 MG IM SOLR: 20.0000 mg | Freq: Four times a day (QID) | INTRAMUSCULAR | Status: DC | PRN

## 2015-04-13 MED ORDER — HYDROXYZINE HCL 25 MG PO TABS: 25.0000 mg | ORAL_TABLET | Freq: Four times a day (QID) | ORAL | Status: AC | PRN

## 2015-04-13 MED ORDER — THIAMINE HCL 100 MG/ML IJ SOLN
100.0000 mg | Freq: Once | INTRAMUSCULAR | Status: AC
  Administered 2015-04-13: 100 mg via INTRAMUSCULAR
  Filled 2015-04-13: qty 2

## 2015-04-13 MED ORDER — HALOPERIDOL 5 MG PO TABS
5.0000 mg | ORAL_TABLET | Freq: Two times a day (BID) | ORAL | Status: DC
  Administered 2015-04-13 – 2015-04-15 (×5): 5 mg via ORAL
  Filled 2015-04-13 (×9): qty 1

## 2015-04-13 MED ORDER — CHLORDIAZEPOXIDE HCL 25 MG PO CAPS: 25.0000 mg | ORAL_CAPSULE | Freq: Four times a day (QID) | ORAL | Status: AC | PRN

## 2015-04-13 MED ORDER — ADULT MULTIVITAMIN W/MINERALS CH
1.0000 | ORAL_TABLET | Freq: Every day | ORAL | Status: DC
  Administered 2015-04-14 – 2015-04-15 (×2): 1 via ORAL
  Filled 2015-04-13 (×4): qty 1

## 2015-04-13 MED ORDER — TRAZODONE HCL 100 MG PO TABS
100.0000 mg | ORAL_TABLET | Freq: Every day | ORAL | Status: DC
  Administered 2015-04-13 – 2015-04-14 (×2): 100 mg via ORAL
  Filled 2015-04-13 (×5): qty 1

## 2015-04-13 MED ORDER — FLUOXETINE HCL 20 MG PO CAPS
20.0000 mg | ORAL_CAPSULE | Freq: Every day | ORAL | Status: DC
  Administered 2015-04-14 – 2015-04-15 (×2): 20 mg via ORAL
  Filled 2015-04-13 (×4): qty 1

## 2015-04-13 MED ORDER — IBUPROFEN 200 MG PO TABS
600.0000 mg | ORAL_TABLET | Freq: Once | ORAL | Status: AC
  Administered 2015-04-13: 600 mg via ORAL
  Filled 2015-04-13: qty 3

## 2015-04-13 MED ORDER — MAGNESIUM HYDROXIDE 400 MG/5ML PO SUSP
30.0000 mL | Freq: Every day | ORAL | Status: DC | PRN
Start: 2015-04-13 — End: 2015-04-15

## 2015-04-13 MED ORDER — NICOTINE 21 MG/24HR TD PT24
21.0000 mg | MEDICATED_PATCH | Freq: Every day | TRANSDERMAL | Status: DC
  Administered 2015-04-13 – 2015-04-15 (×3): 21 mg via TRANSDERMAL
  Filled 2015-04-13 (×5): qty 1

## 2015-04-13 MED ORDER — VITAMIN B-1 100 MG PO TABS
100.0000 mg | ORAL_TABLET | Freq: Every day | ORAL | Status: DC
  Administered 2015-04-14 – 2015-04-15 (×2): 100 mg via ORAL
  Filled 2015-04-13 (×4): qty 1

## 2015-04-13 MED ORDER — LOPERAMIDE HCL 2 MG PO CAPS: 2.0000 mg | ORAL_CAPSULE | ORAL | Status: AC | PRN

## 2015-04-13 MED ORDER — BENZTROPINE MESYLATE 0.5 MG PO TABS
0.5000 mg | ORAL_TABLET | Freq: Two times a day (BID) | ORAL | Status: DC
  Administered 2015-04-13 – 2015-04-15 (×5): 0.5 mg via ORAL
  Filled 2015-04-13 (×9): qty 1

## 2015-04-13 MED ORDER — ONDANSETRON 4 MG PO TBDP: 4.0000 mg | ORAL_TABLET | Freq: Four times a day (QID) | ORAL | Status: AC | PRN

## 2015-04-13 MED ORDER — ACETAMINOPHEN 325 MG PO TABS
650.0000 mg | ORAL_TABLET | Freq: Four times a day (QID) | ORAL | Status: DC | PRN
Start: 2015-04-13 — End: 2015-04-15

## 2015-04-13 NOTE — ED Notes (Signed)
Pt. Noted sleeping in room. No complaints or concerns voiced. No distress or abnormal behavior noted. Will continue to monitor with security cameras. Q 15 minute rounds continue. 

## 2015-04-13 NOTE — Progress Notes (Signed)
Adult Psychoeducational Group Note  Date:  04/13/2015 Time:  8:53 PM  Group Topic/Focus:  Wrap-Up Group:   The focus of this group is to help patients review their daily goal of treatment and discuss progress on daily workbooks.  Participation Level:  Did Not Attend  Additional Comments:  Pt was resting in bed at the time of group.  Clint Bolder 04/13/2015, 8:53 PM

## 2015-04-13 NOTE — BH Assessment (Addendum)
Keith Murray Assessment Progress Note  Per Corena Pilgrim, MD, this pt requires psychiatric hospitalization at this time.  Letitia Libra, RN, [facility] reports that a bed will be available for this pt on the 300 hall later today.  She will call when it is ready.  She advises me to have pt sign admission paperwork.  Pt has signed Voluntary Admission and Consent for Treatment, as well as Consent to Release Information to his niece, Keith Murray, and to Keith Murray, his outpatient provider, and a notification call has been placed to the latter.  Signed forms have been faxed to [facility].  Pt's nurse, Nena Jordan, has been notified, and agrees to send original paperwork along with pt via [place] [place], and to call report to [phone].  Jalene Mullet, MA Triage Specialist [phone]   Addendum:  At 13:52 Letitia Libra, RN, [facility] calls to report that pt has been assigned to Rm [id number]. Waylan Boga, NP, and pt's nurse, Nena Jordan, have been notified.  Jalene Mullet, [place] Triage Specialist [phone]

## 2015-04-13 NOTE — ED Notes (Signed)
Pt discharged ambulatory with [place] driver.  Pt denied SI, HI, AVH.  All belongings were returned to pt.

## 2015-04-13 NOTE — BH Assessment (Signed)
Patient was reassessed by TTS.   Patient is in his room watching television. Patient alert and oriented and cooperative with assessment.  Patient states that he came in for stabilization on medication. Patient states that he goes to [facility] and plans to go back once he is discharged. Patient states that he denies SI/HI and AVH. Patient states that he has been eating and sleeping well. Patient states that he has been taking his medications as prescribed and is feeling better at this time.    Dr. Darleene Cleaver continues to recommend inpatient treatment on the 300 hall at this time.   Rosalin Hawking, LCSW Therapeutic Triage Specialist [place] 04/13/2015 9:14 AM

## 2015-04-13 NOTE — Progress Notes (Signed)
On initial assessment at the beginning of the shift, pt was in bed with the covers pulled up over his head.  Writer entered the room and called pt's name.  He answered, but did not pull down the covers.  He answered writer's questions and was appropriate in his answers, but did not uncover his face.  He denies SI/HI/AVH.  He voiced no needs or concerns at this time.  He was informed of the medications that were ordered for the night and was agreeable.  Writer left the room at that time.  After the evening group time and when snacks were being given, pt did come out of the room and went to the dayroom to watch TV for a while.  He is pleasant and cooperative.  He denies any withdrawal symptoms at this time.  Pt was encouraged to make his needs known to staff.  Support and encouragement offered.  Discharge plans are in process.  Safety maintained with q15 minute checks.

## 2015-04-13 NOTE — Progress Notes (Addendum)
Patient is 55 yrs old, voluntary.  Police found patient acting peculiar and brought him to ED.  Has not been taking his medications from [facility].  During admission, parent stated he is not SI but could be HI if people mess with him.  Stated he does drink alcohol, stating "I'm not admitting to nothing."  Patient denied THC, cocaine and heroin.  Stated he has lost weight recently.   Fall risk assessment completed and given to patient.  Patient is a high fall risk, stated his feet are painful at times.   Adonis Housekeeper has black belt, white tennis shoes, $40.00 (two $20.00 bills).   Patient did not want to answer questions, became quiet and was taken to his room, given food/drink. Patient stated he does have his own place to live here in [place], but plans to move back to [place] to be with family after discharge from [facility].

## 2015-04-13 NOTE — Tx Team (Signed)
Initial Interdisciplinary Treatment Plan   PATIENT STRESSORS: Financial difficulties Medication change or noncompliance Substance abuse   PATIENT STRENGTHS: Ability for insight Average or above average intelligence Capable of independent living Communication skills General fund of knowledge Motivation for treatment/growth Physical Health Supportive family/friends   PROBLEM LIST: Problem List/Patient Goals Date to be addressed Date deferred Reason deferred Estimated date of resolution  "anxiety" 04/13/2015   D/c  "depression" 04/13/2015   D/c  "substance abuse" 04/13/2015   D/c  "suicidal thoughts" 04/13/2015   D/c                                 DISCHARGE CRITERIA:  Ability to meet basic life and health needs Adequate post-discharge living arrangements Improved stabilization in mood, thinking, and/or behavior Medical problems require only outpatient monitoring Motivation to continue treatment in a less acute level of care Need for constant or close observation no longer present Reduction of life-threatening or endangering symptoms to within safe limits Safe-care adequate arrangements made Verbal commitment to aftercare and medication compliance Withdrawal symptoms are absent or subacute and managed without 24-hour nursing intervention  PRELIMINARY DISCHARGE PLAN: Attend aftercare/continuing care group Attend PHP/IOP Attend 12-step recovery group Outpatient therapy Participate in family therapy Return to previous living arrangement  PATIENT/FAMIILY INVOLVEMENT: This treatment plan has been presented to and reviewed with the patient, Milton Ferguson.  The patient and family have been given the opportunity to ask questions and make suggestions.  Cammy Copa 04/13/2015, 5:18 PM

## 2015-04-14 ENCOUNTER — Encounter (HOSPITAL_COMMUNITY): Payer: Self-pay | Admitting: Psychiatry

## 2015-04-14 DIAGNOSIS — F250 Schizoaffective disorder, bipolar type: Principal | ICD-10-CM

## 2015-04-14 DIAGNOSIS — F1414 Cocaine abuse with cocaine-induced mood disorder: Secondary | ICD-10-CM | POA: Diagnosis present

## 2015-04-14 DIAGNOSIS — F10129 Alcohol abuse with intoxication, unspecified: Secondary | ICD-10-CM

## 2015-04-14 NOTE — BHH Group Notes (Signed)
The focus of this group is to educate the patient on the purpose and policies of crisis stabilization and provide a format to answer questions about their admission.  The group details unit policies and expectations of patients while admitted.  Patient did not attend 0900 nurse education orientation group this morning.  Patient stayed in bed.   

## 2015-04-14 NOTE — Progress Notes (Signed)
Patient ID: Keith Murray, male   DOB: 17-Nov-1960, 55 y.o.   MRN: [id number]  DAR: Pt. Denies SI/HI and A/V Hallucinations. He reports sleep is good, appetite is good, energy level is normal, and concentration is good. He rates depression and hopelessness 5/10 and hopelessness 0/10. Patient does not report any pain or discomfort at this time. Support and encouragement provided to the patient. Scheduled medications administered to patient per physician's orders. Patient is receptive and cooperative.  He is seen in the milieu mostly staying to himself and walking the halls. Q15 minute checks are maintained for safety.

## 2015-04-14 NOTE — BHH Group Notes (Signed)
[place] LCSW Group Therapy  04/14/2015 1:06 PM  Type of Therapy:  Group Therapy  Participation Level:  Active  Participation Quality:  Attentive  Affect:  Appropriate  Cognitive:  Alert and Oriented  Insight:  Engaged  Engagement in Therapy:  Engaged  Modes of Intervention:  Discussion, Education, Exploration, Problem-solving, Rapport Building, Socialization and Support  Summary of Progress/Problems: [facility] Speaker came to talk about his personal journey with substance abuse and addiction. The pt processed ways by which to relate to the speaker. [place] speaker provided handouts and educational information pertaining to groups and [facility].   Smart, Jadin Creque LCSW 04/14/2015, 1:06 PM

## 2015-04-14 NOTE — BHH Suicide Risk Assessment (Signed)
[facility] INPATIENT:  Family/Significant Other Suicide Prevention Education  Suicide Prevention Education:  Education Completed; Kailand Seda (pt's niece) [phone] has been identified by the patient as the family member/significant other with whom the patient will be residing, and identified as the person(s) who will aid the patient in the event of a mental health crisis (suicidal ideations/suicide attempt).  With written consent from the patient, the family member/significant other has been provided the following suicide prevention education, prior to the and/or following the discharge of the patient.  The suicide prevention education provided includes the following:  Suicide risk factors  Suicide prevention and interventions  National Suicide Hotline telephone number  [facility] assessment telephone number  [facility] Emergency Assistance Ali Molina and/or Residential Mobile Crisis Unit telephone number  Request made of family/significant other to:  Remove weapons (e.g., guns, rifles, knives), all items previously/currently identified as safety concern.    Remove drugs/medications (over-the-counter, prescriptions, illicit drugs), all items previously/currently identified as a safety concern.  The family member/significant other verbalizes understanding of the suicide prevention education information provided.  The family member/significant other agrees to remove the items of safety concern listed above.  Smart, Carianna Lague LCSW 04/14/2015, 3:12 PM

## 2015-04-14 NOTE — BHH Counselor (Signed)
Adult Comprehensive Assessment  Patient ID: Alain Deschene, male   DOB: 1960/10/04, 55 y.o.   MRN: [id number]  Information Source: Information source: Patient  Current Stressors:  Educational / Learning stressors: Denies Employment / Job issues: Denies Family Relationships: Denies Museum/gallery curator / Lack of resources (include bankruptcy): Denies Housing / Lack of housing: Denies Physical health (include injuries & life threatening diseases): Denies Social relationships: Denies Substance abuse: Denies Bereavement / Loss: lost 3 siblings and parents.   Living/Environment/Situation:  Living Arrangements: Alone Living conditions (as described by patient or guardian): [place]  How long has patient lived in current situation?: several years What is atmosphere in current home: Supportive;Loving;Comfortable (Safe)  Family History:  Marital status: Single but "I have a friend that is male in church."  Does patient have children?: Yes- One 21 yo daughter. "She lives in [place]. I have a good relationship with her and her mother."   Childhood History:  By whom was/is the patient raised?: Both parents Description of patient's relationship with caregiver when they were a child: Great Patient's description of current relationship with people who raised him/her: Deceased Does patient have siblings?: Yes Number of Siblings: 23 (Twin brother killed, 50 sisters (2 deceased)) Description of patient's current relationship with siblings: Twin brother was killed on a motorcycle when he was 21. Had 7 sisters, 2 of whom are deceased. Fair relationship with remaining sisters. Did patient suffer any verbal/emotional/physical/sexual abuse as a child?: No Did patient suffer from severe childhood neglect?: No Has patient ever been sexually abused/assaulted/raped as an adolescent or adult?: No Was the patient ever a victim of a crime or a disaster?: No Witnessed domestic violence?:  Yes Has patient been effected by domestic violence as an adult?: No Description of domestic violence: outside of the family  Education:  Highest grade of school patient has completed: 12 (class of 57) Currently a student?: No Learning disability?: No  Employment/Work Situation:  Employment situation: On disability and works Architect. 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  Does patient have a representative payee or guardian?: Yes Name of representative payee or guardian: Niece Lum Babe is representative payee  Alcohol/Substance Abuse:  What has been your use of drugs/alcohol within the last 12 months?: denies substance abuse-positive for benzos, cocaine and THC. If attempted suicide, did drugs/alcohol play a role in this?: No Alcohol/Substance Abuse Treatment Hx: Past Tx, Inpatient;Past Tx, Outpatient;Attends AA/NA. [place] in 2015 hx of schizoaffective disorder.  If yes, describe treatment: in [place] "awhile back" Has alcohol/substance abuse ever caused legal problems?: No past dui in [place] years ago. No current issues/legal problems.   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:  Leisure and Hobbies: Swim,  play sports, fish, cut grass, wash cars, cleaning, mopping, sweeping,  dishes  Strengths/Needs:  What things does the patient do well?: Swim, good at sports, fish, cut grass, wash cars, cleaning, mopping, sweeping, dishes In what areas does patient struggle / problems for patient: Getting along with the nieces  Discharge Plan:  Does patient have access to transportation?: Yes Will patient be returning to same living situation after discharge?: yes "I need to get back to my house." Currently receiving community mental health services: Yes (From Whom) Beverly Sessions (gets injections) If no, would patient like referral for services when discharged?: No-"I want to go back to [place]."  Does patient have financial barriers related to discharge medications?: No    Summary/Recommendations:   Summary and Recommendations (to be completed by the evaluator): Ciaran is 55 year old male living in [place], [place]. He presents to the hospital after police were concerned with his behavior and brought him for assessment. Pt has history of schizoaffective disorder. 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 Pt plans to return home and continue follow-up at [facility]. He denies SI/HI/AVH. Recommendations for pt include: crisis stabilization, therapeutic milieu, encourage group attendance and participation, medication management for mood stabilization, and development of comprehensive mental wellness/sobriety plan.   Smart, Jeydi Klingel LCSW 04/14/2015 10:39 AM

## 2015-04-14 NOTE — BHH Group Notes (Signed)
During wrap up group, patient stated he is discharging tomorrow.  His goal was to take his medications.  He stated he had an overall great day.   Keith Murray, MHT

## 2015-04-14 NOTE — Progress Notes (Signed)
NUTRITION ASSESSMENT  Pt identified as at risk on the Malnutrition Screen Tool  INTERVENTION: 1. Educated patient on the importance of nutrition and encouraged intake of food and beverages. 2. Discussed weight goals. 3. Supplements: continue Ensure Enlive po BID, each supplement provides 350 kcal and 20 grams of protein   NUTRITION DIAGNOSIS: Unintentional weight loss related to sub-optimal intake as evidenced by pt report.   Goal: Pt to meet >/= 90% of their estimated nutrition needs.  Monitor:  PO intake  Assessment:  Pt seen for MST. Pt admitted with psychosis. Pt using drugs and drinking alcohol the day before admission. He states that he has been losing weight over the past few months. Per weight hx review, pt has lost 39 lbs (20% body weight) in the past 5 months which is significant for time frame.   Ensure Jeanne Ivan has already been ordered BID; continue this.   55 y.o. male  Height: Ht Readings from Last 1 Encounters:  04/13/15 '6\' 2"'$  (1.88 m)    Weight: Wt Readings from Last 1 Encounters:  04/13/15 154 lb (69.854 kg)    Weight Hx: Wt Readings from Last 10 Encounters:  04/13/15 154 lb (69.854 kg)  10/28/14 193 lb (87.544 kg)  07/23/14 193 lb (87.544 kg)  06/09/14 183 lb (83.008 kg)  06/08/13 173 lb (78.472 kg)  10/07/12 158 lb (71.668 kg)  08/03/12 153 lb (69.4 kg)    BMI:  Body mass index is 19.76 kg/(m^2). Pt meets criteria for normal weight based on current BMI.  Estimated Nutritional Needs: Kcal: 25-30 kcal/kg Protein: > 1 gram protein/kg Fluid: 1 ml/kcal  Diet Order: Diet Heart Room service appropriate?: Yes; Fluid consistency:: Thin Pt is also offered choice of unit snacks mid-morning and mid-afternoon.  Pt is eating as desired.   Lab results and medications reviewed.      Jarome Matin, RD, LDN Inpatient Clinical Dietitian Pager # [phone] After hours/weekend pager # [phone]

## 2015-04-14 NOTE — H&P (Signed)
Psychiatric Admission Assessment Adult  Patient Identification: Keith Murray MRN:  [id number] Date of Evaluation:  04/14/2015 Chief Complaint:  SCHIZOAFFECTIVE DISORDER,BIPOLAR TYPE ALCOHOL USE DISORDER Principal Diagnosis: <principal problem not specified> Diagnosis:   Patient Active Problem List   Diagnosis Date Noted  . Psychoses [F29]   . Schizoaffective disorder, bipolar type ([place]) [F25.0]   . Alcohol dependence ([place]) [F10.20] 06/08/2013  . Polysubstance (excluding opioids) dependence ([place]) [F19.20] 08/08/2012  . Psychosis [F29] 08/04/2012   History of Present Illness:: 55 Y/O male who states he was working and one of the bosses was yelling at him. He told him not to yell at him. States the next thing he knows is that the police picked him up. He initially denied alcohol use or substance abuse but when told he had a high alcohol level and UDS pos for cocaine he admitted he drank a smoked pot. He receives treatment at [facility]. He receives a Haldol injection. He did admit that when he keeps up with his medications he does not experience symptoms The initial assessment was as follows: Keith Murray is an 55 y.o. male was brought to [facility] by the police after they noticed him acting peculiar. Pt originally reported that he would not answer any of the writer's questions but did open up after a few minutes. Pt reports that he was working and the police just picked him up for no reason. Pt reports A/V hallucinations when he does not take his medication that he gets from [facility]. Pt reports being compliant with his medications and lives alone. Pt could not say if he had an ACTT team or not. Pt denied SI, HI, any current substance use, depression, and anxiety. Pt was pleasant for most of the assessment and reported that he was fine because he could smile.   The next day reassessment as follows: Today: Patient had been abuse alcohol and drugs last night when the police brought him to the ED for acting  strangely. He reports not taking his medications for the past 3 days and angry towards [facility]. Patient not stable at this time, auditory and visual hallucinations with homicidal ideations towards [facility] but no plan or intent.  Associated Signs/Symptoms: Depression Symptoms:  denies (Hypo) Manic Symptoms:  denies Anxiety Symptoms:  denies Psychotic Symptoms:  denies current symptoms  states he does well when he takes his medications PTSD Symptoms: Negative Total Time spent with patient: 45 minutes  Past Psychiatric History:   Risk to Self: Is patient at risk for suicide?: No Risk to Others:  No Prior Inpatient Therapy:  [place] when he lost parents siblings.  Prior Outpatient Therapy:  [facility]   Alcohol Screening: 1. How often do you have a drink containing alcohol?: 4 or more times a week 2. How many drinks containing alcohol do you have on a typical day when you are drinking?: 10 or more 3. How often do you have six or more drinks on one occasion?: Daily or almost daily Preliminary Score: 8 4. How often during the last year have you found that you were not able to stop drinking once you had started?: Daily or almost daily 5. How often during the last year have you failed to do what was normally expected from you becasue of drinking?: Daily or almost daily 6. How often during the last year have you needed a first drink in the morning to get yourself going after a heavy drinking session?: Daily or almost daily 7. How often during the last year  have you had a feeling of guilt of remorse after drinking?: Daily or almost daily 8. How often during the last year have you been unable to remember what happened the night before because you had been drinking?: Daily or almost daily 9. Have you or someone else been injured as a result of your drinking?: No 10. Has a relative or friend or a doctor or another health worker been concerned about your drinking or suggested you cut down?:  No Alcohol Use Disorder Identification Test Final Score (AUDIT): 32 Brief Intervention: Yes Substance Abuse History in the last 12 months:  Yes.   Consequences of Substance Abuse: Legal Consequences:  one DWI Previous Psychotropic Medications: Yes  Psychological Evaluations: No  Past Medical History:  Past Medical History  Diagnosis Date  . Anxiety   . Schizoaffective disorder ([place])   . Polysubstance abuse     cocaine, etoh, marijuana  . Psychosis     Past Surgical History  Procedure Laterality Date  . Hernia repair     Family History: History reviewed. No pertinent family history. Family Psychiatric  History: oldest sister "real bad drinker. " Social History:  History  Alcohol Use  . Yes    Comment: Once every 3 months or so.      History  Drug Use  . Yes  . Special: Cocaine, Marijuana    Comment: THC about once per week and cocaine on occasion.     Social History   Social History  . Marital Status: Single    Spouse Name: N/A  . Number of Children: N/A  . Years of Education: N/A   Social History Main Topics  . Smoking status: Current Every Day Smoker -- 0.50 packs/day for 4 years  . Smokeless tobacco: None  . Alcohol Use: Yes     Comment: Once every 3 months or so.   . Drug Use: Yes    Special: Cocaine, Marijuana     Comment: THC about once per week and cocaine on occasion.   . Sexual Activity: Yes    Birth Control/ Protection: Condom   Other Topics Concern  . None   Social History Narrative  Has been living  by himself for 3 years. Has one daughter  28 in [place]. 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  Metabolic Disorder Labs:  No results found for: HGBA1C, MPG No results found for: PROLACTIN No results found for:  CHOL, TRIG, HDL, CHOLHDL, VLDL, LDLCALC  Current Medications: Current Facility-Administered Medications  Medication Dose Route Frequency Provider Last Rate Last Dose  . acetaminophen (TYLENOL) tablet 650 mg  650 mg Oral Q6H PRN Patrecia Pour, NP      . alum & mag hydroxide-simeth (MAALOX/MYLANTA) 200-200-20 MG/5ML suspension 30 mL  30 mL Oral Q4H PRN Patrecia Pour, NP      . benztropine (COGENTIN) tablet 0.5 mg  0.5 mg Oral BID Patrecia Pour, NP   0.5 mg at 04/14/15 0809  . chlordiazePOXIDE (LIBRIUM) capsule 25 mg  25 mg Oral Q6H PRN Patrecia Pour, NP      . feeding supplement (ENSURE ENLIVE) (ENSURE ENLIVE) liquid 237 mL  237  mL Oral BID BM Nicholaus Bloom, MD   237 mL at 04/13/15 1710  . FLUoxetine (PROZAC) capsule 20 mg  20 mg Oral Daily Patrecia Pour, NP   20 mg at 04/14/15 0809  . haloperidol (HALDOL) tablet 5 mg  5 mg Oral BID Patrecia Pour, NP   5 mg at 04/14/15 0809  . hydrOXYzine (ATARAX/VISTARIL) tablet 25 mg  25 mg Oral Q6H PRN Patrecia Pour, NP      . Influenza vac split quadrivalent PF (FLUARIX) injection 0.5 mL  0.5 mL Intramuscular Tomorrow-1000 Nicholaus Bloom, MD      . lisinopril (PRINIVIL,ZESTRIL) tablet 10 mg  10 mg Oral Daily Patrecia Pour, NP   10 mg at 04/14/15 0809  . loperamide (IMODIUM) capsule 2-4 mg  2-4 mg Oral PRN Patrecia Pour, NP      . LORazepam (ATIVAN) injection 2 mg  2 mg Intramuscular Q4H PRN Patrecia Pour, NP      . magnesium hydroxide (MILK OF MAGNESIA) suspension 30 mL  30 mL Oral Daily PRN Patrecia Pour, NP      . multivitamin with minerals tablet 1 tablet  1 tablet Oral Daily Patrecia Pour, NP   1 tablet at 04/14/15 0809  . nicotine (NICODERM CQ - dosed in mg/24 hours) patch 21 mg  21 mg Transdermal Daily Nicholaus Bloom, MD   21 mg at 04/14/15 4742  . ondansetron (ZOFRAN-ODT) disintegrating tablet 4 mg  4 mg Oral Q6H PRN Patrecia Pour, NP      . thiamine (VITAMIN B-1) tablet 100 mg  100 mg Oral Daily Patrecia Pour, NP   100 mg at 04/14/15 0809   . traZODone (DESYREL) tablet 100 mg  100 mg Oral QHS Patrecia Pour, NP   100 mg at 04/13/15 2109  . ziprasidone (GEODON) injection 20 mg  20 mg Intramuscular Q6H PRN Patrecia Pour, NP       PTA Medications: Prescriptions prior to admission  Medication Sig Dispense Refill Last Dose  . benztropine (COGENTIN) 0.5 MG tablet Take 1 tablet (0.5 mg total) by mouth 2 (two) times daily. 60 tablet 0 04/10/2015  . FLUoxetine (PROZAC) 20 MG capsule Take 1 capsule (20 mg total) by mouth daily. 30 capsule 0 04/10/2015  . haloperidol (HALDOL) 5 MG tablet Take 1 tablet (5 mg total) by mouth 2 (two) times daily. 60 tablet 0 04/10/2015  . lisinopril (PRINIVIL,ZESTRIL) 10 MG tablet Take 1 tablet (10 mg total) by mouth daily. 30 tablet 0 04/10/2015  . nicotine (NICODERM CQ - DOSED IN MG/24 HOURS) 21 mg/24hr patch Place 21 mg onto the skin daily.   04/10/2015  . traZODone (DESYREL) 100 MG tablet Take 1 tablet (100 mg total) by mouth at bedtime. 30 tablet 0 04/11/2015  . Multiple Vitamin (MULTIVITAMIN WITH MINERALS) TABS tablet Take 1 tablet by mouth daily. May purchase over the counter. (Patient not taking: Reported on 04/14/2015)   Past Month at Unknown time    Musculoskeletal: Strength & Muscle Tone: within normal limits Gait & Station: normal Patient leans: normal  Psychiatric Specialty Exam: Physical Exam  Review of Systems  Constitutional: Negative.   HENT: Negative.   Eyes: Negative.   Cardiovascular: Negative.        Pack will last couple of months  Gastrointestinal: Negative.   Genitourinary: Negative.   Skin: Negative.   Neurological: Negative.   Endo/Heme/Allergies: Negative.   Psychiatric/Behavioral: Positive for substance abuse.  Blood pressure 94/67, pulse 108, temperature 98.4 F (36.9 C), temperature source Oral, resp. rate 18, height '6\' 2"'$  (1.88 m), weight 69.854 kg (154 lb), SpO2 100 %.Body mass index is 19.76 kg/(m^2).  General Appearance: Fairly Groomed  Engineer, water::  Fair   Speech:  Clear and Coherent  Volume:  Normal  Mood:  Anxious  Affect:  Restricted  Thought Process:  Coherent and Goal Directed  Orientation:  Full (Time, Place, and Person)  Thought Content:  symptoms events worries concerns minimizes denies  Suicidal Thoughts:  No  Homicidal Thoughts:  No  Memory:  Immediate;   Fair Recent;   Fair Remote;   Fair  Judgement:  Fair  Insight:  Shallow  Psychomotor Activity:  Normal  Concentration:  Fair  Recall:  [place]  Language: Fair  Akathisia:  No  Handed:  Right  AIMS (if indicated):     Assets:  Desire for Improvement Housing  ADL's:  Intact  Cognition: WNL  Sleep:        Treatment Plan Summary: Daily contact with patient to assess and evaluate symptoms and progress in treatment and Medication management Supportive approach/coping skills Get collateral information Alcohol abuse; monitor for detox needs work a relapse prevention plan Cocaine abuse; work a relapse prevention plan Psychosis; continue the Haldol and encourage compliance Work with CBT/mindfulness Observation Level/Precautions:  15 minute checks  Laboratory:  As per the ED and Lipid panel, prolactin level and Hemoglobin A 1 C  Psychotherapy:  Individual/group  Medications:  Will reassess for S/S of withdrawal and address accordingly  Consultations:    Discharge Concerns:    Estimated LOS: 3-5 days  Other:     I certify that inpatient services furnished can reasonably be expected to improve the patient's condition.   Garwood A 1/17/201710:26 AM

## 2015-04-14 NOTE — Tx Team (Signed)
Interdisciplinary Treatment Plan Update (Adult)  Date:  04/14/2015  Time Reviewed:  8:33 AM   Progress in Treatment: Attending groups: No. new to unit. Continuing to assess.  Participating in groups:  Yes. Taking medication as prescribed:  Yes. Tolerating medication:  Yes. Family/Significant othe contact made:  SPE required for this pt.  Patient understands diagnosis:  No. Pt does not understand why police brought him to the hospital. Lack of insight at this time.  Discussing patient identified problems/goals with staff:  Yes. Medical problems stabilized or resolved:  Yes. Denies suicidal/homicidal ideation: Yes. Issues/concerns per patient self-inventory:  Other:  Discharge Plan or Barriers: Pt reports that he lives alone and follows up at [facility] for medication management.   Reason for Continuation of Hospitalization: Hallucinations Medication stabilization  Comments:  Keith Murray is an 55 y.o. male was brought to [facility] by the police after they noticed him acting peculiar. Pt originally reported that he would not answer any of the writer's questions but did open up after a few minutes. Pt reports that he was working and the police just picked him up for no reason. Pt reports A/V hallucinations when he does not take his medication that he gets from [facility]. Pt reports being compliant with his medications and lives alone. Pt could not say if he had an ACTT team or not. Pt denied SI, HI, any current substance use, depression, and anxiety. Pt was pleasant for most of the assessment and reported that he was fine because he could smile.  Diagnosis: F25.1 Schizoaffective disorder  Estimated length of stay:  3-5 days   New goal(s): to develop effective aftercare plan.   Additional Comments:  Patient and CSW reviewed pt's identified goals and treatment plan. Patient verbalized understanding and agreed to treatment plan. CSW reviewed [facility] "Discharge Process and Patient Involvement" Form. Pt  verbalized understanding of information provided and signed form.    Review of initial/current patient goals per problem list:  1. Goal(s): Patient will participate in aftercare plan  DJS:HFWY Progressing    Target date: at discharge  As evidenced by: Patient will participate within aftercare plan AEB aftercare provider and housing plan at discharge being identified.  1/17: Pt plans to return home and follow up at [facility]. CSW assessing. Pt unsure if he has an ACT team.   2. Goal (s): Patient will exhibit decreased depressive symptoms and suicidal ideations.  Met: Yes   Target date: at discharge  As evidenced by: Patient will utilize self rating of depression at 3 or below and demonstrate decreased signs of depression or be deemed stable for discharge by MD.  1/17: Pt rates depression as low and denies SI/HI/AVH. Pt covers face with covers; continues to demonstrate bizarre behavior.   4. Goal(s): Patient will demonstrate decreased signs of withdrawal due to substance abuse  Met:No.   Target date:at discharge   As evidenced by: Patient will produce a CIWA/COWS score of 0, have stable vitals signs, and no symptoms of withdrawal.  1/17: Pt denies substance abuse. UDS positive for cocaine, benzos, and THC. COWs of 4 as of yesterday.   Attendees: Patient:   04/14/2015 8:33 AM   Family:   04/14/2015 8:33 AM   Physician:  Dr. Carlton Adam, MD 04/14/2015 8:33 AM   Nursing:   Everlean Cherry RN 04/14/2015 8:33 AM   Clinical Social Worker: Maxie Better, LCSW 04/14/2015 8:33 AM   Clinical Social Worker: Erasmo Downer Drinkard LCSWA; Peri Maris LCSWA 04/14/2015 8:33 AM   Other:  Anderson Malta  Loletha Grayer Nurse Case Manager 04/14/2015 8:33 AM   Other: Agustina Caroli NP  04/14/2015 8:33 AM   Other:   04/14/2015 8:33 AM   Other:  04/14/2015 8:33 AM   Other:  04/14/2015 8:33 AM   Other:  04/14/2015 8:33 AM    04/14/2015 8:33 AM    04/14/2015 8:33 AM    04/14/2015 8:33 AM    04/14/2015 8:33 AM    Scribe for  Treatment Team:   Maxie Better, LCSW 04/14/2015 8:33 AM

## 2015-04-14 NOTE — Progress Notes (Signed)
Recreation Therapy Notes  Animal-Assisted Activity (AAA) Program Checklist/Progress Notes Patient Eligibility Criteria Checklist & Daily Group note for Rec Tx Intervention  Date: 01.17.2017 Time: 2:45pm Location: [address]    AAA/T Program Assumption of Risk Form signed by Patient/ or Parent Legal Guardian yes  Patient is free of allergies or sever asthma yes  Patient reports no fear of animals yes  Patient reports no history of cruelty to animals yes  Patient understands his/her participation is voluntary yes  Behavioral Response: Did not attend   [employer], LRT/CTRS  [place], Shaniece Bussa L 04/14/2015 2:50 PM

## 2015-04-14 NOTE — BHH Suicide Risk Assessment (Signed)
[facility] Admission Suicide Risk Assessment   Nursing information obtained from:  Patient Demographic factors:  Male, Low socioeconomic status Current Mental Status:  NA Loss Factors:  Financial problems / change in socioeconomic status Historical Factors:  NA Risk Reduction Factors:  Positive therapeutic relationship, NA  Total Time spent with patient: 45 minutes Principal Problem: Schizoaffective disorder, bipolar type ([place]) Diagnosis:   Patient Active Problem List   Diagnosis Date Noted  . Alcohol abuse with intoxication ([place]) [F10.129] 04/14/2015  . Cocaine abuse with cocaine-induced mood disorder ([place]) [F14.14] 04/14/2015  . Psychoses [F29]   . Schizoaffective disorder, bipolar type ([place]) [F25.0]   . Alcohol dependence ([place]) [F10.20] 06/08/2013  . Polysubstance (excluding opioids) dependence ([place]) [F19.20] 08/08/2012  . Psychosis [F29] 08/04/2012   Subjective Data: see admission H and P  Continued Clinical Symptoms:  Alcohol Use Disorder Identification Test Final Score (AUDIT): 32 The "Alcohol Use Disorders Identification Test", Guidelines for Use in Primary Care, Second Edition.  World Pharmacologist [facility]). Score between 0-7:  no or low risk or alcohol related problems. Score between 8-15:  moderate risk of alcohol related problems. Score between 16-19:  high risk of alcohol related problems. Score 20 or above:  warrants further diagnostic evaluation for alcohol dependence and treatment.   CLINICAL FACTORS:   Schizoaffective Disorder/alcohol drug abuse   Psychiatric Specialty Exam: ROS  Blood pressure 94/67, pulse 108, temperature 98.4 F (36.9 C), temperature source Oral, resp. rate 18, height '6\' 2"'$  (1.88 m), weight 69.854 kg (154 lb), SpO2 100 %.Body mass index is 19.76 kg/(m^2).   COGNITIVE FEATURES THAT CONTRIBUTE TO RISK:  Closed-mindedness, Polarized thinking and Thought constriction (tunnel vision)    SUICIDE RISK:   Mild:  Suicidal ideation of limited  frequency, intensity, duration, and specificity.  There are no identifiable plans, no associated intent, mild dysphoria and related symptoms, good self-control (both objective and subjective assessment), few other risk factors, and identifiable protective factors, including available and accessible social support.  PLAN OF CARE: see admission H and P  I certify that inpatient services furnished can reasonably be expected to improve the patient's condition.   Nicholaus Bloom, MD 04/14/2015, 5:48 PM

## 2015-04-15 MED ORDER — TRAZODONE HCL 100 MG PO TABS: 100.0000 mg | ORAL_TABLET | Freq: Every day | ORAL | Status: DC

## 2015-04-15 MED ORDER — HALOPERIDOL 5 MG PO TABS: 5.0000 mg | ORAL_TABLET | Freq: Two times a day (BID) | ORAL | Status: DC

## 2015-04-15 MED ORDER — NICOTINE 21 MG/24HR TD PT24: 21.0000 mg | MEDICATED_PATCH | Freq: Every day | TRANSDERMAL | Status: DC

## 2015-04-15 MED ORDER — LISINOPRIL 10 MG PO TABS: 10.0000 mg | ORAL_TABLET | Freq: Every day | ORAL | Status: DC

## 2015-04-15 MED ORDER — ADULT MULTIVITAMIN W/MINERALS CH: 1.0000 | ORAL_TABLET | Freq: Every day | ORAL | Status: DC

## 2015-04-15 MED ORDER — BENZTROPINE MESYLATE 0.5 MG PO TABS: 0.5000 mg | ORAL_TABLET | Freq: Two times a day (BID) | ORAL | Status: DC

## 2015-04-15 MED ORDER — HYDROXYZINE HCL 25 MG PO TABS: ORAL_TABLET | ORAL | Status: DC

## 2015-04-15 MED ORDER — FLUOXETINE HCL 20 MG PO CAPS: 20.0000 mg | ORAL_CAPSULE | Freq: Every day | ORAL | Status: DC

## 2015-04-15 NOTE — BHH Suicide Risk Assessment (Signed)
[facility] Discharge Suicide Risk Assessment   Principal Problem: Schizoaffective disorder, bipolar type [facility]) Discharge Diagnoses:  Patient Active Problem List   Diagnosis Date Noted  . Alcohol abuse with intoxication ([place]) [F10.129] 04/14/2015  . Cocaine abuse with cocaine-induced mood disorder ([place]) [F14.14] 04/14/2015  . Psychoses [F29]   . Schizoaffective disorder, bipolar type ([place]) [F25.0]   . Alcohol dependence ([place]) [F10.20] 06/08/2013  . Polysubstance (excluding opioids) dependence ([place]) [F19.20] 08/08/2012  . Psychosis [F29] 08/04/2012    Total Time spent with patient: 20 minutes  Musculoskeletal: Strength & Muscle Tone: within normal limits Gait & Station: normal Patient leans: normal  Psychiatric Specialty Exam: Review of Systems  Constitutional: Negative.   HENT: Negative.   Eyes: Negative.   Respiratory: Negative.   Cardiovascular: Negative.   Gastrointestinal: Negative.   Genitourinary: Negative.   Musculoskeletal: Negative.   Skin: Negative.   Neurological: Negative.   Endo/Heme/Allergies: Negative.   Psychiatric/Behavioral: Positive for substance abuse.    Blood pressure 106/63, pulse 91, temperature 97.7 F (36.5 C), temperature source Oral, resp. rate 16, height '6\' 2"'$  (1.88 m), weight 69.854 kg (154 lb), SpO2 100 %.Body mass index is 19.76 kg/(m^2).  General Appearance: Fairly Groomed  Engineer, water::  Fair  Speech:  Clear and [id number]  Volume:  Normal  Mood:  Euthymic  Affect:  Appropriate  Thought Process:  Coherent and Goal Directed  Orientation:  Full (Time, Place, and Person)  Thought Content:  plans as he moves on, relapse prevention plan  Suicidal Thoughts:  No  Homicidal Thoughts:  No  Memory:  Immediate;   Fair Recent;   Fair Remote;   Fair  Judgement:  Fair  Insight:  Present  Psychomotor Activity:  Normal  Concentration:  Fair  Recall:  [employer] [place]  Language: Fair  Akathisia:  No  Handed:  Right  AIMS (if  indicated):     Assets:  Desire for Improvement Housing Social Support  Sleep:  Number of Hours: 6.25  Cognition: WNL  ADL's:  Intact   Mental Status Per Nursing Assessment::   On Admission:  NA [place]lin is in full contact with reality. There are no active S/S of withdrawal. There are no active SI plans or intent. He states he is committed to abstinence. States he knows he needs to avoid certain people and states he is ready to do that. Has the support of his boss who states that he runs them out when they come by his work site Demographic Factors:  Male  Loss Factors: NA  Historical Factors: NA  Risk Reduction Factors:   Sense of responsibility to family, Employed and Positive social support  Continued Clinical Symptoms:  Alcohol/Substance Abuse/Dependencies, Schizoaffective Disorder  Cognitive Features That Contribute To Risk:  Closed-mindedness, Polarized thinking and Thought constriction (tunnel vision)    Suicide Risk:  Minimal: No identifiable suicidal ideation.  Patients presenting with no risk factors but with morbid ruminations; may be classified as minimal risk based on the severity of the depressive symptoms  Follow-up Information    Follow up with [facility].   Why:  If appt not already scheduled: Walk in between 8am-9am Monday through Friday for hospital follow-up/medication management/assessment for counseling services.    Contact information:   [address] [place] [address] Phone: [phone] Fax: [phone]     Follow up as above Plan Of Care/Follow-up recommendations:  Activity:  as tolerated Diet:  regular Follow up as above Jordyn Doane A, MD 04/15/2015, 10:11 AM

## 2015-04-15 NOTE — Progress Notes (Signed)
  [facility] Adult Case Management Discharge Plan :  Will you be returning to the same living situation after discharge:  Yes,  home At discharge, do you have transportation home?: Yes,  neice will come at 5pm to pick him up Do you have the ability to pay for your medications: Yes,  [employer]/medicaid  Release of information consent forms completed and submitted to medical records by CSW.  Patient to Follow up at: Follow-up Information    Follow up with [facility].   Why:  If appt not already scheduled: Walk in between 8am-9am Monday through Friday for hospital follow-up/medication management/assessment for counseling services.    Contact information:   [address], [place] [address] Phone: [phone] Fax: [phone]      Next level of care provider has access to [place] and Suicide Prevention discussed: Yes,  SPE completed with pt's neice. SPI pamphlet and mobile crisis information provided to pt.   Have you used any form of tobacco in the last 30 days? (Cigarettes, Smokeless Tobacco, Cigars, and/or Pipes): Yes  Has patient been referred to the Quitline?: Patient refused referral  Patient has been referred for addiction treatment: Pt. refused referral  Smart, Delphia Kaylor LCSW 04/15/2015, 9:55 AM

## 2015-04-15 NOTE — Discharge Summary (Signed)
Physician Discharge Summary Note  Patient:  Keith Murray is an 55 y.o., male  MRN:  [id number]  DOB:  19-Sep-1960  Patient phone:  [phone] (home)   Patient address:   [address] [place] [address],   Total Time spent with patient: Greater than 30 minutes  Date of Admission:  04/13/2015  Date of Discharge: 04-15-15  Reason for Admission:  Alcohol abuse, Depressive symptoms  Discharge Diagnoses: Principal Problem:   Schizoaffective disorder, bipolar type ([place]) Active Problems:   Alcohol abuse with intoxication ([place])   Cocaine abuse with cocaine-induced mood disorder [facility])  Psychiatric Specialty Exam: Physical Exam  Constitutional: He is oriented to person, place, and time. He appears well-developed and well-nourished.  HENT:  Head: Normocephalic.  Eyes: Pupils are equal, round, and reactive to light.  Neck: Normal range of motion.  Cardiovascular: Normal rate.   Respiratory: Effort normal.  GI: Soft.  Genitourinary:  Denies any issues in this area  Musculoskeletal: Normal range of motion.  Neurological: He is alert and oriented to person, place, and time.  Skin: Skin is warm and dry.  Psychiatric: He has a normal mood and affect. His speech is normal and behavior is normal. Judgment and thought content normal. Cognition and memory are normal.    Review of Systems  Constitutional: Negative.   HENT: Negative.   Eyes: Negative.   Respiratory: Negative.   Cardiovascular: Negative.   Gastrointestinal: Negative.   Genitourinary: Negative.   Musculoskeletal: Negative.   Skin: Negative.   Neurological: Negative.   Endo/Heme/Allergies: Negative.   Psychiatric/Behavioral: Positive for depression (Stable) and substance abuse (Alcohol & cocaine dependence). Negative for suicidal ideas, hallucinations and memory loss. The patient has insomnia (Stable). The patient is not nervous/anxious.     Blood pressure 106/63, pulse 91, temperature 97.7 F (36.5 C), temperature  source Oral, resp. rate 16, height '6\' 2"'  (1.88 m), weight 69.854 kg (154 lb), SpO2 100 %.Body mass index is 19.76 kg/(m^2).  See Md's SRA   Past Psychiatric History:   Musculoskeletal: Strength & Muscle Tone: within normal limits Gait & Station: normal Patient leans: N/A  Past Medical History   Diagnosis  Date   .  Mental disorder    .  Anxiety    .  Bipolar disorder     Level of Care:  OP  Hospital Course:  55 Y/O male who states he was working and one of the bosses was yelling at him. He told him not to yell at him. States the next thing he knows is that the police picked him up. He initially denied alcohol use or substance abuse but when told he had a high alcohol level and UDS pos for cocaine he admitted he drank a smoked pot. He receives treatment at [facility] He receives a Haldol injection. He did admit that when he keeps up with his medications he does not experience symptoms.  Upon his arrival & admision to the adult unit, Keith Murray was evaluated & his presenting symptoms identified. His lab results showed a blood alcohol level of 248 & UDS positive for Benzodiazepine, cocaine & THC. However, he was not presenting with any substance withdrawal symptoms. He did not receive detoxification treatments as result, but received mood stabilization treatment. He was medicated & discharged on; Cogentin 0.5 mg for prevention of EPS, Fluoxetine 20 mg for depression, haldol 5 mg for mood control & Trazodone 100 mg for insomnia. He was enrolled & encouraged to participate in the unit programming, he did &  learned coping skills. He presented other significant health issues that required treatment. He was resumed on all his pertinent home medications for those health issues. Keith Murray was evaluated on daily basis by the clinical providers to assure his response to his treatment regimen.As his treatment progressed, improvement was noted as evidenced by his report of decreasing symptoms, improved sleep, mood,  affect, medication tolerance & active participation in the unit programming. He was encouraged to update his providers on his progress by daily completion of a self inventory assessment, noting mood, mental status, pain, any new symptoms, anxiety and or concerns.  Keith Murray's symptoms responded well to his treatment regimen combined with a therapeutic and supportive environment. He was motivated for recovery as evidenced by a positive/appropriate behavior & his interaction with the staff & fellow patients.He also worked closely with the treatment team and case manager to develop a discharge plan with appropriate goals to maintain mood stability after discharge. Coping skills, problem solving as well as relaxation therapies were also part of the unit programming.  Upon his hospital discharge, Keith Murray was in much improved condition than upon admission.His symptoms were reported as significantly decreased or resolved completely. He adamantly denies any SIHI,  AVH, delusional thoughts & or paranoia. He was motivated to continue taking medication with a goal of continued improvement in mental health. He will continue psychiatric care on an outpatient basis as noted below. He is provided with all the necessary information required to make this appointment without problems. He [facility] with all personal belongings in no apparent distress. Transportation per niece.  Consults:  None and psychiatry  Discharge Vitals:   Blood pressure 106/63, pulse 91, temperature 97.7 F (36.5 C), temperature source Oral, resp. rate 16, height '6\' 2"'  (1.88 m), weight 69.854 kg (154 lb), SpO2 100 %. Body mass index is 19.76 kg/(m^2). Lab Results:   No results found for this or any previous visit (from the past 72 hour(s)).  Physical Findings:  AIMS: Facial and Oral Movements Muscles of Facial Expression: None, normal Lips and Perioral Area: None, normal Jaw: None, normal Tongue: None, normal,Extremity Movements Upper  (arms, wrists, hands, fingers): None, normal Lower (legs, knees, ankles, toes): None, normal, Trunk Movements Neck, shoulders, hips: None, normal, Overall Severity Severity of abnormal movements (highest score from questions above): None, normal Incapacitation due to abnormal movements: None, normal Patient's awareness of abnormal movements (rate only patient's report): No Awareness, Dental Status Current problems with teeth and/or dentures?: No Does patient usually wear dentures?: No  CIWA:  CIWA-Ar Total: 0 COWS:  COWS Total Score: 4  Psychiatric Specialty Exam: See Psychiatric Specialty Exam and Suicide Risk Assessment completed by Attending Physician prior to discharge.  Discharge destination:  Home  Is patient on multiple antipsychotic therapies at discharge:  No   Has Patient had three or more failed trials of antipsychotic monotherapy by history:  No  Recommended Plan for Multiple Antipsychotic Therapies: NA    Medication List    TAKE these medications      Indication   benztropine 0.5 MG tablet  Commonly known as:  COGENTIN  Take 1 tablet (0.5 mg total) by mouth 2 (two) times daily. For prevention of drug induced tremors   Indication:  Extrapyramidal Reaction caused by Medications     FLUoxetine 20 MG capsule  Commonly known as:  PROZAC  Take 1 capsule (20 mg total) by mouth daily. For depression   Indication:  Major Depressive Disorder     haloperidol 5 MG tablet  Commonly known as:  HALDOL  Take 1 tablet (5 mg total) by mouth 2 (two) times daily. For mood control   Indication:  Mood control     hydrOXYzine 25 MG tablet  Commonly known as:  ATARAX/VISTARIL  Take 1 tablet (25 mg) every 6 hours as needed: Foe anxiety/sleep   Indication:  Anxiety/insomnia     lisinopril 10 MG tablet  Commonly known as:  PRINIVIL,ZESTRIL  Take 1 tablet (10 mg total) by mouth daily. For high blood pressure   Indication:  High Blood Pressure     multivitamin with minerals Tabs  tablet  Take 1 tablet by mouth daily. May purchase over the counter: As a Vitamin supplement   Indication:  Vitamin Supplementation     nicotine 21 mg/24hr patch  Commonly known as:  NICODERM CQ - dosed in mg/24 hours  Place 1 patch (21 mg total) onto the skin daily. For smoking cessation   Indication:  Nicotine Addiction     traZODone 100 MG tablet  Commonly known as:  DESYREL  Take 1 tablet (100 mg total) by mouth at bedtime. For insomnia   Indication:  Trouble Sleeping       Follow-up Information    Follow up with [facility].   Why:  If appt not already scheduled: Walk in between 8am-9am Monday through Friday for hospital follow-up/medication management/assessment for counseling services.    Contact information:   [address] [place] [address] Phone: [phone] Fax: [phone]     Follow-up recommendations: Activity:  As tolerated Diet: As recommended by your primary care doctor. Keep all scheduled follow-up appointments as recommended.   Comments:  Take all your medications as prescribed by your mental healthcare provider.  Report any adverse effects and or reactions from your medicines to your outpatient provider promptly.  Patient is instructed and cautioned to not engage in alcohol and or illegal drug use while on prescription medicines.  In the event of worsening symptoms, patient is instructed to call the crisis hotline, 911 and or go to the nearest ED for appropriate evaluation and treatment of symptoms.  Follow-up with your primary care provider for your other medical issues, concerns and or health care needs.   Total Discharge Time:  Greater than 30 minutes.  Signed: [place], [place], FBP-BC 04/15/2015, 10:33 AM  I personally assessed the patient and formulated the plan Geralyn Flash A. Sabra Heck, M.D.

## 2015-04-15 NOTE — Tx Team (Signed)
Interdisciplinary Treatment Plan Update (Adult)  Date:  04/15/2015  Time Reviewed:  9:56 AM   Progress in Treatment: Attending groups: Yes Participating in groups:  Yes. Taking medication as prescribed:  Yes. Tolerating medication:  Yes. Family/Significant othe contact made:  SPE completed with pt's niece.  Patient understands diagnosis:  No. Pt does not understand why police brought him to the hospital. Lack of insight at this time.  Discussing patient identified problems/goals with staff:  Yes. Medical problems stabilized or resolved:  Yes. Denies suicidal/homicidal ideation: Yes. Issues/concerns per patient self-inventory:  Other:  Discharge Plan or Barriers: Pt reports that he lives alone and follows up at [facility] for medication management.   Reason for Continuation of Hospitalization: none  Comments:  Keith Murray is an 55 y.o. male was brought to [facility] by the police after they noticed him acting peculiar. Pt originally reported that he would not answer any of the writer's questions but did open up after a few minutes. Pt reports that he was working and the police just picked him up for no reason. Pt reports A/V hallucinations when he does not take his medication that he gets from [facility]. Pt reports being compliant with his medications and lives alone. Pt could not say if he had an ACTT team or not. Pt denied SI, HI, any current substance use, depression, and anxiety. Pt was pleasant for most of the assessment and reported that he was fine because he could smile.  Diagnosis: F25.1 Schizoaffective disorder  Estimated length of stay:  D/c today   Additional Comments:  Patient and CSW reviewed pt's identified goals and treatment plan. Patient verbalized understanding and agreed to treatment plan. CSW reviewed [facility] "Discharge Process and Patient Involvement" Form. Pt verbalized understanding of information provided and signed form.    Review of initial/current patient goals per problem  list:  1. Goal(s): Patient will participate in aftercare plan  Met:Yes  Target date: at discharge  As evidenced by: Patient will participate within aftercare plan AEB aftercare provider and housing plan at discharge being identified.  1/17: Pt plans to return home and follow up at [facility]. CSW assessing. Pt unsure if he has an ACT team.   1/18: Pt plans to return home and has appt scheduled at [facility] for med management. He declined all other referrals.   2. Goal (s): Patient will exhibit decreased depressive symptoms and suicidal ideations.  Met: Yes   Target date: at discharge  As evidenced by: Patient will utilize self rating of depression at 3 or below and demonstrate decreased signs of depression or be deemed stable for discharge by MD.  1/17: Pt rates depression as low and denies SI/HI/AVH. Pt covers face with covers; continues to demonstrate bizarre behavior.   4. Goal(s): Patient will demonstrate decreased signs of withdrawal due to substance abuse  Met:Yes  Target date:at discharge   As evidenced by: Patient will produce a CIWA/COWS score of 0, have stable vitals signs, and no symptoms of withdrawal.  1/17: Pt denies substance abuse. UDS positive for cocaine, benzos, and THC. COWs of 4 as of yesterday.   1/18: Pt denies withdrawal symptoms and CIWA score of 0/stable vitals  Attendees: Patient:   04/15/2015 9:56 AM   Family:   04/15/2015 9:56 AM   Physician:  Dr. Carlton Adam, MD 04/15/2015 9:56 AM   Nursing:   Ashok Pall RN  04/15/2015 9:56 AM   Clinical Social Worker: Maxie Better, LCSW 04/15/2015 9:56 AM   Clinical Social Worker: Erasmo Downer  Drinkard Latanya PresserPeri Maris LCSWA 04/15/2015 9:56 AM   Other:  Gerline Legacy Nurse Case Manager 04/15/2015 9:56 AM   Other: Agustina Caroli NP  04/15/2015 9:56 AM   Other:   04/15/2015 9:56 AM   Other:  04/15/2015 9:56 AM   Other:  04/15/2015 9:56 AM   Other:  04/15/2015 9:56 AM    04/15/2015 9:56 AM    04/15/2015 9:56 AM     04/15/2015 9:56 AM    04/15/2015 9:56 AM    Scribe for Treatment Team:   Maxie Better, LCSW 04/15/2015 9:56 AM

## 2015-04-15 NOTE — Progress Notes (Signed)
Pt reports he has had a good day.  He denies SI/HI/AVH.  He denies withdrawal symptoms at this time.  He reports that he is discharging tomorrow and that he will follow up at the [facility].  He says that he goes to [facility] for med management.  Pt voices no needs or concerns at this time.  He reports he has been going to groups and feels they are helpful.  Pt is pleasant and cooperative with staff and peers.  Support and encouragement offered.  Safety maintained with q15 minute checks.

## 2015-04-15 NOTE — Progress Notes (Signed)
Recreation Therapy Notes  Date: 01.18.2017 Time: 9:30am Location: [address]   Group Topic: Stress Management  Goal Area(s) Addresses:  Patient will actively participate in stress management techniques presented during session.   Behavioral Response: Did not attend.    [name] Nekesha Font, LRT/CTRS        Icyss Skog L 04/15/2015 11:49 AM

## 2015-04-15 NOTE — Progress Notes (Signed)
Patient ID: Keith Murray, male   DOB: 1960-05-02, 55 y.o.   MRN: [id number]  Pt. Denies SI/HI and A/V hallucinations. Belongings returned to patient at time of discharge. Patient denies any pain or discomfort. Discharge instructions and medications were reviewed with patient and his niece with patient's permission. Patient and his niece verbalized understanding of both medications and discharge instructions. Q15 minute safety checks maintained until discharge. No distress upon discharge.

## 2015-07-27 ENCOUNTER — Emergency Department (HOSPITAL_COMMUNITY): Payer: Medicare Other

## 2015-07-27 ENCOUNTER — Encounter (HOSPITAL_COMMUNITY): Payer: Self-pay | Admitting: Emergency Medicine

## 2015-07-27 ENCOUNTER — Emergency Department (HOSPITAL_COMMUNITY)
Admission: EM | Admit: 2015-07-27 | Discharge: 2015-07-27 | Discharge status: Against Medical Advice | Payer: Medicare Other | Attending: Emergency Medicine | Admitting: Emergency Medicine

## 2015-07-27 DIAGNOSIS — F17200 Nicotine dependence, unspecified, uncomplicated: Secondary | ICD-10-CM | POA: Insufficient documentation

## 2015-07-27 DIAGNOSIS — F259 Schizoaffective disorder, unspecified: Secondary | ICD-10-CM | POA: Insufficient documentation

## 2015-07-27 DIAGNOSIS — R071 Chest pain on breathing: Secondary | ICD-10-CM | POA: Diagnosis not present

## 2015-07-27 DIAGNOSIS — R918 Other nonspecific abnormal finding of lung field: Secondary | ICD-10-CM | POA: Diagnosis not present

## 2015-07-27 DIAGNOSIS — F29 Unspecified psychosis not due to a substance or known physiological condition: Secondary | ICD-10-CM | POA: Insufficient documentation

## 2015-07-27 DIAGNOSIS — Z79899 Other long term (current) drug therapy: Secondary | ICD-10-CM | POA: Diagnosis not present

## 2015-07-27 DIAGNOSIS — R0602 Shortness of breath: Secondary | ICD-10-CM | POA: Diagnosis not present

## 2015-07-27 DIAGNOSIS — F419 Anxiety disorder, unspecified: Secondary | ICD-10-CM | POA: Diagnosis not present

## 2015-07-27 DIAGNOSIS — R079 Chest pain, unspecified: Secondary | ICD-10-CM | POA: Diagnosis not present

## 2015-07-27 NOTE — ED Notes (Signed)
Pt speaking with PA.  Continues to refuse lab draw.

## 2015-07-27 NOTE — ED Notes (Signed)
Brought in from home by EMS.  Originally c/o anxiety then told providers that he was out of behavioral health meds.  Decided he was no longer anxious but he needed to come in for right sided chest pain that started 2 weeks ago.  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  Quiet during triage.  Very difficult to get him to answer any questions.  When asked to remove shirt for EKG threw hat in the floor and had to be coaxed to allow care.

## 2015-07-27 NOTE — ED Provider Notes (Signed)
CSN: [id number]     Arrival date & time 07/27/15  0614 History   First MD Initiated Contact with Patient 07/27/15 [phone]     Chief Complaint  Patient presents with  . Chest Pain  . Anxiety     (Consider location/radiation/quality/duration/timing/severity/associated sxs/prior Treatment) HPI Comments: Patient with a history of Polysubstance Abuse, Schizoaffective Disorder, and Anxiety presents today via EMS with a chief complaint of chest pain.  However, upon my evaluation patient denies chest pain and states that he never had chest pain. According to EMS he was initially complaining of anxiety and being out of his behavioral health meds.  He then later stated that he has been having chest pain for the past week after lifting bricks at work.   During my evaluation, he reports that someone called EMS on him for an unknown reason.  He denies any symptoms at this time and states that he would like to be discharged home.  He denies SOB, cough, fever, chills, LE pain, or any other symptoms at this time.  He reports that he is angry.  He denies any SI or HI.  He denies any recent alcohol or recreational drug use.    Patient is a 55 y.o. male presenting with chest pain and anxiety. The history is provided by the patient.  Chest Pain Associated symptoms: anxiety   Anxiety Associated symptoms include chest pain.    Past Medical History  Diagnosis Date  . Anxiety   . Schizoaffective disorder ([place])   . Polysubstance abuse     cocaine, etoh, marijuana  . Psychosis    Past Surgical History  Procedure Laterality Date  . Hernia repair     No family history on file. Social History  Substance Use Topics  . Smoking status: Current Every Day Smoker -- 0.50 packs/day for 4 years  . Smokeless tobacco: None  . Alcohol Use: Yes     Comment: reports not drinking any longer    Review of Systems  Cardiovascular: Positive for chest pain.      Allergies  Review of patient's allergies indicates no  known allergies.  Home Medications   Prior to Admission medications   Medication Sig Start Date End Date Taking? Authorizing Provider  benztropine (COGENTIN) 0.5 MG tablet Take 1 tablet (0.5 mg total) by mouth 2 (two) times daily. For prevention of drug induced tremors 04/15/15   Encarnacion Slates, NP  FLUoxetine (PROZAC) 20 MG capsule Take 1 capsule (20 mg total) by mouth daily. For depression 04/15/15   Encarnacion Slates, NP  haloperidol (HALDOL) 5 MG tablet Take 1 tablet (5 mg total) by mouth 2 (two) times daily. For mood control 04/15/15   Encarnacion Slates, NP  hydrOXYzine (ATARAX/VISTARIL) 25 MG tablet Take 1 tablet (25 mg) every 6 hours as needed: Foe anxiety/sleep 04/15/15   Encarnacion Slates, NP  lisinopril (PRINIVIL,ZESTRIL) 10 MG tablet Take 1 tablet (10 mg total) by mouth daily. For high blood pressure 04/15/15   Encarnacion Slates, NP  Multiple Vitamin (MULTIVITAMIN WITH MINERALS) TABS tablet Take 1 tablet by mouth daily. May purchase over the counter: As a Vitamin supplement 04/15/15   Encarnacion Slates, NP  nicotine (NICODERM CQ - DOSED IN MG/24 HOURS) 21 mg/24hr patch Place 1 patch (21 mg total) onto the skin daily. For smoking cessation 04/15/15   Encarnacion Slates, NP  traZODone (DESYREL) 100 MG tablet Take 1 tablet (100 mg total) by mouth at bedtime. For insomnia 04/15/15  Encarnacion Slates, NP   BP 122/96 mmHg  Pulse 91  Temp(Src) 98.8 F (37.1 C) (Oral)  Resp 35  Ht '6\' 1"'$  (1.854 m)  Wt 69.854 kg  BMI 20.32 kg/m2  SpO2 96% Physical Exam  Constitutional: He appears well-developed and well-nourished.  HENT:  Head: Normocephalic and atraumatic.  Mouth/Throat: Oropharynx is clear and moist.  Neck: Normal range of motion. Neck supple.  Cardiovascular: Normal rate, regular rhythm and normal heart sounds.   Pulmonary/Chest: Effort normal and breath sounds normal. No respiratory distress. He has no wheezes. He has no rales.  Abdominal: Soft. There is no tenderness.  Musculoskeletal:  No LE edema or erythema   Neurological: He is alert.  Skin: Skin is warm and dry.  Psychiatric: His affect is angry and labile.  Nursing note and vitals reviewed.   ED Course  Procedures (including critical care time) Labs Review Labs Reviewed  BASIC METABOLIC PANEL  CBC  I-STAT [place], ED    Imaging Review Dg Chest 2 View  07/27/2015  CLINICAL DATA:  Shortness of breath, chest pain today EXAM: CHEST  2 VIEW COMPARISON:  None. FINDINGS: There is a large mass projecting in the right upper chest measuring 10.7 cm. This appears to be pleural based, with adjacent pleural thickening. Left lung is clear. No effusions. Heart is normal size. No acute bony abnormality. Degenerative changes in the [facility] joints bilaterally. IMPRESSION: Large masslike opacity in the right upper lobe which appears to be pleural based. Findings concerning for neoplasm. Recommend further evaluation with chest CT with IV contrast. Electronically Signed   By: Rolm Baptise M.D.   On: 07/27/2015 07:14   I have personally reviewed and evaluated these images and lab results as part of my medical decision-making.   EKG Interpretation   Date/Time:  Monday Jul 27 2015 06:21:07 EDT Ventricular Rate:  95 PR Interval:  146 QRS Duration: 83 QT Interval:  322 QTC Calculation: 405 R Axis:   43 Text Interpretation:  Sinus rhythm ST elev, probable normal early repol  pattern Confirmed by Christy Gentles  MD, DONALD ([id number]) on 07/27/2015 6:31:41 AM      MDM   Final diagnoses:  None   Patient brought in today by EMS for chest pain.  Apparently at one point he told EMS that he needed to be seen for anxiety.  Then he stated that he was having chest pain.  Upon arrival in the ED, he denies chest pain or any other symptoms.  No ischemic changes on EKG.  CXR showing a large masslike opacity in the RUL.  Patient informed of the results of the CXR and the need for follow up with a CT.  He was informed that the appearance of the mass is suspicious for Cancer. He  verbalizes understanding, but denies any further work up in the ED.  He refused all blood work.  He is not hypoxic.  No signs of distress.  Patient appears stable at time of discharge.  Return precautions given to the patient.      Hyman Bible, PA-C 07/27/15 2137  Hyman Bible, PA-C 07/27/15 2139  Ripley Fraise, MD 07/28/15 [phone]

## 2015-07-27 NOTE — ED Notes (Signed)
Pt refuses to allow RN to draw blood, will not talk much, nods head in response to questions.

## 2015-07-27 NOTE — Discharge Instructions (Signed)
It is very important for you to follow up with a Primary Care Physician regarding this mass on your lung.  This could be Cancer.   Return to the Emergency Department if you develop chest pain or shortness of breath.  Also return to the Emergency Department if you develop thoughts of hurting self or hurting others.

## 2015-08-07 ENCOUNTER — Emergency Department (HOSPITAL_COMMUNITY)
Admission: EM | Admit: 2015-08-07 | Discharge: 2015-08-10 | Disposition: A | Discharge status: Home / Self Care | Payer: Medicare Other | Attending: Emergency Medicine | Admitting: Emergency Medicine

## 2015-08-07 DIAGNOSIS — Z79899 Other long term (current) drug therapy: Secondary | ICD-10-CM | POA: Insufficient documentation

## 2015-08-07 DIAGNOSIS — F1410 Cocaine abuse, uncomplicated: Secondary | ICD-10-CM

## 2015-08-07 DIAGNOSIS — F1494 Cocaine use, unspecified with cocaine-induced mood disorder: Secondary | ICD-10-CM

## 2015-08-07 DIAGNOSIS — R000 Tachycardia, unspecified: Secondary | ICD-10-CM | POA: Insufficient documentation

## 2015-08-07 DIAGNOSIS — F17200 Nicotine dependence, unspecified, uncomplicated: Secondary | ICD-10-CM | POA: Insufficient documentation

## 2015-08-07 DIAGNOSIS — F250 Schizoaffective disorder, bipolar type: Secondary | ICD-10-CM | POA: Diagnosis not present

## 2015-08-07 DIAGNOSIS — R451 Restlessness and agitation: Secondary | ICD-10-CM | POA: Diagnosis present

## 2015-08-07 DIAGNOSIS — F1414 Cocaine abuse with cocaine-induced mood disorder: Secondary | ICD-10-CM | POA: Diagnosis not present

## 2015-08-07 DIAGNOSIS — R45850 Homicidal ideations: Secondary | ICD-10-CM | POA: Diagnosis not present

## 2015-08-07 DIAGNOSIS — F1490 Cocaine use, unspecified, uncomplicated: Secondary | ICD-10-CM | POA: Diagnosis present

## 2015-08-07 LAB — COMPREHENSIVE METABOLIC PANEL
ALT: 30 U/L (ref 17–63)
AST: 62 U/L — ABNORMAL HIGH (ref 15–41)
Albumin: 3.2 g/dL — ABNORMAL LOW (ref 3.5–5.0)
Alkaline Phosphatase: 153 U/L — ABNORMAL HIGH (ref 38–126)
Anion gap: 7 (ref 5–15)
BUN: 19 mg/dL (ref 6–20)
CO2: 27 mmol/L (ref 22–32)
Calcium: 11.2 mg/dL — ABNORMAL HIGH (ref 8.9–10.3)
Chloride: 105 mmol/L (ref 101–111)
Creatinine, Ser: 1.44 mg/dL — ABNORMAL HIGH (ref 0.61–1.24)
GFR calc Af Amer: >60 mL/min (ref >60)
GFR calc non Af Amer: 54 mL/min — ABNORMAL LOW (ref >60)
Glucose, Bld: 120 mg/dL — ABNORMAL HIGH (ref 65–99)
Potassium: 3.7 mmol/L (ref 3.5–5.1)
Sodium: 139 mmol/L (ref 135–145)
Total Bilirubin: 0.8 mg/dL (ref 0.3–1.2)
Total Protein: 7.3 g/dL (ref 6.5–8.1)

## 2015-08-07 LAB — CBC WITH DIFFERENTIAL/PLATELET
Basophils Absolute: 0 10*3/uL (ref 0.0–0.1)
Basophils Relative: 0 %
Eosinophils Absolute: 0 10*3/uL (ref 0.0–0.7)
Eosinophils Relative: 0 %
HCT: 32.3 % — ABNORMAL LOW (ref 39.0–52.0)
Hemoglobin: 11.2 g/dL — ABNORMAL LOW (ref 13.0–17.0)
Lymphocytes Relative: 10 %
Lymphs Abs: 2.2 10*3/uL (ref 0.7–4.0)
MCH: 29.2 pg (ref 26.0–34.0)
MCHC: 34.7 g/dL (ref 30.0–36.0)
MCV: 84.3 fL (ref 78.0–100.0)
Monocytes Absolute: 1.1 10*3/uL — ABNORMAL HIGH (ref 0.1–1.0)
Monocytes Relative: 5 %
Neutro Abs: 19.1 10*3/uL — ABNORMAL HIGH (ref 1.7–7.7)
Neutrophils Relative %: 85 %
Platelets: 404 10*3/uL — ABNORMAL HIGH (ref 150–400)
RBC: 3.83 MIL/uL — ABNORMAL LOW (ref 4.22–5.81)
RDW: 13.5 % (ref 11.5–15.5)
WBC: 22.5 10*3/uL — ABNORMAL HIGH (ref 4.0–10.5)

## 2015-08-07 LAB — ETHANOL: Alcohol, Ethyl (B): <5 mg/dL (ref <5)

## 2015-08-07 MED ORDER — SODIUM CHLORIDE 0.9 % IV BOLUS (SEPSIS)
2000.0000 mL | Freq: Once | INTRAVENOUS | Status: AC
  Administered 2015-08-07: 2000 mL via INTRAVENOUS

## 2015-08-07 MED ORDER — NYSTATIN 100000 UNIT/GM EX POWD: Freq: Two times a day (BID) | CUTANEOUS | Status: DC

## 2015-08-07 MED ORDER — LORAZEPAM 2 MG/ML IJ SOLN: 2.0000 mg | Freq: Once | INTRAMUSCULAR | Status: DC

## 2015-08-07 MED ORDER — ZOLPIDEM TARTRATE 5 MG PO TABS: 5.0000 mg | ORAL_TABLET | Freq: Every evening | ORAL | Status: DC | PRN

## 2015-08-07 MED ORDER — ZIPRASIDONE MESYLATE 20 MG IM SOLR
20.0000 mg | Freq: Once | INTRAMUSCULAR | Status: AC
  Administered 2015-08-07: 20 mg via INTRAMUSCULAR
  Filled 2015-08-07: qty 20

## 2015-08-07 MED ORDER — LORAZEPAM 1 MG PO TABS
0.0000 mg | ORAL_TABLET | Freq: Four times a day (QID) | ORAL | Status: AC
  Administered 2015-08-08: 1 mg via ORAL
  Filled 2015-08-07 (×3): qty 1

## 2015-08-07 MED ORDER — ONDANSETRON HCL 4 MG PO TABS: 4.0000 mg | ORAL_TABLET | Freq: Three times a day (TID) | ORAL | Status: DC | PRN

## 2015-08-07 MED ORDER — IBUPROFEN 200 MG PO TABS: 600.0000 mg | ORAL_TABLET | Freq: Three times a day (TID) | ORAL | Status: DC | PRN

## 2015-08-07 MED ORDER — NYSTATIN 100000 UNIT/GM EX POWD
Freq: Two times a day (BID) | CUTANEOUS | Status: DC
  Administered 2015-08-08 – 2015-08-10 (×6): via TOPICAL
  Filled 2015-08-07 (×2): qty 15

## 2015-08-07 MED ORDER — NICOTINE 21 MG/24HR TD PT24
21.0000 mg | MEDICATED_PATCH | Freq: Every day | TRANSDERMAL | Status: DC
  Administered 2015-08-08 – 2015-08-10 (×3): 21 mg via TRANSDERMAL
  Filled 2015-08-07 (×3): qty 1

## 2015-08-07 MED ORDER — LORAZEPAM 1 MG PO TABS
0.0000 mg | ORAL_TABLET | Freq: Two times a day (BID) | ORAL | Status: DC
  Administered 2015-08-09: 1 mg via ORAL

## 2015-08-07 MED ORDER — ACETAMINOPHEN 325 MG PO TABS: 650.0000 mg | ORAL_TABLET | ORAL | Status: DC | PRN

## 2015-08-07 MED ORDER — ALUM & MAG HYDROXIDE-SIMETH 200-200-20 MG/5ML PO SUSP: 30.0000 mL | ORAL | Status: DC | PRN

## 2015-08-07 NOTE — ED Notes (Signed)
Bed: AJ51 Expected date:  Expected time:  Means of arrival:  Comments: Hold for triage 3

## 2015-08-07 NOTE — ED Notes (Addendum)
Pt arrived into department screaming loudly and verbally aggressive. Unable to obtain hx or reason for being here. Security called and enroute to bedside.  Security from [facility] called and informed security here the pt came from [facility] to [place].

## 2015-08-07 NOTE — ED Provider Notes (Signed)
CSN: [id number]     Arrival date & time 08/07/15  1608 History   First MD Initiated Contact with Patient 08/07/15 1642     No chief complaint on file.    (Consider location/radiation/quality/duration/timing/severity/associated sxs/prior Treatment) HPI Comments: Pt here with agitation and aggressive behavior Walked over from [facility] just pta Has h/o schizoaffective d/o Pt non-cooperative w/ exam--all information is from the old chart  The history is provided by the patient. The history is limited by the condition of the patient.    Past Medical History  Diagnosis Date  . Anxiety   . Schizoaffective disorder ([place])   . Polysubstance abuse     cocaine, etoh, marijuana  . Psychosis    Past Surgical History  Procedure Laterality Date  . Hernia repair     No family history on file. Social History  Substance Use Topics  . Smoking status: Current Every Day Smoker -- 0.50 packs/day for 4 years  . Smokeless tobacco: Not on file  . Alcohol Use: Yes     Comment: reports not drinking any longer    Review of Systems  Unable to perform ROS: Psychiatric disorder      Allergies  Review of patient's allergies indicates no known allergies.  Home Medications   Prior to Admission medications   Medication Sig Start Date End Date Taking? Authorizing Provider  benztropine (COGENTIN) 0.5 MG tablet Take 1 tablet (0.5 mg total) by mouth 2 (two) times daily. For prevention of drug induced tremors 04/15/15   Encarnacion Slates, NP  FLUoxetine (PROZAC) 20 MG capsule Take 1 capsule (20 mg total) by mouth daily. For depression 04/15/15   Encarnacion Slates, NP  haloperidol (HALDOL) 5 MG tablet Take 1 tablet (5 mg total) by mouth 2 (two) times daily. For mood control 04/15/15   Encarnacion Slates, NP  hydrOXYzine (ATARAX/VISTARIL) 25 MG tablet Take 1 tablet (25 mg) every 6 hours as needed: Foe anxiety/sleep 04/15/15   Encarnacion Slates, NP  lisinopril (PRINIVIL,ZESTRIL) 10 MG tablet Take 1 tablet (10 mg total) by mouth  daily. For high blood pressure 04/15/15   Encarnacion Slates, NP  Multiple Vitamin (MULTIVITAMIN WITH MINERALS) TABS tablet Take 1 tablet by mouth daily. May purchase over the counter: As a Vitamin supplement 04/15/15   Encarnacion Slates, NP  nicotine (NICODERM CQ - DOSED IN MG/24 HOURS) 21 mg/24hr patch Place 1 patch (21 mg total) onto the skin daily. For smoking cessation 04/15/15   Encarnacion Slates, NP  traZODone (DESYREL) 100 MG tablet Take 1 tablet (100 mg total) by mouth at bedtime. For insomnia 04/15/15   Encarnacion Slates, NP   BP 160/114 mmHg  Pulse 116  Resp 19  Ht '6\' 2"'$  (1.88 m)  Wt 88.451 kg  BMI 25.03 kg/m2  SpO2 99% Physical Exam  Constitutional: He appears well-developed and well-nourished.  Non-toxic appearance. No distress.  HENT:  Head: Normocephalic and atraumatic.  Eyes: Conjunctivae, EOM and lids are normal. Pupils are equal, round, and reactive to light.  Neck: Normal range of motion. Neck supple. No tracheal deviation present. No thyroid mass present.  Cardiovascular: Regular rhythm and normal heart sounds.  Tachycardia present.  Exam reveals no gallop.   No murmur Stoiber. Pulmonary/Chest: Effort normal and breath sounds normal. No stridor. No respiratory distress. He has no decreased breath sounds. He has no wheezes. He has no rhonchi. He has no rales.  Abdominal: Soft. Normal appearance and bowel sounds are normal. He exhibits no  distension. There is no tenderness. There is no rebound and no CVA tenderness.  Musculoskeletal: Normal range of motion. He exhibits no edema or tenderness.  biLateral feet erythema consistent with tinea  Neurological: He is alert. No cranial nerve deficit or sensory deficit. GCS eye subscore is 4. GCS verbal subscore is 5. GCS motor subscore is 6.  Moves all 4 extremities  Skin: Skin is warm and dry. No abrasion and no rash noted.  Psychiatric: His mood appears anxious. His speech is rapid and/or pressured. He is agitated and aggressive.  Nursing note and  vitals reviewed.   ED Course  Procedures (including critical care time) Labs Review Labs Reviewed  CBC WITH DIFFERENTIAL/PLATELET  COMPREHENSIVE METABOLIC PANEL  ETHANOL  URINE RAPID DRUG SCREEN, HOSP PERFORMED    Imaging Review No results found. I have personally reviewed and evaluated these images and lab results as part of my medical decision-making.   EKG Interpretation None      MDM   Final diagnoses:  None    Patient given Geodon for severe agitation. Patient placed under involuntary commitment. Patient rechecked multiple times and is more cooperative at this time. Bilateral feet have findings consistent with tinea. Will place on foot powder for this. Has been seen by psychiatry and they're working on placement   Lacretia Leigh, MD 08/07/15 [phone]

## 2015-08-07 NOTE — ED Notes (Signed)
Patient is aware of the need for urine.

## 2015-08-07 NOTE — BH Assessment (Addendum)
Tele Assessment Note   Keith Murray is an 55 y.o. male presenting to [facility] unaccompanied. Pt stated "my niece called the cops on me and I am going to make sure they go to jail". "I just got out of the hospital". Pt denies SI and AVH at this time. Pt is reporting homicidal ideations with a plan to kill his niece with his fist. Pt stated "I am going to make sure they die". Pt did not actively participate in this assessment and stated to this writer "f a n*gg**, f a b**ch, I  am done talking".  It is unclear if pt is receiving any mental health treatment at this time; however he has had multiple psychiatric admissions.  Inpatient treatment is recommended for safety and stabilization.   Diagnosis: Schizoaffective disorder   Past Medical History:  Past Medical History  Diagnosis Date  . Anxiety   . Schizoaffective disorder ([place])   . Polysubstance abuse     cocaine, etoh, marijuana  . Psychosis     Past Surgical History  Procedure Laterality Date  . Hernia repair      Family History: No family history on file.  Social History:  reports that he has been smoking.  He does not have any smokeless tobacco history on file. He reports that he drinks alcohol. He reports that he uses illicit drugs (Cocaine and Marijuana).  Additional Social History:  Alcohol / Drug Use History of alcohol / drug use?: No history of alcohol / drug abuse  CIWA: CIWA-Ar BP: 115/85 mmHg Pulse Rate: 101 COWS:    PATIENT STRENGTHS: (choose at least two) Average or above average intelligence  Allergies: No Known Allergies  Home Medications:  (Not in a hospital admission)  OB/GYN Status:  No LMP for male patient.  General Assessment Data Location of Assessment: [facility] ED TTS Assessment: In system Is this a Tele or Face-to-Face Assessment?: Face-to-Face Is this an Initial Assessment or a Re-assessment for this encounter?: Initial Assessment Living Arrangements: Alone Can pt return to current living arrangement?:  Yes Admission Status: Involuntary Is patient capable of signing voluntary admission?: Yes Referral Source: Self/Family/Friend Insurance type: [facility]      Crisis Care Plan Living Arrangements: Alone Legal Guardian:  ([place] ) Name of Psychiatrist: No provider reported  Name of Therapist: No provider reported   Education Status Is patient currently in school?: No  Risk to self with the past 6 months Suicidal Ideation: No Has patient been a risk to self within the past 6 months prior to admission? : No Suicidal Intent: No Has patient had any suicidal intent within the past 6 months prior to admission? : No Is patient at risk for suicide?: No Suicidal Plan?: No Has patient had any suicidal plan within the past 6 months prior to admission? : No Access to Means: No What has been your use of drugs/alcohol within the last 12 months?: Pt denies  Previous Attempts/Gestures:  (UTA) How many times?:  (UTA) Other Self Harm Risks:  (UTA) Intentional Self Injurious Behavior:  (UTA) Family Suicide History: Unable to assess Recent stressful life event(s):  (UTA') Persecutory voices/beliefs?:  [name]) Depression:  (UTA) Depression Symptoms:  (UTA) Substance abuse history and/or treatment for substance abuse?: No Suicide prevention information given to non-admitted patients: Not applicable  Risk to Others within the past 6 months Homicidal Ideation: Yes-Currently Present Does patient have any lifetime risk of violence toward others beyond the six months prior to admission? : Unknown Thoughts of Harm to Others: Yes-Currently  Present Comment - Thoughts of Harm to Others: "I am going to make sure they die"  Current Homicidal Intent: Yes-Currently Present Current Homicidal Plan: Yes-Currently Present Describe Current Homicidal Plan: "I am going to make sure they die with my fist Access to Homicidal Means: Yes Describe Access to Homicidal Means: physical violence  Identified Victim: Niece  (Pt did  not provide a specific name. Only stated "my niece". ) History of harm to others?:  (unknown ) Assessment of Violence: None Noted Violent Behavior Description: No violent behaviors observed.  Does patient have access to weapons?:  ([name]) Criminal Charges Pending?: No Does patient have a court date: No Is patient on probation?: No  Psychosis Hallucinations:  (UTA) Delusions:  (UTA)  Mental Status Report Appearance/Hygiene: Unable to Assess (Pt has blanket over his head. ) Eye Contact: Poor Motor Activity: Unable to assess Speech: Aggressive Level of Consciousness: Irritable, Quiet/awake Mood: Irritable Affect: Irritable Anxiety Level: None Thought Processes: Circumstantial Judgement: Impaired Orientation: Unable to assess Obsessive Compulsive Thoughts/Behaviors: Unable to Assess  Cognitive Functioning Concentration: Unable to Assess Memory: Unable to Assess IQ: Average Insight: Unable to Assess Impulse Control: Unable to Assess Appetite:  (UTA) Weight Loss:  (UTA) Weight Gain:  (UTA) Sleep: Unable to Assess Total Hours of Sleep:  (UTA) Vegetative Symptoms: Unable to Assess  ADLScreening [facility]) Patient's cognitive ability adequate to safely complete daily activities?: Yes Patient able to express need for assistance with ADLs?: Yes Independently performs ADLs?: Yes (appropriate for developmental age)  Prior Inpatient Therapy Prior Inpatient Therapy: Yes Prior Therapy Dates: 2012, 2014, 2015, 2017 Prior Therapy Facilty/Provider(s): [facility]  Reason for Treatment: Schizoaffective disorder   Prior Outpatient Therapy Prior Outpatient Therapy:  (UTA ) Does patient have an ACCT team?: Unknown Does patient have Intensive In-House Services?  : No Does patient have [facility] services? : Unknown Does patient have P4CC services?: Unknown  ADL Screening (condition at time of admission) Patient's cognitive ability adequate to safely complete daily  activities?: Yes Patient able to express need for assistance with ADLs?: Yes Independently performs ADLs?: Yes (appropriate for developmental age)       Abuse/Neglect Assessment (Assessment to be complete while patient is alone) Physical Abuse:  (UTA) Verbal Abuse:  (UTA) Sexual Abuse:  (UTA) Exploitation of patient/patient's resources:  (UTA) Self-Neglect:  (UTA)          Additional Information 1:1 In Past 12 Months?: No CIRT Risk: No Elopement Risk: No Does patient have medical clearance?: Yes     Disposition:  Disposition Initial Assessment Completed for this Encounter: Yes Disposition of Patient: Inpatient treatment program Type of inpatient treatment program: Adult  Keith Murray S 08/07/2015 9:27 PM

## 2015-08-07 NOTE — ED Notes (Signed)
Patient stated that he was in no pain but, "My feet are itching"

## 2015-08-07 NOTE — ED Notes (Signed)
Patient is currently sleeping.  Breathing even and unlabored.  NAD at this time.

## 2015-08-07 NOTE — ED Notes (Signed)
Attempted blood draw with no success. Patient would no stay still long enough to have blood drawn.

## 2015-08-07 NOTE — ED Notes (Signed)
Pt is uncooperative at this time. Unable to obtain labs

## 2015-08-07 NOTE — BH Assessment (Signed)
Assessment completed. Consulted Darlyne Russian, PA-C who recommended inpatient treatment. TTS to seek placement.

## 2015-08-07 NOTE — ED Notes (Signed)
Patient unable to provide urine sample at this time

## 2015-08-07 NOTE — ED Notes (Signed)
Pt has been verbally aggressive since arriving to ED.  Security, EDP were called.  Pt given geodon per order.

## 2015-08-07 NOTE — ED Notes (Signed)
Patient initially cooperative.  Patient advised that he did not have any allergies to anything that he was aware of.  This RN asked if blood draw could be performed and patient became loud and began cursing and hid himself under the blanket.  Once patient was calm again this RN asked if blood could be drawn and he sat up in the bed and outstretched his arm allowing for blood draw. Blood was obtained and sent to lab.  Patient is currently sitting up in bed eating a meal.  Will continue to attempt to obtain medical history from patient.

## 2015-08-07 NOTE — ED Notes (Signed)
Verbal order for restraints given by Dr. Zenia Resides.  Pt placed in restraints onto an ED stretcher and taken to room 18.

## 2015-08-07 NOTE — [facility] Counselor (Signed)
This Probation officer faxed out supporting documentation for patient placement to the following psychiatric facilities: [place], [place] Counselor

## 2015-08-08 DIAGNOSIS — R45850 Homicidal ideations: Secondary | ICD-10-CM

## 2015-08-08 DIAGNOSIS — F250 Schizoaffective disorder, bipolar type: Secondary | ICD-10-CM

## 2015-08-08 LAB — RAPID URINE DRUG SCREEN, HOSP PERFORMED
Amphetamines: NOT DETECTED
Barbiturates: NOT DETECTED
Benzodiazepines: NOT DETECTED
Cocaine: POSITIVE — AB
Opiates: NOT DETECTED
Tetrahydrocannabinol: NOT DETECTED

## 2015-08-08 MED ORDER — ZIPRASIDONE MESYLATE 20 MG IM SOLR
10.0000 mg | Freq: Once | INTRAMUSCULAR | Status: AC
  Administered 2015-08-08: 10 mg via INTRAMUSCULAR
  Filled 2015-08-08: qty 20

## 2015-08-08 MED ORDER — HALOPERIDOL 5 MG PO TABS
5.0000 mg | ORAL_TABLET | Freq: Two times a day (BID) | ORAL | Status: DC
  Administered 2015-08-08 – 2015-08-10 (×5): 5 mg via ORAL
  Filled 2015-08-08 (×5): qty 1

## 2015-08-08 MED ORDER — LISINOPRIL 10 MG PO TABS
10.0000 mg | ORAL_TABLET | Freq: Every day | ORAL | Status: DC
  Administered 2015-08-08 – 2015-08-10 (×3): 10 mg via ORAL
  Filled 2015-08-08 (×3): qty 1

## 2015-08-08 MED ORDER — BENZTROPINE MESYLATE 1 MG PO TABS
0.5000 mg | ORAL_TABLET | Freq: Two times a day (BID) | ORAL | Status: DC
  Administered 2015-08-08: 1 mg via ORAL
  Administered 2015-08-08 – 2015-08-10 (×4): 0.5 mg via ORAL
  Filled 2015-08-08 (×5): qty 1

## 2015-08-08 MED ORDER — STERILE WATER FOR INJECTION IJ SOLN
INTRAMUSCULAR | Status: AC
  Administered 2015-08-08: 09:00:00
  Filled 2015-08-08: qty 10

## 2015-08-08 MED ORDER — TRAZODONE HCL 50 MG PO TABS
50.0000 mg | ORAL_TABLET | Freq: Every day | ORAL | Status: DC
  Administered 2015-08-08 – 2015-08-09 (×2): 50 mg via ORAL
  Filled 2015-08-08 (×2): qty 1

## 2015-08-08 MED ORDER — HALOPERIDOL LACTATE 5 MG/ML IJ SOLN: 5.0000 mg | Freq: Four times a day (QID) | INTRAMUSCULAR | Status: DC | PRN

## 2015-08-08 NOTE — ED Notes (Addendum)
Pt moved to 86 with security and [employer]

## 2015-08-08 NOTE — BH Assessment (Signed)
[facility] Assessment Progress Note  Patient meets inpatient criteria as appropriate bed placement is investigated.

## 2015-08-08 NOTE — ED Notes (Signed)
Report given to [facility]

## 2015-08-08 NOTE — ED Notes (Signed)
On admission to the [facility] pt was irritable, hostile and aggressive.  He yelled at staff, "Get the hell out of my room."  Staff allowed him wide berth in personal space and allowed him to keep his door closed, but he came out into the hallway and took issue with the two patients who were pacing the halls. Geodon 10 mg had been ordered for 8:30 and it was given. Pt allowed the injection without hands on and he very quickly calmed down and became more polite with staff.

## 2015-08-08 NOTE — Consult Note (Signed)
[facility] Face-to-Face Psychiatry Consult   Reason for Consult:  Homicidal threats to his niece, Agitation and substance abuse. Referring Physician:  EDP Patient Identification: Keith Murray MRN:  [id number] Principal Diagnosis: Schizoaffective disorder, bipolar type [facility]) Diagnosis:   Patient Active Problem List   Diagnosis Date Noted  . Alcohol abuse with intoxication ([place]) [F10.129] 04/14/2015  . Cocaine abuse with cocaine-induced mood disorder ([place]) [F14.14] 04/14/2015  . Psychoses [F29]   . Schizoaffective disorder, bipolar type ([place]) [F25.0]   . Alcohol dependence ([place]) [F10.20] 06/08/2013  . Polysubstance (excluding opioids) dependence ([place]) [F19.20] 08/08/2012  . Psychosis [F29] 08/04/2012    Total Time spent with patient: 1 hour  Subjective:   Keith Murray is a 55 y.o. male patient admitted with agitation, aggression, substance abuse and psychosis.  HPI:  Keith Murray is an 55 y.o. male came to the [facility] with increased symptoms of psychosis and aggressive behaviors and required Geodon and Ativan IM injections. Patient has been irritable, angry, threatening to kill his niece for calling police on him when he becomes agitated and aggressive. He is known to the [facility] for similar clinical presentation and required in patient hospitalization for crisis stabilization and safety monitoring. Patient has known for psychosis, paranoid delusions, polysubstance abuse and non compliant with his psychiatric medication treatments. Patient is reporting homicidal ideations with a plan to kill his niece with his fist. Pt stated "I am going to make sure they die". Patient is not psychiatrically stable and recommend in patient psych admission for appropriate treatment and contact his niece regarding homicide threats if he continues to endorse after being sober.   Past Psychiatric History: Has multiple psych admissions at [facility].  Risk to Self: Suicidal Ideation: No Suicidal Intent: No Is patient at risk for  suicide?: No Suicidal Plan?: No Access to Means: No What has been your use of drugs/alcohol within the last 12 months?: Pt denies  How many times?:  (UTA) Other Self Harm Risks:  (UTA) Intentional Self Injurious Behavior:  (UTA) Risk to Others: Homicidal Ideation: Yes-Currently Present Thoughts of Harm to Others: Yes-Currently Present Comment - Thoughts of Harm to Others: "I am going to make sure they die"  Current Homicidal Intent: Yes-Currently Present Current Homicidal Plan: Yes-Currently Present Describe Current Homicidal Plan: "I am going to make sure they die with my fist Access to Homicidal Means: Yes Describe Access to Homicidal Means: physical violence  Identified Victim: Niece  (Pt did not provide a specific name. Only stated "my niece". ) History of harm to others?:  (unknown ) Assessment of Violence: None Noted Violent Behavior Description: No violent behaviors observed.  Does patient have access to weapons?:  (UTA) Criminal Charges Pending?: No Does patient have a court date: No Prior Inpatient Therapy: Prior Inpatient Therapy: Yes Prior Therapy Dates: 2012, 2014, 2015, 2017 Prior Therapy Facilty/Provider(s): [facility]  Reason for Treatment: Schizoaffective disorder  Prior Outpatient Therapy: Prior Outpatient Therapy:  (UTA ) Does patient have an ACCT team?: Unknown Does patient have Intensive In-House Services?  : No Does patient have [facility] services? : Unknown Does patient have P4CC services?: Unknown  Past Medical History:  Past Medical History  Diagnosis Date  . Anxiety   . Schizoaffective disorder ([place])   . Polysubstance abuse     cocaine, etoh, marijuana  . Psychosis     Past Surgical History  Procedure Laterality Date  . Hernia repair     Family History: No family history on file. Family Psychiatric  History: unknown Social History:  History  Alcohol Use  . Yes    Comment: reports not drinking any longer     History  Drug Use  . Yes  .  Special: Cocaine, Marijuana    Comment: THC about once per week and cocaine on occasion.     Social History   Social History  . Marital Status: Single    Spouse Name: N/A  . Number of Children: N/A  . Years of Education: N/A   Social History Main Topics  . Smoking status: Current Every Day Smoker -- 0.50 packs/day for 4 years  . Smokeless tobacco: Not on file  . Alcohol Use: Yes     Comment: reports not drinking any longer  . Drug Use: Yes    Special: Cocaine, Marijuana     Comment: THC about once per week and cocaine on occasion.   . Sexual Activity: Yes    Birth Control/ Protection: Condom   Other Topics Concern  . Not on file   Social History Narrative   Additional Social History:    Allergies:  No Known Allergies  Labs:  Results for orders placed or performed during the hospital encounter of 08/07/15 (from the past 48 hour(s))  CBC with Differential/Platelet     Status: Abnormal   Collection Time: 08/07/15  7:29 PM  Result Value Ref Range   WBC 22.5 (H) 4.0 - 10.5 K/uL   RBC 3.83 (L) 4.22 - 5.81 MIL/uL   Hemoglobin 11.2 (L) 13.0 - 17.0 g/dL   HCT 32.3 (L) 39.0 - 52.0 %   MCV 84.3 78.0 - 100.0 fL   MCH 29.2 26.0 - 34.0 pg   MCHC 34.7 30.0 - 36.0 g/dL   RDW 13.5 11.5 - 15.5 %   Platelets 404 (H) 150 - 400 K/uL   Neutrophils Relative % 85 %   Neutro Abs 19.1 (H) 1.7 - 7.7 K/uL   Lymphocytes Relative 10 %   Lymphs Abs 2.2 0.7 - 4.0 K/uL   Monocytes Relative 5 %   Monocytes Absolute 1.1 (H) 0.1 - 1.0 K/uL   Eosinophils Relative 0 %   Eosinophils Absolute 0.0 0.0 - 0.7 K/uL   Basophils Relative 0 %   Basophils Absolute 0.0 0.0 - 0.1 K/uL  Comprehensive metabolic panel     Status: Abnormal   Collection Time: 08/07/15  7:29 PM  Result Value Ref Range   Sodium 139 135 - 145 mmol/L   Potassium 3.7 3.5 - 5.1 mmol/L   Chloride 105 101 - 111 mmol/L   CO2 27 22 - 32 mmol/L   Glucose, Bld 120 (H) 65 - 99 mg/dL   BUN 19 6 - 20 mg/dL   Creatinine, Ser 1.44 (H) 0.61  - 1.24 mg/dL   Calcium 11.2 (H) 8.9 - 10.3 mg/dL   Total Protein 7.3 6.5 - 8.1 g/dL   Albumin 3.2 (L) 3.5 - 5.0 g/dL   AST 62 (H) 15 - 41 U/L   ALT 30 17 - 63 U/L   Alkaline Phosphatase 153 (H) 38 - 126 U/L   Total Bilirubin 0.8 0.3 - 1.2 mg/dL   GFR calc non Af Amer 54 (L) >60 mL/min   GFR calc Af Amer >60 >60 mL/min    Comment: (NOTE) The eGFR has been calculated using the CKD EPI equation. This calculation has not been validated in all clinical situations. eGFR's persistently <60 mL/min signify possible Chronic Kidney Disease.    Anion gap 7 5 - 15  Ethanol  Status: None   Collection Time: 08/07/15  7:29 PM  Result Value Ref Range   Alcohol, Ethyl (B) <5 <5 mg/dL    Comment:        LOWEST DETECTABLE LIMIT FOR SERUM ALCOHOL IS 5 mg/dL FOR MEDICAL PURPOSES ONLY   Urine rapid drug screen (hosp performed)     Status: Abnormal   Collection Time: 08/08/15  1:47 AM  Result Value Ref Range   Opiates NONE DETECTED NONE DETECTED   Cocaine POSITIVE (A) NONE DETECTED   Benzodiazepines NONE DETECTED NONE DETECTED   Amphetamines NONE DETECTED NONE DETECTED   Tetrahydrocannabinol NONE DETECTED NONE DETECTED   Barbiturates NONE DETECTED NONE DETECTED    Comment:        DRUG SCREEN FOR MEDICAL PURPOSES ONLY.  IF CONFIRMATION IS NEEDED FOR ANY PURPOSE, NOTIFY LAB WITHIN 5 DAYS.        LOWEST DETECTABLE LIMITS FOR URINE DRUG SCREEN Drug Class       Cutoff (ng/mL) Amphetamine      1000 Barbiturate      200 Benzodiazepine   350 Tricyclics       093 Opiates          300 Cocaine          300 THC              50     Current Facility-Administered Medications  Medication Dose Route Frequency Provider Last Rate Last Dose  . acetaminophen (TYLENOL) tablet 650 mg  650 mg Oral Q4H PRN Lacretia Leigh, MD      . alum & mag hydroxide-simeth (MAALOX/MYLANTA) 200-200-20 MG/5ML suspension 30 mL  30 mL Oral PRN Lacretia Leigh, MD      . ibuprofen (ADVIL,MOTRIN) tablet 600 mg  600 mg Oral Q8H  PRN Lacretia Leigh, MD      . LORazepam (ATIVAN) tablet 0-4 mg  0-4 mg Oral Q6H Lacretia Leigh, MD   1 mg at 08/08/15 1130   Followed by  . [START ON 08/09/2015] LORazepam (ATIVAN) tablet 0-4 mg  0-4 mg Oral Q12H Lacretia Leigh, MD      . nicotine (NICODERM CQ - dosed in mg/24 hours) patch 21 mg  21 mg Transdermal Daily Lacretia Leigh, MD   21 mg at 08/08/15 1129  . nystatin (MYCOSTATIN) powder   Topical BID Lacretia Leigh, MD      . ondansetron [facility]) tablet 4 mg  4 mg Oral Q8H PRN Lacretia Leigh, MD      . zolpidem [facility]) tablet 5 mg  5 mg Oral QHS PRN Lacretia Leigh, MD       Current Outpatient Prescriptions  Medication Sig Dispense Refill  . haloperidol (HALDOL) 5 MG tablet Take 1 tablet (5 mg total) by mouth 2 (two) times daily. For mood control 60 tablet 0  . benztropine (COGENTIN) 0.5 MG tablet Take 1 tablet (0.5 mg total) by mouth 2 (two) times daily. For prevention of drug induced tremors 60 tablet 0  . FLUoxetine (PROZAC) 20 MG capsule Take 1 capsule (20 mg total) by mouth daily. For depression 30 capsule 0  . hydrOXYzine (ATARAX/VISTARIL) 25 MG tablet Take 1 tablet (25 mg) every 6 hours as needed: Foe anxiety/sleep 60 tablet 0  . lisinopril (PRINIVIL,ZESTRIL) 10 MG tablet Take 1 tablet (10 mg total) by mouth daily. For high blood pressure 30 tablet 0  . Multiple Vitamin (MULTIVITAMIN WITH MINERALS) TABS tablet Take 1 tablet by mouth daily. May purchase over the counter: As a Vitamin supplement    .  nicotine (NICODERM CQ - DOSED IN MG/24 HOURS) 21 mg/24hr patch Place 1 patch (21 mg total) onto the skin daily. For smoking cessation 28 patch 0  . traZODone (DESYREL) 100 MG tablet Take 1 tablet (100 mg total) by mouth at bedtime. For insomnia 30 tablet 0    Musculoskeletal: Strength & Muscle Tone: increased Gait & Station: normal Patient leans: N/A  Psychiatric Specialty Exam: ROS  No Fever-chills, No Headache, No changes with Vision or hearing, reports vertigo No problems swallowing  food or Liquids, No Chest pain, Cough or Shortness of Breath, No Abdominal pain, No Nausea or Vommitting, Bowel movements are regular, No Blood in stool or Urine, No dysuria, No new skin rashes or bruises, No new joints pains-aches,  No new weakness, tingling, numbness in any extremity, No recent weight gain or loss, No polyuria, polydypsia or polyphagia,  A full 10 point Review of Systems was done, except as stated above, all other Review of Systems were negative.  Blood pressure 117/81, pulse 84, temperature 98.4 F (36.9 C), temperature source Oral, resp. rate 16, height _0  (1.88 m), weight 88.451 kg (195 lb), SpO2 100 %.Body mass index is 25.03 kg/(m^2).  General Appearance: Bizarre and Guarded  Eye Contact::  Good  Speech:  Clear and Coherent  Volume:  Increased  Mood:  Angry and Irritable  Affect:  Labile  Thought Process:  Disorganized and Irrelevant  Orientation:  Full (Time, Place, and Person)  Thought Content:  Rumination  Suicidal Thoughts:  No  Homicidal Thoughts:  Yes.  without intent/plan  Memory:  Immediate;   Fair Recent;   Fair  Judgement:  Impaired  Insight:  Lacking  Psychomotor Activity:  Increased  Concentration:  Fair  Recall:  Poor  Fund of Knowledge:Fair  Language: Good  Akathisia:  Negative  Handed:  Right  AIMS (if indicated):     Assets:  Communication Skills Desire for Improvement Financial Resources/Insurance Housing Leisure Time Resilience Social Support Transportation  ADL's:  Intact  Cognition: WNL  Sleep:      Treatment Plan Summary: Daily contact with patient to assess and evaluate symptoms and progress in treatment and Medication management   Monitor for substance abuse withdrawal symptoms Geodon 10 mg IM Q2hours PRN- maximum two doses in 24 hours for agitation and aggression Ativan 2 mg IM Q6H PRN for agitation and withdrawal Contact nieces for homicidal threats if he continues to endorse after being  sober  Disposition: Recommend psychiatric Inpatient admission when medically cleared. Supportive therapy provided about ongoing stressors.  Durward Parcel., MD 08/08/2015 11:35 AM

## 2015-08-09 NOTE — ED Notes (Signed)
Patient noted in room. No complaints, stable, in no acute distress. Pt not compliant with nursing assessment.  Q15 minute rounds and monitoring via security cameras continue for safety.

## 2015-08-09 NOTE — Progress Notes (Signed)
Keith Murray 55 yo AAM.  PDx: Schizoaffective disorder, bipolar type. Pt on unit and is agitated at shift change.  Pt is loud and threatening with his behavior.  Pt returns to room and is in bed.  Pt given foot medication powder and he applies it himself.  In talking to pt in room, pt is calm and cooperative.  Pt engages in conversation when prompted.  Pt took PO meds without incident and put blanket over head and went to sleep. Pt is safe on unit and continues to be monitored for safety.

## 2015-08-10 DIAGNOSIS — F1494 Cocaine use, unspecified with cocaine-induced mood disorder: Secondary | ICD-10-CM | POA: Diagnosis present

## 2015-08-10 DIAGNOSIS — F1490 Cocaine use, unspecified, uncomplicated: Secondary | ICD-10-CM | POA: Diagnosis present

## 2015-08-10 DIAGNOSIS — F250 Schizoaffective disorder, bipolar type: Secondary | ICD-10-CM | POA: Diagnosis not present

## 2015-08-10 NOTE — BHH Suicide Risk Assessment (Signed)
Suicide Risk Assessment  Discharge Assessment   [facility] Discharge Suicide Risk Assessment   Principal Problem: Cocaine-induced mood disorder [facility]) Discharge Diagnoses:  Patient Active Problem List   Diagnosis Date Noted  . Cocaine abuse [F14.10] 08/10/2015    Priority: High  . Cocaine-induced mood disorder ([place]) [F14.94] 08/10/2015    Priority: High  . Alcohol dependence ([place]) [F10.20] 06/08/2013    Priority: Low  . Polysubstance (excluding opioids) dependence ([place]) [F19.20] 08/08/2012    Priority: Low  . Alcohol abuse with intoxication ([place]) [F10.129] 04/14/2015  . Cocaine abuse with cocaine-induced mood disorder ([place]) [F14.14] 04/14/2015  . Psychoses [F29]   . Schizoaffective disorder, bipolar type ([place]) [F25.0]     Total Time spent with patient: 30 minutes  Musculoskeletal: Strength & Muscle Tone: within normal limits Gait & Station: normal Patient leans: N/A  Psychiatric Specialty Exam: Review of Systems  Constitutional: Negative.   HENT: Negative.   Eyes: Negative.   Respiratory: Negative.   Cardiovascular: Negative.   Gastrointestinal: Negative.   Genitourinary: Negative.   Musculoskeletal: Negative.   Skin: Negative.   Neurological: Negative.   Endo/Heme/Allergies: Negative.   Psychiatric/Behavioral: Positive for substance abuse.    Blood pressure 113/78, pulse 87, temperature 98.7 F (37.1 C), temperature source Oral, resp. rate 16, height '6\' 2"'$  (1.88 m), weight 88.451 kg (195 lb), SpO2 100 %.Body mass index is 25.03 kg/(m^2).  General Appearance: Casual  Eye Contact::  Good  Speech:  Normal Rate  Volume:  Normal  Mood:  Euthymic  Affect:  Congruent  Thought Process:  Coherent  Orientation:  Full (Time, Place, and Person)  Thought Content:  WDL  Suicidal Thoughts:  No  Homicidal Thoughts:  No  Memory:  Immediate;   Good Recent;   Good Remote;   Good  Judgement:  Fair  Insight:  Fair  Psychomotor Activity:  Normal  Concentration:  Good  Recall:   Good  Fund of Knowledge:Good  Language: Good  Akathisia:  No  Handed:  Right  AIMS (if indicated):     Assets:  Housing Leisure Time Physical Health Resilience Social Support  ADL's:  Intact  Cognition: WNL  Sleep:       Mental Status Per Nursing Assessment::   On Admission:    cocaine abuse with agitation  Demographic Factors:  Male  Loss Factors: NA  Historical Factors: NA  Risk Reduction Factors:   Sense of responsibility to family, Living with another person, especially a relative, Positive social support and Positive therapeutic relationship  Continued Clinical Symptoms:  None  Cognitive Features That Contribute To Risk:  None    Suicide Risk:  Minimal: No identifiable suicidal ideation.  Patients presenting with no risk factors but with morbid ruminations; may be classified as minimal risk based on the severity of the depressive symptoms    Plan Of Care/Follow-up recommendations:  Activity:  as tolerated Diet:  heart healthy diet  LORD, JAMISON, NP 08/10/2015, 10:14 AM

## 2015-08-10 NOTE — ED Notes (Signed)
Patient discharge to home.  Denies thoughts of harm to self or others.  States he has spoken with family members and resolved issues which led to his hospitalization.  Left the unit ambulatory with all belongings.  He was given a [place] bus pass for transportation home.  He verbalized understanding of all discharge instructions and recommendations for follow up care.

## 2015-08-10 NOTE — Discharge Instructions (Signed)
For your ongoing behavioral health and substance abuse treatment needs, you are advised to follow up with the [facility].  Contact them at your earliest opportunity to ask about scheduling an intake appointment:       [facility]      [address] [place], [place] [address]      [phone]

## 2015-08-10 NOTE — BH Assessment (Signed)
[place] Assessment Progress Note  Per Corena Pilgrim, MD, this pt does not require psychiatric hospitalization at this time.  He presents under IVC initiated by EDP Lacretia Leigh, MD.  Pt is to be released from [facility] and discharged from [facility] with outpatient referrals.  IVC has been rescinded.  Discharge instructions advise pt to follow up with the [facility].  Pt's nurse, Dawnaly, has been notified.  Jalene Mullet, [place] Triage Specialist [phone]

## 2015-08-10 NOTE — Consult Note (Signed)
[facility] Consult   Reason for Consult:  Cocaine abuse with agitation Referring Physician:  EDP Patient Identification: Keith Murray MRN:  [id number] Principal Diagnosis: Cocaine-induced mood disorder ([place]) Diagnosis:   Patient Active Problem List   Diagnosis Date Noted  . Cocaine abuse [F14.10] 08/10/2015    Priority: High  . Cocaine-induced mood disorder ([place]) [F14.94] 08/10/2015    Priority: High  . Alcohol dependence ([place]) [F10.20] 06/08/2013    Priority: Low  . Polysubstance (excluding opioids) dependence ([place]) [F19.20] 08/08/2012    Priority: Low  . Alcohol abuse with intoxication ([place]) [F10.129] 04/14/2015  . Cocaine abuse with cocaine-induced mood disorder ([place]) [F14.14] 04/14/2015  . Psychoses [F29]   . Schizoaffective disorder, bipolar type ([place]) [F25.0]     Total Time spent with patient: 30 minutes  Subjective:   Keith Murray is a 55 y.o. male patient does not warrant admission.  HPI:  55 yo male who presented to the ED after cocaine abuse with agitation.  He was restarted on his medications and has stabilized.  Denies suicidal/homicidal ideations, hallucinations, and withdrawal symptoms.  Pleasant and cooperative.  He wants to leave with his niece who he lives with, stable for discharge.    Past Psychiatric History: schizoaffective disorder, substance abuse  Risk to Self: Suicidal Ideation: No Suicidal Intent: No Is patient at risk for suicide?: No Suicidal Plan?: No Access to Means: No What has been your use of drugs/alcohol within the last 12 months?: Pt denies  How many times?:  (UTA) Other Self Harm Risks:  (UTA) Intentional Self Injurious Behavior:  (UTA) Risk to Others: Homicidal Ideation: Yes-Currently Present Thoughts of Harm to Others: Yes-Currently Present Comment - Thoughts of Harm to Others: "I am going to make sure they die"  Current Homicidal Intent: Yes-Currently Present Current Homicidal Plan: Yes-Currently Present Describe  Current Homicidal Plan: "I am going to make sure they die with my fist Access to Homicidal Means: Yes Describe Access to Homicidal Means: physical violence  Identified Victim: Niece  (Pt did not provide a specific name. Only stated "my niece". ) History of harm to others?:  (unknown ) Assessment of Violence: None Noted Violent Behavior Description: No violent behaviors observed.  Does patient have access to weapons?:  (UTA) Criminal Charges Pending?: No Does patient have a court date: No Prior Inpatient Therapy: Prior Inpatient Therapy: Yes Prior Therapy Dates: 2012, 2014, 2015, 2017 Prior Therapy Facilty/Provider(s): [facility]  Reason for Treatment: Schizoaffective disorder  Prior Outpatient Therapy: Prior Outpatient Therapy:  (UTA ) Does patient have an ACCT team?: Unknown Does patient have Intensive In-House Services?  : No Does patient have [facility] services? : Unknown Does patient have P4CC services?: Unknown  Past Medical History:  Past Medical History  Diagnosis Date  . Anxiety   . Schizoaffective disorder ([place])   . Polysubstance abuse     cocaine, etoh, marijuana  . Psychosis     Past Surgical History  Procedure Laterality Date  . Hernia repair     Family History: No family history on file. Family Psychiatric  History: none Social History:  History  Alcohol Use  . Yes    Comment: reports not drinking any longer     History  Drug Use  . Yes  . Special: Cocaine, Marijuana    Comment: THC about once per week and cocaine on occasion.     Social History   Social History  . Marital Status: Single    Spouse Name: N/A  . Number of  Children: N/A  . Years of Education: N/A   Social History Main Topics  . Smoking status: Current Every Day Smoker -- 0.50 packs/day for 4 years  . Smokeless tobacco: Not on file  . Alcohol Use: Yes     Comment: reports not drinking any longer  . Drug Use: Yes    Special: Cocaine, Marijuana     Comment: THC about once per week and  cocaine on occasion.   . Sexual Activity: Yes    Birth Control/ Protection: Condom   Other Topics Concern  . Not on file   Social History Narrative   Additional Social History:    Allergies:  No Known Allergies  Labs: No results found for this or any previous visit (from the past 48 hour(s)).  Current Facility-Administered Medications  Medication Dose Route Frequency Provider Last Rate Last Dose  . acetaminophen (TYLENOL) tablet 650 mg  650 mg Oral Q4H PRN Lacretia Leigh, MD      . alum & mag hydroxide-simeth (MAALOX/MYLANTA) 200-200-20 MG/5ML suspension 30 mL  30 mL Oral PRN Lacretia Leigh, MD      . benztropine (COGENTIN) tablet 0.5 mg  0.5 mg Oral BID Ambrose Finland, MD   0.5 mg at 08/10/15 0927  . haloperidol (HALDOL) tablet 5 mg  5 mg Oral BID Ambrose Finland, MD   5 mg at 08/10/15 3570  . ibuprofen (ADVIL,MOTRIN) tablet 600 mg  600 mg Oral Q8H PRN Lacretia Leigh, MD      . lisinopril (PRINIVIL,ZESTRIL) tablet 10 mg  10 mg Oral Daily Ambrose Finland, MD   10 mg at 08/10/15 0926  . LORazepam (ATIVAN) tablet 0-4 mg  0-4 mg Oral Q12H Lacretia Leigh, MD   1 mg at 08/09/15 1821  . nicotine (NICODERM CQ - dosed in mg/24 hours) patch 21 mg  21 mg Transdermal Daily Lacretia Leigh, MD   21 mg at 08/10/15 0928  . nystatin (MYCOSTATIN) powder   Topical BID Lacretia Leigh, MD      . ondansetron [facility]) tablet 4 mg  4 mg Oral Q8H PRN Lacretia Leigh, MD      . traZODone (DESYREL) tablet 50 mg  50 mg Oral QHS Ambrose Finland, MD   50 mg at 08/09/15 2125  . zolpidem (AMBIEN) tablet 5 mg  5 mg Oral QHS PRN Lacretia Leigh, MD       Current Outpatient Prescriptions  Medication Sig Dispense Refill  . benztropine (COGENTIN) 0.5 MG tablet Take 1 tablet (0.5 mg total) by mouth 2 (two) times daily. For prevention of drug induced tremors 60 tablet 0  . haloperidol (HALDOL) 5 MG tablet Take 1 tablet (5 mg total) by mouth 2 (two) times daily. For mood control 60 tablet 0  .  hydrOXYzine (ATARAX/VISTARIL) 25 MG tablet Take 1 tablet (25 mg) every 6 hours as needed: Foe anxiety/sleep 60 tablet 0  . Multiple Vitamin (MULTIVITAMIN WITH MINERALS) TABS tablet Take 1 tablet by mouth daily. May purchase over the counter: As a Vitamin supplement    . traZODone (DESYREL) 100 MG tablet Take 1 tablet (100 mg total) by mouth at bedtime. For insomnia 30 tablet 0  . FLUoxetine (PROZAC) 20 MG capsule Take 1 capsule (20 mg total) by mouth daily. For depression (Patient not taking: Reported on 08/08/2015) 30 capsule 0  . lisinopril (PRINIVIL,ZESTRIL) 10 MG tablet Take 1 tablet (10 mg total) by mouth daily. For high blood pressure (Patient not taking: Reported on 08/08/2015) 30 tablet 0  . nicotine (NICODERM CQ -  DOSED IN MG/24 HOURS) 21 mg/24hr patch Place 1 patch (21 mg total) onto the skin daily. For smoking cessation (Patient not taking: Reported on 08/08/2015) 28 patch 0    Musculoskeletal: Strength & Muscle Tone: within normal limits Gait & Station: normal Patient leans: N/A  Psychiatric Specialty Exam: Review of Systems  Constitutional: Negative.   HENT: Negative.   Eyes: Negative.   Respiratory: Negative.   Cardiovascular: Negative.   Gastrointestinal: Negative.   Genitourinary: Negative.   Musculoskeletal: Negative.   Skin: Negative.   Neurological: Negative.   Endo/Heme/Allergies: Negative.   Psychiatric/Behavioral: Positive for substance abuse.    Blood pressure 113/78, pulse 87, temperature 98.7 F (37.1 C), temperature source Oral, resp. rate 16, height '6\' 2"'$  (1.88 m), weight 88.451 kg (195 lb), SpO2 100 %.Body mass index is 25.03 kg/(m^2).  General Appearance: Casual  Eye Contact::  Good  Speech:  Normal Rate  Volume:  Normal  Mood:  Euthymic  Affect:  Congruent  Thought Process:  Coherent  Orientation:  Full (Time, Place, and Person)  Thought Content:  WDL  Suicidal Thoughts:  No  Homicidal Thoughts:  No  Memory:  Immediate;   Good Recent;    Good Remote;   Good  Judgement:  Fair  Insight:  Fair  Psychomotor Activity:  Normal  Concentration:  Good  Recall:  Good  Fund of Knowledge:Good  Language: Good  Akathisia:  No  Handed:  Right  AIMS (if indicated):     Assets:  Housing Leisure Time Physical Health Resilience Social Support  ADL's:  Intact  Cognition: WNL  Sleep:      Treatment Plan Summary: Daily contact with patient to assess and evaluate symptoms and progress in treatment, Medication management and Plan Cocaine induced mood disorder:  -Crisis stabilization -Medication management:  Restart home medications including Haldol 5 mg BID for psychosis and Cogentin 0.5 mg BID for EPS -Individual and substance abuse counseling  Disposition: No evidence of imminent risk to self or others at present.    Waylan Boga, NP 08/10/2015 10:09 AM Patient seen face-to-face for psychiatric evaluation, chart reviewed and case discussed with the physician extender and developed treatment plan. Reviewed the information documented and agree with the treatment plan. Corena Pilgrim, MD

## 2015-10-29 ENCOUNTER — Other Ambulatory Visit: Payer: Self-pay | Admitting: Internal Medicine

## 2015-10-29 DIAGNOSIS — I10 Essential (primary) hypertension: Secondary | ICD-10-CM | POA: Diagnosis not present

## 2015-10-29 DIAGNOSIS — Z136 Encounter for screening for cardiovascular disorders: Secondary | ICD-10-CM | POA: Diagnosis not present

## 2015-10-29 DIAGNOSIS — Z0000 Encounter for general adult medical examination without abnormal findings: Secondary | ICD-10-CM | POA: Diagnosis not present

## 2015-10-29 DIAGNOSIS — Z01118 Encounter for examination of ears and hearing with other abnormal findings: Secondary | ICD-10-CM | POA: Diagnosis not present

## 2015-10-29 DIAGNOSIS — Z131 Encounter for screening for diabetes mellitus: Secondary | ICD-10-CM | POA: Diagnosis not present

## 2015-10-29 DIAGNOSIS — R918 Other nonspecific abnormal finding of lung field: Secondary | ICD-10-CM

## 2015-10-29 DIAGNOSIS — Z720 Tobacco use: Secondary | ICD-10-CM | POA: Diagnosis not present

## 2015-10-30 DIAGNOSIS — Z720 Tobacco use: Secondary | ICD-10-CM | POA: Diagnosis not present

## 2015-10-30 DIAGNOSIS — Z0000 Encounter for general adult medical examination without abnormal findings: Secondary | ICD-10-CM | POA: Diagnosis not present

## 2015-10-30 DIAGNOSIS — I10 Essential (primary) hypertension: Secondary | ICD-10-CM | POA: Diagnosis not present

## 2015-10-30 DIAGNOSIS — R918 Other nonspecific abnormal finding of lung field: Secondary | ICD-10-CM | POA: Diagnosis not present

## 2015-10-31 ENCOUNTER — Emergency Department (HOSPITAL_COMMUNITY): Payer: Medicare Other

## 2015-10-31 ENCOUNTER — Inpatient Hospital Stay (HOSPITAL_COMMUNITY)
Admission: EM | Admit: 2015-10-31 | Discharge: 2015-11-07 | DRG: 987 | Disposition: A | Discharge status: Home Health | Payer: Medicare Other | Attending: Internal Medicine | Admitting: Internal Medicine

## 2015-10-31 ENCOUNTER — Encounter (HOSPITAL_COMMUNITY): Payer: Self-pay | Admitting: Emergency Medicine

## 2015-10-31 DIAGNOSIS — C801 Malignant (primary) neoplasm, unspecified: Secondary | ICD-10-CM | POA: Diagnosis not present

## 2015-10-31 DIAGNOSIS — R079 Chest pain, unspecified: Secondary | ICD-10-CM | POA: Diagnosis not present

## 2015-10-31 DIAGNOSIS — F1920 Other psychoactive substance dependence, uncomplicated: Secondary | ICD-10-CM | POA: Diagnosis not present

## 2015-10-31 DIAGNOSIS — C3490 Malignant neoplasm of unspecified part of unspecified bronchus or lung: Secondary | ICD-10-CM | POA: Diagnosis not present

## 2015-10-31 DIAGNOSIS — E875 Hyperkalemia: Secondary | ICD-10-CM | POA: Diagnosis not present

## 2015-10-31 DIAGNOSIS — R222 Localized swelling, mass and lump, trunk: Secondary | ICD-10-CM | POA: Diagnosis not present

## 2015-10-31 DIAGNOSIS — C7801 Secondary malignant neoplasm of right lung: Secondary | ICD-10-CM | POA: Diagnosis not present

## 2015-10-31 DIAGNOSIS — R1900 Intra-abdominal and pelvic swelling, mass and lump, unspecified site: Secondary | ICD-10-CM

## 2015-10-31 DIAGNOSIS — F1410 Cocaine abuse, uncomplicated: Secondary | ICD-10-CM | POA: Diagnosis present

## 2015-10-31 DIAGNOSIS — C781 Secondary malignant neoplasm of mediastinum: Secondary | ICD-10-CM | POA: Diagnosis present

## 2015-10-31 DIAGNOSIS — F17210 Nicotine dependence, cigarettes, uncomplicated: Secondary | ICD-10-CM | POA: Diagnosis present

## 2015-10-31 DIAGNOSIS — R918 Other nonspecific abnormal finding of lung field: Secondary | ICD-10-CM | POA: Diagnosis not present

## 2015-10-31 DIAGNOSIS — R0602 Shortness of breath: Secondary | ICD-10-CM

## 2015-10-31 DIAGNOSIS — C3411 Malignant neoplasm of upper lobe, right bronchus or lung: Secondary | ICD-10-CM | POA: Diagnosis present

## 2015-10-31 DIAGNOSIS — F419 Anxiety disorder, unspecified: Secondary | ICD-10-CM | POA: Diagnosis present

## 2015-10-31 DIAGNOSIS — C792 Secondary malignant neoplasm of skin: Secondary | ICD-10-CM | POA: Diagnosis not present

## 2015-10-31 DIAGNOSIS — D72829 Elevated white blood cell count, unspecified: Secondary | ICD-10-CM | POA: Diagnosis not present

## 2015-10-31 DIAGNOSIS — C3491 Malignant neoplasm of unspecified part of right bronchus or lung: Secondary | ICD-10-CM | POA: Diagnosis not present

## 2015-10-31 DIAGNOSIS — C7989 Secondary malignant neoplasm of other specified sites: Secondary | ICD-10-CM | POA: Diagnosis not present

## 2015-10-31 DIAGNOSIS — J90 Pleural effusion, not elsewhere classified: Secondary | ICD-10-CM | POA: Diagnosis present

## 2015-10-31 DIAGNOSIS — F250 Schizoaffective disorder, bipolar type: Secondary | ICD-10-CM | POA: Diagnosis present

## 2015-10-31 DIAGNOSIS — J189 Pneumonia, unspecified organism: Secondary | ICD-10-CM | POA: Diagnosis not present

## 2015-10-31 DIAGNOSIS — M7989 Other specified soft tissue disorders: Secondary | ICD-10-CM | POA: Diagnosis present

## 2015-10-31 DIAGNOSIS — Z79899 Other long term (current) drug therapy: Secondary | ICD-10-CM | POA: Diagnosis not present

## 2015-10-31 DIAGNOSIS — R634 Abnormal weight loss: Secondary | ICD-10-CM | POA: Diagnosis not present

## 2015-10-31 DIAGNOSIS — G893 Neoplasm related pain (acute) (chronic): Secondary | ICD-10-CM | POA: Diagnosis present

## 2015-10-31 DIAGNOSIS — F1020 Alcohol dependence, uncomplicated: Secondary | ICD-10-CM | POA: Diagnosis present

## 2015-10-31 LAB — COMPREHENSIVE METABOLIC PANEL
ALT: 10 U/L — ABNORMAL LOW (ref 17–63)
AST: 13 U/L — ABNORMAL LOW (ref 15–41)
Albumin: 2.9 g/dL — ABNORMAL LOW (ref 3.5–5.0)
Alkaline Phosphatase: 201 U/L — ABNORMAL HIGH (ref 38–126)
Anion gap: 7 (ref 5–15)
BUN: 14 mg/dL (ref 6–20)
CO2: 21 mmol/L — ABNORMAL LOW (ref 22–32)
Calcium: 13.2 mg/dL (ref 8.9–10.3)
Chloride: 103 mmol/L (ref 101–111)
Creatinine, Ser: 1.17 mg/dL (ref 0.61–1.24)
GFR calc Af Amer: >60 mL/min (ref >60)
GFR calc non Af Amer: >60 mL/min (ref >60)
Glucose, Bld: 127 mg/dL — ABNORMAL HIGH (ref 65–99)
Potassium: 3.4 mmol/L — ABNORMAL LOW (ref 3.5–5.1)
Sodium: 131 mmol/L — ABNORMAL LOW (ref 135–145)
Total Bilirubin: 0.4 mg/dL (ref 0.3–1.2)
Total Protein: 7.3 g/dL (ref 6.5–8.1)

## 2015-10-31 LAB — URINALYSIS, ROUTINE W REFLEX MICROSCOPIC
Bilirubin Urine: NEGATIVE
Glucose, UA: NEGATIVE mg/dL
Hgb urine dipstick: NEGATIVE
Ketones, ur: NEGATIVE mg/dL
Leukocytes, UA: NEGATIVE
Nitrite: NEGATIVE
Protein, ur: NEGATIVE mg/dL
Specific Gravity, Urine: 1.013 (ref 1.005–1.030)
pH: 6 (ref 5.0–8.0)

## 2015-10-31 LAB — CBC WITH DIFFERENTIAL/PLATELET
Basophils Absolute: 0 10*3/uL (ref 0.0–0.1)
Basophils Relative: 0 %
Eosinophils Absolute: 0 10*3/uL (ref 0.0–0.7)
Eosinophils Relative: 0 %
HCT: 29.6 % — ABNORMAL LOW (ref 39.0–52.0)
Hemoglobin: 9.9 g/dL — ABNORMAL LOW (ref 13.0–17.0)
Lymphocytes Relative: 6 %
Lymphs Abs: 2.7 10*3/uL (ref 0.7–4.0)
MCH: 28.6 pg (ref 26.0–34.0)
MCHC: 33.4 g/dL (ref 30.0–36.0)
MCV: 85.5 fL (ref 78.0–100.0)
Monocytes Absolute: 1.4 10*3/uL — ABNORMAL HIGH (ref 0.1–1.0)
Monocytes Relative: 3 %
Neutro Abs: 41.4 10*3/uL — ABNORMAL HIGH (ref 1.7–7.7)
Neutrophils Relative %: 91 %
Platelets: 438 10*3/uL — ABNORMAL HIGH (ref 150–400)
RBC: 3.46 MIL/uL — ABNORMAL LOW (ref 4.22–5.81)
RDW: 14.7 % (ref 11.5–15.5)
WBC: 45.5 10*3/uL — ABNORMAL HIGH (ref 4.0–10.5)

## 2015-10-31 LAB — CBC
HCT: 31.7 % — ABNORMAL LOW (ref 39.0–52.0)
Hemoglobin: 10.4 g/dL — ABNORMAL LOW (ref 13.0–17.0)
MCH: 28.4 pg (ref 26.0–34.0)
MCHC: 32.8 g/dL (ref 30.0–36.0)
MCV: 86.6 fL (ref 78.0–100.0)
Platelets: 391 10*3/uL (ref 150–400)
RBC: 3.66 MIL/uL — ABNORMAL LOW (ref 4.22–5.81)
RDW: 14.9 % (ref 11.5–15.5)
WBC: 40.7 10*3/uL — ABNORMAL HIGH (ref 4.0–10.5)

## 2015-10-31 LAB — ETHANOL: Alcohol, Ethyl (B): <5 mg/dL (ref <5)

## 2015-10-31 LAB — LIPASE, BLOOD: Lipase: 16 U/L (ref 11–51)

## 2015-10-31 MED ORDER — BENZTROPINE MESYLATE 0.5 MG PO TABS
0.5000 mg | ORAL_TABLET | Freq: Two times a day (BID) | ORAL | Status: DC
  Administered 2015-10-31 – 2015-11-07 (×14): 0.5 mg via ORAL
  Filled 2015-10-31 (×14): qty 1

## 2015-10-31 MED ORDER — TRAZODONE HCL 50 MG PO TABS
100.0000 mg | ORAL_TABLET | Freq: Every day | ORAL | Status: DC
  Administered 2015-10-31 – 2015-11-06 (×7): 100 mg via ORAL
  Filled 2015-10-31 (×7): qty 2

## 2015-10-31 MED ORDER — SODIUM CHLORIDE 0.9% FLUSH
3.0000 mL | Freq: Two times a day (BID) | INTRAVENOUS | Status: DC
  Administered 2015-11-04: 3 mL via INTRAVENOUS

## 2015-10-31 MED ORDER — CALCITONIN (SALMON) 200 UNIT/ML IJ SOLN
300.0000 [IU] | Freq: Two times a day (BID) | INTRAMUSCULAR | Status: AC
  Administered 2015-10-31 – 2015-11-02 (×4): 300 [IU] via SUBCUTANEOUS
  Filled 2015-10-31 (×5): qty 1.5

## 2015-10-31 MED ORDER — SODIUM CHLORIDE 0.9 % IV SOLN
90.0000 mg | Freq: Once | INTRAVENOUS | Status: AC
  Administered 2015-10-31: 90 mg via INTRAVENOUS
  Filled 2015-10-31: qty 10

## 2015-10-31 MED ORDER — SODIUM CHLORIDE 0.9 % IV BOLUS (SEPSIS)
1000.0000 mL | Freq: Once | INTRAVENOUS | Status: AC
  Administered 2015-10-31: 1000 mL via INTRAVENOUS

## 2015-10-31 MED ORDER — HYDROCODONE-ACETAMINOPHEN 5-325 MG PO TABS
1.0000 | ORAL_TABLET | ORAL | Status: DC | PRN
  Administered 2015-11-04: 1 via ORAL
  Administered 2015-11-04 – 2015-11-06 (×8): 2 via ORAL
  Filled 2015-10-31 (×4): qty 2
  Filled 2015-10-31: qty 1
  Filled 2015-10-31 (×4): qty 2

## 2015-10-31 MED ORDER — NICOTINE 21 MG/24HR TD PT24
21.0000 mg | MEDICATED_PATCH | Freq: Every day | TRANSDERMAL | Status: DC
  Administered 2015-11-01 – 2015-11-07 (×7): 21 mg via TRANSDERMAL
  Filled 2015-10-31 (×9): qty 1

## 2015-10-31 MED ORDER — ONDANSETRON HCL 4 MG PO TABS: 4.0000 mg | ORAL_TABLET | Freq: Four times a day (QID) | ORAL | Status: DC | PRN

## 2015-10-31 MED ORDER — ONDANSETRON HCL 4 MG/2ML IJ SOLN: 4.0000 mg | Freq: Four times a day (QID) | INTRAMUSCULAR | Status: DC | PRN

## 2015-10-31 MED ORDER — SODIUM CHLORIDE 0.9 % IV SOLN
INTRAVENOUS | Status: AC
  Administered 2015-10-31 – 2015-11-01 (×2): via INTRAVENOUS

## 2015-10-31 MED ORDER — ACETAMINOPHEN 325 MG PO TABS
650.0000 mg | ORAL_TABLET | Freq: Four times a day (QID) | ORAL | Status: DC | PRN
  Filled 2015-10-31: qty 2

## 2015-10-31 MED ORDER — HALOPERIDOL 5 MG PO TABS
5.0000 mg | ORAL_TABLET | Freq: Two times a day (BID) | ORAL | Status: DC
  Administered 2015-10-31: 5 mg via ORAL
  Filled 2015-10-31 (×2): qty 1

## 2015-10-31 MED ORDER — FLUOXETINE HCL 20 MG PO CAPS
20.0000 mg | ORAL_CAPSULE | Freq: Every day | ORAL | Status: DC
  Administered 2015-10-31 – 2015-11-07 (×8): 20 mg via ORAL
  Filled 2015-10-31 (×8): qty 1

## 2015-10-31 MED ORDER — IOPAMIDOL (ISOVUE-300) INJECTION 61%
80.0000 mL | Freq: Once | INTRAVENOUS | Status: AC | PRN
  Administered 2015-10-31: 80 mL via INTRAVENOUS

## 2015-10-31 MED ORDER — ACETAMINOPHEN 650 MG RE SUPP: 650.0000 mg | Freq: Four times a day (QID) | RECTAL | Status: DC | PRN

## 2015-10-31 MED ORDER — HYDROXYZINE HCL 25 MG PO TABS
25.0000 mg | ORAL_TABLET | Freq: Three times a day (TID) | ORAL | Status: DC | PRN
  Administered 2015-11-04: 25 mg via ORAL
  Filled 2015-10-31: qty 1

## 2015-10-31 MED ORDER — PIPERACILLIN-TAZOBACTAM 3.375 G IVPB
3.3750 g | Freq: Three times a day (TID) | INTRAVENOUS | Status: DC
Start: 2015-10-31 — End: 2015-11-04
  Administered 2015-10-31 – 2015-11-04 (×11): 3.375 g via INTRAVENOUS
  Filled 2015-10-31 (×12): qty 50

## 2015-10-31 NOTE — ED Notes (Signed)
Baxter Flattery- niece [phone]

## 2015-10-31 NOTE — ED Triage Notes (Signed)
Per niece PCP sent for evaluation for elevated calcium and WBC; pt recent DG showing "spot of right lung needing CT."

## 2015-10-31 NOTE — ED Provider Notes (Signed)
[place] DEPT Provider Note   CSN: [id number] Arrival date & time: 10/31/15  1232  First Provider Contact:  None       History   Chief Complaint Chief Complaint  Patient presents with  . Abnormal Lab    HPI Keith Murray is a 55 y.o. male.  HPI   Pt is a 55 year old male with a history of polysubstance abuse, schizoaffective disorder who presents the ED from his PCP for elevated calcium and for a CT scan of his chest. Patient was here in the ED on 07/27/2015 for right-sided chest pain at that time and chest x-ray was done and a right upper lobe mass was noted. Today patient is complaining of right-sided chest pain for months that is 9/10, sharp, nonradiating. No associated symptoms. He denies fever, abdominal pain, headache, visual changes, nausea, vomiting. Patient not very cooperative during exam. Pt told me to call his niece and talk to her.   4:41 PM Spoke with niece [name] and she informed me that the pt has not seen his PCP and she brought him in today because of a mass on his abdomin. Niece also states recent weight loss.    Past Medical History:  Diagnosis Date  . Anxiety   . Polysubstance abuse    cocaine, etoh, marijuana  . Psychosis   . Schizoaffective disorder [facility])     Patient Active Problem List   Diagnosis Date Noted  . Hypercalcemia of malignancy 10/31/2015  . Lung mass 10/31/2015  . Postobstructive pneumonia 10/31/2015  . Hypercalcemia 10/31/2015  . Cocaine abuse 08/10/2015  . Cocaine-induced mood disorder ([place]) 08/10/2015  . Alcohol abuse with intoxication ([place]) 04/14/2015  . Cocaine abuse with cocaine-induced mood disorder ([place]) 04/14/2015  . Psychoses   . Schizoaffective disorder, bipolar type ([place])   . Alcohol dependence ([place]) 06/08/2013  . Polysubstance (excluding opioids) dependence ([place]) 08/08/2012    Past Surgical History:  Procedure Laterality Date  . HERNIA REPAIR         Home Medications    Prior to Admission  medications   Medication Sig Start Date End Date Taking? Authorizing Provider  benztropine (COGENTIN) 0.5 MG tablet Take 1 tablet (0.5 mg total) by mouth 2 (two) times daily. For prevention of drug induced tremors 04/15/15  Yes Encarnacion Slates, NP  haloperidol (HALDOL) 5 MG tablet Take 1 tablet (5 mg total) by mouth 2 (two) times daily. For mood control 04/15/15  Yes Encarnacion Slates, NP  traZODone (DESYREL) 100 MG tablet Take 1 tablet (100 mg total) by mouth at bedtime. For insomnia 04/15/15  Yes Encarnacion Slates, NP  hydrOXYzine (ATARAX/VISTARIL) 25 MG tablet Take 1 tablet (25 mg) every 6 hours as needed: Foe anxiety/sleep 04/15/15   Encarnacion Slates, NP  Multiple Vitamin (MULTIVITAMIN WITH MINERALS) TABS tablet Take 1 tablet by mouth daily. May purchase over the counter: As a Vitamin supplement 04/15/15   Encarnacion Slates, NP    Family History History reviewed. No pertinent family history.  Social History Social History  Substance Use Topics  . Smoking status: Current Every Day Smoker    Packs/day: 0.50    Years: 4.00  . Smokeless tobacco: Not on file  . Alcohol use Yes     Comment: reports not drinking any longer     Allergies   Review of patient's allergies indicates no known allergies.   Review of Systems Review of Systems  Constitutional: Negative for fever.  Respiratory: Negative for shortness of breath.  Cardiovascular: Positive for chest pain.  Gastrointestinal: Negative for abdominal pain, nausea and vomiting.  Musculoskeletal: Negative for back pain.  Skin: Negative for rash and wound.     Physical Exam Updated Vital Signs BP 111/82 (BP Location: Left Arm)   Pulse 97   Temp 98.2 F (36.8 C) (Oral)   Resp 20   Ht '6\' 2"'$  (1.88 m)   Wt 83 kg   SpO2 98%   BMI 23.50 kg/m   Physical Exam  Constitutional: He appears well-developed and well-nourished. No distress.  HENT:  Head: Normocephalic and atraumatic.  Eyes: Conjunctivae are normal.  Cardiovascular: Normal rate,  regular rhythm and normal heart sounds.  Exam reveals no gallop and no friction rub.   No murmur [place]. Pulmonary/Chest: Effort normal. No accessory muscle usage. No respiratory distress. He has decreased breath sounds in the right upper field. He has no wheezes. He has no rhonchi. He has no rales.  Abdominal: He exhibits mass. He exhibits no distension. There is no tenderness.  Small movable, nontender, soft mass noted to right lower abdomin wall.  Musculoskeletal: Normal range of motion.  Neurological: He is alert. Coordination normal.  Skin: Skin is warm and dry. He is not diaphoretic.  Psychiatric: He has a normal mood and affect. His behavior is normal.  Nursing note and vitals reviewed.    ED Treatments / Results  Labs (all labs ordered are listed, but only abnormal results are displayed) Labs Reviewed  COMPREHENSIVE METABOLIC PANEL - Abnormal; Notable for the following:       Result Value   Sodium 131 (*)    Potassium 3.4 (*)    CO2 21 (*)    Glucose, Bld 127 (*)    Calcium 13.2 (*)    Albumin 2.9 (*)    AST 13 (*)    ALT 10 (*)    Alkaline Phosphatase 201 (*)    All other components within normal limits  CBC - Abnormal; Notable for the following:    WBC 40.7 (*)    RBC 3.66 (*)    Hemoglobin 10.4 (*)    HCT 31.7 (*)    All other components within normal limits  CBC WITH DIFFERENTIAL/PLATELET - Abnormal; Notable for the following:    WBC 45.5 (*)    RBC 3.46 (*)    Hemoglobin 9.9 (*)    HCT 29.6 (*)    Platelets 438 (*)    Neutro Abs 41.4 (*)    Monocytes Absolute 1.4 (*)    All other components within normal limits  LIPASE, BLOOD  URINALYSIS, ROUTINE W REFLEX MICROSCOPIC (NOT AT [facility])  CALCIUM, IONIZED    EKG  EKG Interpretation None       Radiology Dg Chest 2 View  Result Date: 10/31/2015 CLINICAL DATA:  Right chest pain. EXAM: CHEST  2 VIEW COMPARISON:  Jul 27, 2015 FINDINGS: The mass in the right upper chest is significantly larger in the interval  measuring up to 15 cm in cranial caudal dimension today versus 10.6 cm previously. There is associated erosion of multiple ribs which was not seen previously in the adjacent ribs. The heart, hila, mediastinum, left lung, and lower right lung are otherwise unchanged. IMPRESSION: Enlarging right upper lobe pulmonary mass which may be pleural based. There is erosion of multiple right-sided ribs which was not seen previously. A chest CT could better evaluate. Electronically Signed   By: Dorise Bullion III M.D   On: 10/31/2015 13:34   Ct Chest W Contrast  Result Date: 10/31/2015 CLINICAL DATA:  Evaluate large right upper lobe mass. EXAM: CT CHEST WITH CONTRAST TECHNIQUE: Multidetector CT imaging of the chest was performed during intravenous contrast administration. CONTRAST:  65m ISOVUE-300 IOPAMIDOL (ISOVUE-300) INJECTION 61% COMPARISON:  Plain film of the chest from earlier today. FINDINGS: Cardiovascular: Thoracic aorta is normal in caliber and configuration. Heart size is normal. No pericardial effusion. Coronary artery calcifications noted. Mediastinum/Nodes: Conglomerate lymphadenopathy in the right hilum, measuring 3.8 x 2.4 cm. Additional conglomerate lymphadenopathy in the right lower peritracheal space, measuring up to 1.4 cm short axis dimension. Scattered smaller lymph nodes within the upper peritracheal and aortopulmonary window regions. Numerous enlarged and morphologically abnormal lymph nodes are seen within the right axilla. Lungs/Pleura: Massive solid-appearing mass in the right lung apex, measuring approximately 17 x 12 x 13 cm, centered in the right lung apex but extending into the overlying anterior and lateral right chest wall. There are destructive/erosive changes of the right first through fourth ribs. Diffuse ground-glass opacities along the medial and inferior margins of the right upper lobe mass could be edema or lymphangitic carcinomatosis. There is a moderate-sized right pleural  effusion. Left lung is clear. Upper Abdomen: Limited images of the upper abdomen are unremarkable. Musculoskeletal: Destructive changes of the right first through fourth ribs by the right upper lobe mass. No distant osseous metastases seen. As above, the right upper lobe mass extends outward into the anterior and lateral right chest wall. Ill-defined fluid/edema is seen within the lower portions of the right chest wall, likely related to associated lymphatic and/or vascular obstruction. IMPRESSION: 1. Massive solid-appearing mass in the right lung apex, consistent with Pancoast tumor, measuring approximately 17 x 12 x 13 cm, centered in the right lung apex but extending peripherally into the overlying anterior and lateral right chest wall. Associated destructive/erosive changes of the right first through fourth ribs. 2. Diffuse ground-glass opacities along the medial and inferior margins of the right upper lobe mass could be postobstructive edema or lymphangitic carcinomatosis. 3. Right pleural effusion, moderate-sized. 4. Conglomerate lymphadenopathy in the mediastinum and right hilum, almost certainly metastatic disease. Additional lymphadenopathy in the right axilla. 5. Ill-defined fluid/edema within the subcutaneous soft tissues of the right lateral chest wall, presumably related to associated ectatic and/or vascular obstruction. Electronically Signed   By: SFranki CabotM.D.   On: 10/31/2015 17:31    Procedures Procedures (including critical care time)  Medications Ordered in ED Medications  calcitonin (MIACALCIN) injection 300 Units (not administered)  pamidronate (AREDIA) 90 mg in sodium chloride 0.9 % 500 mL IVPB (not administered)  sodium chloride 0.9 % bolus 1,000 mL (not administered)  iopamidol (ISOVUE-300) 61 % injection 80 mL (80 mLs Intravenous Contrast Given 10/31/15 1638)  sodium chloride 0.9 % bolus 1,000 mL (0 mLs Intravenous Stopped 10/31/15 1816)     Initial Impression / Assessment  and Plan / ED Course  I have reviewed the triage vital signs and the nursing notes.  Pertinent labs & imaging results that were available during my care of the patient were reviewed by me and considered in my medical decision making (see chart for details).  Clinical Course   7:49 PM The pt stated I could talk to his niece about his healthcare and his test results. He wanted me to tell her he was being admitted. I spoke with the niece and informed her that her uncle was being admitted and that his care was being transferred to the hospitalist team.   Pt with hypercalcemia and  WBC of 40K. Pt given bulos of fluids. Chest CT ordered to evaluate mass in right upper lung lobe. CT scan revealed mass consistent with pancoast tumor and possible Mediastinal lymph node involvement possible metastatic spread.   I consulted hospitalist team to have the pt admitted for treatment of hypercalcemia. Pt has not seen an oncologist nor has a biopsy of the mass been done to my knowledge. Dr. Roel Cluck will admit the pt for further evaluation and treatment. Thank you Dr. Roel Cluck for your consult and care of this patient.   Final Clinical Impressions(s) / ED Diagnoses   Final diagnoses:  Hypercalcemia  Leukocytosis  Lung mass    New Prescriptions New Prescriptions   No medications on file     Kalman Drape, [place] 10/31/15 2131    Courteney Lyn Mackuen, MD 11/01/15 2712    Courteney Julio Alm, MD 01/30/16 1116

## 2015-10-31 NOTE — ED Notes (Signed)
Pt unable to provide urine sample at this time 

## 2015-10-31 NOTE — H&P (Addendum)
Keith Murray GLO:[id number] DOB: 1960/07/22 DOA: 10/31/2015     PCP: Grant Fontana, MD (Inactive)  Have not been compliant with follow up Outpatient Specialists: none Patient coming from:  home Lives  With family    Chief Complaint: Elevated calcium  HPI: Keith Murray is a 55 y.o. male with medical history significant of cocaine and alcohol abuse schizoaffective disorder and bipolar disorder     Presented with  Patient was found to have elevated white blood cell count and calcium in May he had chest x-ray done that showed possible right upper lung mass today he presents to obtain CT scan. He endorses having right chest pain has been going on for months feels sharp and not associated with cough and fever nausea vomiting or abdominal pain. She leaves to his niece Keith Murray. Per family he was diagnosed with lung mass in May but never followed up he's been losing weight.  Review of records in May his calcium was already elevated at 11.5 white blood cell count was up to 22.5 also in May in January white blood cell, was elevated 12.0 predominantly neutrophils    IN ER: Temperature 98.2 respirations 16 blood pressure initially 90 07/29/1972 now up to 126/90  Labs are significant for sodium 131 potassium 3.4 bicarbonate 21 calcium elevated 13.2 albumin 2.9 alkaline phosphatase elevated 201. WBC 40.7 hemoglobin 10.4  CT scan showed massive solid appearing mass in the right eighth lung apex measuring 17 x 12 x 13 cm extending into the lateral and anterior right chest wall with erosive changes to right first through fourth ribs Hospitalist was called for admission for hypercalcemia and large lung mass  Review of Systems:    Pertinent positives include:  fatigue, weight loss chest pain,  shortness of breath at rest  Constitutional:  No weight loss, night sweats, Fevers, chills,  HEENT:  No headaches, Difficulty swallowing,Tooth/dental problems,Sore throat,  No sneezing, itching, ear  ache, nasal congestion, post nasal drip,  Cardio-vascular:  No Orthopnea, PND, anasarca, dizziness, palpitations.no Bilateral lower extremity swelling  GI:  No heartburn, indigestion, abdominal pain, nausea, vomiting, diarrhea, change in bowel habits, loss of appetite, melena, blood in stool, hematemesis Resp:  no. No dyspnea on exertion, No excess mucus, no productive cough, No non-productive cough, No coughing up of blood.No change in color of mucus.No wheezing. Skin:  no rash or lesions. No jaundice GU:  no dysuria, change in color of urine, no urgency or frequency. No straining to urinate.  No flank pain.  Musculoskeletal:  No joint pain or no joint swelling. No decreased range of motion. No back pain.  Psych:  No change in mood or affect. No depression or anxiety. No memory loss.  Neuro: no localizing neurological complaints, no tingling, no weakness, no double vision, no gait abnormality, no slurred speech, no confusion  As per HPI otherwise 10 point review of systems negative.   Past Medical History: Past Medical History:  Diagnosis Date  . Anxiety   . Polysubstance abuse    cocaine, etoh, marijuana  . Psychosis   . Schizoaffective disorder [facility])    Past Surgical History:  Procedure Laterality Date  . HERNIA REPAIR       Social History:  Ambulatory  independently      reports that he has been smoking.  He has a 2.00 pack-year smoking history. He does not have any smokeless tobacco history on file. He reports that he drinks alcohol. He reports that he uses drugs, including Cocaine and  Marijuana.  Allergies:  No Known Allergies     Family History:   Family History  Problem Relation Age of Onset  . Cancer Neg Hx   . Schizophrenia Neg Hx     Medications: Prior to Admission medications   Medication Sig Start Date End Date Taking? Authorizing Provider  benztropine (COGENTIN) 0.5 MG tablet Take 1 tablet (0.5 mg total) by mouth 2 (two) times daily. For  prevention of drug induced tremors 04/15/15   Encarnacion Slates, NP  FLUoxetine (PROZAC) 20 MG capsule Take 1 capsule (20 mg total) by mouth daily. For depression Patient not taking: Reported on 08/08/2015 04/15/15   Encarnacion Slates, NP  haloperidol (HALDOL) 5 MG tablet Take 1 tablet (5 mg total) by mouth 2 (two) times daily. For mood control 04/15/15   Encarnacion Slates, NP  hydrOXYzine (ATARAX/VISTARIL) 25 MG tablet Take 1 tablet (25 mg) every 6 hours as needed: Foe anxiety/sleep 04/15/15   Encarnacion Slates, NP  lisinopril (PRINIVIL,ZESTRIL) 10 MG tablet Take 1 tablet (10 mg total) by mouth daily. For high blood pressure Patient not taking: Reported on 08/08/2015 04/15/15   Encarnacion Slates, NP  Multiple Vitamin (MULTIVITAMIN WITH MINERALS) TABS tablet Take 1 tablet by mouth daily. May purchase over the counter: As a Vitamin supplement 04/15/15   Encarnacion Slates, NP  nicotine (NICODERM CQ - DOSED IN MG/24 HOURS) 21 mg/24hr patch Place 1 patch (21 mg total) onto the skin daily. For smoking cessation Patient not taking: Reported on 08/08/2015 04/15/15   Encarnacion Slates, NP  traZODone (DESYREL) 100 MG tablet Take 1 tablet (100 mg total) by mouth at bedtime. For insomnia 04/15/15   Encarnacion Slates, NP    Physical Exam: Patient Vitals for the past 24 hrs:  BP Temp Temp src Pulse Resp SpO2 Height Weight  10/31/15 1714 126/90 - - 95 19 97 % - -  10/31/15 1516 111/82 - - 97 20 98 % - -  10/31/15 1240 95/74 98.2 F (36.8 C) Oral 95 16 100 % '6\' 2"'$  (1.88 m) 83 kg (183 lb)    1. General:  in No Acute distress 2. Psychological: Alert and   Oriented 3. Head/ENT:    Dry Mucous Membranes                          Head Non traumatic, neck supple                           Poor Dentition 4. SKIN:  decreased Skin turgor,  Skin clean Dry and intact no rash 5. Heart: Regular rate and rhythm no  Murmur, Rub or gallop 6. Lungs: no wheezes some  crackles  diminished on the right 7. Abdomen: Soft, non-tender, Non distended  firm mass noted  over right lower quadrant 8. Lower extremities: no clubbing, cyanosis, or edema 9. Neurologically Grossly intact, moving all 4 extremities equally 10. MSK: Normal range of motion chest wall protrusion on the right noted venous distention throughout   body mass index is 23.5 kg/m.  Labs on Admission:   Labs on Admission: I have personally reviewed following labs and imaging studies  CBC:  Recent Labs Lab 10/31/15 1249  WBC 40.7*  HGB 10.4*  HCT 31.7*  MCV 86.6  PLT 673   Basic Metabolic Panel:  Recent Labs Lab 10/31/15 1249  NA 131*  K 3.4*  CL 103  CO2 21*  GLUCOSE 127*  BUN 14  CREATININE 1.17  CALCIUM 13.2*   GFR: Estimated Creatinine Clearance: 83.9 mL/min (by C-G formula based on SCr of 1.17 mg/dL). Liver Function Tests:  Recent Labs Lab 10/31/15 1249  AST 13*  ALT 10*  ALKPHOS 201*  BILITOT 0.4  PROT 7.3  ALBUMIN 2.9*    Recent Labs Lab 10/31/15 1249  LIPASE 16   No results for input(s): AMMONIA in the last 168 hours. Coagulation Profile: No results for input(s): INR, PROTIME in the last 168 hours. Cardiac Enzymes: No results for input(s): CKTOTAL, CKMB, CKMBINDEX, TROPONINI in the last 168 hours. BNP (last 3 results) No results for input(s): PROBNP in the last 8760 hours. HbA1C: No results for input(s): HGBA1C in the last 72 hours. CBG: No results for input(s): GLUCAP in the last 168 hours. Lipid Profile: No results for input(s): CHOL, HDL, LDLCALC, TRIG, CHOLHDL, LDLDIRECT in the last 72 hours. Thyroid Function Tests: No results for input(s): TSH, T4TOTAL, FREET4, T3FREE, THYROIDAB in the last 72 hours. Anemia Panel: No results for input(s): VITAMINB12, FOLATE, FERRITIN, TIBC, IRON, RETICCTPCT in the last 72 hours. Urine analysis:    Component Value Date/Time   COLORURINE YELLOW 10/31/2015 [place] 10/31/2015 1243   LABSPEC 1.013 10/31/2015 1243   PHURINE 6.0 10/31/2015 1243   GLUCOSEU NEGATIVE 10/31/2015 1243     HGBUR NEGATIVE 10/31/2015 1243   BILIRUBINUR NEGATIVE 10/31/2015 1243   KETONESUR NEGATIVE 10/31/2015 1243   PROTEINUR NEGATIVE 10/31/2015 1243   UROBILINOGEN 0.2 04/22/2010 1719   NITRITE NEGATIVE 10/31/2015 1243   LEUKOCYTESUR NEGATIVE 10/31/2015 1243   Sepsis Labs: '@LABRCNTIP'$ (procalcitonin:4,lacticidven:4) )No results found for this or any previous visit (from the past 240 hour(s)).     UA   no evidence of UTI  No results found for: HGBA1C  Estimated Creatinine Clearance: 83.9 mL/min (by C-G formula based on SCr of 1.17 mg/dL).  BNP (last 3 results) No results for input(s): PROBNP in the last 8760 hours.   ECG REPORT ordered  Filed Weights   10/31/15 1240  Weight: 83 kg (183 lb)     Cultures:    Component Value Date/Time   SDES ABSCESS ABSCESS LT SCROTAL 07/15/2010 0713   SPECREQUEST clindamycin and vancomycin IMMUNE:NORM 07/15/2010 0713   CULT  07/15/2010 0713    MULTIPLE ORGANISMS PRESENT, NONE PREDOMINANT Note: NO STAPHYLOCOCCUS AUREUS ISOLATED NO GROUP A STREP (S.PYOGENES) ISOLATED   REPTSTATUS 07/18/2010 FINAL 07/15/2010 0713     Radiological Exams on Admission: Dg Chest 2 View  Result Date: 10/31/2015 CLINICAL DATA:  Right chest pain. EXAM: CHEST  2 VIEW COMPARISON:  Jul 27, 2015 FINDINGS: The mass in the right upper chest is significantly larger in the interval measuring up to 15 cm in cranial caudal dimension today versus 10.6 cm previously. There is associated erosion of multiple ribs which was not seen previously in the adjacent ribs. The heart, hila, mediastinum, left lung, and lower right lung are otherwise unchanged. IMPRESSION: Enlarging right upper lobe pulmonary mass which may be pleural based. There is erosion of multiple right-sided ribs which was not seen previously. A chest CT could better evaluate. Electronically Signed   By: Dorise Bullion III M.D   On: 10/31/2015 13:34   Ct Chest W Contrast  Result Date: 10/31/2015 CLINICAL DATA:  Evaluate  large right upper lobe mass. EXAM: CT CHEST WITH CONTRAST TECHNIQUE: Multidetector CT imaging of the chest was performed during intravenous contrast administration. CONTRAST:  35m ISOVUE-300 IOPAMIDOL (  ISOVUE-300) INJECTION 61% COMPARISON:  Plain film of the chest from earlier today. FINDINGS: Cardiovascular: Thoracic aorta is normal in caliber and configuration. Heart size is normal. No pericardial effusion. Coronary artery calcifications noted. Mediastinum/Nodes: Conglomerate lymphadenopathy in the right hilum, measuring 3.8 x 2.4 cm. Additional conglomerate lymphadenopathy in the right lower peritracheal space, measuring up to 1.4 cm short axis dimension. Scattered smaller lymph nodes within the upper peritracheal and aortopulmonary window regions. Numerous enlarged and morphologically abnormal lymph nodes are seen within the right axilla. Lungs/Pleura: Massive solid-appearing mass in the right lung apex, measuring approximately 17 x 12 x 13 cm, centered in the right lung apex but extending into the overlying anterior and lateral right chest wall. There are destructive/erosive changes of the right first through fourth ribs. Diffuse ground-glass opacities along the medial and inferior margins of the right upper lobe mass could be edema or lymphangitic carcinomatosis. There is a moderate-sized right pleural effusion. Left lung is clear. Upper Abdomen: Limited images of the upper abdomen are unremarkable. Musculoskeletal: Destructive changes of the right first through fourth ribs by the right upper lobe mass. No distant osseous metastases seen. As above, the right upper lobe mass extends outward into the anterior and lateral right chest wall. Ill-defined fluid/edema is seen within the lower portions of the right chest wall, likely related to associated lymphatic and/or vascular obstruction. IMPRESSION: 1. Massive solid-appearing mass in the right lung apex, consistent with Pancoast tumor, measuring approximately 17  x 12 x 13 cm, centered in the right lung apex but extending peripherally into the overlying anterior and lateral right chest wall. Associated destructive/erosive changes of the right first through fourth ribs. 2. Diffuse ground-glass opacities along the medial and inferior margins of the right upper lobe mass could be postobstructive edema or lymphangitic carcinomatosis. 3. Right pleural effusion, moderate-sized. 4. Conglomerate lymphadenopathy in the mediastinum and right hilum, almost certainly metastatic disease. Additional lymphadenopathy in the right axilla. 5. Ill-defined fluid/edema within the subcutaneous soft tissues of the right lateral chest wall, presumably related to associated ectatic and/or vascular obstruction. Electronically Signed   By: Franki Cabot M.D.   On: 10/31/2015 17:31    Chart has been reviewed    Assessment/Plan   55 y.o. male with medical history significant of cocaine and alcohol abuse schizoaffective disorder and bipolar disorder being admitted for large right lung mass   Present on Admission: . Polysubstance (excluding opioids) dependence ([place]) patient states he is currently denying drug use or alcohol use will check alcohol level watch for any signs of alcohol withdrawal . Schizoaffective disorder, bipolar type ([place]) continue home medications appreciated behavioral health evaluation . Hypercalcemia of malignancy - rehydrate and follow calcium level administer calcitonin and pamidronate once rehydrated may need Lasix to continue treatment . Lung mass  - IR consult for biopsy at this point patient is agreeable to proceedure, phone numbers for family on Chart. He'll have behavioral health evaluate for capacity to make decisions . Postobstructive pneumonia - given elevated white blood cell count and questionable chills will initiate Zosyn for postobstructive pneumonia  Abdominal mass - will need CT imaging within 24 hours to evaluate further will likely need other  staging including brain MRI when able to tolerate Leukocytosis - patient reports occasional chills but no fevers most likely secondary to heavy mass burden. There is a possibility of postobstructive pneumonia we'll treat with Zosyn  Other plan as per orders.  DVT prophylaxis:  SCD    Code Status:  FULL CODE  Family  Communication:   Family not  at  Bedside Attempted to discussed with niece  [phone] Keith Murray patient also provided phone number of his other niece on the phone [phone] no answer on both numbers  Disposition Plan:   To home once workup is complete and patient is stable   Consults called:BEHAVIORAL HEALTH, pharmacy, order for IR cosult  Admission status:   inpatient       Level of care    tele        I have spent a total of 76 min on this admission   extra time was spent to discuss case with pharmacy and [place] 10/31/2015, 8:11 PM    [facility]  Pager [phone]   after 2 AM please page floor coverage PA If 7AM-7PM, please contact the day team taking care of the patient  [email or web address]  Password [email or web address]

## 2015-10-31 NOTE — ED Notes (Signed)
Critical calcium Calcium 13.2.

## 2015-10-31 NOTE — ED Notes (Signed)
Patient transported to CT 

## 2015-11-01 ENCOUNTER — Encounter (HOSPITAL_COMMUNITY): Payer: Self-pay | Admitting: Radiology

## 2015-11-01 ENCOUNTER — Inpatient Hospital Stay (HOSPITAL_COMMUNITY): Payer: Medicare Other

## 2015-11-01 DIAGNOSIS — F250 Schizoaffective disorder, bipolar type: Secondary | ICD-10-CM

## 2015-11-01 LAB — RAPID URINE DRUG SCREEN, HOSP PERFORMED
Amphetamines: NOT DETECTED
Barbiturates: NOT DETECTED
Benzodiazepines: NOT DETECTED
Cocaine: NOT DETECTED
Opiates: NOT DETECTED
Tetrahydrocannabinol: NOT DETECTED

## 2015-11-01 LAB — COMPREHENSIVE METABOLIC PANEL
ALT: 7 U/L — ABNORMAL LOW (ref 17–63)
AST: 11 U/L — ABNORMAL LOW (ref 15–41)
Albumin: 2.6 g/dL — ABNORMAL LOW (ref 3.5–5.0)
Alkaline Phosphatase: 172 U/L — ABNORMAL HIGH (ref 38–126)
Anion gap: 4 — ABNORMAL LOW (ref 5–15)
BUN: 13 mg/dL (ref 6–20)
CO2: 21 mmol/L — ABNORMAL LOW (ref 22–32)
Calcium: 11 mg/dL — ABNORMAL HIGH (ref 8.9–10.3)
Chloride: 110 mmol/L (ref 101–111)
Creatinine, Ser: 1.06 mg/dL (ref 0.61–1.24)
GFR calc Af Amer: >60 mL/min (ref >60)
GFR calc non Af Amer: >60 mL/min (ref >60)
Glucose, Bld: 95 mg/dL (ref 65–99)
Potassium: 3.7 mmol/L (ref 3.5–5.1)
Sodium: 135 mmol/L (ref 135–145)
Total Bilirubin: 0.6 mg/dL (ref 0.3–1.2)
Total Protein: 6.1 g/dL — ABNORMAL LOW (ref 6.5–8.1)

## 2015-11-01 LAB — CBC
HCT: 27.1 % — ABNORMAL LOW (ref 39.0–52.0)
Hemoglobin: 8.8 g/dL — ABNORMAL LOW (ref 13.0–17.0)
MCH: 27.9 pg (ref 26.0–34.0)
MCHC: 32.5 g/dL (ref 30.0–36.0)
MCV: 86 fL (ref 78.0–100.0)
Platelets: 405 10*3/uL — ABNORMAL HIGH (ref 150–400)
RBC: 3.15 MIL/uL — ABNORMAL LOW (ref 4.22–5.81)
RDW: 14.9 % (ref 11.5–15.5)
WBC: 40 10*3/uL — ABNORMAL HIGH (ref 4.0–10.5)

## 2015-11-01 LAB — MAGNESIUM: Magnesium: 1.8 mg/dL (ref 1.7–2.4)

## 2015-11-01 LAB — TSH: TSH: 1.393 u[IU]/mL (ref 0.350–4.500)

## 2015-11-01 LAB — PHOSPHORUS: Phosphorus: 1.6 mg/dL — ABNORMAL LOW (ref 2.5–4.6)

## 2015-11-01 LAB — STREP PNEUMONIAE URINARY ANTIGEN: Strep Pneumo Urinary Antigen: NEGATIVE

## 2015-11-01 LAB — CALCIUM: Calcium: 11.7 mg/dL — ABNORMAL HIGH (ref 8.9–10.3)

## 2015-11-01 LAB — HIV ANTIBODY (ROUTINE TESTING W REFLEX): HIV Screen 4th Generation wRfx: NONREACTIVE

## 2015-11-01 LAB — PROTIME-INR
INR: 1.13
Prothrombin Time: 14.5 seconds (ref 11.4–15.2)

## 2015-11-01 MED ORDER — POTASSIUM PHOSPHATES 15 MMOLE/5ML IV SOLN
20.0000 meq | Freq: Once | INTRAVENOUS | Status: AC
  Administered 2015-11-01: 20 meq via INTRAVENOUS
  Filled 2015-11-01: qty 4.55

## 2015-11-01 MED ORDER — DIATRIZOATE MEGLUMINE & SODIUM 66-10 % PO SOLN
15.0000 mL | Freq: Once | ORAL | Status: AC
  Administered 2015-11-01: 30 mL via ORAL
  Filled 2015-11-01: qty 30

## 2015-11-01 MED ORDER — LORAZEPAM 2 MG/ML IJ SOLN
2.0000 mg | INTRAMUSCULAR | Status: DC | PRN
  Administered 2015-11-01 – 2015-11-05 (×2): 2 mg via INTRAVENOUS
  Filled 2015-11-01 (×2): qty 1

## 2015-11-01 MED ORDER — HALOPERIDOL 5 MG PO TABS: 5.0000 mg | ORAL_TABLET | Freq: Every day | ORAL | Status: DC

## 2015-11-01 MED ORDER — GADOBENATE DIMEGLUMINE 529 MG/ML IV SOLN
17.0000 mL | Freq: Once | INTRAVENOUS | Status: AC | PRN
  Administered 2015-11-01: 18 mL via INTRAVENOUS

## 2015-11-01 MED ORDER — HALOPERIDOL 5 MG PO TABS
5.0000 mg | ORAL_TABLET | Freq: Two times a day (BID) | ORAL | Status: DC
  Administered 2015-11-01 – 2015-11-07 (×13): 5 mg via ORAL
  Filled 2015-11-01 (×14): qty 1

## 2015-11-01 MED ORDER — LORAZEPAM 2 MG/ML IJ SOLN
0.5000 mg | INTRAMUSCULAR | Status: DC | PRN
  Filled 2015-11-01: qty 1

## 2015-11-01 MED ORDER — IOPAMIDOL (ISOVUE-300) INJECTION 61%
100.0000 mL | Freq: Once | INTRAVENOUS | Status: AC | PRN
  Administered 2015-11-01: 100 mL via INTRAVENOUS

## 2015-11-01 MED ORDER — LORAZEPAM 2 MG/ML IJ SOLN: 0.5000 mg | INTRAMUSCULAR | Status: DC | PRN

## 2015-11-01 MED ORDER — SODIUM CHLORIDE 0.9 % IV SOLN
INTRAVENOUS | Status: DC
  Administered 2015-11-01 – 2015-11-03 (×5): via INTRAVENOUS

## 2015-11-01 NOTE — Plan of Care (Signed)
Problem: Safety: Goal: Ability to remain free from injury will improve Outcome: Not Progressing Pt refuses to sign safety contract with RN

## 2015-11-01 NOTE — Progress Notes (Addendum)
PROGRESS NOTE    Keith Murray  YTK:[id number]  DOB: 09/13/1960  DOA: 10/31/2015 PCP: Grant Fontana, MD (Inactive) Outpatient Specialists:  Hospital course: Keith Murray is a 55 y.o. male with medical history significant of cocaine and alcohol abuse schizoaffective disorder and bipolar disorder.  Patient was found to have elevated white blood cell count and calcium in May he had chest x-ray done that showed possible right upper lung mass today he presents to obtain CT scan.  He endorses having right chest pain has been going on for months feels sharp and not associated with cough and fever nausea vomiting or abdominal pain. She leaves to his niece Keith Murray. Per family he was diagnosed with lung mass in May but never followed up he's been losing weight.  Review of records in May his calcium was already elevated at 11.5 white blood cell count was up to 22.5 also in May in January white blood cell, was elevated 12.0 predominantly neutrophils  Assessment & Plan:   1. Hypercalcemia of malignancy (suspected) - improving with aggressive therapy with calcitonin and pamidronate, IV normal saline.  Continue telemetry.  Repeat in AM.   2. Lung Mass - right, Pt consented for tissue biopsy by IR.  Likely for 8/7.  3. Postobstructive Pneumonia - Continue IV Zosyn.   4. Abdominal Mass - CT abdomen ordered for today to further evaluate.    5. Leukocytosis - Trending down slightly.   6. Schizoaffective Disorder - behavioral health consult requested.   7. History of polysubstance abuse - urine toxicology screen negative.    DVT prophylaxis: SCDs Code Status: FULL Family Communication: Met with niece  [phone] Keith Murray patient also provided phone number of his other niece on the phone [phone] Disposition Plan: TBD  Consultants: [place], pharmacy, order for IR cosult  Procedures:  pending  Antimicrobials: Anti-infectives    Start     Dose/Rate Route Frequency Ordered  Stop   10/31/15 2100  piperacillin-tazobactam (ZOSYN) IVPB 3.375 g     3.375 g 12.5 mL/hr over 240 Minutes Intravenous Every 8 hours 10/31/15 2051         Subjective: Pt without complaints, willing to proceed with biopsy tomorrow.   Objective: Vitals:   10/31/15 2023 10/31/15 2053 11/01/15 0129 11/01/15 0610  BP: 117/78 123/81 108/64 (!) 106/52  Pulse: 94 (!) 109 (!) 101 87  Resp: _0 Temp:  98.5 F (36.9 C) 99 F (37.2 C) 98.3 F (36.8 C)  TempSrc:  Oral Oral Oral  SpO2: 96% 97% 100% 100%  Weight:  83 kg (182 lb 15.7 oz)    Height:  _1  (1.88 m)      Intake/Output Summary (Last 24 hours) at 11/01/15 3151 Last data filed at 11/01/15 0700  Gross per 24 hour  Intake           3167.5 ml  Output              400 ml  Net           2767.5 ml   Filed Weights   10/31/15 1240 10/31/15 2053  Weight: 83 kg (183 lb) 83 kg (182 lb 15.7 oz)   Exam:  General exam: emaciated chronically ill appearing male, no apparent distress Respiratory system: diminished BS RUL RML Cardiovascular system: S1 & S2 [place], RRR. No JVD, murmurs, gallops, clicks or pedal edema. Gastrointestinal system: Abdomen is nondistended, soft and nontender. Firm Mass over RLQ appears to be subcutaneous,  Normal bowel sounds [place]. Central nervous system: Alert and oriented. No focal neurological deficits. Extremities: severe clubbing noted in fingers bilateral hands.  Data Reviewed: Basic Metabolic Panel:  Recent Labs Lab 10/31/15 1249 10/31/15 2350 11/01/15 0531  NA 131*  --  135  K 3.4*  --  3.7  CL 103  --  110  CO2 21*  --  21*  GLUCOSE 127*  --  95  BUN 14  --  13  CREATININE 1.17  --  1.06  CALCIUM 13.2* 11.7* 11.0*  MG  --   --  1.8  PHOS  --   --  1.6*   Liver Function Tests:  Recent Labs Lab 10/31/15 1249 11/01/15 0531  AST 13* 11*  ALT 10* 7*  ALKPHOS 201* 172*  BILITOT 0.4 0.6  PROT 7.3 6.1*  ALBUMIN 2.9* 2.6*    Recent Labs Lab 10/31/15 1249  LIPASE 16   No  results for input(s): AMMONIA in the last 168 hours. CBC:  Recent Labs Lab 10/31/15 1249 10/31/15 1906 11/01/15 0531  WBC 40.7* 45.5* 40.0*  NEUTROABS  --  41.4*  --   HGB 10.4* 9.9* 8.8*  HCT 31.7* 29.6* 27.1*  MCV 86.6 85.5 86.0  PLT 391 438* 405*   Cardiac Enzymes: No results for input(s): CKTOTAL, CKMB, CKMBINDEX, TROPONINI in the last 168 hours. CBG (last 3)  No results for input(s): GLUCAP in the last 72 hours. No results found for this or any previous visit (from the past 240 hour(s)).   Studies: Dg Chest 2 View  Result Date: 10/31/2015 CLINICAL DATA:  Right chest pain. EXAM: CHEST  2 VIEW COMPARISON:  Jul 27, 2015 FINDINGS: The mass in the right upper chest is significantly larger in the interval measuring up to 15 cm in cranial caudal dimension today versus 10.6 cm previously. There is associated erosion of multiple ribs which was not seen previously in the adjacent ribs. The heart, hila, mediastinum, left lung, and lower right lung are otherwise unchanged. IMPRESSION: Enlarging right upper lobe pulmonary mass which may be pleural based. There is erosion of multiple right-sided ribs which was not seen previously. A chest CT could better evaluate. Electronically Signed   By: Dorise Bullion III M.D   On: 10/31/2015 13:34   Ct Chest W Contrast  Result Date: 10/31/2015 CLINICAL DATA:  Evaluate large right upper lobe mass. EXAM: CT CHEST WITH CONTRAST TECHNIQUE: Multidetector CT imaging of the chest was performed during intravenous contrast administration. CONTRAST:  85m ISOVUE-300 IOPAMIDOL (ISOVUE-300) INJECTION 61% COMPARISON:  Plain film of the chest from earlier today. FINDINGS: Cardiovascular: Thoracic aorta is normal in caliber and configuration. Heart size is normal. No pericardial effusion. Coronary artery calcifications noted. Mediastinum/Nodes: Conglomerate lymphadenopathy in the right hilum, measuring 3.8 x 2.4 cm. Additional conglomerate lymphadenopathy in the right lower  peritracheal space, measuring up to 1.4 cm short axis dimension. Scattered smaller lymph nodes within the upper peritracheal and aortopulmonary window regions. Numerous enlarged and morphologically abnormal lymph nodes are seen within the right axilla. Lungs/Pleura: Massive solid-appearing mass in the right lung apex, measuring approximately 17 x 12 x 13 cm, centered in the right lung apex but extending into the overlying anterior and lateral right chest wall. There are destructive/erosive changes of the right first through fourth ribs. Diffuse ground-glass opacities along the medial and inferior margins of the right upper lobe mass could be edema or lymphangitic carcinomatosis. There is a moderate-sized right pleural effusion. Left lung is clear. Upper Abdomen:  Limited images of the upper abdomen are unremarkable. Musculoskeletal: Destructive changes of the right first through fourth ribs by the right upper lobe mass. No distant osseous metastases seen. As above, the right upper lobe mass extends outward into the anterior and lateral right chest wall. Ill-defined fluid/edema is seen within the lower portions of the right chest wall, likely related to associated lymphatic and/or vascular obstruction. IMPRESSION: 1. Massive solid-appearing mass in the right lung apex, consistent with Pancoast tumor, measuring approximately 17 x 12 x 13 cm, centered in the right lung apex but extending peripherally into the overlying anterior and lateral right chest wall. Associated destructive/erosive changes of the right first through fourth ribs. 2. Diffuse ground-glass opacities along the medial and inferior margins of the right upper lobe mass could be postobstructive edema or lymphangitic carcinomatosis. 3. Right pleural effusion, moderate-sized. 4. Conglomerate lymphadenopathy in the mediastinum and right hilum, almost certainly metastatic disease. Additional lymphadenopathy in the right axilla. 5. Ill-defined fluid/edema  within the subcutaneous soft tissues of the right lateral chest wall, presumably related to associated ectatic and/or vascular obstruction. Electronically Signed   By: Franki Cabot M.D.   On: 10/31/2015 17:31     Scheduled Meds: . benztropine  0.5 mg Oral BID  . calcitonin  300 Units Subcutaneous Q12H  . FLUoxetine  20 mg Oral Daily  . haloperidol  5 mg Oral BID  . nicotine  21 mg Transdermal Daily  . piperacillin-tazobactam (ZOSYN)  IV  3.375 g Intravenous Q8H  . sodium chloride flush  3 mL Intravenous Q12H  . traZODone  100 mg Oral QHS   Continuous Infusions: . sodium chloride      Active Problems:   Polysubstance (excluding opioids) dependence ([facility])   Schizoaffective disorder, bipolar type ([facility])   Hypercalcemia of malignancy   Lung mass   Postobstructive pneumonia   Hypercalcemia   Leukocytosis   Time spent:   Irwin Brakeman, MD, FAAFP [facility] Pager [phone]  If 7PM-7AM, please contact night-coverage [email or web address] Password [email or web address] 11/01/2015, 9:27 AM    LOS: 1 day

## 2015-11-01 NOTE — Consult Note (Signed)
[facility] Consult   Reason for Consult:  Capacity determination Referring Physician:  Dr. Darnell Level Patient Identification: Keith Murray MRN:  [id number] Principal Diagnosis: Schizoaffective disorder, bipolar type [facility]) Diagnosis:   Patient Active Problem List   Diagnosis Date Noted  . Schizoaffective disorder, bipolar type ([place]) [F25.0]     Priority: High  . Alcohol dependence ([place]) [F10.20] 06/08/2013    Priority: High  . Polysubstance (excluding opioids) dependence ([place]) [F19.20] 08/08/2012    Priority: High  . Hypercalcemia of malignancy [E83.52] 10/31/2015  . Lung mass [R91.8] 10/31/2015  . Postobstructive pneumonia [J18.9] 10/31/2015  . Hypercalcemia [E83.52] 10/31/2015  . Leukocytosis [D72.829] 10/31/2015  . Cocaine abuse [F14.10] 08/10/2015  . Cocaine-induced mood disorder ([place]) [F14.94] 08/10/2015  . Alcohol abuse with intoxication ([place]) [F10.129] 04/14/2015  . Cocaine abuse with cocaine-induced mood disorder ([place]) [F14.14] 04/14/2015  . Psychoses [F29]     Total Time spent with patient: 1 hour  Subjective:   Keith Murray is a 55 y.o. male patient admitted with chest pain.  HPI: Keith Heardis a 55 y.o.malewith long history of Cocaine use disorder-severe, Alcohol use disorder-severe and Schizoaffective disorder-bipolar type. Patient reports that he came to the hospital to be evaluated for chest pain. Per chart report, he was found to have elevated white blood cell count and calcium in May 2017, he had chest x-ray done that showed possible right upper lung mass. He endorses more that one week  right chest pain has been going on for months feels sharp and not associated with cough and fever nausea vomiting or abdominal pain. Today, patient states that he is has been told about his diagnosis and he is ready to accept treatment . He understands the consequences of not getting treatment and says: ''I am not ready to die.''. Patient denies psychosis, delusional  thinking, drugs or Alcohol abuse in the last one week.   Past Psychiatric History: as above  Risk to Self: Is patient at risk for suicide?: No Risk to Others:   Prior Inpatient Therapy:   Prior Outpatient Therapy:    Past Medical History:  Past Medical History:  Diagnosis Date  . Anxiety   . Polysubstance abuse    cocaine, etoh, marijuana  . Psychosis   . Schizoaffective disorder [facility])     Past Surgical History:  Procedure Laterality Date  . HERNIA REPAIR     Family History:  Family History  Problem Relation Age of Onset  . Cancer Neg Hx   . Schizophrenia Neg Hx    Family Psychiatric  History: Social History:  History  Alcohol Use No    Comment: reports not drinking any longer     History  Drug Use  . Types: Cocaine, Marijuana    Comment: THC about once per week and cocaine on occasion.     Social History   Social History  . Marital status: Single    Spouse name: N/A  . Number of children: N/A  . Years of education: N/A   Social History Main Topics  . Smoking status: Current Every Day Smoker    Packs/day: 0.50    Years: 4.00  . Smokeless tobacco: Never Used  . Alcohol use No     Comment: reports not drinking any longer  . Drug use:     Types: Cocaine, Marijuana     Comment: THC about once per week and cocaine on occasion.   . Sexual activity: Yes    Birth control/ protection: Condom   Other Topics  Concern  . None   Social History Narrative  . None   Additional Social History:    Allergies:  No Known Allergies  Labs:  Results for orders placed or performed during the hospital encounter of 10/31/15 (from the past 48 hour(s))  Urinalysis, Routine w reflex microscopic     Status: None   Collection Time: 10/31/15 12:43 PM  Result Value Ref Range   Color, Urine YELLOW YELLOW   APPearance CLEAR CLEAR   Specific Gravity, Urine 1.013 1.005 - 1.030   pH 6.0 5.0 - 8.0   Glucose, UA NEGATIVE NEGATIVE mg/dL   Hgb urine dipstick NEGATIVE NEGATIVE    Bilirubin Urine NEGATIVE NEGATIVE   Ketones, ur NEGATIVE NEGATIVE mg/dL   Protein, ur NEGATIVE NEGATIVE mg/dL   Nitrite NEGATIVE NEGATIVE   Leukocytes, UA NEGATIVE NEGATIVE    Comment: MICROSCOPIC NOT DONE ON URINES WITH NEGATIVE PROTEIN, BLOOD, LEUKOCYTES, NITRITE, OR GLUCOSE <1000 mg/dL.  Lipase, blood     Status: None   Collection Time: 10/31/15 12:49 PM  Result Value Ref Range   Lipase 16 11 - 51 U/L  Comprehensive metabolic panel     Status: Abnormal   Collection Time: 10/31/15 12:49 PM  Result Value Ref Range   Sodium 131 (L) 135 - 145 mmol/L   Potassium 3.4 (L) 3.5 - 5.1 mmol/L   Chloride 103 101 - 111 mmol/L   CO2 21 (L) 22 - 32 mmol/L   Glucose, Bld 127 (H) 65 - 99 mg/dL   BUN 14 6 - 20 mg/dL   Creatinine, Ser 1.17 0.61 - 1.24 mg/dL   Calcium 13.2 (HH) 8.9 - 10.3 mg/dL    Comment: CRITICAL RESULT CALLED TO, READ BACK BY AND VERIFIED WITH: DURRY,K RN [id number] [place],N    Total Protein 7.3 6.5 - 8.1 g/dL   Albumin 2.9 (L) 3.5 - 5.0 g/dL   AST 13 (L) 15 - 41 U/L   ALT 10 (L) 17 - 63 U/L   Alkaline Phosphatase 201 (H) 38 - 126 U/L   Total Bilirubin 0.4 0.3 - 1.2 mg/dL   GFR calc non Af Amer >60 >60 mL/min   GFR calc Af Amer >60 >60 mL/min    Comment: (NOTE) The eGFR has been calculated using the CKD EPI equation. This calculation has not been validated in all clinical situations. eGFR's persistently <60 mL/min signify possible Chronic Kidney Disease.    Anion gap 7 5 - 15  CBC     Status: Abnormal   Collection Time: 10/31/15 12:49 PM  Result Value Ref Range   WBC 40.7 (H) 4.0 - 10.5 K/uL    Comment: RESULT REPEATED AND VERIFIED   RBC 3.66 (L) 4.22 - 5.81 MIL/uL   Hemoglobin 10.4 (L) 13.0 - 17.0 g/dL   HCT 31.7 (L) 39.0 - 52.0 %   MCV 86.6 78.0 - 100.0 fL   MCH 28.4 26.0 - 34.0 pg   MCHC 32.8 30.0 - 36.0 g/dL   RDW 14.9 11.5 - 15.5 %   Platelets 391 150 - 400 K/uL  CBC with Differential/Platelet     Status: Abnormal   Collection Time: 10/31/15  7:06 PM   Result Value Ref Range   WBC 45.5 (H) 4.0 - 10.5 K/uL    Comment: RESULT REPEATED AND VERIFIED   RBC 3.46 (L) 4.22 - 5.81 MIL/uL   Hemoglobin 9.9 (L) 13.0 - 17.0 g/dL   HCT 29.6 (L) 39.0 - 52.0 %   MCV 85.5 78.0 - 100.0 fL  MCH 28.6 26.0 - 34.0 pg   MCHC 33.4 30.0 - 36.0 g/dL   RDW 14.7 11.5 - 15.5 %   Platelets 438 (H) 150 - 400 K/uL   Neutrophils Relative % 91 %   Lymphocytes Relative 6 %   Monocytes Relative 3 %   Eosinophils Relative 0 %   Basophils Relative 0 %   Neutro Abs 41.4 (H) 1.7 - 7.7 K/uL   Lymphs Abs 2.7 0.7 - 4.0 K/uL   Monocytes Absolute 1.4 (H) 0.1 - 1.0 K/uL   Eosinophils Absolute 0.0 0.0 - 0.7 K/uL   Basophils Absolute 0.0 0.0 - 0.1 K/uL   Smear Review MORPHOLOGY UNREMARKABLE   Strep pneumoniae urinary antigen     Status: None   Collection Time: 10/31/15  8:52 PM  Result Value Ref Range   Strep Pneumo Urinary Antigen NEGATIVE NEGATIVE    Comment: PERFORMED AT [facility]        Infection due to S. pneumoniae cannot be absolutely ruled out since the antigen present may be below the detection limit of the test. Performed at [facility]   Urine rapid drug screen (hosp performed)     Status: None   Collection Time: 10/31/15  8:52 PM  Result Value Ref Range   Opiates NONE DETECTED NONE DETECTED   Cocaine NONE DETECTED NONE DETECTED   Benzodiazepines NONE DETECTED NONE DETECTED   Amphetamines NONE DETECTED NONE DETECTED   Tetrahydrocannabinol NONE DETECTED NONE DETECTED   Barbiturates NONE DETECTED NONE DETECTED    Comment:        DRUG SCREEN FOR MEDICAL PURPOSES ONLY.  IF CONFIRMATION IS NEEDED FOR ANY PURPOSE, NOTIFY LAB WITHIN 5 DAYS.        LOWEST DETECTABLE LIMITS FOR URINE DRUG SCREEN Drug Class       Cutoff (ng/mL) Amphetamine      1000 Barbiturate      200 Benzodiazepine   009 Tricyclics       233 Opiates          300 Cocaine          300 THC              50   Ethanol     Status: None   Collection Time: 10/31/15  10:05 PM  Result Value Ref Range   Alcohol, Ethyl (B) <5 <5 mg/dL    Comment:        LOWEST DETECTABLE LIMIT FOR SERUM ALCOHOL IS 5 mg/dL FOR MEDICAL PURPOSES ONLY   Calcium     Status: Abnormal   Collection Time: 10/31/15 11:50 PM  Result Value Ref Range   Calcium 11.7 (H) 8.9 - 10.3 mg/dL  Magnesium     Status: None   Collection Time: 11/01/15  5:31 AM  Result Value Ref Range   Magnesium 1.8 1.7 - 2.4 mg/dL  Phosphorus     Status: Abnormal   Collection Time: 11/01/15  5:31 AM  Result Value Ref Range   Phosphorus 1.6 (L) 2.5 - 4.6 mg/dL  TSH     Status: None   Collection Time: 11/01/15  5:31 AM  Result Value Ref Range   TSH 1.393 0.350 - 4.500 uIU/mL  Comprehensive metabolic panel     Status: Abnormal   Collection Time: 11/01/15  5:31 AM  Result Value Ref Range   Sodium 135 135 - 145 mmol/L   Potassium 3.7 3.5 - 5.1 mmol/L   Chloride 110 101 - 111 mmol/L   CO2  21 (L) 22 - 32 mmol/L   Glucose, Bld 95 65 - 99 mg/dL   BUN 13 6 - 20 mg/dL   Creatinine, Ser 1.06 0.61 - 1.24 mg/dL   Calcium 11.0 (H) 8.9 - 10.3 mg/dL   Total Protein 6.1 (L) 6.5 - 8.1 g/dL   Albumin 2.6 (L) 3.5 - 5.0 g/dL   AST 11 (L) 15 - 41 U/L   ALT 7 (L) 17 - 63 U/L   Alkaline Phosphatase 172 (H) 38 - 126 U/L   Total Bilirubin 0.6 0.3 - 1.2 mg/dL   GFR calc non Af Amer >60 >60 mL/min   GFR calc Af Amer >60 >60 mL/min    Comment: (NOTE) The eGFR has been calculated using the CKD EPI equation. This calculation has not been validated in all clinical situations. eGFR's persistently <60 mL/min signify possible Chronic Kidney Disease.    Anion gap 4 (L) 5 - 15  CBC     Status: Abnormal   Collection Time: 11/01/15  5:31 AM  Result Value Ref Range   WBC 40.0 (H) 4.0 - 10.5 K/uL   RBC 3.15 (L) 4.22 - 5.81 MIL/uL   Hemoglobin 8.8 (L) 13.0 - 17.0 g/dL   HCT 27.1 (L) 39.0 - 52.0 %   MCV 86.0 78.0 - 100.0 fL   MCH 27.9 26.0 - 34.0 pg   MCHC 32.5 30.0 - 36.0 g/dL   RDW 14.9 11.5 - 15.5 %   Platelets 405  (H) 150 - 400 K/uL  Protime-INR     Status: None   Collection Time: 11/01/15  5:31 AM  Result Value Ref Range   Prothrombin Time 14.5 11.4 - 15.2 seconds   INR 1.13     Current Facility-Administered Medications  Medication Dose Route Frequency Provider Last Rate Last Dose  . 0.9 %  sodium chloride infusion   Intravenous Continuous [place] Marisa Hua, MD 125 mL/hr at 11/01/15 1001    . acetaminophen (TYLENOL) tablet 650 mg  650 mg Oral Q6H PRN Toy Baker, MD       Or  . acetaminophen (TYLENOL) suppository 650 mg  650 mg Rectal Q6H PRN Toy Baker, MD      . benztropine (COGENTIN) tablet 0.5 mg  0.5 mg Oral BID Toy Baker, MD   0.5 mg at 11/01/15 1000  . calcitonin (MIACALCIN) injection 300 Units  300 Units Subcutaneous Q12H Toy Baker, MD   300 Units at 11/01/15 1000  . FLUoxetine (PROZAC) capsule 20 mg  20 mg Oral Daily Toy Baker, MD   20 mg at 11/01/15 1000  . haloperidol (HALDOL) tablet 5 mg  5 mg Oral BID Toy Baker, MD   5 mg at 10/31/15 2133  . HYDROcodone-acetaminophen (NORCO/VICODIN) 5-325 MG per tablet 1-2 tablet  1-2 tablet Oral Q4H PRN Toy Baker, MD      . hydrOXYzine (ATARAX/VISTARIL) tablet 25 mg  25 mg Oral TID PRN Toy Baker, MD      . nicotine (NICODERM CQ - dosed in mg/24 hours) patch 21 mg  21 mg Transdermal Daily Toy Baker, MD   21 mg at 11/01/15 0959  . ondansetron (ZOFRAN) tablet 4 mg  4 mg Oral Q6H PRN Toy Baker, MD       Or  . ondansetron (ZOFRAN) injection 4 mg  4 mg Intravenous Q6H PRN Toy Baker, MD      . piperacillin-tazobactam (ZOSYN) IVPB 3.375 g  3.375 g Intravenous Q8H Toy Baker, MD   3.375 g at 11/01/15 0608  .  potassium phosphate 20 mEq in dextrose 5 % 250 mL infusion  20 mEq Intravenous Once [place] Marisa Hua, MD   20 mEq at 11/01/15 1206  . sodium chloride flush (NS) 0.9 % injection 3 mL  3 mL Intravenous Q12H Toy Baker, MD      . traZODone  (DESYREL) tablet 100 mg  100 mg Oral QHS Toy Baker, MD   100 mg at 10/31/15 2133    Musculoskeletal: Strength & Muscle Tone: within normal limits Gait & Station: normal Patient leans: N/A  Psychiatric Specialty Exam: Physical Exam  Psychiatric: He has a normal mood and affect. His speech is normal and behavior is normal. Judgment and thought content normal. Cognition and memory are normal.    Review of Systems  HENT: Negative.   Respiratory: Negative.   Cardiovascular: Positive for chest pain.  Genitourinary: Negative.   Skin: Negative.     Blood pressure 112/64, pulse 94, temperature 98.3 F (36.8 C), temperature source Oral, resp. rate 18, height _0  (1.88 m), weight 83 kg (182 lb 15.7 oz), SpO2 100 %.Body mass index is 23.49 kg/m.  General Appearance: Casual  Eye Contact:  Good  Speech:  Clear and Coherent  Volume:  Normal  Mood:  Anxious  Affect:  Constricted  Thought Process:  Coherent  Orientation:  Full (Time, Place, and Person)  Thought Content:  Logical  Suicidal Thoughts:  No  Homicidal Thoughts:  No  Memory:  Immediate;   Fair Recent;   Fair Remote;   Good  Judgement:  Fair  Insight:  Shallow  Psychomotor Activity:  Psychomotor Retardation  Concentration:  Concentration: Fair and Attention Span: Fair  Recall:  [employer] of Knowledge:  Fair  Language:  Good  Akathisia:  No  Handed:  Right  AIMS (if indicated):     Assets:  Communication Skills Social Support  ADL's:  Intact  Cognition:  WNL  Sleep:   fair     Treatment Plan Summary: Plan/Recommendation -Patient has capacity to make medical decision at this time. -Continue Haldol 48m bid for psychosis, Cogentin 0.534mbid EPS prevention -Continue Prozac 2038maily for depression -Patient cleared from psychiatric service at this time.  Disposition: No evidence of imminent risk to self or others at present.   Patient does not meet criteria for psychiatric inpatient admission. Supportive  therapy provided about ongoing stressors. Unit social worker to assist with outpatient follow up appointment  AkiCorena PilgrimD 11/01/2015 1:15 PM

## 2015-11-02 ENCOUNTER — Inpatient Hospital Stay (HOSPITAL_COMMUNITY): Payer: Medicare Other

## 2015-11-02 ENCOUNTER — Encounter (HOSPITAL_COMMUNITY): Payer: Self-pay | Admitting: General Surgery

## 2015-11-02 LAB — CBC WITH DIFFERENTIAL/PLATELET
Basophils Absolute: 0 10*3/uL (ref 0.0–0.1)
Basophils Relative: 0 %
Eosinophils Absolute: 0.4 10*3/uL (ref 0.0–0.7)
Eosinophils Relative: 1 %
HCT: 28.3 % — ABNORMAL LOW (ref 39.0–52.0)
Hemoglobin: 9.2 g/dL — ABNORMAL LOW (ref 13.0–17.0)
Lymphocytes Relative: 4 %
Lymphs Abs: 1.6 10*3/uL (ref 0.7–4.0)
MCH: 28.2 pg (ref 26.0–34.0)
MCHC: 32.5 g/dL (ref 30.0–36.0)
MCV: 86.8 fL (ref 78.0–100.0)
Monocytes Absolute: 1.6 10*3/uL — ABNORMAL HIGH (ref 0.1–1.0)
Monocytes Relative: 4 %
Neutro Abs: 35.8 10*3/uL — ABNORMAL HIGH (ref 1.7–7.7)
Neutrophils Relative %: 91 %
Platelets: 424 10*3/uL — ABNORMAL HIGH (ref 150–400)
RBC: 3.26 MIL/uL — ABNORMAL LOW (ref 4.22–5.81)
RDW: 15.2 % (ref 11.5–15.5)
WBC: 39.4 10*3/uL — ABNORMAL HIGH (ref 4.0–10.5)

## 2015-11-02 LAB — COMPREHENSIVE METABOLIC PANEL
ALT: 7 U/L — ABNORMAL LOW (ref 17–63)
AST: 9 U/L — ABNORMAL LOW (ref 15–41)
Albumin: 2.5 g/dL — ABNORMAL LOW (ref 3.5–5.0)
Alkaline Phosphatase: 158 U/L — ABNORMAL HIGH (ref 38–126)
Anion gap: 5 (ref 5–15)
BUN: 11 mg/dL (ref 6–20)
CO2: 18 mmol/L — ABNORMAL LOW (ref 22–32)
Calcium: 10.7 mg/dL — ABNORMAL HIGH (ref 8.9–10.3)
Chloride: 113 mmol/L — ABNORMAL HIGH (ref 101–111)
Creatinine, Ser: 1.11 mg/dL (ref 0.61–1.24)
GFR calc Af Amer: >60 mL/min (ref >60)
GFR calc non Af Amer: >60 mL/min (ref >60)
Glucose, Bld: 85 mg/dL (ref 65–99)
Potassium: 3.9 mmol/L (ref 3.5–5.1)
Sodium: 136 mmol/L (ref 135–145)
Total Bilirubin: 0.3 mg/dL (ref 0.3–1.2)
Total Protein: 6.1 g/dL — ABNORMAL LOW (ref 6.5–8.1)

## 2015-11-02 LAB — CALCIUM, IONIZED: Calcium, Ionized, Serum: 7.8 mg/dL — ABNORMAL HIGH (ref 4.5–5.6)

## 2015-11-02 LAB — PARATHYROID HORMONE, INTACT (NO CA): PTH: 4 pg/mL — ABNORMAL LOW (ref 15–65)

## 2015-11-02 MED ORDER — FLUMAZENIL 0.5 MG/5ML IV SOLN
INTRAVENOUS | Status: AC
  Filled 2015-11-02: qty 5

## 2015-11-02 MED ORDER — NALOXONE HCL 0.4 MG/ML IJ SOLN
INTRAMUSCULAR | Status: AC
  Filled 2015-11-02: qty 1

## 2015-11-02 MED ORDER — MIDAZOLAM HCL 2 MG/2ML IJ SOLN
INTRAMUSCULAR | Status: AC | PRN
  Administered 2015-11-02: 0.5 mg via INTRAVENOUS
  Administered 2015-11-02: 1 mg via INTRAVENOUS

## 2015-11-02 MED ORDER — FENTANYL CITRATE (PF) 100 MCG/2ML IJ SOLN
INTRAMUSCULAR | Status: AC
  Filled 2015-11-02: qty 2

## 2015-11-02 MED ORDER — FENTANYL CITRATE (PF) 100 MCG/2ML IJ SOLN
INTRAMUSCULAR | Status: AC | PRN
  Administered 2015-11-02: 25 ug via INTRAVENOUS
  Administered 2015-11-02: 50 ug via INTRAVENOUS

## 2015-11-02 MED ORDER — DEXTROSE 5 % IV SOLN
10.0000 mmol | Freq: Once | INTRAVENOUS | Status: AC
  Administered 2015-11-02: 10 mmol via INTRAVENOUS
  Filled 2015-11-02: qty 3.33

## 2015-11-02 MED ORDER — MIDAZOLAM HCL 2 MG/2ML IJ SOLN
INTRAMUSCULAR | Status: AC
  Filled 2015-11-02: qty 2

## 2015-11-02 NOTE — Consult Note (Signed)
Chief Complaint: Right lung mass  Referring Physician: Dr. Toy Baker  Supervising Physician: Aletta Edouard  Patient Status: In-pt   HPI: Keith Murray is an 55 y.o. male with a history of polysubstance abuse, schizoaffective disorder, alcohol dependence, and bipolar depression.  He states that he has had sublingual and on his chest and his right abdominal wall for the last year. He endorses a cough for the last couple of months. He also complains of some shortness of breath for the last couple of months as well.  Apparently the patient was diagnosed with a lung mass in May but never followed up.  The patient has had an elevated white blood cell count for the last several months. In June his white blood cell count was noted to be 22,000. Upon arrival 2 days ago his white blood cell count was 40,000. He denies any fevers. He has also had a CT scan which reveals a massive right sided apical lung mass. He also has significant lymphadenopathy noted throughout. He also has a small subcutaneous soft tissue mass in his right abdominal wall.  We have been asked to evaluate the patient for a biopsy.  Past Medical History:  Past Medical History:  Diagnosis Date  . Anxiety   . Polysubstance abuse    cocaine, etoh, marijuana  . Psychosis   . Schizoaffective disorder [facility])     Past Surgical History:  Past Surgical History:  Procedure Laterality Date  . HERNIA REPAIR      Family History:  Family History  Problem Relation Age of Onset  . Cancer Neg Hx   . Schizophrenia Neg Hx     Social History:  reports that he has been smoking.  He has a 2.00 pack-year smoking history. He has never used smokeless tobacco. He reports that he uses drugs, including Cocaine and Marijuana. He reports that he does not drink alcohol.  Allergies: No Known Allergies  Medications: Medications reviewed in Epic  Please HPI for pertinent positives, otherwise complete 10 system ROS negative.  Mallampati  Score: MD Evaluation Airway: WNL Heart: WNL Abdomen: WNL Chest/ Lungs: WNL ASA  Classification: 3 Mallampati/Airway Score: Two  Physical Exam: BP (!) 92/58 (BP Location: Left Arm)   Pulse 94   Temp 97.8 F (36.6 C) (Oral)   Resp 18   Ht _0  (1.88 m)   Wt 182 lb 15.7 oz (83 kg)   SpO2 100%   BMI 23.49 kg/m  Body mass index is 23.49 kg/m. General: Frail appearing black male who is laying in bed in NAD HEENT: head is normocephalic, atraumatic.  Sclera are noninjected.  PERRL.  Ears and nose without any masses or lesions.  Mouth is pink and dentition is poor. Heart: regular, rate, and rhythm.  Normal s1,s2. No obvious murmurs, gallops, or rubs noted.  Palpable radial and pedal pulses bilaterally Lungs: Diffuse rhonchus sounds throughout both lungs. Respiratory effort is mildly labored and tachypnea is noted. Abd: soft, NT, ND, +BS, no hernias, or organomegaly.  He does have a golf ball size hard mass noted in the right lateral abdominal wall. MS: all 4 extremities are symmetrical with no cyanosis or edema, but he does have clubbing of his fingernails. Psych: A&Ox3 but moved very rapidly changes from anger to happy.   Labs: Results for orders placed or performed during the hospital encounter of 10/31/15 (from the past 48 hour(s))  Urinalysis, Routine w reflex microscopic     Status: None   Collection Time: 10/31/15  12:43 PM  Result Value Ref Range   Color, Urine YELLOW YELLOW   APPearance CLEAR CLEAR   Specific Gravity, Urine 1.013 1.005 - 1.030   pH 6.0 5.0 - 8.0   Glucose, UA NEGATIVE NEGATIVE mg/dL   Hgb urine dipstick NEGATIVE NEGATIVE   Bilirubin Urine NEGATIVE NEGATIVE   Ketones, ur NEGATIVE NEGATIVE mg/dL   Protein, ur NEGATIVE NEGATIVE mg/dL   Nitrite NEGATIVE NEGATIVE   Leukocytes, UA NEGATIVE NEGATIVE    Comment: MICROSCOPIC NOT DONE ON URINES WITH NEGATIVE PROTEIN, BLOOD, LEUKOCYTES, NITRITE, OR GLUCOSE <1000 mg/dL.  Lipase, blood     Status: None   Collection  Time: 10/31/15 12:49 PM  Result Value Ref Range   Lipase 16 11 - 51 U/L  Comprehensive metabolic panel     Status: Abnormal   Collection Time: 10/31/15 12:49 PM  Result Value Ref Range   Sodium 131 (L) 135 - 145 mmol/L   Potassium 3.4 (L) 3.5 - 5.1 mmol/L   Chloride 103 101 - 111 mmol/L   CO2 21 (L) 22 - 32 mmol/L   Glucose, Bld 127 (H) 65 - 99 mg/dL   BUN 14 6 - 20 mg/dL   Creatinine, Ser 1.17 0.61 - 1.24 mg/dL   Calcium 13.2 (HH) 8.9 - 10.3 mg/dL    Comment: CRITICAL RESULT CALLED TO, READ BACK BY AND VERIFIED WITH: DURRY,K RN [id number] COVINGTON,N    Total Protein 7.3 6.5 - 8.1 g/dL   Albumin 2.9 (L) 3.5 - 5.0 g/dL   AST 13 (L) 15 - 41 U/L   ALT 10 (L) 17 - 63 U/L   Alkaline Phosphatase 201 (H) 38 - 126 U/L   Total Bilirubin 0.4 0.3 - 1.2 mg/dL   GFR calc non Af Amer >60 >60 mL/min   GFR calc Af Amer >60 >60 mL/min    Comment: (NOTE) The eGFR has been calculated using the CKD EPI equation. This calculation has not been validated in all clinical situations. eGFR's persistently <60 mL/min signify possible Chronic Kidney Disease.    Anion gap 7 5 - 15  CBC     Status: Abnormal   Collection Time: 10/31/15 12:49 PM  Result Value Ref Range   WBC 40.7 (H) 4.0 - 10.5 K/uL    Comment: RESULT REPEATED AND VERIFIED   RBC 3.66 (L) 4.22 - 5.81 MIL/uL   Hemoglobin 10.4 (L) 13.0 - 17.0 g/dL   HCT 31.7 (L) 39.0 - 52.0 %   MCV 86.6 78.0 - 100.0 fL   MCH 28.4 26.0 - 34.0 pg   MCHC 32.8 30.0 - 36.0 g/dL   RDW 14.9 11.5 - 15.5 %   Platelets 391 150 - 400 K/uL  CBC with Differential/Platelet     Status: Abnormal   Collection Time: 10/31/15  7:06 PM  Result Value Ref Range   WBC 45.5 (H) 4.0 - 10.5 K/uL    Comment: RESULT REPEATED AND VERIFIED   RBC 3.46 (L) 4.22 - 5.81 MIL/uL   Hemoglobin 9.9 (L) 13.0 - 17.0 g/dL   HCT 29.6 (L) 39.0 - 52.0 %   MCV 85.5 78.0 - 100.0 fL   MCH 28.6 26.0 - 34.0 pg   MCHC 33.4 30.0 - 36.0 g/dL   RDW 14.7 11.5 - 15.5 %   Platelets 438 (H) 150 - 400  K/uL   Neutrophils Relative % 91 %   Lymphocytes Relative 6 %   Monocytes Relative 3 %   Eosinophils Relative 0 %   Basophils Relative  0 %   Neutro Abs 41.4 (H) 1.7 - 7.7 K/uL   Lymphs Abs 2.7 0.7 - 4.0 K/uL   Monocytes Absolute 1.4 (H) 0.1 - 1.0 K/uL   Eosinophils Absolute 0.0 0.0 - 0.7 K/uL   Basophils Absolute 0.0 0.0 - 0.1 K/uL   Smear Review MORPHOLOGY UNREMARKABLE   Strep pneumoniae urinary antigen     Status: None   Collection Time: 10/31/15  8:52 PM  Result Value Ref Range   Strep Pneumo Urinary Antigen NEGATIVE NEGATIVE    Comment: PERFORMED AT [facility]        Infection due to S. pneumoniae cannot be absolutely ruled out since the antigen present may be below the detection limit of the test. Performed at [facility]   Urine rapid drug screen (hosp performed)     Status: None   Collection Time: 10/31/15  8:52 PM  Result Value Ref Range   Opiates NONE DETECTED NONE DETECTED   Cocaine NONE DETECTED NONE DETECTED   Benzodiazepines NONE DETECTED NONE DETECTED   Amphetamines NONE DETECTED NONE DETECTED   Tetrahydrocannabinol NONE DETECTED NONE DETECTED   Barbiturates NONE DETECTED NONE DETECTED    Comment:        DRUG SCREEN FOR MEDICAL PURPOSES ONLY.  IF CONFIRMATION IS NEEDED FOR ANY PURPOSE, NOTIFY LAB WITHIN 5 DAYS.        LOWEST DETECTABLE LIMITS FOR URINE DRUG SCREEN Drug Class       Cutoff (ng/mL) Amphetamine      1000 Barbiturate      200 Benzodiazepine   017 Tricyclics       793 Opiates          300 Cocaine          300 THC              50   Ethanol     Status: None   Collection Time: 10/31/15 10:05 PM  Result Value Ref Range   Alcohol, Ethyl (B) <5 <5 mg/dL    Comment:        LOWEST DETECTABLE LIMIT FOR SERUM ALCOHOL IS 5 mg/dL FOR MEDICAL PURPOSES ONLY   Calcium     Status: Abnormal   Collection Time: 10/31/15 11:50 PM  Result Value Ref Range   Calcium 11.7 (H) 8.9 - 10.3 mg/dL  HIV antibody     Status: None    Collection Time: 11/01/15  5:31 AM  Result Value Ref Range   HIV Screen 4th Generation wRfx Non Reactive Non Reactive    Comment: (NOTE) Performed At: [facility] [place], [place] [id number] Lindon Romp MD AQ:[id number]   Magnesium     Status: None   Collection Time: 11/01/15  5:31 AM  Result Value Ref Range   Magnesium 1.8 1.7 - 2.4 mg/dL  Phosphorus     Status: Abnormal   Collection Time: 11/01/15  5:31 AM  Result Value Ref Range   Phosphorus 1.6 (L) 2.5 - 4.6 mg/dL  TSH     Status: None   Collection Time: 11/01/15  5:31 AM  Result Value Ref Range   TSH 1.393 0.350 - 4.500 uIU/mL  Comprehensive metabolic panel     Status: Abnormal   Collection Time: 11/01/15  5:31 AM  Result Value Ref Range   Sodium 135 135 - 145 mmol/L   Potassium 3.7 3.5 - 5.1 mmol/L   Chloride 110 101 - 111 mmol/L   CO2 21 (L) 22 - 32  mmol/L   Glucose, Bld 95 65 - 99 mg/dL   BUN 13 6 - 20 mg/dL   Creatinine, Ser 1.06 0.61 - 1.24 mg/dL   Calcium 11.0 (H) 8.9 - 10.3 mg/dL   Total Protein 6.1 (L) 6.5 - 8.1 g/dL   Albumin 2.6 (L) 3.5 - 5.0 g/dL   AST 11 (L) 15 - 41 U/L   ALT 7 (L) 17 - 63 U/L   Alkaline Phosphatase 172 (H) 38 - 126 U/L   Total Bilirubin 0.6 0.3 - 1.2 mg/dL   GFR calc non Af Amer >60 >60 mL/min   GFR calc Af Amer >60 >60 mL/min    Comment: (NOTE) The eGFR has been calculated using the CKD EPI equation. This calculation has not been validated in all clinical situations. eGFR's persistently <60 mL/min signify possible Chronic Kidney Disease.    Anion gap 4 (L) 5 - 15  CBC     Status: Abnormal   Collection Time: 11/01/15  5:31 AM  Result Value Ref Range   WBC 40.0 (H) 4.0 - 10.5 K/uL   RBC 3.15 (L) 4.22 - 5.81 MIL/uL   Hemoglobin 8.8 (L) 13.0 - 17.0 g/dL   HCT 27.1 (L) 39.0 - 52.0 %   MCV 86.0 78.0 - 100.0 fL   MCH 27.9 26.0 - 34.0 pg   MCHC 32.5 30.0 - 36.0 g/dL   RDW 14.9 11.5 - 15.5 %   Platelets 405 (H) 150 - 400 K/uL  Protime-INR     Status:  None   Collection Time: 11/01/15  5:31 AM  Result Value Ref Range   Prothrombin Time 14.5 11.4 - 15.2 seconds   INR 1.13   CBC with Differential/Platelet     Status: Abnormal   Collection Time: 11/02/15  5:12 AM  Result Value Ref Range   WBC 39.4 (H) 4.0 - 10.5 K/uL   RBC 3.26 (L) 4.22 - 5.81 MIL/uL   Hemoglobin 9.2 (L) 13.0 - 17.0 g/dL   HCT 28.3 (L) 39.0 - 52.0 %   MCV 86.8 78.0 - 100.0 fL   MCH 28.2 26.0 - 34.0 pg   MCHC 32.5 30.0 - 36.0 g/dL   RDW 15.2 11.5 - 15.5 %   Platelets 424 (H) 150 - 400 K/uL   Neutrophils Relative % 91 %   Lymphocytes Relative 4 %   Monocytes Relative 4 %   Eosinophils Relative 1 %   Basophils Relative 0 %   Neutro Abs 35.8 (H) 1.7 - 7.7 K/uL   Lymphs Abs 1.6 0.7 - 4.0 K/uL   Monocytes Absolute 1.6 (H) 0.1 - 1.0 K/uL   Eosinophils Absolute 0.4 0.0 - 0.7 K/uL   Basophils Absolute 0.0 0.0 - 0.1 K/uL   WBC Morphology VACUOLATED NEUTROPHILS   Comprehensive metabolic panel     Status: Abnormal   Collection Time: 11/02/15  5:12 AM  Result Value Ref Range   Sodium 136 135 - 145 mmol/L   Potassium 3.9 3.5 - 5.1 mmol/L   Chloride 113 (H) 101 - 111 mmol/L   CO2 18 (L) 22 - 32 mmol/L   Glucose, Bld 85 65 - 99 mg/dL   BUN 11 6 - 20 mg/dL   Creatinine, Ser 1.11 0.61 - 1.24 mg/dL   Calcium 10.7 (H) 8.9 - 10.3 mg/dL   Total Protein 6.1 (L) 6.5 - 8.1 g/dL   Albumin 2.5 (L) 3.5 - 5.0 g/dL   AST 9 (L) 15 - 41 U/L   ALT 7 (L) 17 -  63 U/L   Alkaline Phosphatase 158 (H) 38 - 126 U/L   Total Bilirubin 0.3 0.3 - 1.2 mg/dL   GFR calc non Af Amer >60 >60 mL/min   GFR calc Af Amer >60 >60 mL/min    Comment: (NOTE) The eGFR has been calculated using the CKD EPI equation. This calculation has not been validated in all clinical situations. eGFR's persistently <60 mL/min signify possible Chronic Kidney Disease.    Anion gap 5 5 - 15    Imaging: Dg Chest 2 View  Result Date: 10/31/2015 CLINICAL DATA:  Right chest pain. EXAM: CHEST  2 VIEW COMPARISON:  Jul 27, 2015 FINDINGS: The mass in the right upper chest is significantly larger in the interval measuring up to 15 cm in cranial caudal dimension today versus 10.6 cm previously. There is associated erosion of multiple ribs which was not seen previously in the adjacent ribs. The heart, hila, mediastinum, left lung, and lower right lung are otherwise unchanged. IMPRESSION: Enlarging right upper lobe pulmonary mass which may be pleural based. There is erosion of multiple right-sided ribs which was not seen previously. A chest CT could better evaluate. Electronically Signed   By: Dorise Bullion III M.D   On: 10/31/2015 13:34   Ct Chest W Contrast  Result Date: 10/31/2015 CLINICAL DATA:  Evaluate large right upper lobe mass. EXAM: CT CHEST WITH CONTRAST TECHNIQUE: Multidetector CT imaging of the chest was performed during intravenous contrast administration. CONTRAST:  65m ISOVUE-300 IOPAMIDOL (ISOVUE-300) INJECTION 61% COMPARISON:  Plain film of the chest from earlier today. FINDINGS: Cardiovascular: Thoracic aorta is normal in caliber and configuration. Heart size is normal. No pericardial effusion. Coronary artery calcifications noted. Mediastinum/Nodes: Conglomerate lymphadenopathy in the right hilum, measuring 3.8 x 2.4 cm. Additional conglomerate lymphadenopathy in the right lower peritracheal space, measuring up to 1.4 cm short axis dimension. Scattered smaller lymph nodes within the upper peritracheal and aortopulmonary window regions. Numerous enlarged and morphologically abnormal lymph nodes are seen within the right axilla. Lungs/Pleura: Massive solid-appearing mass in the right lung apex, measuring approximately 17 x 12 x 13 cm, centered in the right lung apex but extending into the overlying anterior and lateral right chest wall. There are destructive/erosive changes of the right first through fourth ribs. Diffuse ground-glass opacities along the medial and inferior margins of the right upper lobe mass could  be edema or lymphangitic carcinomatosis. There is a moderate-sized right pleural effusion. Left lung is clear. Upper Abdomen: Limited images of the upper abdomen are unremarkable. Musculoskeletal: Destructive changes of the right first through fourth ribs by the right upper lobe mass. No distant osseous metastases seen. As above, the right upper lobe mass extends outward into the anterior and lateral right chest wall. Ill-defined fluid/edema is seen within the lower portions of the right chest wall, likely related to associated lymphatic and/or vascular obstruction. IMPRESSION: 1. Massive solid-appearing mass in the right lung apex, consistent with Pancoast tumor, measuring approximately 17 x 12 x 13 cm, centered in the right lung apex but extending peripherally into the overlying anterior and lateral right chest wall. Associated destructive/erosive changes of the right first through fourth ribs. 2. Diffuse ground-glass opacities along the medial and inferior margins of the right upper lobe mass could be postobstructive edema or lymphangitic carcinomatosis. 3. Right pleural effusion, moderate-sized. 4. Conglomerate lymphadenopathy in the mediastinum and right hilum, almost certainly metastatic disease. Additional lymphadenopathy in the right axilla. 5. Ill-defined fluid/edema within the subcutaneous soft tissues of the right lateral  chest wall, presumably related to associated ectatic and/or vascular obstruction. Electronically Signed   By: Franki Cabot M.D.   On: 10/31/2015 17:31   Mr Jeri Cos YJ Contrast  Result Date: 11/01/2015 CLINICAL DATA:  Lung cancer.  Staging. EXAM: MRI HEAD WITHOUT AND WITH CONTRAST TECHNIQUE: Multiplanar, multiecho pulse sequences of the brain and surrounding structures were obtained without and with intravenous contrast. CONTRAST:  34m MULTIHANCE GADOBENATE DIMEGLUMINE 529 MG/ML IV SOLN COMPARISON:  None. FINDINGS: Motion degraded exam, primarily affecting postcontrast sequences.  Calvarium and upper cervical spine: Hypo intense appearance of cervical spine marrow with patchy hypo intense appearance of the clivus. These areas are homogeneously hypo enhancing. Orbits: Negative. Sinuses and Mastoids: Clear. Brain: No abnormal enhancement or edema to suggest intracranial metastasis. Mild white matter disease for age with nonspecific pattern of scattered FLAIR hyperintensities, usually chronic microvascular ischemia. Normal brain volume. No acute or remote infarct, hemorrhage, hydrocephalus, or major vessel occlusion. IMPRESSION: 1. Negative for intracranial metastatic disease. 2. Abnormal marrow which could be from anemia or metastatic disease. Electronically Signed   By: JMonte FantasiaM.D.   On: 11/01/2015 18:54   Ct Abdomen Pelvis W Contrast  Result Date: 11/01/2015 CLINICAL DATA:  Elevated white count and elevated calcium in the. Chest x-ray at that time showed possible right upper lung mass. Today presenting with right chest pain intermittent for months. Weight loss since May AP EXAM: CT ABDOMEN AND PELVIS WITH CONTRAST TECHNIQUE: Multidetector CT imaging of the abdomen and pelvis was performed using the standard protocol following bolus administration of intravenous contrast. CONTRAST:  1075mISOVUE-300 IOPAMIDOL (ISOVUE-300) INJECTION 61% COMPARISON:  Chest CT dated 10/31/2015 and CT abdomen dated 07/15/2010. FINDINGS: Lower chest: Pleural effusion at the right lung base, consistent with the moderate to large pleural effusion appreciated on the earlier chest CT. Hepatobiliary: No mass or focal lesion identified within the liver. Gallbladder appears normal. Pancreas: No mass, inflammatory changes, or other significant abnormality. Spleen: Within normal limits in size and appearance. Adrenals/Urinary Tract: Adrenal glands appear normal. Punctate nonobstructing left renal stone. No associated hydronephrosis. Right kidney appears normal without stone or hydronephrosis. Bladder is  unremarkable. Stomach/Bowel: Bowel is normal in caliber. Fairly large amount of stool and gas throughout the colon. Stomach appears normal. Vascular/Lymphatic: Abdominal aorta is normal in caliber. No enlarged lymph nodes seen. Reproductive: No mass or other significant abnormality. Other: Hypodense mass is seen within the left upper quadrant, measuring 2.4 x 1.8 x 3 cm, located adjacent to the lateral margin of the left kidney and adjacent to the pancreatic tail. Based on the coronal reconstructions, this mass does not appear to be originating from the kidney or pancreas. Suspect metastatic soft tissue implant. There is a soft tissue mass within the subcutaneous soft tissues of the right abdomen, just below the level of the liver, measuring 2.7 x 2.4 cm, highly suspicious for metastatic soft tissue implant. Small amount of free fluid in the lower pelvis. No circumscribed abscess collection identified. No free intraperitoneal air. Within the musculature immediately lateral to the lower left iliac bone, there is a hypodense mass/collection measuring 2.9 x 2 cm, of uncertain etiology or chronicity. Ill-defined edema is present throughout the remainder of the subcutaneous soft tissues suggesting anasarca. Musculoskeletal: Degenerative changes within the spine and at the hips, mild to moderate in degree. No acute or suspicious osseous finding. Probable AVN at the left femoral head. IMPRESSION: 1. Right pleural effusion, as seen on yesterday's chest CT. Almost certainly malignant effusion given the large  Pancoast tumor identified on chest CT. 2. Soft tissue mass within the subcutaneous soft tissues of the right abdomen, measuring 2.7 x 2.4 cm, highly suspicious for metastatic soft tissue implant. 3. Hypodense rounded mass in the left upper quadrant, adjacent to the lateral margin of the left kidney and pancreatic tail, measuring 2.4 x 1.8 x 3 cm. Based on the coronal reconstructions, this is most likely not originating  from the kidney or pancreas, suspected metastatic soft tissue implant. 4. Additional hypodense mass/collection just lateral to the lower left iliac bone, measuring 2.9 x 2 cm, of less suspicious appearance than the findings described above but could represent additional metastatic implant. 5. Small amount of free fluid in the lower pelvis.  Anasarca. 6. Fairly large amount of stool and gas throughout the colon (constipation? ). No evidence of bowel obstruction. 7. No evidence of osseous metastasis. Probable chronic AVN of the left femoral head. Electronically Signed   By: Franki Cabot M.D.   On: 11/01/2015 17:54    Assessment/Plan 1. Large right apical lung mass with right abdominal wall mass -Given the mass in the lung appears to have some necrotic tissue, we will plan to proceed with a biopsy of the right abdominal wall mass. This will likely give [place] a better chance at diagnosis. This was discussed with the patient. He has been evaluated by a psychiatry and cleared to make his own medical decisions. The patient is agreeable to proceed with this biopsy. -Labs and vitals have been reviewed -Risks and Benefits discussed with the patient including, but not limited to bleeding, infection, damage to adjacent structures or low yield requiring additional tests. All of the patient's questions were answered, patient is agreeable to proceed. Consent signed and in chart.   Thank you for this interesting consult.  I greatly enjoyed meeting Keith Murray and look forward to participating in their care.  A copy of this report was sent to the requesting provider on this date.  Electronically Signed: Henreitta Cea 11/02/2015, 10:17 AM   I spent a total of 40 Minutes    in face to face in clinical consultation, greater than 50% of which was counseling/coordinating care for right abdominal wall mass and right lung mass

## 2015-11-02 NOTE — Progress Notes (Signed)
PROGRESS NOTE    Keith Murray  ZOX:[id number]  DOB: 27-Nov-1960  DOA: 10/31/2015 PCP: Grant Fontana, MD (Inactive) Outpatient Specialists:  Hospital course: Keith Murray is a 55 y.o. male with medical history significant of cocaine and alcohol abuse schizoaffective disorder and bipolar disorder.  Patient was found to have elevated white blood cell count and calcium in May he had chest x-ray done that showed possible right upper lung mass today he presents to obtain CT scan.  He endorses having right chest pain has been going on for months feels sharp and not associated with cough and fever nausea vomiting or abdominal pain. She leaves to his niece Keith Murray. Per family he was diagnosed with lung mass in May but never followed up he's been losing weight.  Review of records in May his calcium was already elevated at 11.5 white blood cell count was up to 22.5 also in May in January white blood cell, was elevated 12.0 predominantly neutrophils  Assessment & Plan:   1. Hypercalcemia of malignancy (suspected) - improving with aggressive therapy with calcitonin and pamidronate, IV normal saline.  Continue telemetry.  Repeat in AM.   2. Lung Mass - right, Pt consented for tissue biopsy by IR.  Likely for 8/7.  They will biopsy the abdominal mass which is likely metastatic spread.  Plan to consult oncologist 8/8 - hopefully will have prelim tissue pathology by then.  3. ?Postobstructive Pneumonia - Continue IV Zosyn for now.    4. Abdominal Mass - Pt to have it biopsied by IR.    5. Leukocytosis - Trending down slightly.   6. Schizoaffective Disorder - behavioral health consult requested.   7. History of polysubstance abuse - urine toxicology screen negative.    DVT prophylaxis: SCDs Code Status: FULL Family Communication: Met with nieces  [phone] Keith Murray patient also provided phone number of his other niece on the phone [phone] Disposition Plan:  TBD  Consultants: [place], pharmacy, order for IR cosult  Procedures:  Biopsy 11/02/15  Antimicrobials: Anti-infectives    Start     Dose/Rate Route Frequency Ordered Stop   10/31/15 2100  piperacillin-tazobactam (ZOSYN) IVPB 3.375 g     3.375 g 12.5 mL/hr over 240 Minutes Intravenous Every 8 hours 10/31/15 2051        Subjective: Pt without complaints was able to be calmed down with ativan as needed, continuing his home psychiatric meds.   Objective: Vitals:   11/01/15 0900 11/01/15 1536 11/01/15 2048 11/02/15 0531  BP: 112/64 110/75 120/73 (!) 92/58  Pulse: 94 90 97 94  Resp: _0 Temp:  98.6 F (37 C) 98.1 F (36.7 C) 97.8 F (36.6 C)  TempSrc:  Oral Oral Oral  SpO2: 100% 98% 96% 100%  Weight:      Height:        Intake/Output Summary (Last 24 hours) at 11/02/15 1141 Last data filed at 11/02/15 8469  Gross per 24 hour  Intake          2716.25 ml  Output                0 ml  Net          2716.25 ml   Filed Weights   10/31/15 1240 10/31/15 2053  Weight: 83 kg (183 lb) 83 kg (182 lb 15.7 oz)   Exam:  General exam: emaciated chronically ill appearing male, no apparent distress Respiratory system: diminished BS RUL RML Cardiovascular system: S1 &  S2 [place], RRR. No JVD, murmurs, gallops, clicks or pedal edema. Gastrointestinal system: Abdomen is nondistended, soft and nontender. Firm Mass over RLQ appears to be subcutaneous, Normal bowel sounds [place]. Central nervous system: Alert and oriented. No focal neurological deficits. Extremities: severe clubbing noted in fingers bilateral hands.  Data Reviewed: Basic Metabolic Panel:  Recent Labs Lab 10/31/15 1249 10/31/15 2350 11/01/15 0531 11/02/15 0512  NA 131*  --  135 136  K 3.4*  --  3.7 3.9  CL 103  --  110 113*  CO2 21*  --  21* 18*  GLUCOSE 127*  --  95 85  BUN 14  --  13 11  CREATININE 1.17  --  1.06 1.11  CALCIUM 13.2* 11.7* 11.0* 10.7*  MG  --   --  1.8  --   PHOS  --   --   1.6*  --    Liver Function Tests:  Recent Labs Lab 10/31/15 1249 11/01/15 0531 11/02/15 0512  AST 13* 11* 9*  ALT 10* 7* 7*  ALKPHOS 201* 172* 158*  BILITOT 0.4 0.6 0.3  PROT 7.3 6.1* 6.1*  ALBUMIN 2.9* 2.6* 2.5*    Recent Labs Lab 10/31/15 1249  LIPASE 16   No results for input(s): AMMONIA in the last 168 hours. CBC:  Recent Labs Lab 10/31/15 1249 10/31/15 1906 11/01/15 0531 11/02/15 0512  WBC 40.7* 45.5* 40.0* 39.4*  NEUTROABS  --  41.4*  --  35.8*  HGB 10.4* 9.9* 8.8* 9.2*  HCT 31.7* 29.6* 27.1* 28.3*  MCV 86.6 85.5 86.0 86.8  PLT 391 438* 405* 424*   Cardiac Enzymes: No results for input(s): CKTOTAL, CKMB, CKMBINDEX, TROPONINI in the last 168 hours. CBG (last 3)  No results for input(s): GLUCAP in the last 72 hours. No results found for this or any previous visit (from the past 240 hour(s)).   Studies: Dg Chest 2 View  Result Date: 10/31/2015 CLINICAL DATA:  Right chest pain. EXAM: CHEST  2 VIEW COMPARISON:  Jul 27, 2015 FINDINGS: The mass in the right upper chest is significantly larger in the interval measuring up to 15 cm in cranial caudal dimension today versus 10.6 cm previously. There is associated erosion of multiple ribs which was not seen previously in the adjacent ribs. The heart, hila, mediastinum, left lung, and lower right lung are otherwise unchanged. IMPRESSION: Enlarging right upper lobe pulmonary mass which may be pleural based. There is erosion of multiple right-sided ribs which was not seen previously. A chest CT could better evaluate. Electronically Signed   By: Dorise Bullion III M.D   On: 10/31/2015 13:34   Ct Chest W Contrast  Result Date: 10/31/2015 CLINICAL DATA:  Evaluate large right upper lobe mass. EXAM: CT CHEST WITH CONTRAST TECHNIQUE: Multidetector CT imaging of the chest was performed during intravenous contrast administration. CONTRAST:  44m ISOVUE-300 IOPAMIDOL (ISOVUE-300) INJECTION 61% COMPARISON:  Plain film of the chest from  earlier today. FINDINGS: Cardiovascular: Thoracic aorta is normal in caliber and configuration. Heart size is normal. No pericardial effusion. Coronary artery calcifications noted. Mediastinum/Nodes: Conglomerate lymphadenopathy in the right hilum, measuring 3.8 x 2.4 cm. Additional conglomerate lymphadenopathy in the right lower peritracheal space, measuring up to 1.4 cm short axis dimension. Scattered smaller lymph nodes within the upper peritracheal and aortopulmonary window regions. Numerous enlarged and morphologically abnormal lymph nodes are seen within the right axilla. Lungs/Pleura: Massive solid-appearing mass in the right lung apex, measuring approximately 17 x 12 x 13 cm, centered in the  right lung apex but extending into the overlying anterior and lateral right chest wall. There are destructive/erosive changes of the right first through fourth ribs. Diffuse ground-glass opacities along the medial and inferior margins of the right upper lobe mass could be edema or lymphangitic carcinomatosis. There is a moderate-sized right pleural effusion. Left lung is clear. Upper Abdomen: Limited images of the upper abdomen are unremarkable. Musculoskeletal: Destructive changes of the right first through fourth ribs by the right upper lobe mass. No distant osseous metastases seen. As above, the right upper lobe mass extends outward into the anterior and lateral right chest wall. Ill-defined fluid/edema is seen within the lower portions of the right chest wall, likely related to associated lymphatic and/or vascular obstruction. IMPRESSION: 1. Massive solid-appearing mass in the right lung apex, consistent with Pancoast tumor, measuring approximately 17 x 12 x 13 cm, centered in the right lung apex but extending peripherally into the overlying anterior and lateral right chest wall. Associated destructive/erosive changes of the right first through fourth ribs. 2. Diffuse ground-glass opacities along the medial and  inferior margins of the right upper lobe mass could be postobstructive edema or lymphangitic carcinomatosis. 3. Right pleural effusion, moderate-sized. 4. Conglomerate lymphadenopathy in the mediastinum and right hilum, almost certainly metastatic disease. Additional lymphadenopathy in the right axilla. 5. Ill-defined fluid/edema within the subcutaneous soft tissues of the right lateral chest wall, presumably related to associated ectatic and/or vascular obstruction. Electronically Signed   By: Franki Cabot M.D.   On: 10/31/2015 17:31   Mr Jeri Cos VQ Contrast  Result Date: 11/01/2015 CLINICAL DATA:  Lung cancer.  Staging. EXAM: MRI HEAD WITHOUT AND WITH CONTRAST TECHNIQUE: Multiplanar, multiecho pulse sequences of the brain and surrounding structures were obtained without and with intravenous contrast. CONTRAST:  38m MULTIHANCE GADOBENATE DIMEGLUMINE 529 MG/ML IV SOLN COMPARISON:  None. FINDINGS: Motion degraded exam, primarily affecting postcontrast sequences. Calvarium and upper cervical spine: Hypo intense appearance of cervical spine marrow with patchy hypo intense appearance of the clivus. These areas are homogeneously hypo enhancing. Orbits: Negative. Sinuses and Mastoids: Clear. Brain: No abnormal enhancement or edema to suggest intracranial metastasis. Mild white matter disease for age with nonspecific pattern of scattered FLAIR hyperintensities, usually chronic microvascular ischemia. Normal brain volume. No acute or remote infarct, hemorrhage, hydrocephalus, or major vessel occlusion. IMPRESSION: 1. Negative for intracranial metastatic disease. 2. Abnormal marrow which could be from anemia or metastatic disease. Electronically Signed   By: JMonte FantasiaM.D.   On: 11/01/2015 18:54   Ct Abdomen Pelvis W Contrast  Result Date: 11/01/2015 CLINICAL DATA:  Elevated white count and elevated calcium in the. Chest x-ray at that time showed possible right upper lung mass. Today presenting with right chest  pain intermittent for months. Weight loss since May AP EXAM: CT ABDOMEN AND PELVIS WITH CONTRAST TECHNIQUE: Multidetector CT imaging of the abdomen and pelvis was performed using the standard protocol following bolus administration of intravenous contrast. CONTRAST:  1063mISOVUE-300 IOPAMIDOL (ISOVUE-300) INJECTION 61% COMPARISON:  Chest CT dated 10/31/2015 and CT abdomen dated 07/15/2010. FINDINGS: Lower chest: Pleural effusion at the right lung base, consistent with the moderate to large pleural effusion appreciated on the earlier chest CT. Hepatobiliary: No mass or focal lesion identified within the liver. Gallbladder appears normal. Pancreas: No mass, inflammatory changes, or other significant abnormality. Spleen: Within normal limits in size and appearance. Adrenals/Urinary Tract: Adrenal glands appear normal. Punctate nonobstructing left renal stone. No associated hydronephrosis. Right kidney appears normal without stone or hydronephrosis.  Bladder is unremarkable. Stomach/Bowel: Bowel is normal in caliber. Fairly large amount of stool and gas throughout the colon. Stomach appears normal. Vascular/Lymphatic: Abdominal aorta is normal in caliber. No enlarged lymph nodes seen. Reproductive: No mass or other significant abnormality. Other: Hypodense mass is seen within the left upper quadrant, measuring 2.4 x 1.8 x 3 cm, located adjacent to the lateral margin of the left kidney and adjacent to the pancreatic tail. Based on the coronal reconstructions, this mass does not appear to be originating from the kidney or pancreas. Suspect metastatic soft tissue implant. There is a soft tissue mass within the subcutaneous soft tissues of the right abdomen, just below the level of the liver, measuring 2.7 x 2.4 cm, highly suspicious for metastatic soft tissue implant. Small amount of free fluid in the lower pelvis. No circumscribed abscess collection identified. No free intraperitoneal air. Within the musculature  immediately lateral to the lower left iliac bone, there is a hypodense mass/collection measuring 2.9 x 2 cm, of uncertain etiology or chronicity. Ill-defined edema is present throughout the remainder of the subcutaneous soft tissues suggesting anasarca. Musculoskeletal: Degenerative changes within the spine and at the hips, mild to moderate in degree. No acute or suspicious osseous finding. Probable AVN at the left femoral head. IMPRESSION: 1. Right pleural effusion, as seen on yesterday's chest CT. Almost certainly malignant effusion given the large Pancoast tumor identified on chest CT. 2. Soft tissue mass within the subcutaneous soft tissues of the right abdomen, measuring 2.7 x 2.4 cm, highly suspicious for metastatic soft tissue implant. 3. Hypodense rounded mass in the left upper quadrant, adjacent to the lateral margin of the left kidney and pancreatic tail, measuring 2.4 x 1.8 x 3 cm. Based on the coronal reconstructions, this is most likely not originating from the kidney or pancreas, suspected metastatic soft tissue implant. 4. Additional hypodense mass/collection just lateral to the lower left iliac bone, measuring 2.9 x 2 cm, of less suspicious appearance than the findings described above but could represent additional metastatic implant. 5. Small amount of free fluid in the lower pelvis.  Anasarca. 6. Fairly large amount of stool and gas throughout the colon (constipation? ). No evidence of bowel obstruction. 7. No evidence of osseous metastasis. Probable chronic AVN of the left femoral head. Electronically Signed   By: Franki Cabot M.D.   On: 11/01/2015 17:54   Scheduled Meds: . benztropine  0.5 mg Oral BID  . FLUoxetine  20 mg Oral Daily  . haloperidol  5 mg Oral BID  . nicotine  21 mg Transdermal Daily  . piperacillin-tazobactam (ZOSYN)  IV  3.375 g Intravenous Q8H  . potassium phosphate IVPB (mmol)  10 mmol Intravenous Once  . sodium chloride flush  3 mL Intravenous Q12H  . traZODone  100  mg Oral QHS   Continuous Infusions: . sodium chloride 125 mL/hr at 11/02/15 0500   Principal Problem:   Schizoaffective disorder, bipolar type (HCC) Active Problems:   Polysubstance (excluding opioids) dependence (HCC)   Hypercalcemia of malignancy   Lung mass   Postobstructive pneumonia   Hypercalcemia   Leukocytosis  Time spent:   Irwin Brakeman, MD, FAAFP [facility] Pager [phone]  If 7PM-7AM, please contact night-coverage [email or web address] Password [email or web address] 11/02/2015, 11:41 AM    LOS: 2 days

## 2015-11-02 NOTE — Care Management Note (Signed)
Case Management Note  Patient Details  Name: Keith Murray MRN: [id number] Date of Birth: 01/08/1961  Subjective/Objective: 55 y/o m admitted w/leukocytosis,r lung mass,hypercalcemia.Hx: schizoaffective d/o-psych cleared recc otpt f/u-Psych sw to follow. onc following,iv abx,ivf. From home.                  Action/Plan:d/c plan home.   Expected Discharge Date:                  Expected Discharge Plan:  Home/Self Care  In-House Referral:     Discharge planning Services  CM Consult  Post Acute Care Choice:    Choice offered to:     DME Arranged:    DME Agency:     HH Arranged:    HH Agency:     Status of Service:  In process, will continue to follow  If discussed at Long Length of Stay Meetings, dates discussed:    Additional Comments:  Dessa Phi, RN 11/02/2015, 12:23 PM

## 2015-11-02 NOTE — Procedures (Signed)
Interventional Radiology Procedure Note  Procedure:  US guided core biopsy of right abdominal wall mass  Complications: None  Estimated Blood Loss: < 10 mL  Solid mass of superficial right anterior abdominal wall roughly 3.5 cm in greatest diameter by [place]. 18 G core biopsy x 4.  No complications.  Venetia Night. Kathlene Cote, M.D Pager:  [phone]

## 2015-11-03 DIAGNOSIS — C801 Malignant (primary) neoplasm, unspecified: Secondary | ICD-10-CM

## 2015-11-03 DIAGNOSIS — C7989 Secondary malignant neoplasm of other specified sites: Secondary | ICD-10-CM

## 2015-11-03 DIAGNOSIS — R918 Other nonspecific abnormal finding of lung field: Secondary | ICD-10-CM

## 2015-11-03 LAB — COMPREHENSIVE METABOLIC PANEL
ALT: 6 U/L — ABNORMAL LOW (ref 17–63)
AST: 10 U/L — ABNORMAL LOW (ref 15–41)
Albumin: 2.6 g/dL — ABNORMAL LOW (ref 3.5–5.0)
Alkaline Phosphatase: 172 U/L — ABNORMAL HIGH (ref 38–126)
Anion gap: 6 (ref 5–15)
BUN: 10 mg/dL (ref 6–20)
CO2: 19 mmol/L — ABNORMAL LOW (ref 22–32)
Calcium: 10.4 mg/dL — ABNORMAL HIGH (ref 8.9–10.3)
Chloride: 111 mmol/L (ref 101–111)
Creatinine, Ser: 1.09 mg/dL (ref 0.61–1.24)
GFR calc Af Amer: >60 mL/min (ref >60)
GFR calc non Af Amer: >60 mL/min (ref >60)
Glucose, Bld: 100 mg/dL — ABNORMAL HIGH (ref 65–99)
Potassium: 4.1 mmol/L (ref 3.5–5.1)
Sodium: 136 mmol/L (ref 135–145)
Total Bilirubin: 0.6 mg/dL (ref 0.3–1.2)
Total Protein: 6.3 g/dL — ABNORMAL LOW (ref 6.5–8.1)

## 2015-11-03 LAB — CBC WITH DIFFERENTIAL/PLATELET
Basophils Absolute: 0 10*3/uL (ref 0.0–0.1)
Basophils Relative: 0 %
Eosinophils Absolute: 0.4 10*3/uL (ref 0.0–0.7)
Eosinophils Relative: 1 %
HCT: 28.2 % — ABNORMAL LOW (ref 39.0–52.0)
Hemoglobin: 9 g/dL — ABNORMAL LOW (ref 13.0–17.0)
Lymphocytes Relative: 4 %
Lymphs Abs: 1.6 10*3/uL (ref 0.7–4.0)
MCH: 27.5 pg (ref 26.0–34.0)
MCHC: 31.9 g/dL (ref 30.0–36.0)
MCV: 86.2 fL (ref 78.0–100.0)
Monocytes Absolute: 1.6 10*3/uL — ABNORMAL HIGH (ref 0.1–1.0)
Monocytes Relative: 4 %
Neutro Abs: 35.5 10*3/uL — ABNORMAL HIGH (ref 1.7–7.7)
Neutrophils Relative %: 91 %
Platelets: 434 10*3/uL — ABNORMAL HIGH (ref 150–400)
RBC: 3.27 MIL/uL — ABNORMAL LOW (ref 4.22–5.81)
RDW: 15.2 % (ref 11.5–15.5)
WBC: 39.1 10*3/uL — ABNORMAL HIGH (ref 4.0–10.5)

## 2015-11-03 LAB — PHOSPHORUS: Phosphorus: 1.4 mg/dL — ABNORMAL LOW (ref 2.5–4.6)

## 2015-11-03 MED ORDER — SODIUM PHOSPHATES 45 MMOLE/15ML IV SOLN
30.0000 mmol | Freq: Once | INTRAVENOUS | Status: AC
  Administered 2015-11-03: 30 mmol via INTRAVENOUS
  Filled 2015-11-03: qty 10

## 2015-11-03 NOTE — Progress Notes (Signed)
[facility] Telephone:([phone]   [phone]) [phone]  CONSULT NOTE  REFERRING PHYSICIAN: Dr. Irwin Brakeman  REASON FOR CONSULTATION:  55 years old African-American male with highly suspicious lung cancer.  HPI Keith Murray is a 55 y.o. male with no significant past medical history except for schizoaffective disorder as well as history of polysubstance abuse including smoking, alcohol as well as cocaine abuse. The patient has been doing fine with no specific complaints until a few months ago when he was found to have questionable right lung mass on chest x-ray. I'm not sure if there was a follow-up done for this abnormality but he presented to the emergency Department complaining of a lump on the right lower quadrant of the abdomen. Imaging studies performed during his evaluation including CT scan of the chest on 10/31/2015 showed Massive solid-appearing mass in the right lung apex, consistent with Pancoast tumor, measuring approximately 17 x 12 x 13 cm, centered in the right lung apex but extending peripherally into the overlying anterior and lateral right chest wall. Associated destructive/erosive changes of the right first through fourth ribs. Diffuse ground-glass opacities along the medial and inferior margins of the right upper lobe mass could be postobstructive edema or lymphangitic carcinomatosis. Right pleural effusion, moderate-sized. Conglomerate lymphadenopathy in the mediastinum and right hilum, almost certainly metastatic disease. Additional lymphadenopathy in the right axilla. Ill-defined fluid/edema within the subcutaneous soft tissues of the right lateral chest wall, presumably related to associated ectatic and/or vascular obstruction. CT of the abdomen and pelvis on 11/01/2015 showed Soft tissue mass within the subcutaneous soft tissues of the right abdomen, measuring 2.7 x 2.4 cm, highly suspicious for metastatic soft tissue implant. Hypodense rounded mass  in the left upper quadrant, adjacent to the lateral margin of the left kidney and pancreatic tail, measuring 2.4 x 1.8 x 3 cm. Based on the coronal reconstructions, this is most likely not originating from the kidney or pancreas, suspected metastatic soft tissue implant. Additional hypodense mass/collection just lateral to the lower left iliac bone, measuring 2.9 x 2 cm, of less suspicious appearance than the findings described above but could represent additional metastatic implant. MRI of the brain on 11/01/2015 showed no evidence for metastatic disease to the brain. On 11/02/2015 the patient underwent ultrasound-guided core biopsy of the right abdominal wall mass by interventional radiology. The final pathology is still pending. Dr. Wynetta Emery kindly asked me to see the patient today for evaluation and recommendation regarding his condition. When seen today the patient is feeling fine except for the pain from the subcutaneous abdominal nodules but he has no significant chest pain or shortness of breath. He has mild cough with no hemoptysis. He denied having any significant weight loss or night sweats. He has no nausea, vomiting, diarrhea or constipation. Family history is unremarkable for any malignancy. Social history: The patient is single and has no children. His caregivers are his 2 nieces and a younger sister. 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 He has a long history of smoking more than one pack per day for over 40 years. He also drinks a few beers every day and has a history of drug abuse including cocaine and marijuana but not recently according to him.  HPI  Past Medical History:  Diagnosis Date  . Anxiety   . Polysubstance abuse    cocaine, etoh, marijuana  . Psychosis   . Schizoaffective disorder ([place])  Past Surgical History:  Procedure Laterality Date  . HERNIA REPAIR      Family History  Problem Relation Age of Onset    . Cancer Neg Hx   . Schizophrenia Neg Hx     Social History Social History  Substance Use Topics  . Smoking status: Current Every Day Smoker    Packs/day: 0.50    Years: 4.00  . Smokeless tobacco: Never Used  . Alcohol use No     Comment: reports not drinking any longer    No Known Allergies  Current Facility-Administered Medications  Medication Dose Route Frequency Provider Last Rate Last Dose  . 0.9 %  sodium chloride infusion   Intravenous Continuous [place] Marisa Hua, MD 125 mL/hr at 11/03/15 1248    . acetaminophen (TYLENOL) tablet 650 mg  650 mg Oral Q6H PRN Toy Baker, MD       Or  . acetaminophen (TYLENOL) suppository 650 mg  650 mg Rectal Q6H PRN Toy Baker, MD      . benztropine (COGENTIN) tablet 0.5 mg  0.5 mg Oral BID Toy Baker, MD   0.5 mg at 11/03/15 0923  . FLUoxetine (PROZAC) capsule 20 mg  20 mg Oral Daily Toy Baker, MD   20 mg at 11/03/15 0923  . haloperidol (HALDOL) tablet 5 mg  5 mg Oral BID Clanford Marisa Hua, MD   5 mg at 11/03/15 0923  . HYDROcodone-acetaminophen (NORCO/VICODIN) 5-325 MG per tablet 1-2 tablet  1-2 tablet Oral Q4H PRN Toy Baker, MD      . hydrOXYzine (ATARAX/VISTARIL) tablet 25 mg  25 mg Oral TID PRN Toy Baker, MD      . LORazepam (ATIVAN) injection 2-4 mg  2-4 mg Intravenous Q4H PRN Cl[place]ford Marisa Hua, MD   2 mg at 11/01/15 1607  . nicotine (NICODERM CQ - dosed in mg/24 hours) patch 21 mg  21 mg Transdermal Daily Toy Baker, MD   21 mg at 11/03/15 0922  . ondansetron (ZOFRAN) tablet 4 mg  4 mg Oral Q6H PRN Toy Baker, MD       Or  . ondansetron (ZOFRAN) injection 4 mg  4 mg Intravenous Q6H PRN Toy Baker, MD      . piperacillin-tazobactam (ZOSYN) IVPB 3.375 g  3.375 g Intravenous Q8H Toy Baker, MD   3.375 g at 11/03/15 1303  . sodium chloride flush (NS) 0.9 % injection 3 mL  3 mL Intravenous Q12H Toy Baker, MD      . traZODone (DESYREL) tablet 100  mg  100 mg Oral QHS Toy Baker, MD   100 mg at 11/02/15 2150    Review of Systems  Constitutional: positive for anorexia and fatigue Eyes: negative Ears, nose, mouth, throat, and face: negative Respiratory: positive for cough Cardiovascular: negative Gastrointestinal: negative Genitourinary:negative Integument/breast: negative Hematologic/lymphatic: negative Musculoskeletal:negative Neurological: negative Behavioral/Psych: negative Endocrine: negative Allergic/Immunologic: negative  Physical Exam  NTZ:GYFVC, healthy, no distress, well nourished and well developed SKIN: skin color, texture, turgor are normal, no rashes or significant lesions HEAD: Normocephalic, No masses, lesions, tenderness or abnormalities EYES: normal, PERRLA, Conjunctiva are pink and non-injected EARS: External ears normal, Canals clear OROPHARYNX:no exudate, no erythema and lips, buccal mucosa, and tongue normal  NECK: supple, no adenopathy, no JVD LYMPH:  no palpable lymphadenopathy, no hepatosplenomegaly LUNGS: expiratory wheezes bilaterally HEART: regular rate & rhythm, no murmurs and no gallops ABDOMEN:abdomen soft, non-tender, normal bowel sounds and no masses or organomegaly BACK: Back symmetric, no curvature., No CVA tenderness EXTREMITIES:no joint deformities, effusion,  or inflammation, no edema, no skin discoloration  NEURO: alert & oriented x 3 with fluent speech, no focal motor/sensory deficits  PERFORMANCE STATUS: ECOG 1  LABORATORY DATA: Lab Results  Component Value Date   WBC 39.1 (H) 11/03/2015   HGB 9.0 (L) 11/03/2015   HCT 28.2 (L) 11/03/2015   MCV 86.2 11/03/2015   PLT 434 (H) 11/03/2015    '@LASTCHEM' @  RADIOGRAPHIC STUDIES: Dg Chest 2 View  Result Date: 10/31/2015 CLINICAL DATA:  Right chest pain. EXAM: CHEST  2 VIEW COMPARISON:  Jul 27, 2015 FINDINGS: The mass in the right upper chest is significantly larger in the interval measuring up to 15 cm in cranial caudal  dimension today versus 10.6 cm previously. There is associated erosion of multiple ribs which was not seen previously in the adjacent ribs. The heart, hila, mediastinum, left lung, and lower right lung are otherwise unchanged. IMPRESSION: Enlarging right upper lobe pulmonary mass which may be pleural based. There is erosion of multiple right-sided ribs which was not seen previously. A chest CT could better evaluate. Electronically Signed   By: Dorise Bullion III M.D   On: 10/31/2015 13:34   Ct Chest W Contrast  Result Date: 10/31/2015 CLINICAL DATA:  Evaluate large right upper lobe mass. EXAM: CT CHEST WITH CONTRAST TECHNIQUE: Multidetector CT imaging of the chest was performed during intravenous contrast administration. CONTRAST:  7m ISOVUE-300 IOPAMIDOL (ISOVUE-300) INJECTION 61% COMPARISON:  Plain film of the chest from earlier today. FINDINGS: Cardiovascular: Thoracic aorta is normal in caliber and configuration. Heart size is normal. No pericardial effusion. Coronary artery calcifications noted. Mediastinum/Nodes: Conglomerate lymphadenopathy in the right hilum, measuring 3.8 x 2.4 cm. Additional conglomerate lymphadenopathy in the right lower peritracheal space, measuring up to 1.4 cm short axis dimension. Scattered smaller lymph nodes within the upper peritracheal and aortopulmonary window regions. Numerous enlarged and morphologically abnormal lymph nodes are seen within the right axilla. Lungs/Pleura: Massive solid-appearing mass in the right lung apex, measuring approximately 17 x 12 x 13 cm, centered in the right lung apex but extending into the overlying anterior and lateral right chest wall. There are destructive/erosive changes of the right first through fourth ribs. Diffuse ground-glass opacities along the medial and inferior margins of the right upper lobe mass could be edema or lymphangitic carcinomatosis. There is a moderate-sized right pleural effusion. Left lung is clear. Upper Abdomen:  Limited images of the upper abdomen are unremarkable. Musculoskeletal: Destructive changes of the right first through fourth ribs by the right upper lobe mass. No distant osseous metastases seen. As above, the right upper lobe mass extends outward into the anterior and lateral right chest wall. Ill-defined fluid/edema is seen within the lower portions of the right chest wall, likely related to associated lymphatic and/or vascular obstruction. IMPRESSION: 1. Massive solid-appearing mass in the right lung apex, consistent with Pancoast tumor, measuring approximately 17 x 12 x 13 cm, centered in the right lung apex but extending peripherally into the overlying anterior and lateral right chest wall. Associated destructive/erosive changes of the right first through fourth ribs. 2. Diffuse ground-glass opacities along the medial and inferior margins of the right upper lobe mass could be postobstructive edema or lymphangitic carcinomatosis. 3. Right pleural effusion, moderate-sized. 4. Conglomerate lymphadenopathy in the mediastinum and right hilum, almost certainly metastatic disease. Additional lymphadenopathy in the right axilla. 5. Ill-defined fluid/edema within the subcutaneous soft tissues of the right lateral chest wall, presumably related to associated ectatic and/or vascular obstruction. Electronically Signed   By: SCherlynn Kaiser  Enriqueta Shutter M.D.   On: 10/31/2015 17:31   Mr Jeri Cos JJ Contrast  Result Date: 11/01/2015 CLINICAL DATA:  Lung cancer.  Staging. EXAM: MRI HEAD WITHOUT AND WITH CONTRAST TECHNIQUE: Multiplanar, multiecho pulse sequences of the brain and surrounding structures were obtained without and with intravenous contrast. CONTRAST:  98m MULTIHANCE GADOBENATE DIMEGLUMINE 529 MG/ML IV SOLN COMPARISON:  None. FINDINGS: Motion degraded exam, primarily affecting postcontrast sequences. Calvarium and upper cervical spine: Hypo intense appearance of cervical spine marrow with patchy hypo intense appearance of the  clivus. These areas are homogeneously hypo enhancing. Orbits: Negative. Sinuses and Mastoids: Clear. Brain: No abnormal enhancement or edema to suggest intracranial metastasis. Mild white matter disease for age with nonspecific pattern of scattered FLAIR hyperintensities, usually chronic microvascular ischemia. Normal brain volume. No acute or remote infarct, hemorrhage, hydrocephalus, or major vessel occlusion. IMPRESSION: 1. Negative for intracranial metastatic disease. 2. Abnormal marrow which could be from anemia or metastatic disease. Electronically Signed   By: JMonte FantasiaM.D.   On: 11/01/2015 18:54   Ct Abdomen Pelvis W Contrast  Result Date: 11/01/2015 CLINICAL DATA:  Elevated white count and elevated calcium in the. Chest x-ray at that time showed possible right upper lung mass. Today presenting with right chest pain intermittent for months. Weight loss since May AP EXAM: CT ABDOMEN AND PELVIS WITH CONTRAST TECHNIQUE: Multidetector CT imaging of the abdomen and pelvis was performed using the standard protocol following bolus administration of intravenous contrast. CONTRAST:  1070mISOVUE-300 IOPAMIDOL (ISOVUE-300) INJECTION 61% COMPARISON:  Chest CT dated 10/31/2015 and CT abdomen dated 07/15/2010. FINDINGS: Lower chest: Pleural effusion at the right lung base, consistent with the moderate to large pleural effusion appreciated on the earlier chest CT. Hepatobiliary: No mass or focal lesion identified within the liver. Gallbladder appears normal. Pancreas: No mass, inflammatory changes, or other significant abnormality. Spleen: Within normal limits in size and appearance. Adrenals/Urinary Tract: Adrenal glands appear normal. Punctate nonobstructing left renal stone. No associated hydronephrosis. Right kidney appears normal without stone or hydronephrosis. Bladder is unremarkable. Stomach/Bowel: Bowel is normal in caliber. Fairly large amount of stool and gas throughout the colon. Stomach appears  normal. Vascular/Lymphatic: Abdominal aorta is normal in caliber. No enlarged lymph nodes seen. Reproductive: No mass or other significant abnormality. Other: Hypodense mass is seen within the left upper quadrant, measuring 2.4 x 1.8 x 3 cm, located adjacent to the lateral margin of the left kidney and adjacent to the pancreatic tail. Based on the coronal reconstructions, this mass does not appear to be originating from the kidney or pancreas. Suspect metastatic soft tissue implant. There is a soft tissue mass within the subcutaneous soft tissues of the right abdomen, just below the level of the liver, measuring 2.7 x 2.4 cm, highly suspicious for metastatic soft tissue implant. Small amount of free fluid in the lower pelvis. No circumscribed abscess collection identified. No free intraperitoneal air. Within the musculature immediately lateral to the lower left iliac bone, there is a hypodense mass/collection measuring 2.9 x 2 cm, of uncertain etiology or chronicity. Ill-defined edema is present throughout the remainder of the subcutaneous soft tissues suggesting anasarca. Musculoskeletal: Degenerative changes within the spine and at the hips, mild to moderate in degree. No acute or suspicious osseous finding. Probable AVN at the left femoral head. IMPRESSION: 1. Right pleural effusion, as seen on yesterday's chest CT. Almost certainly malignant effusion given the large Pancoast tumor identified on chest CT. 2. Soft tissue mass within the subcutaneous soft tissues of the  right abdomen, measuring 2.7 x 2.4 cm, highly suspicious for metastatic soft tissue implant. 3. Hypodense rounded mass in the left upper quadrant, adjacent to the lateral margin of the left kidney and pancreatic tail, measuring 2.4 x 1.8 x 3 cm. Based on the coronal reconstructions, this is most likely not originating from the kidney or pancreas, suspected metastatic soft tissue implant. 4. Additional hypodense mass/collection just lateral to the  lower left iliac bone, measuring 2.9 x 2 cm, of less suspicious appearance than the findings described above but could represent additional metastatic implant. 5. Small amount of free fluid in the lower pelvis.  Anasarca. 6. Fairly large amount of stool and gas throughout the colon (constipation? ). No evidence of bowel obstruction. 7. No evidence of osseous metastasis. Probable chronic AVN of the left femoral head. Electronically Signed   By: Franki Cabot M.D.   On: 11/01/2015 17:54   US Biopsy  Result Date: 11/02/2015 CLINICAL DATA:  Large necrotic mass at the right lung apex invading the chest wall and axilla. Imaging has also demonstrated additional soft tissue mass in the right abdominal wall likely representing a metastatic lesion. The patient presents for biopsy of the abdominal wall mass. EXAM: ULTRASOUND GUIDED CORE BIOPSY OF RIGHT ABDOMINAL WALL MASS MEDICATIONS: 1.5 mg IV Versed; 75 mcg IV Fentanyl Total Moderate Sedation Time: 12 minutes. The patient's level of consciousness and physiologic status were continuously monitored during the procedure by Radiology nursing. PROCEDURE: The procedure, risks, benefits, and alternatives were explained to the patient. Questions regarding the procedure were encouraged and answered. The patient understands and consents to the procedure. A time-out was performed prior to initiating the procedure. The right abdominal wall was prepped with chlorhexidine in a sterile fashion, and a sterile drape was applied covering the operative field. A sterile gown and sterile gloves were used for the procedure. Local anesthesia was provided with 1% Lidocaine. Ultrasound was used to localize a right abdominal wall mass. An 18 gauge core biopsy device was utilized in obtaining 4 core biopsy samples through different portions of the mass. Material was submitted in formalin. COMPLICATIONS: None. FINDINGS: Solid and heterogeneous soft tissue mass of the superficial right abdominal wall  measures up to approximately 3.5 cm in maximum diameter by ultrasound. Solid tissue was obtained. IMPRESSION: Ultrasound-guided core biopsy performed of a a right abdominal wall mass. Electronically Signed   By: Aletta Edouard M.D.   On: 11/02/2015 17:06    ASSESSMENT: This is a very pleasant 55 years old African-American male with highly suspicious of stage IV (T3, N2, M1b) lung cancer, pending tissue diagnosis, presented with massive right upper lobe lung mass in addition to mediastinal lymphadenopathy and subcutaneous metastatic disease. Interestingly the patient is not symptomatic except from the subcutaneous nodule.   PLAN: I had a lengthy discussion with the patient today about his current disease stage, prognosis and treatment options. I will wait for the final pathology before giving any further recommendation regarding treatment of his condition. I also recommended for the patient to see radiation oncology for consideration of palliative radiotherapy to the painful subcutaneous nodules as well as the right upper lobe lung mass. If the tissue diagnosis is consistent with non-small cell carcinoma, we will send the tissue block to be tested for medical or studies including PDL-1. For the hypercalcemia, continue IV hydration. I may consider the patient treatment with Zometa in the future if he continues to have significant hypercalcemia. I will arrange for the patient in follow-up appointment with  me at the [facility] after discharge for more detailed discussion of his treatment options. The patient voices understanding of current disease status and treatment options and is in agreement with the current care plan.  All questions were answered. The patient knows to call the clinic with any problems, questions or concerns. We can certainly see the patient much sooner if necessary.  Thank you so much for allowing me to participate in the care of [facility]. I will continue to  follow up the patient with you and assist in his care.  Disclaimer: This note was dictated with voice recognition software. Similar sounding words can inadvertently be transcribed and may not be corrected upon review.   Kinley Ferrentino K. November 03, 2015, 5:11 PM

## 2015-11-03 NOTE — Progress Notes (Signed)
PROGRESS NOTE    Keith Murray  QMG:[id number]  DOB: July 02, 1960  DOA: 10/31/2015 PCP: Grant Fontana, MD (Inactive) Outpatient Specialists:  Hospital course: Keith Murray is a 55 y.o. male with medical history significant of cocaine and alcohol abuse schizoaffective disorder and bipolar disorder.  Patient was found to have elevated white blood cell count and calcium in May he had chest x-ray done that showed possible right upper lung mass today he presents to obtain CT scan.  He endorses having right chest pain has been going on for months feels sharp and not associated with cough and fever nausea vomiting or abdominal pain. She leaves to his niece [name]. Per family he was diagnosed with lung mass in May but never followed up he's been losing weight.  Review of records in May his calcium was already elevated at 11.5 white blood cell count was up to 22.5 also in May in January white blood cell, was elevated 12.0 predominantly neutrophils  Assessment & Plan:   1. Hypercalcemia of malignancy (suspected) - improving with aggressive therapy with calcitonin and pamidronate, IV normal saline.  Continue telemetry.  Repeat in AM.   2. Lung Mass - right, Pt consented for tissue biopsy by IR.  Done on 8/7.  They will biopsy the abdominal mass which is likely metastatic spread.  Plan to consult oncologist 8/8 - done.   3. ?Postobstructive Pneumonia - Continue IV Zosyn for now.    4. Abdominal Mass - metastatic spread, core biopsy done 8/7.     5. Leukocytosis - Trending down slightly.   6. Schizoaffective Disorder - behavioral health consult requested.   7. History of polysubstance abuse - urine toxicology screen negative.    DVT prophylaxis: SCDs Code Status: FULL Family Communication: Met with nieces  [phone] [name] patient also provided phone number of his other niece on the phone [phone]  Spoke with [name] on phone 8/7.  Disposition Plan: TBD  Consultants: [facility], pharmacy, order for IR cosult  Procedures:  Biopsy 11/02/15  Antimicrobials: Anti-infectives    Start     Dose/Rate Route Frequency Ordered Stop   10/31/15 2100  piperacillin-tazobactam (ZOSYN) IVPB 3.375 g     3.375 g 12.5 mL/hr over 240 Minutes Intravenous Every 8 hours 10/31/15 2051        Subjective: Pt without complaints.    Objective: Vitals:   11/02/15 1355 11/02/15 1418 11/02/15 2053 11/03/15 0430  BP: 104/66 (!) 120/97 109/65 111/70  Pulse: 93 92 95 89  Resp: (!) 26 (!) 24 (!) 22 20  Temp:  98.4 F (36.9 C) 98.4 F (36.9 C) 97.9 F (36.6 C)  TempSrc:  Oral Oral Oral  SpO2: 93% 100% 100% 99%  Weight:      Height:        Intake/Output Summary (Last 24 hours) at 11/03/15 1147 Last data filed at 11/02/15 1500  Gross per 24 hour  Intake          1010.42 ml  Output                0 ml  Net          1010.42 ml   Filed Weights   10/31/15 1240 10/31/15 2053  Weight: 83 kg (183 lb) 83 kg (182 lb 15.7 oz)   Exam:  General exam: emaciated chronically ill appearing male, no apparent distress Respiratory system: diminished BS RUL RML Cardiovascular system: S1 & S2 Busk, RRR. No JVD, murmurs, gallops, clicks  or pedal edema. Gastrointestinal system: Abdomen is nondistended, soft and nontender. Firm Mass over RLQ appears to be subcutaneous, Normal bowel sounds Schroeck. Central nervous system: Alert and oriented. No focal neurological deficits. Extremities: severe clubbing noted in fingers bilateral hands.  Data Reviewed: Basic Metabolic Panel:  Recent Labs Lab 10/31/15 1249 10/31/15 2350 11/01/15 0531 11/02/15 0512 11/03/15 0354  NA 131*  --  135 136 136  K 3.4*  --  3.7 3.9 4.1  CL 103  --  110 113* 111  CO2 21*  --  21* 18* 19*  GLUCOSE 127*  --  95 85 100*  BUN 14  --  _0 CREATININE 1.17  --  1.06 1.11 1.09  CALCIUM 13.2* 11.7* 11.0* 10.7* 10.4*  MG  --   --  1.8  --   --   PHOS  --   --  1.6*  --  1.4*   Liver Function  Tests:  Recent Labs Lab 10/31/15 1249 11/01/15 0531 11/02/15 0512 11/03/15 0354  AST 13* 11* 9* 10*  ALT 10* 7* 7* 6*  ALKPHOS 201* 172* 158* 172*  BILITOT 0.4 0.6 0.3 0.6  PROT 7.3 6.1* 6.1* 6.3*  ALBUMIN 2.9* 2.6* 2.5* 2.6*    Recent Labs Lab 10/31/15 1249  LIPASE 16   No results for input(s): AMMONIA in the last 168 hours. CBC:  Recent Labs Lab 10/31/15 1249 10/31/15 1906 11/01/15 0531 11/02/15 0512 11/03/15 0354  WBC 40.7* 45.5* 40.0* 39.4* 39.1*  NEUTROABS  --  41.4*  --  35.8* 35.5*  HGB 10.4* 9.9* 8.8* 9.2* 9.0*  HCT 31.7* 29.6* 27.1* 28.3* 28.2*  MCV 86.6 85.5 86.0 86.8 86.2  PLT 391 438* 405* 424* 434*   Cardiac Enzymes: No results for input(s): CKTOTAL, CKMB, CKMBINDEX, TROPONINI in the last 168 hours. CBG (last 3)  No results for input(s): GLUCAP in the last 72 hours. No results found for this or any previous visit (from the past 240 hour(s)).   Studies: Mr Jeri Cos RA Contrast  Result Date: 11/01/2015 CLINICAL DATA:  Lung cancer.  Staging. EXAM: MRI HEAD WITHOUT AND WITH CONTRAST TECHNIQUE: Multiplanar, multiecho pulse sequences of the brain and surrounding structures were obtained without and with intravenous contrast. CONTRAST:  71m MULTIHANCE GADOBENATE DIMEGLUMINE 529 MG/ML IV SOLN COMPARISON:  None. FINDINGS: Motion degraded exam, primarily affecting postcontrast sequences. Calvarium and upper cervical spine: Hypo intense appearance of cervical spine marrow with patchy hypo intense appearance of the clivus. These areas are homogeneously hypo enhancing. Orbits: Negative. Sinuses and Mastoids: Clear. Brain: No abnormal enhancement or edema to suggest intracranial metastasis. Mild white matter disease for age with nonspecific pattern of scattered FLAIR hyperintensities, usually chronic microvascular ischemia. Normal brain volume. No acute or remote infarct, hemorrhage, hydrocephalus, or major vessel occlusion. IMPRESSION: 1. Negative for intracranial  metastatic disease. 2. Abnormal marrow which could be from anemia or metastatic disease. Electronically Signed   By: JMonte FantasiaM.D.   On: 11/01/2015 18:54   Ct Abdomen Pelvis W Contrast  Result Date: 11/01/2015 CLINICAL DATA:  Elevated white count and elevated calcium in the. Chest x-ray at that time showed possible right upper lung mass. Today presenting with right chest pain intermittent for months. Weight loss since May AP EXAM: CT ABDOMEN AND PELVIS WITH CONTRAST TECHNIQUE: Multidetector CT imaging of the abdomen and pelvis was performed using the standard protocol following bolus administration of intravenous contrast. CONTRAST:  1012mISOVUE-300 IOPAMIDOL (ISOVUE-300) INJECTION 61% COMPARISON:  Chest CT  dated 10/31/2015 and CT abdomen dated 07/15/2010. FINDINGS: Lower chest: Pleural effusion at the right lung base, consistent with the moderate to large pleural effusion appreciated on the earlier chest CT. Hepatobiliary: No mass or focal lesion identified within the liver. Gallbladder appears normal. Pancreas: No mass, inflammatory changes, or other significant abnormality. Spleen: Within normal limits in size and appearance. Adrenals/Urinary Tract: Adrenal glands appear normal. Punctate nonobstructing left renal stone. No associated hydronephrosis. Right kidney appears normal without stone or hydronephrosis. Bladder is unremarkable. Stomach/Bowel: Bowel is normal in caliber. Fairly large amount of stool and gas throughout the colon. Stomach appears normal. Vascular/Lymphatic: Abdominal aorta is normal in caliber. No enlarged lymph nodes seen. Reproductive: No mass or other significant abnormality. Other: Hypodense mass is seen within the left upper quadrant, measuring 2.4 x 1.8 x 3 cm, located adjacent to the lateral margin of the left kidney and adjacent to the pancreatic tail. Based on the coronal reconstructions, this mass does not appear to be originating from the kidney or pancreas. Suspect  metastatic soft tissue implant. There is a soft tissue mass within the subcutaneous soft tissues of the right abdomen, just below the level of the liver, measuring 2.7 x 2.4 cm, highly suspicious for metastatic soft tissue implant. Small amount of free fluid in the lower pelvis. No circumscribed abscess collection identified. No free intraperitoneal air. Within the musculature immediately lateral to the lower left iliac bone, there is a hypodense mass/collection measuring 2.9 x 2 cm, of uncertain etiology or chronicity. Ill-defined edema is present throughout the remainder of the subcutaneous soft tissues suggesting anasarca. Musculoskeletal: Degenerative changes within the spine and at the hips, mild to moderate in degree. No acute or suspicious osseous finding. Probable AVN at the left femoral head. IMPRESSION: 1. Right pleural effusion, as seen on yesterday's chest CT. Almost certainly malignant effusion given the large Pancoast tumor identified on chest CT. 2. Soft tissue mass within the subcutaneous soft tissues of the right abdomen, measuring 2.7 x 2.4 cm, highly suspicious for metastatic soft tissue implant. 3. Hypodense rounded mass in the left upper quadrant, adjacent to the lateral margin of the left kidney and pancreatic tail, measuring 2.4 x 1.8 x 3 cm. Based on the coronal reconstructions, this is most likely not originating from the kidney or pancreas, suspected metastatic soft tissue implant. 4. Additional hypodense mass/collection just lateral to the lower left iliac bone, measuring 2.9 x 2 cm, of less suspicious appearance than the findings described above but could represent additional metastatic implant. 5. Small amount of free fluid in the lower pelvis.  Anasarca. 6. Fairly large amount of stool and gas throughout the colon (constipation? ). No evidence of bowel obstruction. 7. No evidence of osseous metastasis. Probable chronic AVN of the left femoral head. Electronically Signed   By: Franki Cabot M.D.   On: 11/01/2015 17:54   US Biopsy  Result Date: 11/02/2015 CLINICAL DATA:  Large necrotic mass at the right lung apex invading the chest wall and axilla. Imaging has also demonstrated additional soft tissue mass in the right abdominal wall likely representing a metastatic lesion. The patient presents for biopsy of the abdominal wall mass. EXAM: ULTRASOUND GUIDED CORE BIOPSY OF RIGHT ABDOMINAL WALL MASS MEDICATIONS: 1.5 mg IV Versed; 75 mcg IV Fentanyl Total Moderate Sedation Time: 12 minutes. The patient's level of consciousness and physiologic status were continuously monitored during the procedure by Radiology nursing. PROCEDURE: The procedure, risks, benefits, and alternatives were explained to the patient. Questions regarding the  procedure were encouraged and answered. The patient understands and consents to the procedure. A time-out was performed prior to initiating the procedure. The right abdominal wall was prepped with chlorhexidine in a sterile fashion, and a sterile drape was applied covering the operative field. A sterile gown and sterile gloves were used for the procedure. Local anesthesia was provided with 1% Lidocaine. Ultrasound was used to localize a right abdominal wall mass. An 18 gauge core biopsy device was utilized in obtaining 4 core biopsy samples through different portions of the mass. Material was submitted in formalin. COMPLICATIONS: None. FINDINGS: Solid and heterogeneous soft tissue mass of the superficial right abdominal wall measures up to approximately 3.5 cm in maximum diameter by ultrasound. Solid tissue was obtained. IMPRESSION: Ultrasound-guided core biopsy performed of a a right abdominal wall mass. Electronically Signed   By: Aletta Edouard M.D.   On: 11/02/2015 17:06   Scheduled Meds: . benztropine  0.5 mg Oral BID  . FLUoxetine  20 mg Oral Daily  . haloperidol  5 mg Oral BID  . nicotine  21 mg Transdermal Daily  . piperacillin-tazobactam (ZOSYN)  IV   3.375 g Intravenous Q8H  . sodium chloride flush  3 mL Intravenous Q12H  . sodium phosphate  Dextrose 5% IVPB  30 mmol Intravenous Once  . traZODone  100 mg Oral QHS   Continuous Infusions: . sodium chloride 125 mL/hr at 11/02/15 0500   Principal Problem:   Schizoaffective disorder, bipolar type (HCC) Active Problems:   Polysubstance (excluding opioids) dependence (HCC)   Hypercalcemia of malignancy   Lung mass   Postobstructive pneumonia   Hypercalcemia   Leukocytosis  Time spent:   Irwin Brakeman, MD, FAAFP [facility] Pager [phone]  If 7PM-7AM, please contact night-coverage [email or web address] Password [email or web address] 11/03/2015, 11:47 AM    LOS: 3 days

## 2015-11-04 ENCOUNTER — Ambulatory Visit
Admit: 2015-11-04 | Discharge: 2015-11-04 | Disposition: A | Discharge status: Home / Self Care | Payer: Medicare Other | Source: Ambulatory Visit | Attending: Radiation Oncology | Admitting: Radiation Oncology

## 2015-11-04 ENCOUNTER — Inpatient Hospital Stay (HOSPITAL_COMMUNITY): Payer: Medicare Other

## 2015-11-04 DIAGNOSIS — Z79899 Other long term (current) drug therapy: Secondary | ICD-10-CM | POA: Insufficient documentation

## 2015-11-04 DIAGNOSIS — IMO0001 Reserved for inherently not codable concepts without codable children: Secondary | ICD-10-CM

## 2015-11-04 DIAGNOSIS — C7989 Secondary malignant neoplasm of other specified sites: Secondary | ICD-10-CM | POA: Insufficient documentation

## 2015-11-04 DIAGNOSIS — C3411 Malignant neoplasm of upper lobe, right bronchus or lung: Secondary | ICD-10-CM | POA: Insufficient documentation

## 2015-11-04 DIAGNOSIS — Z510 Encounter for antineoplastic radiation therapy: Secondary | ICD-10-CM | POA: Insufficient documentation

## 2015-11-04 DIAGNOSIS — C3491 Malignant neoplasm of unspecified part of right bronchus or lung: Secondary | ICD-10-CM

## 2015-11-04 DIAGNOSIS — G893 Neoplasm related pain (acute) (chronic): Secondary | ICD-10-CM

## 2015-11-04 LAB — BASIC METABOLIC PANEL
Anion gap: 5 (ref 5–15)
BUN: 10 mg/dL (ref 6–20)
CO2: 17 mmol/L — ABNORMAL LOW (ref 22–32)
Calcium: 10.6 mg/dL — ABNORMAL HIGH (ref 8.9–10.3)
Chloride: 110 mmol/L (ref 101–111)
Creatinine, Ser: 1.1 mg/dL (ref 0.61–1.24)
GFR calc Af Amer: >60 mL/min (ref >60)
GFR calc non Af Amer: >60 mL/min (ref >60)
Glucose, Bld: 105 mg/dL — ABNORMAL HIGH (ref 65–99)
Potassium: 4.7 mmol/L (ref 3.5–5.1)
Sodium: 132 mmol/L — ABNORMAL LOW (ref 135–145)

## 2015-11-04 LAB — PHOSPHORUS: Phosphorus: 1.8 mg/dL — ABNORMAL LOW (ref 2.5–4.6)

## 2015-11-04 MED ORDER — SODIUM CHLORIDE 0.9 % IV SOLN
INTRAVENOUS | Status: DC
  Administered 2015-11-04 – 2015-11-06 (×7): via INTRAVENOUS

## 2015-11-04 MED ORDER — SODIUM PHOSPHATES 45 MMOLE/15ML IV SOLN
20.0000 mmol | Freq: Once | INTRAVENOUS | Status: AC
  Administered 2015-11-04: 20 mmol via INTRAVENOUS
  Filled 2015-11-04: qty 6.67

## 2015-11-04 MED ORDER — ALBUTEROL SULFATE (2.5 MG/3ML) 0.083% IN NEBU
2.5000 mg | INHALATION_SOLUTION | Freq: Four times a day (QID) | RESPIRATORY_TRACT | Status: DC | PRN
  Administered 2015-11-05 – 2015-11-07 (×6): 2.5 mg via RESPIRATORY_TRACT
  Filled 2015-11-04 (×6): qty 3

## 2015-11-04 MED ORDER — KETOROLAC TROMETHAMINE 30 MG/ML IJ SOLN
30.0000 mg | Freq: Four times a day (QID) | INTRAMUSCULAR | Status: DC
  Administered 2015-11-04 – 2015-11-07 (×13): 30 mg via INTRAVENOUS
  Filled 2015-11-04 (×13): qty 1

## 2015-11-04 MED ORDER — ALBUTEROL SULFATE (2.5 MG/3ML) 0.083% IN NEBU
2.5000 mg | INHALATION_SOLUTION | Freq: Once | RESPIRATORY_TRACT | Status: AC
  Administered 2015-11-04: 2.5 mg via RESPIRATORY_TRACT
  Filled 2015-11-04: qty 3

## 2015-11-04 MED ORDER — LEVOFLOXACIN 750 MG PO TABS
750.0000 mg | ORAL_TABLET | Freq: Every day | ORAL | Status: DC
  Administered 2015-11-04 – 2015-11-07 (×4): 750 mg via ORAL
  Filled 2015-11-04 (×4): qty 1

## 2015-11-04 NOTE — Care Management Important Message (Signed)
Important Message  Patient Details  Name: Hersh Minney MRN: [id number] Date of Birth: 07/26/1960   Medicare Important Message Given:  Yes    Camillo Flaming 11/04/2015, 10:18 AMImportant Message  Patient Details  Name: Jacori Mulrooney MRN: [id number] Date of Birth: 04-10-1960   Medicare Important Message Given:  Yes    Camillo Flaming 11/04/2015, 10:18 AM

## 2015-11-04 NOTE — Evaluation (Signed)
Physical Therapy One Time Evaluation Patient Details Name: Keith Murray MRN: [id number] DOB: Aug 15, 1960 Today's Date: 11/04/2015   History of Present Illness  55 y.o. male with medical history significant of cocaine and alcohol abuse schizoaffective disorder and bipolar disorder and admitted for Hypercalcemia of malignancy (suspected) and lung and abdominal mass  Clinical Impression  Patient evaluated by Physical Therapy with no further acute PT needs identified. All education has been completed and the patient has no further questions.  Pt mobilizing well, denies any symptoms with mobility. See below for any follow-up Physical Therapy or equipment needs. PT is signing off. Thank you for this referral.     Follow Up Recommendations No PT follow up    Equipment Recommendations  None recommended by PT    Recommendations for Other Services       Precautions / Restrictions Precautions Precautions: None      Mobility  Bed Mobility Overal bed mobility: Modified Independent                Transfers Overall transfer level: Modified independent                  Ambulation/Gait Ambulation/Gait assistance: Supervision;Modified independent (Device/Increase time) Ambulation Distance (Feet): 800 Feet Assistive device: None Gait Pattern/deviations: WFL(Within Functional Limits)     General Gait Details: no unsteadiness or LOB observed, Spo2 99% room air upon returning to room  Stairs            Wheelchair Mobility    Modified Rankin (Stroke Patients Only)       Balance Overall balance assessment: No apparent balance deficits (not formally assessed)                                           Pertinent Vitals/Pain Pain Assessment: No/denies pain    Home Living Family/patient expects to be discharged to:: Private residence Living Arrangements: Alone             Home Equipment: None      Prior Function Level of Independence:  Independent               Hand Dominance        Extremity/Trunk Assessment   Upper Extremity Assessment: Overall WFL for tasks assessed           Lower Extremity Assessment: Overall WFL for tasks assessed         Communication   Communication: No difficulties  Cognition Arousal/Alertness: Awake/alert Behavior During Therapy: Flat affect Overall Cognitive Status: Within Functional Limits for tasks assessed                      General Comments      Exercises        Assessment/Plan    PT Assessment Patent does not need any further PT services  PT Diagnosis Difficulty walking   PT Problem List    PT Treatment Interventions     PT Goals (Current goals can be found in the Care Plan section) Acute Rehab PT Goals PT Goal Formulation: All assessment and education complete, DC therapy    Frequency     Barriers to discharge        Co-evaluation               End of Session   Activity Tolerance: Patient tolerated treatment well Patient left: in  chair;with call bell/phone within reach;with family/visitor present           Time: 4580-9983 PT Time Calculation (min) (ACUTE ONLY): 11 min   Charges:   PT Evaluation $PT Eval Low Complexity: 1 Procedure     PT G Codes:        Keith Murray,Keith Murray 11/04/2015, 12:31 PM Keith Murray, PT, DPT 11/04/2015 Pager: [phone]

## 2015-11-04 NOTE — Progress Notes (Signed)
Progress Note    Keith Murray  OIB:[id number] DOB: Jun 09, 1960  DOA: 10/31/2015 PCP: Grant Fontana, MD (Inactive)    Brief Narrative:   Keith Murray is an 54 y.o. male with a PMH of cocaine/alcohol abuse, schizoaffective/bipolar disorder who was admitted 10/31/15 for evaluation of a right upper lung mass, elevated WBC and calcium and right chest pain. He was diagnosed with the mass back in May, but was lost to follow-up.  Assessment/Plan:   Principal Problem:   Postobstructive pneumoniaIn the setting of a lung mass highly suspicious for primary lung carcinoma/Leukocytosis Status post biopsy 11/02/15. Pathology report pending. Continue Zosyn (day #4). Narrow to Levaquin. Dr. Julien Nordmann has been consulted and is awaiting final tissue diagnosis before providing further recommendations. Dr. Julien Nordmann will arrange for outpatient follow-up. Given the patient's reports of inadequate pain control, will add Toradol 30 mg IV every 6 hours.      Hypercalcemia of malignancy Continue IV fluids. May need Zometa.  Active Problems:   Hypophosphatemia We'll give additional phosphorus supplementation today and recheck phosphorus level in the morning.    Polysubstance (excluding opioids) dependence ([facility]) Urine toxicology screen was negative on admission. Avoid IV narcotics.    Schizoaffective disorder, bipolar type (HCC) Continue Prozac, Haldol and Cogentin. Continue Vistaril as needed for anxiety.   Family Communication/Anticipated D/C date and plan/Code Status   DVT prophylaxis: Lovenox ordered. Code Status: Full Code.  Family Communication: No family currently at the bedside. Disposition Plan: Home in 24-48 hours depending on stability of electrolytes.   Medical Consultants:    Oncology  Interventional Radiology   Procedures:    Anti-Infectives:   Zosyn 10/31/15 ---> 11/04/15 Levaquin 11/04/15--->   Subjective:    Keith Murray reports sharp abdominal pain, pain at biopsy site,  doesn't feel well managed but RN reports that he has not been taking pain meds when offered.  No constipation.  No nausea or vomiting.  Reports his appetite is OK. He is very irritable when I ask him questions.  Objective:    Vitals:   11/03/15 0430 11/03/15 1304 11/03/15 2150 11/04/15 0615  BP: 111/70 (!) 99/55 111/67 (!) 96/44  Pulse: 89 88 97 89  Resp: '20 18 18 20  '$ Temp: 97.9 F (36.6 C) 98.1 F (36.7 C) 98.9 F (37.2 C) 98.8 F (37.1 C)  TempSrc: Oral Oral Oral Oral  SpO2: 99% 97% 100% 94%  Weight:      Height:        Intake/Output Summary (Last 24 hours) at 11/04/15 0832 Last data filed at 11/04/15 0100  Gross per 24 hour  Intake             4920 ml  Output                0 ml  Net             4920 ml   Filed Weights   10/31/15 1240 10/31/15 2053  Weight: 83 kg (183 lb) 83 kg (182 lb 15.7 oz)    Exam: General exam: Appears calm and comfortable. Respiratory system: Clear to auscultation, But diminished. Respiratory effort normal. Cardiovascular system: S1 & S2 [place], RRR. No JVD,  rubs, gallops or clicks. No murmurs. Gastrointestinal system: Abdomen is nondistended, soft and nontender. No organomegaly or masses felt. Normal bowel sounds [place]. Central nervous system: Alert and oriented. No focal neurological deficits. Extremities: No clubbing,  or cyanosis. No edema. Skin: No rashes, lesions or ulcers. Psychiatry: Judgement and insight appear  diminished. Mood & affect irritable.   Data Reviewed:   I have personally reviewed following labs and imaging studies:  Labs: Basic Metabolic Panel:  Recent Labs Lab 10/31/15 1249 10/31/15 2350 11/01/15 0531 11/02/15 0512 11/03/15 0354  NA 131*  --  135 136 136  K 3.4*  --  3.7 3.9 4.1  CL 103  --  110 113* 111  CO2 21*  --  21* 18* 19*  GLUCOSE 127*  --  95 85 100*  BUN 14  --  '13 11 10  '$ CREATININE 1.17  --  1.06 1.11 1.09  CALCIUM 13.2* 11.7* 11.0* 10.7* 10.4*  MG  --   --  1.8  --   --   PHOS  --   --   1.6*  --  1.4*   GFR Estimated Creatinine Clearance: 90.1 mL/min (by C-G formula based on SCr of 1.09 mg/dL). Liver Function Tests:  Recent Labs Lab 10/31/15 1249 11/01/15 0531 11/02/15 0512 11/03/15 0354  AST 13* 11* 9* 10*  ALT 10* 7* 7* 6*  ALKPHOS 201* 172* 158* 172*  BILITOT 0.4 0.6 0.3 0.6  PROT 7.3 6.1* 6.1* 6.3*  ALBUMIN 2.9* 2.6* 2.5* 2.6*    Recent Labs Lab 10/31/15 1249  LIPASE 16   Coagulation profile  Recent Labs Lab 11/01/15 0531  INR 1.13    CBC:  Recent Labs Lab 10/31/15 1249 10/31/15 1906 11/01/15 0531 11/02/15 0512 11/03/15 0354  WBC 40.7* 45.5* 40.0* 39.4* 39.1*  NEUTROABS  --  41.4*  --  35.8* 35.5*  HGB 10.4* 9.9* 8.8* 9.2* 9.0*  HCT 31.7* 29.6* 27.1* 28.3* 28.2*  MCV 86.6 85.5 86.0 86.8 86.2  PLT 391 438* 405* 424* 434*   Urine analysis:    Component Value Date/Time   COLORURINE YELLOW 10/31/2015 [facility] 10/31/2015 1243   LABSPEC 1.013 10/31/2015 1243   PHURINE 6.0 10/31/2015 1243   GLUCOSEU NEGATIVE 10/31/2015 1243   HGBUR NEGATIVE 10/31/2015 1243   BILIRUBINUR NEGATIVE 10/31/2015 1243   KETONESUR NEGATIVE 10/31/2015 1243   PROTEINUR NEGATIVE 10/31/2015 1243   UROBILINOGEN 0.2 04/22/2010 1719   NITRITE NEGATIVE 10/31/2015 1243   LEUKOCYTESUR NEGATIVE 10/31/2015 1243   Microbiology No results found for this or any previous visit (from the past 240 hour(s)).  Radiology: US Biopsy  Result Date: 11/02/2015 CLINICAL DATA:  Large necrotic mass at the right lung apex invading the chest wall and axilla. Imaging has also demonstrated additional soft tissue mass in the right abdominal wall likely representing a metastatic lesion. The patient presents for biopsy of the abdominal wall mass. EXAM: ULTRASOUND GUIDED CORE BIOPSY OF RIGHT ABDOMINAL WALL MASS MEDICATIONS: 1.5 mg IV Versed; 75 mcg IV Fentanyl Total Moderate Sedation Time: 12 minutes. The patient's level of consciousness and physiologic status were  continuously monitored during the procedure by Radiology nursing. PROCEDURE: The procedure, risks, benefits, and alternatives were explained to the patient. Questions regarding the procedure were encouraged and answered. The patient understands and consents to the procedure. A time-out was performed prior to initiating the procedure. The right abdominal wall was prepped with chlorhexidine in a sterile fashion, and a sterile drape was applied covering the operative field. A sterile gown and sterile gloves were used for the procedure. Local anesthesia was provided with 1% Lidocaine. Ultrasound was used to localize a right abdominal wall mass. An 18 gauge core biopsy device was utilized in obtaining 4 core biopsy samples through different portions of the mass. Material was submitted in  formalin. COMPLICATIONS: None. FINDINGS: Solid and heterogeneous soft tissue mass of the superficial right abdominal wall measures up to approximately 3.5 cm in maximum diameter by ultrasound. Solid tissue was obtained. IMPRESSION: Ultrasound-guided core biopsy performed of a a right abdominal wall mass. Electronically Signed   By: Aletta Edouard M.D.   On: 11/02/2015 17:06   Dg Chest Port 1 View  Result Date: 11/04/2015 CLINICAL DATA:  Chronic right chest pain. Recently diagnosed with right-sided Pancoast tumor. Initial encounter. EXAM: PORTABLE CHEST 1 VIEW COMPARISON:  CT of the chest, and chest radiograph, performed 10/31/2015 FINDINGS: As noted on recent CT, the patient has a very large right apical mass, measuring 17 x 12 x 13 cm, compatible with a Pancoast tumor, and associated destruction of multiple ribs. An underlying small right pleural effusion and mild surrounding airspace opacity likely reflecting postobstructive consolidation, are grossly stable in appearance. No pneumothorax is seen. The cardiomediastinal silhouette is normal in size. No additional osseous abnormalities are identified. IMPRESSION: Very large right  apical mass again noted, measuring 17 x 12 x 13 cm on recent CT, compatible with a Pancoast tumor. Associated destruction of multiple right-sided ribs. Underlying small right pleural effusion and surrounding postobstructive consolidation are grossly stable from the recent study. Electronically Signed   By: Garald Balding M.D.   On: 11/04/2015 03:18    Medications:   . benztropine  0.5 mg Oral BID  . FLUoxetine  20 mg Oral Daily  . haloperidol  5 mg Oral BID  . nicotine  21 mg Transdermal Daily  . piperacillin-tazobactam (ZOSYN)  IV  3.375 g Intravenous Q8H  . sodium chloride flush  3 mL Intravenous Q12H  . traZODone  100 mg Oral QHS   Continuous Infusions: . sodium chloride 125 mL/hr at 11/03/15 2149    Time spent: 25 minutes.   LOS: 4 days   [facility] Pager [phone]. If unable to reach me by pager, please call my cell phone at [phone].  *Please refer to [email or web address], password [email or web address] to get updated schedule on who will round on this patient, as hospitalists switch teams weekly. If 7PM-7AM, please contact night-coverage at [email or web address], password [email or web address] for any overnight needs.  11/04/2015, 8:32 AM

## 2015-11-04 NOTE — Care Management Important Message (Deleted)
Important Message  Patient Details  Name: Keith Murray MRN: [id number] Date of Birth: 1960/07/29   Medicare Important Message Given:  Yes    Camillo Flaming 11/04/2015, 10:21 AMImportant Message  Patient Details  Name: Keith Murray MRN: [id number] Date of Birth: 11/24/1960   Medicare Important Message Given:  Yes    Camillo Flaming 11/04/2015, 10:21 AM

## 2015-11-04 NOTE — Progress Notes (Signed)
This morning around 0200, patient stated he was having shortness of breath. Oxygen sat was 97%, resp were 20. Patient had a congested cough and had rhonchi. NP on call was notified and new orders were given for chest xray and a breathing treatment. Once breathing treatment was done, patient stated that he felt much better. Will continue to monitor.

## 2015-11-05 DIAGNOSIS — C3411 Malignant neoplasm of upper lobe, right bronchus or lung: Secondary | ICD-10-CM | POA: Diagnosis present

## 2015-11-05 DIAGNOSIS — C3491 Malignant neoplasm of unspecified part of right bronchus or lung: Secondary | ICD-10-CM

## 2015-11-05 LAB — BASIC METABOLIC PANEL
Anion gap: 3 — ABNORMAL LOW (ref 5–15)
BUN: 14 mg/dL (ref 6–20)
CO2: 20 mmol/L — ABNORMAL LOW (ref 22–32)
Calcium: 10.8 mg/dL — ABNORMAL HIGH (ref 8.9–10.3)
Chloride: 112 mmol/L — ABNORMAL HIGH (ref 101–111)
Creatinine, Ser: 1 mg/dL (ref 0.61–1.24)
GFR calc Af Amer: >60 mL/min (ref >60)
GFR calc non Af Amer: >60 mL/min (ref >60)
Glucose, Bld: 98 mg/dL (ref 65–99)
Potassium: 5.2 mmol/L — ABNORMAL HIGH (ref 3.5–5.1)
Sodium: 135 mmol/L (ref 135–145)

## 2015-11-05 LAB — PHOSPHORUS: Phosphorus: 1.7 mg/dL — ABNORMAL LOW (ref 2.5–4.6)

## 2015-11-05 MED ORDER — SODIUM PHOSPHATES 45 MMOLE/15ML IV SOLN
20.0000 mmol | Freq: Once | INTRAVENOUS | Status: AC
  Administered 2015-11-05: 20 mmol via INTRAVENOUS
  Filled 2015-11-05: qty 6.67

## 2015-11-05 MED ORDER — SODIUM POLYSTYRENE SULFONATE 15 GM/60ML PO SUSP
15.0000 g | Freq: Once | ORAL | Status: AC
  Administered 2015-11-05: 15 g via ORAL
  Filled 2015-11-05: qty 60

## 2015-11-05 MED ORDER — SODIUM CHLORIDE 0.9 % IV SOLN: 90.0000 mg | Freq: Once | INTRAVENOUS | Status: DC

## 2015-11-05 MED ORDER — SODIUM CHLORIDE 0.9 % IV SOLN
90.0000 mg | Freq: Once | INTRAVENOUS | Status: DC
  Filled 2015-11-05: qty 10

## 2015-11-05 MED ORDER — GUAIFENESIN ER 600 MG PO TB12
600.0000 mg | ORAL_TABLET | Freq: Two times a day (BID) | ORAL | Status: DC
  Administered 2015-11-05 – 2015-11-07 (×5): 600 mg via ORAL
  Filled 2015-11-05 (×5): qty 1

## 2015-11-05 MED ORDER — IPRATROPIUM-ALBUTEROL 0.5-2.5 (3) MG/3ML IN SOLN
3.0000 mL | Freq: Three times a day (TID) | RESPIRATORY_TRACT | Status: DC
  Filled 2015-11-05: qty 3

## 2015-11-05 NOTE — Consult Note (Addendum)
Radiation Oncology         [phone] ________________________________  Name: Keith Murray MRN: [id number]  Date: 11/04/15 DOB: 07-Nov-1960  GN:[id number], Juanda Crumble, MD (Inactive)  No ref. provider found     REFERRING PHYSICIAN: No ref. provider found   DIAGNOSIS: The primary encounter diagnosis was Hypercalcemia. Diagnoses of Leukocytosis, Lung mass, Abdominal mass, Lung cancer ([place]), and SOB (shortness of breath) were also pertinent to this visit.   HISTORY OF PRESENT ILLNESS: Keith Murray is a 55 y.o. male seen at the request of Dr. Julien Nordmann for a new Stage IV NSCLC, squamous cell of the right lung involving erosion into the chest wall and with disease in the subcutaneous lesion on the patient's abdomen. Apparently the patient had a chest x ray in [place] a few months ago where he lives, but did not have follow up of this. He came to [place] to visit family and during this time noted progressive pain in his abdomen about a lesion that had developed in the last 2-3 weeks. He was seen in the ED and was found to have a large tumor in his right upper lobe eroding into surrounding ribs, and involving several subcutaneous sites and left lower iliac bone. His primary tumor measured 17 x 12 x 13. We are asked to see the patient to consider palliative radiotherapy to the chest and to the symptomatic subcutaneous lesions.    PREVIOUS RADIATION THERAPY: No   PAST MEDICAL HISTORY:  Past Medical History:  Diagnosis Date  . Anxiety   . Polysubstance abuse    cocaine, etoh, marijuana  . Psychosis   . Schizoaffective disorder ([place])        PAST SURGICAL HISTORY: Past Surgical History:  Procedure Laterality Date  . HERNIA REPAIR       FAMILY HISTORY:  Family History  Problem Relation Age of Onset  . Cancer Neg Hx   . Schizophrenia Neg Hx      SOCIAL HISTORY:  reports that he has been smoking.  He has a 2.00 pack-year smoking history. He has never used smokeless tobacco. He reports that he uses  drugs, including Cocaine and Marijuana. He reports that he does not drink alcohol. The patient is from [place], [place] and is anxious to be able to go home. He will plan to receive treatment at [facility] but plans to travel back and forth.    ALLERGIES: Review of patient's allergies indicates no known allergies.   MEDICATIONS:  Current Facility-Administered Medications  Medication Dose Route Frequency Provider Last Rate Last Dose  . 0.9 %  sodium chloride infusion   Intravenous Continuous Venetia Maxon Rama, MD 125 mL/hr at 11/04/15 2230    . acetaminophen (TYLENOL) tablet 650 mg  650 mg Oral Q6H PRN Toy Baker, MD       Or  . acetaminophen (TYLENOL) suppository 650 mg  650 mg Rectal Q6H PRN Toy Baker, MD      . albuterol (PROVENTIL) (2.5 MG/3ML) 0.083% nebulizer solution 2.5 mg  2.5 mg Nebulization Q6H PRN Clanford Marisa Hua, MD   2.5 mg at 11/05/15 0133  . benztropine (COGENTIN) tablet 0.5 mg  0.5 mg Oral BID Toy Baker, MD   0.5 mg at 11/04/15 2115  . FLUoxetine (PROZAC) capsule 20 mg  20 mg Oral Daily Toy Baker, MD   20 mg at 11/04/15 1007  . haloperidol (HALDOL) tablet 5 mg  5 mg Oral BID Clanford Marisa Hua, MD   5 mg at 11/04/15 2115  .  HYDROcodone-acetaminophen (NORCO/VICODIN) 5-325 MG per tablet 1-2 tablet  1-2 tablet Oral Q4H PRN Toy Baker, MD   2 tablet at 11/05/15 0518  . hydrOXYzine (ATARAX/VISTARIL) tablet 25 mg  25 mg Oral TID PRN Toy Baker, MD   25 mg at 11/04/15 0204  . ipratropium-albuterol (DUONEB) 0.5-2.5 (3) MG/3ML nebulizer solution 3 mL  3 mL Nebulization TID Christina P Rama, MD      . ketorolac (TORADOL) 30 MG/ML injection 30 mg  30 mg Intravenous Q6H Venetia Maxon Rama, MD   30 mg at 11/05/15 0315  . levofloxacin (LEVAQUIN) tablet 750 mg  750 mg Oral Daily Venetia Maxon Rama, MD   750 mg at 11/04/15 1240  . LORazepam (ATIVAN) injection 2-4 mg  2-4 mg Intravenous Q4H PRN Clanford Marisa Hua, MD   2 mg at 11/01/15 1607    . nicotine (NICODERM CQ - dosed in mg/24 hours) patch 21 mg  21 mg Transdermal Daily Toy Baker, MD   21 mg at 11/04/15 1006  . ondansetron (ZOFRAN) tablet 4 mg  4 mg Oral Q6H PRN Toy Baker, MD       Or  . ondansetron (ZOFRAN) injection 4 mg  4 mg Intravenous Q6H PRN Toy Baker, MD      . sodium chloride flush (NS) 0.9 % injection 3 mL  3 mL Intravenous Q12H Toy Baker, MD   3 mL at 11/04/15 2115  . traZODone (DESYREL) tablet 100 mg  100 mg Oral QHS Toy Baker, MD   100 mg at 11/04/15 2115     REVIEW OF SYSTEMS: On review of systems, the patient reports that he is doing ok but is still struggling with pain. He reports that he's had this pain for a couple of weeks, and notes some tightness in his right upper chest but denies any chest pain, shortness of breath, cough, fevers, chills, night sweats, unintended weight changes. He denies any bowel or bladder disturbances, and denies abdominal pain, nausea or vomiting. He denies any new musculoskeletal or joint aches or pains. A complete review of systems is obtained and is otherwise negative.     PHYSICAL EXAM:  height is '6\' 2"'$  (1.88 m) and weight is 182 lb 15.7 oz (83 kg). His oral temperature is 97.5 F (36.4 C). His blood pressure is 123/69 and his pulse is 88. His respiration is 18 and oxygen saturation is 98%.   Pain scale 6/10 In general this is a well appearing African American male in no acute distress. He is alert and oriented x4 and appropriate throughout the examination. HEENT reveals that the patient is normocephalic, atraumatic. EOMs are intact. PERRLA. Skin is intact without any evidence of gross lesions. Cardiovascular exam reveals a regular rate and rhythm, no clicks rubs or murmurs are auscultated. Chest is clear to auscultation bilaterally. Lymphatic assessment is performed and does not reveal any adenopathy in the cervical, supraclavicular, axillary, or inguinal chains. Abdomen has active bowel  sounds in all quadrants and is intact. The abdomen is soft, non tender, non distended. There is a 4 cm lesion over the right mid abdomen consistent with the known metastatic subcutaneous lesion. It is raised, and has not eroded through the skin. Lower extremities are negative for pretibial pitting edema, deep calf tenderness, cyanosis or clubbing.   ECOG =1  0 - Asymptomatic (Fully active, able to carry on all predisease activities without restriction)  1 - Symptomatic but completely ambulatory (Restricted in physically strenuous activity but ambulatory and able to carry out  work of a light or sedentary nature. For example, light housework, office work)  2 - Symptomatic, <50% in bed during the day (Ambulatory aHnd capable of all self care but unable to carry out any work activities. Up and about more than 50% of waking hours)  3 - Symptomatic, >50% in bed, but not bedbound (Capable of only limited self-care, confined to bed or chair 50% or more of waking hours)  4 - Bedbound (Completely disabled. Cannot carry on any self-care. Totally confined to bed or chair)  5 - Death   Eustace Pen MM, Creech RH, Tormey DC, et al. [phone]). "Toxicity and response criteria of the [facility]". [place] Oncol. 5 (6): 649-55    LABORATORY DATA:  Lab Results  Component Value Date   WBC 39.1 (H) 11/03/2015   HGB 9.0 (L) 11/03/2015   HCT 28.2 (L) 11/03/2015   MCV 86.2 11/03/2015   PLT 434 (H) 11/03/2015   Lab Results  Component Value Date   NA 135 11/05/2015   K 5.2 (H) 11/05/2015   CL 112 (H) 11/05/2015   CO2 20 (L) 11/05/2015   Lab Results  Component Value Date   ALT 6 (L) 11/03/2015   AST 10 (L) 11/03/2015   ALKPHOS 172 (H) 11/03/2015   BILITOT 0.6 11/03/2015      RADIOGRAPHY: Dg Chest 2 View  Result Date: 10/31/2015 CLINICAL DATA:  Right chest pain. EXAM: CHEST  2 VIEW COMPARISON:  Jul 27, 2015 FINDINGS: The mass in the right upper chest is significantly larger in the  interval measuring up to 15 cm in cranial caudal dimension today versus 10.6 cm previously. There is associated erosion of multiple ribs which was not seen previously in the adjacent ribs. The heart, hila, mediastinum, left lung, and lower right lung are otherwise unchanged. IMPRESSION: Enlarging right upper lobe pulmonary mass which may be pleural based. There is erosion of multiple right-sided ribs which was not seen previously. A chest CT could better evaluate. Electronically Signed   By: Dorise Bullion III M.D   On: 10/31/2015 13:34   Ct Chest W Contrast  Result Date: 10/31/2015 CLINICAL DATA:  Evaluate large right upper lobe mass. EXAM: CT CHEST WITH CONTRAST TECHNIQUE: Multidetector CT imaging of the chest was performed during intravenous contrast administration. CONTRAST:  35m ISOVUE-300 IOPAMIDOL (ISOVUE-300) INJECTION 61% COMPARISON:  Plain film of the chest from earlier today. FINDINGS: Cardiovascular: Thoracic aorta is normal in caliber and configuration. Heart size is normal. No pericardial effusion. Coronary artery calcifications noted. Mediastinum/Nodes: Conglomerate lymphadenopathy in the right hilum, measuring 3.8 x 2.4 cm. Additional conglomerate lymphadenopathy in the right lower peritracheal space, measuring up to 1.4 cm short axis dimension. Scattered smaller lymph nodes within the upper peritracheal and aortopulmonary window regions. Numerous enlarged and morphologically abnormal lymph nodes are seen within the right axilla. Lungs/Pleura: Massive solid-appearing mass in the right lung apex, measuring approximately 17 x 12 x 13 cm, centered in the right lung apex but extending into the overlying anterior and lateral right chest wall. There are destructive/erosive changes of the right first through fourth ribs. Diffuse ground-glass opacities along the medial and inferior margins of the right upper lobe mass could be edema or lymphangitic carcinomatosis. There is a moderate-sized right pleural  effusion. Left lung is clear. Upper Abdomen: Limited images of the upper abdomen are unremarkable. Musculoskeletal: Destructive changes of the right first through fourth ribs by the right upper lobe mass. No distant osseous metastases seen. As above, the  right upper lobe mass extends outward into the anterior and lateral right chest wall. Ill-defined fluid/edema is seen within the lower portions of the right chest wall, likely related to associated lymphatic and/or vascular obstruction. IMPRESSION: 1. Massive solid-appearing mass in the right lung apex, consistent with Pancoast tumor, measuring approximately 17 x 12 x 13 cm, centered in the right lung apex but extending peripherally into the overlying anterior and lateral right chest wall. Associated destructive/erosive changes of the right first through fourth ribs. 2. Diffuse ground-glass opacities along the medial and inferior margins of the right upper lobe mass could be postobstructive edema or lymphangitic carcinomatosis. 3. Right pleural effusion, moderate-sized. 4. Conglomerate lymphadenopathy in the mediastinum and right hilum, almost certainly metastatic disease. Additional lymphadenopathy in the right axilla. 5. Ill-defined fluid/edema within the subcutaneous soft tissues of the right lateral chest wall, presumably related to associated ectatic and/or vascular obstruction. Electronically Signed   By: Franki Cabot M.D.   On: 10/31/2015 17:31   Mr Jeri Cos LZ Contrast  Result Date: 11/01/2015 CLINICAL DATA:  Lung cancer.  Staging. EXAM: MRI HEAD WITHOUT AND WITH CONTRAST TECHNIQUE: Multiplanar, multiecho pulse sequences of the brain and surrounding structures were obtained without and with intravenous contrast. CONTRAST:  68m MULTIHANCE GADOBENATE DIMEGLUMINE 529 MG/ML IV SOLN COMPARISON:  None. FINDINGS: Motion degraded exam, primarily affecting postcontrast sequences. Calvarium and upper cervical spine: Hypo intense appearance of cervical spine marrow  with patchy hypo intense appearance of the clivus. These areas are homogeneously hypo enhancing. Orbits: Negative. Sinuses and Mastoids: Clear. Brain: No abnormal enhancement or edema to suggest intracranial metastasis. Mild white matter disease for age with nonspecific pattern of scattered FLAIR hyperintensities, usually chronic microvascular ischemia. Normal brain volume. No acute or remote infarct, hemorrhage, hydrocephalus, or major vessel occlusion. IMPRESSION: 1. Negative for intracranial metastatic disease. 2. Abnormal marrow which could be from anemia or metastatic disease. Electronically Signed   By: JMonte FantasiaM.D.   On: 11/01/2015 18:54   Ct Abdomen Pelvis W Contrast  Result Date: 11/01/2015 CLINICAL DATA:  Elevated white count and elevated calcium in the. Chest x-ray at that time showed possible right upper lung mass. Today presenting with right chest pain intermittent for months. Weight loss since May AP EXAM: CT ABDOMEN AND PELVIS WITH CONTRAST TECHNIQUE: Multidetector CT imaging of the abdomen and pelvis was performed using the standard protocol following bolus administration of intravenous contrast. CONTRAST:  102mISOVUE-300 IOPAMIDOL (ISOVUE-300) INJECTION 61% COMPARISON:  Chest CT dated 10/31/2015 and CT abdomen dated 07/15/2010. FINDINGS: Lower chest: Pleural effusion at the right lung base, consistent with the moderate to large pleural effusion appreciated on the earlier chest CT. Hepatobiliary: No mass or focal lesion identified within the liver. Gallbladder appears normal. Pancreas: No mass, inflammatory changes, or other significant abnormality. Spleen: Within normal limits in size and appearance. Adrenals/Urinary Tract: Adrenal glands appear normal. Punctate nonobstructing left renal stone. No associated hydronephrosis. Right kidney appears normal without stone or hydronephrosis. Bladder is unremarkable. Stomach/Bowel: Bowel is normal in caliber. Fairly large amount of stool and gas  throughout the colon. Stomach appears normal. Vascular/Lymphatic: Abdominal aorta is normal in caliber. No enlarged lymph nodes seen. Reproductive: No mass or other significant abnormality. Other: Hypodense mass is seen within the left upper quadrant, measuring 2.4 x 1.8 x 3 cm, located adjacent to the lateral margin of the left kidney and adjacent to the pancreatic tail. Based on the coronal reconstructions, this mass does not appear to be originating from the kidney or  pancreas. Suspect metastatic soft tissue implant. There is a soft tissue mass within the subcutaneous soft tissues of the right abdomen, just below the level of the liver, measuring 2.7 x 2.4 cm, highly suspicious for metastatic soft tissue implant. Small amount of free fluid in the lower pelvis. No circumscribed abscess collection identified. No free intraperitoneal air. Within the musculature immediately lateral to the lower left iliac bone, there is a hypodense mass/collection measuring 2.9 x 2 cm, of uncertain etiology or chronicity. Ill-defined edema is present throughout the remainder of the subcutaneous soft tissues suggesting anasarca. Musculoskeletal: Degenerative changes within the spine and at the hips, mild to moderate in degree. No acute or suspicious osseous finding. Probable AVN at the left femoral head. IMPRESSION: 1. Right pleural effusion, as seen on yesterday's chest CT. Almost certainly malignant effusion given the large Pancoast tumor identified on chest CT. 2. Soft tissue mass within the subcutaneous soft tissues of the right abdomen, measuring 2.7 x 2.4 cm, highly suspicious for metastatic soft tissue implant. 3. Hypodense rounded mass in the left upper quadrant, adjacent to the lateral margin of the left kidney and pancreatic tail, measuring 2.4 x 1.8 x 3 cm. Based on the coronal reconstructions, this is most likely not originating from the kidney or pancreas, suspected metastatic soft tissue implant. 4. Additional hypodense  mass/collection just lateral to the lower left iliac bone, measuring 2.9 x 2 cm, of less suspicious appearance than the findings described above but could represent additional metastatic implant. 5. Small amount of free fluid in the lower pelvis.  Anasarca. 6. Fairly large amount of stool and gas throughout the colon (constipation? ). No evidence of bowel obstruction. 7. No evidence of osseous metastasis. Probable chronic AVN of the left femoral head. Electronically Signed   By: Franki Cabot M.D.   On: 11/01/2015 17:54   US Biopsy  Result Date: 11/02/2015 CLINICAL DATA:  Large necrotic mass at the right lung apex invading the chest wall and axilla. Imaging has also demonstrated additional soft tissue mass in the right abdominal wall likely representing a metastatic lesion. The patient presents for biopsy of the abdominal wall mass. EXAM: ULTRASOUND GUIDED CORE BIOPSY OF RIGHT ABDOMINAL WALL MASS MEDICATIONS: 1.5 mg IV Versed; 75 mcg IV Fentanyl Total Moderate Sedation Time: 12 minutes. The patient's level of consciousness and physiologic status were continuously monitored during the procedure by Radiology nursing. PROCEDURE: The procedure, risks, benefits, and alternatives were explained to the patient. Questions regarding the procedure were encouraged and answered. The patient understands and consents to the procedure. A time-out was performed prior to initiating the procedure. The right abdominal wall was prepped with chlorhexidine in a sterile fashion, and a sterile drape was applied covering the operative field. A sterile gown and sterile gloves were used for the procedure. Local anesthesia was provided with 1% Lidocaine. Ultrasound was used to localize a right abdominal wall mass. An 18 gauge core biopsy device was utilized in obtaining 4 core biopsy samples through different portions of the mass. Material was submitted in formalin. COMPLICATIONS: None. FINDINGS: Solid and heterogeneous soft tissue mass of  the superficial right abdominal wall measures up to approximately 3.5 cm in maximum diameter by ultrasound. Solid tissue was obtained. IMPRESSION: Ultrasound-guided core biopsy performed of a a right abdominal wall mass. Electronically Signed   By: Aletta Edouard M.D.   On: 11/02/2015 17:06   Dg Chest Port 1 View  Result Date: 11/04/2015 CLINICAL DATA:  Chronic right chest pain. Recently diagnosed with  right-sided Pancoast tumor. Initial encounter. EXAM: PORTABLE CHEST 1 VIEW COMPARISON:  CT of the chest, and chest radiograph, performed 10/31/2015 FINDINGS: As noted on recent CT, the patient has a very large right apical mass, measuring 17 x 12 x 13 cm, compatible with a Pancoast tumor, and associated destruction of multiple ribs. An underlying small right pleural effusion and mild surrounding airspace opacity likely reflecting postobstructive consolidation, are grossly stable in appearance. No pneumothorax is seen. The cardiomediastinal silhouette is normal in size. No additional osseous abnormalities are identified. IMPRESSION: Very large right apical mass again noted, measuring 17 x 12 x 13 cm on recent CT, compatible with a Pancoast tumor. Associated destruction of multiple right-sided ribs. Underlying small right pleural effusion and surrounding postobstructive consolidation are grossly stable from the recent study. Electronically Signed   By: Garald Balding M.D.   On: 11/04/2015 03:18       IMPRESSION: Stage IV, NSCLC, squamous cell carcinoma of the right lung with metastatic disease to anterior abdominal wall.   PLAN: Dr. Lisbeth Renshaw has reviewed the patient's films and has recommended proceeding with palliative radiotherapy to the RUL pancoast tumor as well as to the anterior abdominal wall. Both locations will receive 30 Gy in 10 fractions and we will begin simulation tomorrow with anticipation of beginning treatment on Monday 11/09/15. The patient plans to begin any systemic therapy with Dr. Julien Nordmann  locally as well, but would like to be able to commute back and forth from his home in [place], [place].      Carola Rhine, PAC

## 2015-11-05 NOTE — Progress Notes (Signed)
Progress Note    Keith Murray  BSJ:[id number] DOB: December 20, 1960  DOA: 10/31/2015 PCP: Grant Fontana, MD (Inactive)    Brief Narrative:   Keith Murray is an 55 y.o. male with a PMH of cocaine/alcohol abuse, schizoaffective/bipolar disorder who was admitted 10/31/15 for evaluation of a right upper lung mass, elevated WBC and calcium and right chest pain. He was diagnosed with the mass back in May, but was lost to follow-up.  Assessment/Plan:   Principal Problem:   Postobstructive pneumoniaIn the setting of metastatic, poorly differentiated lung carcinoma/Leukocytosis Status post biopsy 11/02/15. Pathology confirms metastatic poorly differentiated squamous cell carcinoma. Status post 4 days of therapy with Zosyn which was narrowed to Levaquin 11/04/15.  Dr. Julien Nordmann will arrange for outpatient follow-up. IV Toradol started 11/04/15 for improved pain control. Treatment recommendations are pending molecular studies.    Hypercalcemia of malignancy Treated with IV fluids. Calcium remains mildly elevated. S/P Pamidronate 10/28/15. Can repeat 11/05/15.  Active Problems:   Hyperkalemia Possibly a hemolyzed specimen, but potassium appears to be steadily rising. Not on any supplements that would cause. We'll give 15 g of Kayexalate.    Hypophosphatemia We'll give additional phosphorus supplementation today, phosphorus still low.    Polysubstance (excluding opioids) dependence ([place]) Urine toxicology screen was negative on admission. Avoid IV narcotics.    Schizoaffective disorder, bipolar type (HCC) Continue Prozac, Haldol and Cogentin. Continue Vistaril as needed for anxiety.   Family Communication/Anticipated D/C date and plan/Code Status   DVT prophylaxis: Lovenox ordered. Code Status: Full Code.  Family Communication: No family currently at the bedside. Niece updated by telephone. Disposition Plan: Home tomorrow depending on stability of electrolytes.   Medical Consultants:     Oncology  Interventional Radiology   Procedures:    Anti-Infectives:   Zosyn 10/31/15 ---> 11/04/15 Levaquin 11/04/15--->   Subjective:   Keith Murray reports that the Toradol has helped his pain some. No nausea or vomiting. Reports that his appetite is okay. Bowels are moving. Informed of his diagnosis of cancer. Seemed to have limited comprehension. Lengthy discussion held with the patient's niece by telephone who is an Therapist, sports.  Objective:    Vitals:   11/04/15 1300 11/04/15 2104 11/05/15 0134 11/05/15 0521  BP: 110/61 119/61  123/69  Pulse: 87 95  88  Resp: '18 18  18  '$ Temp: 98.7 F (37.1 C) 98.5 F (36.9 C)  97.5 F (36.4 C)  TempSrc: Oral Oral  Oral  SpO2: 95% 100% 95% 98%  Weight:      Height:        Intake/Output Summary (Last 24 hours) at 11/05/15 0734 Last data filed at 11/04/15 2230  Gross per 24 hour  Intake          2682.09 ml  Output                0 ml  Net          2682.09 ml   Filed Weights   10/31/15 1240 10/31/15 2053  Weight: 83 kg (183 lb) 83 kg (182 lb 15.7 oz)    Exam: General exam: Appears calm and comfortable. Respiratory system: Clear to auscultation, But diminished. Respiratory effort normal. Cardiovascular system: S1 & S2 [place], RRR. No JVD,  rubs, gallops or clicks. No murmurs. Gastrointestinal system: Abdomen is nondistended, soft and nontender. No organomegaly or masses felt. Normal bowel sounds [place]. Central nervous system: Alert and oriented. No focal neurological deficits. Extremities: No clubbing,  or cyanosis. No edema. Skin: No  rashes, lesions or ulcers. Psychiatry: Judgement and insight appear diminished. Mood & affect less irritable.   Data Reviewed:   I have personally reviewed following labs and imaging studies:  Labs: Basic Metabolic Panel:  Recent Labs Lab 11/01/15 0531 11/02/15 0512 11/03/15 0354 11/04/15 1009 11/05/15 0503  NA 135 136 136 132* 135  K 3.7 3.9 4.1 4.7 5.2*  CL 110 113* 111 110 112*  CO2  21* 18* 19* 17* 20*  GLUCOSE 95 85 100* 105* 98  BUN '13 11 10 10 14  '$ CREATININE 1.06 1.11 1.09 1.10 1.00  CALCIUM 11.0* 10.7* 10.4* 10.6* 10.8*  MG 1.8  --   --   --   --   PHOS 1.6*  --  1.4* 1.8* 1.7*   GFR Estimated Creatinine Clearance: 98.2 mL/min (by C-G formula based on SCr of 1 mg/dL). Liver Function Tests:  Recent Labs Lab 10/31/15 1249 11/01/15 0531 11/02/15 0512 11/03/15 0354  AST 13* 11* 9* 10*  ALT 10* 7* 7* 6*  ALKPHOS 201* 172* 158* 172*  BILITOT 0.4 0.6 0.3 0.6  PROT 7.3 6.1* 6.1* 6.3*  ALBUMIN 2.9* 2.6* 2.5* 2.6*    Recent Labs Lab 10/31/15 1249  LIPASE 16   Coagulation profile  Recent Labs Lab 11/01/15 0531  INR 1.13    CBC:  Recent Labs Lab 10/31/15 1249 10/31/15 1906 11/01/15 0531 11/02/15 0512 11/03/15 0354  WBC 40.7* 45.5* 40.0* 39.4* 39.1*  NEUTROABS  --  41.4*  --  35.8* 35.5*  HGB 10.4* 9.9* 8.8* 9.2* 9.0*  HCT 31.7* 29.6* 27.1* 28.3* 28.2*  MCV 86.6 85.5 86.0 86.8 86.2  PLT 391 438* 405* 424* 434*   Microbiology No results found for this or any previous visit (from the past 240 hour(s)).  Radiology: US Biopsy  Result Date: 11/02/2015 CLINICAL DATA:  Large necrotic mass at the right lung apex invading the chest wall and axilla. Imaging has also demonstrated additional soft tissue mass in the right abdominal wall likely representing a metastatic lesion. The patient presents for biopsy of the abdominal wall mass. EXAM: ULTRASOUND GUIDED CORE BIOPSY OF RIGHT ABDOMINAL WALL MASS MEDICATIONS: 1.5 mg IV Versed; 75 mcg IV Fentanyl Total Moderate Sedation Time: 12 minutes. The patient's level of consciousness and physiologic status were continuously monitored during the procedure by Radiology nursing. PROCEDURE: The procedure, risks, benefits, and alternatives were explained to the patient. Questions regarding the procedure were encouraged and answered. The patient understands and consents to the procedure. A time-out was performed prior to  initiating the procedure. The right abdominal wall was prepped with chlorhexidine in a sterile fashion, and a sterile drape was applied covering the operative field. A sterile gown and sterile gloves were used for the procedure. Local anesthesia was provided with 1% Lidocaine. Ultrasound was used to localize a right abdominal wall mass. An 18 gauge core biopsy device was utilized in obtaining 4 core biopsy samples through different portions of the mass. Material was submitted in formalin. COMPLICATIONS: None. FINDINGS: Solid and heterogeneous soft tissue mass of the superficial right abdominal wall measures up to approximately 3.5 cm in maximum diameter by ultrasound. Solid tissue was obtained. IMPRESSION: Ultrasound-guided core biopsy performed of a a right abdominal wall mass. Electronically Signed   By: Aletta Edouard M.D.   On: 11/02/2015 17:06   Dg Chest Port 1 View  Result Date: 11/04/2015 CLINICAL DATA:  Chronic right chest pain. Recently diagnosed with right-sided Pancoast tumor. Initial encounter. EXAM: PORTABLE CHEST 1 VIEW COMPARISON:  CT of the chest, and chest radiograph, performed 10/31/2015 FINDINGS: As noted on recent CT, the patient has a very large right apical mass, measuring 17 x 12 x 13 cm, compatible with a Pancoast tumor, and associated destruction of multiple ribs. An underlying small right pleural effusion and mild surrounding airspace opacity likely reflecting postobstructive consolidation, are grossly stable in appearance. No pneumothorax is seen. The cardiomediastinal silhouette is normal in size. No additional osseous abnormalities are identified. IMPRESSION: Very large right apical mass again noted, measuring 17 x 12 x 13 cm on recent CT, compatible with a Pancoast tumor. Associated destruction of multiple right-sided ribs. Underlying small right pleural effusion and surrounding postobstructive consolidation are grossly stable from the recent study. Electronically Signed   By:  Garald Balding M.D.   On: 11/04/2015 03:18    Medications:   . benztropine  0.5 mg Oral BID  . FLUoxetine  20 mg Oral Daily  . haloperidol  5 mg Oral BID  . ipratropium-albuterol  3 mL Nebulization TID  . ketorolac  30 mg Intravenous Q6H  . levofloxacin  750 mg Oral Daily  . nicotine  21 mg Transdermal Daily  . sodium chloride flush  3 mL Intravenous Q12H  . traZODone  100 mg Oral QHS   Continuous Infusions: . sodium chloride 125 mL/hr at 11/04/15 2230    Time spent: 35 minutes with > 50% of time discussing current diagnostic test results, clinical impression and plan of care.   LOS: 5 days   [facility] Pager [phone]. If unable to reach me by pager, please call my cell phone at [phone].  *Please refer to [email or web address], password [email or web address] to get updated schedule on who will round on this patient, as hospitalists switch teams weekly. If 7PM-7AM, please contact night-coverage at [email or web address], password [email or web address] for any overnight needs.  11/05/2015, 7:34 AM

## 2015-11-06 ENCOUNTER — Telehealth: Payer: Self-pay | Admitting: *Deleted

## 2015-11-06 ENCOUNTER — Ambulatory Visit
Admit: 2015-11-06 | Discharge: 2015-11-06 | Disposition: A | Discharge status: Home / Self Care | Payer: Medicare Other | Attending: Radiation Oncology | Admitting: Radiation Oncology

## 2015-11-06 DIAGNOSIS — IMO0001 Reserved for inherently not codable concepts without codable children: Secondary | ICD-10-CM | POA: Insufficient documentation

## 2015-11-06 DIAGNOSIS — G893 Neoplasm related pain (acute) (chronic): Secondary | ICD-10-CM | POA: Insufficient documentation

## 2015-11-06 DIAGNOSIS — C3411 Malignant neoplasm of upper lobe, right bronchus or lung: Secondary | ICD-10-CM

## 2015-11-06 LAB — PTH-RELATED PEPTIDE: PTH-related peptide: 2.1 pmol/L

## 2015-11-06 LAB — BASIC METABOLIC PANEL
Anion gap: 5 (ref 5–15)
BUN: 14 mg/dL (ref 6–20)
CO2: 20 mmol/L — ABNORMAL LOW (ref 22–32)
Calcium: 10.2 mg/dL (ref 8.9–10.3)
Chloride: 112 mmol/L — ABNORMAL HIGH (ref 101–111)
Creatinine, Ser: 1.06 mg/dL (ref 0.61–1.24)
GFR calc Af Amer: >60 mL/min (ref >60)
GFR calc non Af Amer: >60 mL/min (ref >60)
Glucose, Bld: 95 mg/dL (ref 65–99)
Potassium: 4.8 mmol/L (ref 3.5–5.1)
Sodium: 137 mmol/L (ref 135–145)

## 2015-11-06 LAB — PHOSPHORUS: Phosphorus: 2.4 mg/dL — ABNORMAL LOW (ref 2.5–4.6)

## 2015-11-06 MED ORDER — ALBUTEROL SULFATE (2.5 MG/3ML) 0.083% IN NEBU: 2.5000 mg | INHALATION_SOLUTION | Freq: Four times a day (QID) | RESPIRATORY_TRACT | 12 refills | Status: DC | PRN

## 2015-11-06 MED ORDER — HYDROCODONE-ACETAMINOPHEN 5-325 MG PO TABS: 1.0000 | ORAL_TABLET | ORAL | 0 refills | Status: DC | PRN

## 2015-11-06 MED ORDER — LEVOFLOXACIN 750 MG PO TABS: 750.0000 mg | ORAL_TABLET | Freq: Every day | ORAL | 0 refills | Status: DC

## 2015-11-06 MED ORDER — NAPROXEN 500 MG PO TBEC: 500.0000 mg | DELAYED_RELEASE_TABLET | Freq: Two times a day (BID) | ORAL | 2 refills | Status: DC

## 2015-11-06 MED ORDER — NICOTINE 21 MG/24HR TD PT24: 21.0000 mg | MEDICATED_PATCH | Freq: Every day | TRANSDERMAL | 0 refills | Status: DC

## 2015-11-06 MED ORDER — K PHOS MONO-SOD PHOS DI & MONO 155-852-130 MG PO TABS: 250.0000 mg | ORAL_TABLET | Freq: Two times a day (BID) | ORAL | 0 refills | Status: DC

## 2015-11-06 MED ORDER — FLUOXETINE HCL 20 MG PO CAPS: 20.0000 mg | ORAL_CAPSULE | Freq: Every day | ORAL | 3 refills | Status: DC

## 2015-11-06 MED ORDER — FUROSEMIDE 10 MG/ML IJ SOLN
20.0000 mg | Freq: Once | INTRAMUSCULAR | Status: AC
  Administered 2015-11-06: 20 mg via INTRAVENOUS
  Filled 2015-11-06: qty 2

## 2015-11-06 MED ORDER — SODIUM PHOSPHATES 45 MMOLE/15ML IV SOLN
20.0000 mmol | Freq: Once | INTRAVENOUS | Status: AC
  Administered 2015-11-06: 20 mmol via INTRAVENOUS
  Filled 2015-11-06: qty 6.67

## 2015-11-06 MED ORDER — GUAIFENESIN ER 600 MG PO TB12: 600.0000 mg | ORAL_TABLET | Freq: Two times a day (BID) | ORAL | 2 refills | Status: DC

## 2015-11-06 MED ORDER — K PHOS MONO-SOD PHOS DI & MONO 155-852-130 MG PO TABS
250.0000 mg | ORAL_TABLET | Freq: Two times a day (BID) | ORAL | Status: DC
  Administered 2015-11-07: 250 mg via ORAL
  Filled 2015-11-06: qty 1

## 2015-11-06 NOTE — Discharge Instructions (Signed)
Lung Cancer °Lung cancer occurs when abnormal cells in the lung grow out of control and form a mass (tumor). There are several types of lung cancer. The two most common types are: °· Non-small cell. In this type of lung cancer, abnormal cells are larger and grow more slowly than those of small cell lung cancer. °· Small cell. In this type of lung cancer, abnormal cells are smaller than those of non-small cell lung cancer. Small cell lung cancer gets worse faster than non-small cell lung cancer. °CAUSES  °The leading cause of lung cancer is smoking tobacco. The second leading cause is radon exposure. °RISK FACTORS °· Smoking tobacco. °· Exposure to secondhand tobacco smoke. °· Exposure to radon gas. °· Exposure to asbestos. °· Exposure to arsenic in drinking water. °· Air pollution. °· Family or personal history of lung cancer. °· Lung radiation therapy. °· Being older than 65 years. °SIGNS AND SYMPTOMS  °In the early stages, symptoms may not be present. As the cancer progresses, symptoms may include: °· A lasting cough, possibly with blood. °· Fatigue. °· Unexplained weight loss. °· Shortness of breath. °· Wheezing. °· Chest pain. °· Loss of appetite. °Symptoms of advanced lung cancer include: °· Hoarseness. °· Bone or joint pain. °· Weakness. °· Nail problems. °· Face or arm swelling. °· Paralysis of the face. °· Drooping eyelids. °DIAGNOSIS  °Lung cancer can be identified with a physical exam and with tests such as: °· A chest X-ray. °· A CT scan. °· Blood tests. °· A biopsy. °After a diagnosis is made, you will have more tests to determine the stage of the cancer. The stages of non-small cell lung cancer are: °· Stage 0, also called carcinoma in situ. At this stage, abnormal cells are found in the inner lining of your lung or lungs. °· Stage I. At this stage, abnormal cells have grown into a tumor that is no larger than 5 cm across. The cancer has entered the deeper lung tissue but has not yet entered the lymph  nodes or other parts of the body. °· Stage II. At this stage, the tumor is 7 cm across or smaller and has entered nearby lymph nodes. Or, the tumor is 5 cm across or smaller and has invaded surrounding tissue but is not found in nearby lymph nodes. There may be more than one tumor present. °· Stage III. At this stage, the tumor may be any size. There may be more than one tumor in the lungs. The cancer cells have spread to the lymph nodes and possibly to other organs. °· Stage IV. At this stage, there are tumors in both lungs and the cancer has spread to other areas of the body. °The stages of small cell lung cancer are: °· Limited. At this stage, the cancer is found only on one side of the chest. °· Extensive. At this stage, the cancer is in the lungs and in tissues on the other side of the chest. The cancer has spread to other organs or is found in the fluid between the layers of your lungs. °TREATMENT  °Depending on the type and stage of your lung cancer, you may be treated with: °· Surgery. This is done to remove a tumor. °· Radiation therapy. This treatment destroys cancer cells using X-rays or other types of radiation. °· Chemotherapy. This treatment uses medicines to destroy cancer cells. °· Targeted therapy. This treatment aims to destroy only cancer cells instead of all cells as other therapies do. °You may   also have a combination of treatments. °HOME CARE INSTRUCTIONS  °· Do not use any tobacco products. This includes cigarettes, chewing tobacco, and electronic cigarettes. If you need help quitting, ask your health care provider. °· Take medicines only as directed by your health care provider. °· Eat a healthy diet. Work with a dietitian to make sure you are getting the nutrition you need. °· Consider joining a support group or seeking counseling to help you cope with the stress of having lung cancer. °· Let your cancer specialist (oncologist) know if you are admitted to the hospital. °· Keep all follow-up  visits as directed by your health care provider. This is important. °SEEK MEDICAL CARE IF:  °· You lose weight without trying. °· You have a persistent cough and wheezing. °· You feel short of breath. °· You tire easily. °· You experience bone or joint pain. °· You have difficulty swallowing. °· You feel hoarse or notice your voice changing. °· Your pain medicine is not helping. °SEEK IMMEDIATE MEDICAL CARE IF:  °· You cough up blood. °· You have new breathing problems. °· You develop chest pain. °· You develop swelling in: °¨ One or both ankles or legs. °¨ Your face, neck, or arms. °· You are confused. °· You experience paralysis in your face or a drooping eyelid. °  °This information is not intended to replace advice given to you by your health care provider. Make sure you discuss any questions you have with your health care provider. °  °Document Released: 06/20/2000 Document Revised: 12/03/2014 Document Reviewed: 07/18/2013 °Elsevier Interactive Patient Education ©2016 [employer] ° °

## 2015-11-06 NOTE — Care Management Note (Signed)
Case Management Note  Patient Details  Name: Hutton Pellicane MRN: [id number] Date of Birth: [birth year]  Subjective/Objective: Spoke to Amanda(Niece) offered choice for [facility] ordered. [facility] chosen-[facility] rep Santiago Glad aware will await acceptance.                   Action/Plan:d/c home w/HHC.   Expected Discharge Date:                  Expected Discharge Plan:  [place]  In-House Referral:     Discharge planning Services  CM Consult  Post Acute Care Choice:    Choice offered to:   (Amanda(niece) offered choice)  DME Arranged:  Nebulizer machine DME Agency:  [place]:  RN, Nurse's Aide, Social Work [employer] Agency:  [place]  Status of Service:  Completed, signed off  If discussed at [employer], dates discussed:    Additional Comments:  Dessa Phi, RN 11/06/2015, 3:36 PM

## 2015-11-06 NOTE — Care Management Note (Signed)
Case Management Note  Patient Details  Name: Keith Murray MRN: [id number] Date of Birth: 1961/01/18  Subjective/Objective:  Ordered for home nebulizer machine-[facility] dme rep Jermaine aware of order, & d/c today to deliver to rm.No further CM needs.                  Action/Plan:d/c home w/nebulizer machine.   Expected Discharge Date:                  Expected Discharge Plan:  Home/Self Care  In-House Referral:     Discharge planning Services  CM Consult  Post Acute Care Choice:    Choice offered to:  Patient  DME Arranged:  Nebulizer machine DME Agency:  [place]:    [facility] Agency:     Status of Service:  Completed, signed off  If discussed at [place] of Stay Meetings, dates discussed:    Additional Comments:  Dessa Phi, RN 11/06/2015, 11:18 AM

## 2015-11-06 NOTE — Care Management Note (Signed)
Case Management Note  Patient Details  Name: Shraga Custard MRN: [id number] Date of Birth: December 30, 1960  Subjective/Objective:  Amanda(niece) asked about radiation otpt appt for transportation-informed niece that she can contact radiation otpt dept to reschedule the appt-she voiced understanding.                  Action/Plan:d/c home w/HHC.   Expected Discharge Date:                  Expected Discharge Plan:  [place]  In-House Referral:     Discharge planning Services  CM Consult  Post Acute Care Choice:    Choice offered to:   (Amanda(niece) offered choice)  DME Arranged:  Nebulizer machine DME Agency:  [place]:  RN, Nurse's Aide, Social Work [employer] Agency:  [place]  Status of Service:  Completed, signed off  If discussed at [employer], dates discussed:    Additional Comments:  Dessa Phi, RN 11/06/2015, 5:12 PM

## 2015-11-06 NOTE — Telephone Encounter (Signed)
Called the floor patwitn already coming for ct sim

## 2015-11-06 NOTE — Care Management Note (Signed)
Case Management Note  Patient Details  Name: Keith Murray MRN: [id number] Date of Birth: May 20, 1960  Subjective/Objective:  Received call from niece-Keith Murray with concerns about patient's d/c to home. She stated that patient's home had water issues-water is brown,& not running well, the landlord is aware, so the patient will stay w/her @ d/c-her address [address] [place] [name] [address] tel#[phone]. She also wants hospice services-she stated I know he has a prognosis of 4 months,&" I know that will qualify him for hospice". 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 I have informed the attending,& nurse. I can offer [facility], & [place] social worker or home hospice eval if MD agrees.                   Action/Plan:d/c home.   Expected Discharge Date:                  Expected Discharge Plan:  Home/Self Care  In-House Referral:     Discharge planning Services  CM Consult  Post Acute Care Choice:    Choice offered to:  Patient  DME Arranged:  Nebulizer machine DME Agency:  [place]:    [facility] Agency:     Status of Service:  Completed, signed off  If discussed at [place] of Stay Meetings, dates discussed:    Additional Comments:  Dessa Phi, RN 11/06/2015, 2:13 PM

## 2015-11-06 NOTE — Discharge Summary (Addendum)
Physician Discharge Summary  Keith Murray UVO:[id number] DOB: 05/19/1960 DOA: 10/31/2015  PCP: Grant Fontana, MD (Inactive)  Admit date: 10/31/2015 Discharge date: 11/06/2015  Admitted From: Home Discharge disposition: Home (will be staying with his niece)   Recommendations for Outpatient Follow-Up:   1. The patient will follow-up with Dr. Julien Nordmann for treatment recommendations. 2. Home health nurse, social worker, and aide set up. 3. Radiation simulation performed with plans to initiate radiation therapy on 11/09/15.   Discharge Diagnosis:   Principal Problem:    Squamous cell carcinoma of lung, stage IV (HCC) Active Problems:    Polysubstance (excluding opioids) dependence (HCC)    Schizoaffective disorder, bipolar type (HCC)    Hypercalcemia of malignancy    Lung mass    Postobstructive pneumonia    Hypercalcemia    Leukocytosis    Hypophosphatemia    Cancer related pain  Discharge Condition: Improved.  Diet recommendation:  Regular.   History of Present Illness:   Keith Murray is an 55 y.o. male with a PMH of cocaine/alcohol abuse, schizoaffective/bipolar disorder who was admitted 10/31/15 for evaluation of a right upper lung mass, elevated WBC and calcium and right chest pain. He was diagnosed with the mass back in May, but was lost to follow-up.  Hospital Course by Problem:   Principal Problem:   Postobstructive pneumonia in the setting of metastatic, poorly differentiated lung carcinoma/Leukocytosis/Cancer related pain Status post biopsy 11/02/15. Pathology confirms metastatic poorly differentiated squamous cell carcinoma. Status post 4 days of therapy with Zosyn which was narrowed to Levaquin 11/04/15.  Will complete a 7 day course of Levaquin 11/07/15. Dr. Julien Nordmann will arrange for outpatient follow-up. Treatment recommendations are pending molecular studies. Discharge home on Naprosyn 500 mg every 12 hours and hydrocodone 1-2 tabs every 4 hours as needed for  breakthrough pain.Patient has been seen by radiation oncology with simulation performed 11/06/15 and plans to initiate therapy on 11/09/15.    Hypercalcemia of malignancy Treated with IV fluids. Status post pamidronate. Calcium trending down.   Active Problems:   Hyperkalemia Given a dose of Kayexalate 15 g 11/05/15 with return of potassium to normal.    Hypophosphatemia We'll give additional phosphorus supplementation today, phosphorus still low, but improving. We'll also discharge home on routine oral supplementation with Neutra-Phos.    Polysubstance (excluding opioids) dependence ([place]) Urine toxicology screen was negative on admission. IV narcotics were avoided.    Schizoaffective disorder, bipolar type (HCC) Continue Prozac, Haldol and Cogentin. Continue Vistaril as needed for anxiety.   Medical Consultants:    Oncology  Radiation Oncology   Discharge Exam:   Vitals:   11/06/15 0605 11/06/15 1041  BP: 111/71   Pulse: (!) 101 89  Resp: 18 (!) 22  Temp: 98.3 F (36.8 C)    Vitals:   11/05/15 2112 11/06/15 0240 11/06/15 0605 11/06/15 1041  BP: 118/77  111/71   Pulse: (!) 108  (!) 101 89  Resp: 18  18 (!) 22  Temp: 98.3 F (36.8 C)  98.3 F (36.8 C)   TempSrc: Oral  Oral   SpO2: 96% 92% 99% 96%  Weight:      Height:        General exam: Appears calm and comfortable. Respiratory system: Clear to auscultation, but diminished. Respiratory effort normal. Cardiovascular system: S1 & S2 [place], RRR. No JVD,  rubs, gallops or clicks. No murmurs. Gastrointestinal system: Abdomen is nondistended, soft and nontender. No organomegaly or masses felt. Normal bowel sounds [place]. Central nervous system: Alert and  oriented. No focal neurological deficits. Extremities: No clubbing,  or cyanosis. No edema. Skin: No rashes, lesions or ulcers. Psychiatry: Judgement and insight appear diminished. Mood & affect less irritable.    The results of significant diagnostics from  this hospitalization (including imaging, microbiology, ancillary and laboratory) are listed below for reference.     Procedures and Diagnostic Studies:   No results found.   Labs:   Basic Metabolic Panel:  Recent Labs Lab 11/01/15 0531 11/02/15 0512 11/03/15 0354 11/04/15 1009 11/05/15 0503 11/06/15 0519  NA 135 136 136 132* 135 137  K 3.7 3.9 4.1 4.7 5.2* 4.8  CL 110 113* 111 110 112* 112*  CO2 21* 18* 19* 17* 20* 20*  GLUCOSE 95 85 100* 105* 98 95  BUN '13 11 10 10 14 14  '$ CREATININE 1.06 1.11 1.09 1.10 1.00 1.06  CALCIUM 11.0* 10.7* 10.4* 10.6* 10.8* 10.2  MG 1.8  --   --   --   --   --   PHOS 1.6*  --  1.4* 1.8* 1.7* 2.4*   GFR Estimated Creatinine Clearance: 92.6 mL/min (by C-G formula based on SCr of 1.06 mg/dL). Liver Function Tests:  Recent Labs Lab 10/31/15 1249 11/01/15 0531 11/02/15 0512 11/03/15 0354  AST 13* 11* 9* 10*  ALT 10* 7* 7* 6*  ALKPHOS 201* 172* 158* 172*  BILITOT 0.4 0.6 0.3 0.6  PROT 7.3 6.1* 6.1* 6.3*  ALBUMIN 2.9* 2.6* 2.5* 2.6*    Recent Labs Lab 10/31/15 1249  LIPASE 16   Coagulation profile  Recent Labs Lab 11/01/15 0531  INR 1.13    CBC:  Recent Labs Lab 10/31/15 1249 10/31/15 1906 11/01/15 0531 11/02/15 0512 11/03/15 0354  WBC 40.7* 45.5* 40.0* 39.4* 39.1*  NEUTROABS  --  41.4*  --  35.8* 35.5*  HGB 10.4* 9.9* 8.8* 9.2* 9.0*  HCT 31.7* 29.6* 27.1* 28.3* 28.2*  MCV 86.6 85.5 86.0 86.8 86.2  PLT 391 438* 405* 424* 434*     Discharge Instructions:   Discharge Instructions    Call MD for:  persistant dizziness or light-headedness    Complete by:  As directed   Call MD for:  persistant nausea and vomiting    Complete by:  As directed   Call MD for:  severe uncontrolled pain    Complete by:  As directed   Call MD for:  temperature >100.4    Complete by:  As directed   DME Nebulizer machine    Complete by:  As directed   Diet - low sodium heart healthy    Complete by:  As directed   Face-to-face encounter  (required for Medicare/Medicaid patients)    Complete by:  As directed   I Joannah Gitlin certify that this patient is under my care and that I, or a nurse practitioner or physician's assistant working with me, had a face-to-face encounter that meets the physician face-to-face encounter requirements with this patient on 11/06/2015. The encounter with the patient was in whole, or in part for the following medical condition(s) which is the primary reason for home health care (List medical condition): Patient has newly diagnosed lung cancer, limited social support. He will live with his niece temporarily post discharge who describes his home as being "unlivable". Needs a nurse to reinforce teaching of disease process and to ensure he understands how to take his medications. Needs an aide to assist with ADLs. He is a Education officer, museum to assess home situation and provide information about community resources to  help niece care for her uncle.   The encounter with the patient was in whole, or in part, for the following medical condition, which is the primary reason for home health care:  Newly dx lung cancer, post obstructive PNA   I certify that, based on my findings, the following services are medically necessary home health services:  Nursing   Reason for Medically Necessary Home Health Services:   Skilled Nursing- Change/Decline in Patient Status Skilled Nursing- Changes in Medication/Medication Management Skilled Nursing- Skilled Assessment/Observation Skilled Nursing- Teaching of Disease Process/Symptom Management     My clinical findings support the need for the above services:  Pain interferes with ambulation/mobility   Further, I certify that my clinical findings support that this patient is homebound due to:  Pain interferes with ambulation/mobility   Home Health    Complete by:  As directed   To provide the following care/treatments:   [place] work     Increase activity slowly     Complete by:  As directed       Medication List    TAKE these medications   albuterol (2.5 MG/3ML) 0.083% nebulizer solution Commonly known as:  PROVENTIL Take 3 mLs (2.5 mg total) by nebulization every 6 (six) hours as needed for wheezing or shortness of breath.   benztropine 0.5 MG tablet Commonly known as:  COGENTIN Take 1 tablet (0.5 mg total) by mouth 2 (two) times daily. For prevention of drug induced tremors   FLUoxetine 20 MG capsule Commonly known as:  PROZAC Take 1 capsule (20 mg total) by mouth daily.   guaiFENesin 600 MG 12 hr tablet Commonly known as:  MUCINEX Take 1 tablet (600 mg total) by mouth 2 (two) times daily.   haloperidol 5 MG tablet Commonly known as:  HALDOL Take 1 tablet (5 mg total) by mouth 2 (two) times daily. For mood control   HYDROcodone-acetaminophen 5-325 MG tablet Commonly known as:  NORCO/VICODIN Take 1-2 tablets by mouth every 4 (four) hours as needed for moderate pain.   hydrOXYzine 25 MG tablet Commonly known as:  ATARAX/VISTARIL Take 1 tablet (25 mg) every 6 hours as needed: Foe anxiety/sleep   levofloxacin 750 MG tablet Commonly known as:  LEVAQUIN Take 1 tablet (750 mg total) by mouth daily.   multivitamin with minerals Tabs tablet Take 1 tablet by mouth daily. May purchase over the counter: As a Vitamin supplement   naproxen 500 MG EC tablet Commonly known as:  EC-NAPROSYN Take 1 tablet (500 mg total) by mouth 2 (two) times daily with a meal.   nicotine 21 mg/24hr patch Commonly known as:  NICODERM CQ - dosed in mg/24 hours Place 1 patch (21 mg total) onto the skin daily.   phosphorus 155-852-130 MG tablet Commonly known as:  K PHOS NEUTRAL Take 1 tablet (250 mg total) by mouth 2 (two) times daily. Start taking on:  11/07/2015   traZODone 100 MG tablet Commonly known as:  DESYREL Take 1 tablet (100 mg total) by mouth at bedtime. For insomnia      Follow-up Information    Eilleen Kempf., MD .   Specialty:   Oncology Why:  Office will call you with an appt time. Contact information: [place] [address] [phone]        Grant Fontana, MD .   Specialty:  [place] .   Why:  Nebulizer machine Contact  information: [address] [place] [address] [phone]            Time coordinating discharge: 35 minutes.  Signed:  Brenleigh Collet  Pager [phone] [facility] 11/06/2015, 3:03 PM   ADDENDUM:  Note, the patient was NOT discharged due to the development of significant RUE swelling.  A doppler could not be done prior to his discharge, so his discharge was postponed until 11/07/15 at such time that a doppler could be completed to rule out DVT.  Vanissa Strength 11/18/2015 3:25 PM

## 2015-11-07 ENCOUNTER — Inpatient Hospital Stay (HOSPITAL_COMMUNITY): Payer: Medicare Other

## 2015-11-07 DIAGNOSIS — M7989 Other specified soft tissue disorders: Secondary | ICD-10-CM | POA: Diagnosis present

## 2015-11-07 DIAGNOSIS — R918 Other nonspecific abnormal finding of lung field: Secondary | ICD-10-CM

## 2015-11-07 NOTE — Progress Notes (Signed)
Completd D/C teaching with patient and niece. Answered all questions. Gave prescriptions. Patient will be D/C home with family in stable condition.

## 2015-11-07 NOTE — Progress Notes (Addendum)
*  Preliminary Results* Right upper extremity venous duplex completed. Right upper extremity is negative for acute deep and superficial vein thrombosis. The right internal jugular vein exhibits rouleaux flow, cannot exclude future development of thrombus. Continuous flow is noted in the right upper extremity veins, suggestive of possible proximal obstruction/compression.  Incidental finding: the right superior chest has multiple heterogenous areas with eccentric vasculature exhibiting low-resistant arterial flow. Etiology unknown.  Preliminary results discussed with Catie, RN.  11/07/2015 8:40 AM  Maudry Mayhew, BS, RVT, RDCS, RDMS

## 2015-11-07 NOTE — Discharge Summary (Signed)
Physician Discharge Summary  Keith Murray DGL:[id number] DOB: 1961-01-19 DOA: 10/31/2015  PCP: Grant Fontana, MD (Inactive)  Admit date: 10/31/2015 Discharge date: 11/07/2015  Admitted From: Home Discharge disposition: Home (will be staying with his niece)   Recommendations for Outpatient Follow-Up:   1. The patient will follow-up with Dr. Julien Nordmann for treatment recommendations. 2. Home health nurse, social worker, and aide set up. 3. Radiation simulation performed with plans to initiate radiation therapy on 11/09/15.   Discharge Diagnosis:   Principal Problem:    Squamous cell carcinoma of lung, stage IV (HCC) Active Problems:    Polysubstance (excluding opioids) dependence (HCC)    Schizoaffective disorder, bipolar type (HCC)    Hypercalcemia of malignancy    Lung mass    Postobstructive pneumonia    Hypercalcemia    Leukocytosis    Hypophosphatemia    Cancer related pain    Right upper extremity swelling  Discharge Condition: Improved.  Diet recommendation:  Regular.   History of Present Illness:   Keith Murray is an 55 y.o. male with a PMH of cocaine/alcohol abuse, schizoaffective/bipolar disorder who was admitted 10/31/15 for evaluation of a right upper lung mass, elevated WBC and calcium and right chest pain. He was diagnosed with the mass back in May, but was lost to follow-up.  Hospital Course by Problem:   Principal Problem:   Postobstructive pneumonia in the setting of metastatic, poorly differentiated lung carcinoma/Leukocytosis/Cancer related pain Status post biopsy 11/02/15. Pathology confirms metastatic poorly differentiated squamous cell carcinoma. Status post 4 days of therapy with Zosyn which was narrowed to Levaquin 11/04/15.  Will complete a 7 day course of Levaquin 11/07/15. Dr. Julien Nordmann will arrange for outpatient follow-up. Treatment recommendations are pending molecular studies. Discharge home on Naprosyn 500 mg every 12 hours and hydrocodone  1-2 tabs every 4 hours as needed for breakthrough pain.Patient has been seen by radiation oncology with simulation performed 11/06/15 and plans to initiate therapy on 11/09/15.    Hypercalcemia of malignancy Treated with IV fluids. Status post pamidronate. Calcium trending down.   Active Problems:   Right upper extremity swelling The patient developed worsening right upper extremity swelling prior to discharge so his discharge was held to obtain a Doppler. This could not be done 11/06/15. Dopplers were subsequently done 11/07/15 and were negative for DVT. The patient does have significant compressive disease from lung mass and is at risk for VTE and circulatory compromise/SVC syndrome.    Hyperkalemia Given a dose of Kayexalate 15 g 11/05/15 with return of potassium to normal.    Hypophosphatemia We'll give additional phosphorus supplementation today, phosphorus still low, but improving. We'll also discharge home on routine oral supplementation with Neutra-Phos.    Polysubstance (excluding opioids) dependence ([place]) Urine toxicology screen was negative on admission. IV narcotics were avoided.    Schizoaffective disorder, bipolar type (HCC) Continue Prozac, Haldol and Cogentin. Continue Vistaril as needed for anxiety.   Medical Consultants:    Oncology  Radiation Oncology   Discharge Exam:   Vitals:   11/06/15 2039 11/07/15 0608  BP: 117/79 116/72  Pulse: (!) 110 91  Resp: 20 20  Temp: 99.3 F (37.4 C) 98.2 F (36.8 C)   Vitals:   11/06/15 2039 11/06/15 2116 11/07/15 0608 11/07/15 1001  BP: 117/79  116/72   Pulse: (!) 110  91   Resp: 20  20   Temp: 99.3 F (37.4 C)  98.2 F (36.8 C)   TempSrc: Oral  Oral   SpO2: 100% 96% 94%  94%  Weight:      Height:        General exam: Appears calm and comfortable. Respiratory system: Clear to auscultation, but diminished. Respiratory effort normal. Cardiovascular system: S1 & S2 [place], RRR. No JVD,  rubs, gallops or clicks.  No murmurs. Gastrointestinal system: Abdomen is nondistended, soft and nontender. No organomegaly or masses felt. Normal bowel sounds [place]. Central nervous system: Alert and oriented. No focal neurological deficits. Extremities: No clubbing,  or cyanosis. No edema. Skin: No rashes, lesions or ulcers. Psychiatry: Judgement and insight appear diminished. Mood & affect less irritable.    The results of significant diagnostics from this hospitalization (including imaging, microbiology, ancillary and laboratory) are listed below for reference.     Procedures and Diagnostic Studies:   Right upper extremity Doppler 11/07/15 Summary: Right upper extremity is negative for acute deep and superficial vein thrombosis. The right internal jugular vein exhibits rouleaux flow, cannot exclude future development of thrombus. Continuous flow is noted in the right upper extremity veins, suggestive of possible proximal obstruction/compression.  Incidental finding: the right superior chest has multiple heterogenous areas with eccentric vasculature exhibiting low-resistant arterial flow. Etiology unknown.   Labs:   Basic Metabolic Panel:  Recent Labs Lab 11/01/15 0531 11/02/15 0512 11/03/15 0354 11/04/15 1009 11/05/15 0503 11/06/15 0519  NA 135 136 136 132* 135 137  K 3.7 3.9 4.1 4.7 5.2* 4.8  CL 110 113* 111 110 112* 112*  CO2 21* 18* 19* 17* 20* 20*  GLUCOSE 95 85 100* 105* 98 95  BUN '13 11 10 10 14 14  '$ CREATININE 1.06 1.11 1.09 1.10 1.00 1.06  CALCIUM 11.0* 10.7* 10.4* 10.6* 10.8* 10.2  MG 1.8  --   --   --   --   --   PHOS 1.6*  --  1.4* 1.8* 1.7* 2.4*   GFR Estimated Creatinine Clearance: 92.6 mL/min (by C-G formula based on SCr of 1.06 mg/dL). Liver Function Tests:  Recent Labs Lab 11/01/15 0531 11/02/15 0512 11/03/15 0354  AST 11* 9* 10*  ALT 7* 7* 6*  ALKPHOS 172* 158* 172*  BILITOT 0.6 0.3 0.6  PROT 6.1* 6.1* 6.3*  ALBUMIN 2.6* 2.5* 2.6*   No results for input(s):  LIPASE, AMYLASE in the last 168 hours. Coagulation profile  Recent Labs Lab 11/01/15 0531  INR 1.13    CBC:  Recent Labs Lab 10/31/15 1906 11/01/15 0531 11/02/15 0512 11/03/15 0354  WBC 45.5* 40.0* 39.4* 39.1*  NEUTROABS 41.4*  --  35.8* 35.5*  HGB 9.9* 8.8* 9.2* 9.0*  HCT 29.6* 27.1* 28.3* 28.2*  MCV 85.5 86.0 86.8 86.2  PLT 438* 405* 424* 434*     Discharge Instructions:   Discharge Instructions    Call MD for:  persistant dizziness or light-headedness    Complete by:  As directed   Call MD for:  persistant nausea and vomiting    Complete by:  As directed   Call MD for:  severe uncontrolled pain    Complete by:  As directed   Call MD for:  temperature >100.4    Complete by:  As directed   DME Nebulizer machine    Complete by:  As directed   Diet - low sodium heart healthy    Complete by:  As directed   Face-to-face encounter (required for Medicare/Medicaid patients)    Complete by:  As directed   I Kamyra Schroeck certify that this patient is under my care and that I, or a nurse practitioner or  85 assistant working with me, had a face-to-face encounter that meets the physician face-to-face encounter requirements with this patient on 11/06/2015. The encounter with the patient was in whole, or in part for the following medical condition(s) which is the primary reason for home health care (List medical condition): Patient has newly diagnosed lung cancer, limited social support. He will live with his niece temporarily post discharge who describes his home as being "unlivable". Needs a nurse to reinforce teaching of disease process and to ensure he understands how to take his medications. Needs an aide to assist with ADLs. He is a Education officer, museum to assess home situation and provide information about community resources to help niece care for her uncle.   The encounter with the patient was in whole, or in part, for the following medical condition, which is the primary reason  for home health care:  Newly dx lung cancer, post obstructive PNA   I certify that, based on my findings, the following services are medically necessary home health services:  Nursing   Reason for Medically Necessary Home Health Services:   Skilled Nursing- Change/Decline in Patient Status Skilled Nursing- Changes in Medication/Medication Management Skilled Nursing- Skilled Assessment/Observation Skilled Nursing- Teaching of Disease Process/Symptom Management     My clinical findings support the need for the above services:  Pain interferes with ambulation/mobility   Further, I certify that my clinical findings support that this patient is homebound due to:  Pain interferes with ambulation/mobility   Home Health    Complete by:  As directed   To provide the following care/treatments:   [place] work     Increase activity slowly    Complete by:  As directed       Medication List    TAKE these medications   albuterol (2.5 MG/3ML) 0.083% nebulizer solution Commonly known as:  PROVENTIL Take 3 mLs (2.5 mg total) by nebulization every 6 (six) hours as needed for wheezing or shortness of breath.   benztropine 0.5 MG tablet Commonly known as:  COGENTIN Take 1 tablet (0.5 mg total) by mouth 2 (two) times daily. For prevention of drug induced tremors   FLUoxetine 20 MG capsule Commonly known as:  PROZAC Take 1 capsule (20 mg total) by mouth daily.   guaiFENesin 600 MG 12 hr tablet Commonly known as:  MUCINEX Take 1 tablet (600 mg total) by mouth 2 (two) times daily.   haloperidol 5 MG tablet Commonly known as:  HALDOL Take 1 tablet (5 mg total) by mouth 2 (two) times daily. For mood control   HYDROcodone-acetaminophen 5-325 MG tablet Commonly known as:  NORCO/VICODIN Take 1-2 tablets by mouth every 4 (four) hours as needed for moderate pain.   hydrOXYzine 25 MG tablet Commonly known as:  ATARAX/VISTARIL Take 1 tablet (25 mg) every 6 hours as needed: Foe  anxiety/sleep   levofloxacin 750 MG tablet Commonly known as:  LEVAQUIN Take 1 tablet (750 mg total) by mouth daily.   multivitamin with minerals Tabs tablet Take 1 tablet by mouth daily. May purchase over the counter: As a Vitamin supplement   naproxen 500 MG EC tablet Commonly known as:  EC-NAPROSYN Take 1 tablet (500 mg total) by mouth 2 (two) times daily with a meal.   nicotine 21 mg/24hr patch Commonly known as:  NICODERM CQ - dosed in mg/24 hours Place 1 patch (21 mg total) onto the skin daily.   phosphorus 155-852-130 MG tablet Commonly known as:  K PHOS NEUTRAL  Take 1 tablet (250 mg total) by mouth 2 (two) times daily.   traZODone 100 MG tablet Commonly known as:  DESYREL Take 1 tablet (100 mg total) by mouth at bedtime. For insomnia      Follow-up Information    Eilleen Kempf., MD .   Specialty:  Oncology Why:  Office will call you with an appt time. Contact information: [place] [address] [phone]        Grant Fontana, MD .   Specialty:  [place] .   WhySystems developer information: [address] [place] [address] [place] .   Why:  Home nurse,social work, nurse's Careers adviser information: [address] [place] [address] [phone]        [place] .   Contact information: [place] [address] [phone]            Time coordinating discharge: 35 minutes.  Signed:  Jeron Murray  Pager [phone] [facility] 11/07/2015, 2:45 PM

## 2015-11-07 NOTE — Progress Notes (Signed)
LCSWA met with patient at bedside and explained reason for consult. Patient agreeable to talk. Pt. Presented smiling, alert and oriented. Patient reports he was mad at sister about trashing his home and then moved in with his niece. Patient reports, "she treats me well, I was just mad at her yesterday." LCSWA inquired about patient having to do yard work, patient reports helps around the house when he can. Patient reports is not mistreated at home. Lead educated and inform patient how to contact DSS if he is ever is harmed or mistreated. Patient receptive to information.   Kathrin Greathouse, Latanya Presser, MSW Clinical Social Worker 5E and Psychiatric Service Line [phone] 11/07/2015  10:35 AM

## 2015-11-09 ENCOUNTER — Ambulatory Visit
Admission: RE | Admit: 2015-11-09 | Discharge: 2015-11-09 | Disposition: A | Discharge status: Home / Self Care | Payer: Medicare Other | Source: Ambulatory Visit | Attending: Radiation Oncology | Admitting: Radiation Oncology

## 2015-11-09 DIAGNOSIS — C3411 Malignant neoplasm of upper lobe, right bronchus or lung: Secondary | ICD-10-CM | POA: Diagnosis not present

## 2015-11-09 DIAGNOSIS — C7989 Secondary malignant neoplasm of other specified sites: Secondary | ICD-10-CM | POA: Diagnosis not present

## 2015-11-09 DIAGNOSIS — Z79899 Other long term (current) drug therapy: Secondary | ICD-10-CM | POA: Diagnosis not present

## 2015-11-09 DIAGNOSIS — Z510 Encounter for antineoplastic radiation therapy: Secondary | ICD-10-CM | POA: Diagnosis not present

## 2015-11-10 ENCOUNTER — Ambulatory Visit: Admission: RE | Admit: 2015-11-10 | Payer: 59 | Source: Ambulatory Visit

## 2015-11-10 ENCOUNTER — Ambulatory Visit
Admission: RE | Admit: 2015-11-10 | Discharge: 2015-11-10 | Disposition: A | Discharge status: Home / Self Care | Payer: Medicare Other | Source: Ambulatory Visit | Attending: Radiation Oncology | Admitting: Radiation Oncology

## 2015-11-10 DIAGNOSIS — Z510 Encounter for antineoplastic radiation therapy: Secondary | ICD-10-CM | POA: Diagnosis not present

## 2015-11-10 DIAGNOSIS — C3411 Malignant neoplasm of upper lobe, right bronchus or lung: Secondary | ICD-10-CM | POA: Diagnosis not present

## 2015-11-10 DIAGNOSIS — C3490 Malignant neoplasm of unspecified part of unspecified bronchus or lung: Secondary | ICD-10-CM | POA: Diagnosis not present

## 2015-11-10 DIAGNOSIS — Z79899 Other long term (current) drug therapy: Secondary | ICD-10-CM | POA: Diagnosis not present

## 2015-11-10 DIAGNOSIS — Z79891 Long term (current) use of opiate analgesic: Secondary | ICD-10-CM | POA: Diagnosis not present

## 2015-11-10 DIAGNOSIS — C7989 Secondary malignant neoplasm of other specified sites: Secondary | ICD-10-CM | POA: Diagnosis not present

## 2015-11-11 ENCOUNTER — Ambulatory Visit
Admission: RE | Admit: 2015-11-11 | Discharge: 2015-11-11 | Disposition: A | Discharge status: Home / Self Care | Payer: 59 | Source: Ambulatory Visit | Attending: Radiation Oncology | Admitting: Radiation Oncology

## 2015-11-11 ENCOUNTER — Ambulatory Visit
Admission: RE | Admit: 2015-11-11 | Discharge: 2015-11-11 | Disposition: A | Discharge status: Home / Self Care | Payer: Medicare Other | Source: Ambulatory Visit | Attending: Radiation Oncology | Admitting: Radiation Oncology

## 2015-11-11 DIAGNOSIS — C7989 Secondary malignant neoplasm of other specified sites: Secondary | ICD-10-CM | POA: Diagnosis not present

## 2015-11-11 DIAGNOSIS — Z510 Encounter for antineoplastic radiation therapy: Secondary | ICD-10-CM | POA: Diagnosis not present

## 2015-11-11 DIAGNOSIS — C3411 Malignant neoplasm of upper lobe, right bronchus or lung: Secondary | ICD-10-CM | POA: Diagnosis not present

## 2015-11-11 DIAGNOSIS — C3491 Malignant neoplasm of unspecified part of right bronchus or lung: Secondary | ICD-10-CM

## 2015-11-11 DIAGNOSIS — Z79899 Other long term (current) drug therapy: Secondary | ICD-10-CM | POA: Diagnosis not present

## 2015-11-11 DIAGNOSIS — C3490 Malignant neoplasm of unspecified part of unspecified bronchus or lung: Secondary | ICD-10-CM

## 2015-11-11 DIAGNOSIS — Z79891 Long term (current) use of opiate analgesic: Secondary | ICD-10-CM | POA: Diagnosis not present

## 2015-11-11 MED ORDER — SONAFINE EX EMUL: 1.0000 "application " | Freq: Two times a day (BID) | CUTANEOUS | Status: DC

## 2015-11-11 MED ORDER — SONAFINE EX EMUL
1.0000 "application " | Freq: Two times a day (BID) | CUTANEOUS | Status: DC
  Administered 2015-11-11 (×2): 1 via TOPICAL

## 2015-11-12 ENCOUNTER — Ambulatory Visit
Admission: RE | Admit: 2015-11-12 | Discharge: 2015-11-12 | Disposition: A | Discharge status: Home / Self Care | Payer: Medicare Other | Source: Ambulatory Visit | Attending: Radiation Oncology | Admitting: Radiation Oncology

## 2015-11-12 DIAGNOSIS — C3411 Malignant neoplasm of upper lobe, right bronchus or lung: Secondary | ICD-10-CM | POA: Diagnosis not present

## 2015-11-12 DIAGNOSIS — C3490 Malignant neoplasm of unspecified part of unspecified bronchus or lung: Secondary | ICD-10-CM | POA: Diagnosis not present

## 2015-11-12 DIAGNOSIS — Z510 Encounter for antineoplastic radiation therapy: Secondary | ICD-10-CM | POA: Diagnosis not present

## 2015-11-12 DIAGNOSIS — C7989 Secondary malignant neoplasm of other specified sites: Secondary | ICD-10-CM | POA: Diagnosis not present

## 2015-11-12 DIAGNOSIS — Z79899 Other long term (current) drug therapy: Secondary | ICD-10-CM | POA: Diagnosis not present

## 2015-11-12 DIAGNOSIS — Z79891 Long term (current) use of opiate analgesic: Secondary | ICD-10-CM | POA: Diagnosis not present

## 2015-11-13 ENCOUNTER — Ambulatory Visit
Admission: RE | Admit: 2015-11-13 | Discharge: 2015-11-13 | Disposition: A | Discharge status: Home / Self Care | Payer: Medicare Other | Source: Ambulatory Visit | Attending: Radiation Oncology | Admitting: Radiation Oncology

## 2015-11-13 ENCOUNTER — Ambulatory Visit
Admission: RE | Admit: 2015-11-13 | Discharge: 2015-11-13 | Disposition: A | Discharge status: Home / Self Care | Payer: 59 | Source: Ambulatory Visit | Attending: Radiation Oncology | Admitting: Radiation Oncology

## 2015-11-13 ENCOUNTER — Encounter: Payer: Self-pay | Admitting: Radiation Oncology

## 2015-11-13 VITALS — BP 113/72 | HR 100 | Temp 99.5°F | Resp 20

## 2015-11-13 DIAGNOSIS — C3411 Malignant neoplasm of upper lobe, right bronchus or lung: Secondary | ICD-10-CM

## 2015-11-13 DIAGNOSIS — C7989 Secondary malignant neoplasm of other specified sites: Secondary | ICD-10-CM | POA: Diagnosis not present

## 2015-11-13 DIAGNOSIS — Z510 Encounter for antineoplastic radiation therapy: Secondary | ICD-10-CM | POA: Diagnosis not present

## 2015-11-13 DIAGNOSIS — Z79899 Other long term (current) drug therapy: Secondary | ICD-10-CM | POA: Diagnosis not present

## 2015-11-13 NOTE — Progress Notes (Signed)
  Radiation Oncology         [phone] ________________________________  Name: Keith Murray MRN: [id number]  Date: 11/06/2015  DOB: [birth year]  SIMULATION AND TREATMENT PLANNING NOTE  DIAGNOSIS:     ICD-9-CM ICD-10-CM   1. Malignant neoplasm of right upper lobe of lung (HCC) 162.3 C34.11      Site:   1. Right upper lung 2. Abdominal subcutaneous metastasis  NARRATIVE:  The patient was brought to the [facility].  Identity was confirmed.  All relevant records and images related to the planned course of therapy were reviewed.   Written consent to proceed with treatment was confirmed which was freely given after reviewing the details related to the planned course of therapy had been reviewed with the patient.  Then, the patient was set-up in a stable reproducible  supine position for radiation therapy.  CT images were obtained.  Surface markings were placed.    Medically necessary complex treatment device(s) for immobilization:  Customized vac lock bag.   The CT images were loaded into the planning software.  Then the target and avoidance structures were contoured.  Treatment planning then occurred.  The radiation prescription was entered and confirmed.  A total of 4 complex treatment devices were fabricated which relate to the designed radiation treatment fields: A 3 field technique will be utilized to treat the right lung tumor and a single en face electron field will be used to treat the abdominal subcutaneous nodule . Each of these customized fields/ complex treatment devices will be used on a daily basis during the radiation course. I have requested : 3D Simulation  I have requested a DVH of the following structures: Tumor volume, lungs, spinal cord.   The patient will undergo daily image guidance to ensure accurate localization of the target, and adequate minimize dose to the normal surrounding structures in close proximity to the target.   PLAN:  The patient will  receive 30  Gy in 10  Fractions to both of these target regions.  ________________________________   Jodelle Gross, MD, PhD

## 2015-11-13 NOTE — Progress Notes (Addendum)
Weekly rad txs 5/10 right lung and abdomen, pain, no nuasea, bowels regular, no coughing, pain 8/10 scale right upper chest near shoulder and abdomen, no gas no difficulty swallowing,  Offered cola, ,appetite good 12:36 PM BP 113/72 (BP Location: Left Arm, Patient Position: Sitting, Cuff Size: Normal)   Pulse 100   Temp 99.5 F (37.5 C) (Oral)   Resp 20   Wt 157 lb 11.2 oz (71.5 kg)   SpO2 100%   BMI 20.25 kg/m   Re weighed patient  on scale =162.0lb, I think scale is off Wt Readings from Last 3 Encounters:  10/31/15 182 lb 15.7 oz (83 kg)  08/07/15 195 lb (88.5 kg)  07/27/15 154 lb (69.9 kg)

## 2015-11-13 NOTE — Progress Notes (Signed)
Department of Radiation Oncology  Phone:  [phone] Fax:        [phone]  Weekly Treatment Note    Name: Keith Murray Date: 11/13/2015 MRN: [id number] DOB: [birth year]   Diagnosis:     ICD-9-CM ICD-10-CM   1. Malignant neoplasm of right upper lobe of lung (HCC) 162.3 C34.11      Current dose: 15 Gy  Current fraction: 5   MEDICATIONS: Current Outpatient Prescriptions  Medication Sig Dispense Refill  . albuterol (PROVENTIL) (2.5 MG/3ML) 0.083% nebulizer solution Take 3 mLs (2.5 mg total) by nebulization every 6 (six) hours as needed for wheezing or shortness of breath. 75 mL 12  . benztropine (COGENTIN) 0.5 MG tablet Take 1 tablet (0.5 mg total) by mouth 2 (two) times daily. For prevention of drug induced tremors 60 tablet 0  . FLUoxetine (PROZAC) 20 MG capsule Take 1 capsule (20 mg total) by mouth daily. 30 capsule 3  . guaiFENesin (MUCINEX) 600 MG 12 hr tablet Take 1 tablet (600 mg total) by mouth 2 (two) times daily. 60 tablet 2  . haloperidol (HALDOL) 5 MG tablet Take 1 tablet (5 mg total) by mouth 2 (two) times daily. For mood control 60 tablet 0  . HYDROcodone-acetaminophen (NORCO/VICODIN) 5-325 MG tablet Take 1-2 tablets by mouth every 4 (four) hours as needed for moderate pain. 30 tablet 0  . hydrOXYzine (ATARAX/VISTARIL) 25 MG tablet Take 1 tablet (25 mg) every 6 hours as needed: Foe anxiety/sleep 60 tablet 0  . levofloxacin (LEVAQUIN) 750 MG tablet Take 1 tablet (750 mg total) by mouth daily. 1 tablet 0  . Multiple Vitamin (MULTIVITAMIN WITH MINERALS) TABS tablet Take 1 tablet by mouth daily. May purchase over the counter: As a Vitamin supplement    . naproxen (EC-NAPROSYN) 500 MG EC tablet Take 1 tablet (500 mg total) by mouth 2 (two) times daily with a meal. 60 tablet 2  . nicotine (NICODERM CQ - DOSED IN MG/24 HOURS) 21 mg/24hr patch Place 1 patch (21 mg total) onto the skin daily. 28 patch 0  . phosphorus (K PHOS NEUTRAL) 155-852-130 MG tablet Take 1 tablet  (250 mg total) by mouth 2 (two) times daily. 60 tablet 0  . traZODone (DESYREL) 100 MG tablet Take 1 tablet (100 mg total) by mouth at bedtime. For insomnia 30 tablet 0  . Wound Dressings (SONAFINE) Apply 1 application topically 2 (two) times daily.     No current facility-administered medications for this encounter.      ALLERGIES: Review of patient's allergies indicates no known allergies.   LABORATORY DATA:  Lab Results  Component Value Date   WBC 39.1 (H) 11/03/2015   HGB 9.0 (L) 11/03/2015   HCT 28.2 (L) 11/03/2015   MCV 86.2 11/03/2015   PLT 434 (H) 11/03/2015   Lab Results  Component Value Date   NA 137 11/06/2015   K 4.8 11/06/2015   CL 112 (H) 11/06/2015   CO2 20 (L) 11/06/2015   Lab Results  Component Value Date   ALT 6 (L) 11/03/2015   AST 10 (L) 11/03/2015   ALKPHOS 172 (H) 11/03/2015   BILITOT 0.6 11/03/2015     NARRATIVE: Milton Ferguson was seen today for weekly treatment management. The chart was checked and the patient's films were reviewed.  Weekly rad txs 5/10 right lung and abdomen, pain, no nuasea, bowels regular, no coughing, pain 8/10 scale right upper chest near shoulder and abdomen, no gas no difficulty swallowing,  Offered cola, ,appetite good 1:51 PM  BP 113/72 (BP Location: Left Arm, Patient Position: Sitting, Cuff Size: Normal)   Pulse 100   Temp 99.5 F (37.5 C) (Oral)   Resp 20   SpO2 100%   Re weighed patient  on scale =162.0lb, I think scale is off Wt Readings from Last 3 Encounters:  10/31/15 182 lb 15.7 oz (83 kg)  08/07/15 195 lb (88.5 kg)  07/27/15 154 lb (69.9 kg)     PHYSICAL EXAMINATION: oral temperature is 99.5 F (37.5 C). His blood pressure is 113/72 and his pulse is 100. His respiration is 20 and oxygen saturation is 100%.        ASSESSMENT: The patient is doing satisfactorily with treatment.  PLAN: We will continue with the patient's radiation treatment as planned.

## 2015-11-16 ENCOUNTER — Ambulatory Visit
Admission: RE | Admit: 2015-11-16 | Discharge: 2015-11-16 | Disposition: A | Discharge status: Home / Self Care | Payer: Medicare Other | Source: Ambulatory Visit | Attending: Radiation Oncology | Admitting: Radiation Oncology

## 2015-11-16 ENCOUNTER — Encounter: Payer: Self-pay | Admitting: *Deleted

## 2015-11-16 DIAGNOSIS — C3411 Malignant neoplasm of upper lobe, right bronchus or lung: Secondary | ICD-10-CM | POA: Diagnosis not present

## 2015-11-16 DIAGNOSIS — C7989 Secondary malignant neoplasm of other specified sites: Secondary | ICD-10-CM | POA: Diagnosis not present

## 2015-11-16 DIAGNOSIS — Z79899 Other long term (current) drug therapy: Secondary | ICD-10-CM | POA: Diagnosis not present

## 2015-11-16 DIAGNOSIS — Z510 Encounter for antineoplastic radiation therapy: Secondary | ICD-10-CM | POA: Diagnosis not present

## 2015-11-16 NOTE — Progress Notes (Deleted)
nausea and vomiting

## 2015-11-16 NOTE — Progress Notes (Signed)
Oncology Nurse Navigator Documentation  Oncology Nurse Navigator Flowsheets 11/16/2015  Navigator Encounter Type Other/per Dr. Julien Nordmann, I requested PDL 1 testing to be completed.  I notified pathology dept.  I will notify scheduling to schedule a follow up appt with Dr. Julien Nordmann.   Treatment Phase Treatment  Barriers/Navigation Needs Coordination of Care  Interventions Coordination of Care  Coordination of Care Other;Appts  Acuity Level 2  Acuity Level 2 Other;Assistance expediting appointments  Time Spent with Patient 30

## 2015-11-17 ENCOUNTER — Encounter (HOSPITAL_COMMUNITY): Payer: Self-pay

## 2015-11-17 ENCOUNTER — Ambulatory Visit
Admission: RE | Admit: 2015-11-17 | Discharge: 2015-11-17 | Disposition: A | Discharge status: Home / Self Care | Payer: Medicare Other | Source: Ambulatory Visit | Attending: Radiation Oncology | Admitting: Radiation Oncology

## 2015-11-17 DIAGNOSIS — C3490 Malignant neoplasm of unspecified part of unspecified bronchus or lung: Secondary | ICD-10-CM | POA: Diagnosis not present

## 2015-11-17 DIAGNOSIS — Z79899 Other long term (current) drug therapy: Secondary | ICD-10-CM | POA: Diagnosis not present

## 2015-11-17 DIAGNOSIS — C3411 Malignant neoplasm of upper lobe, right bronchus or lung: Secondary | ICD-10-CM | POA: Diagnosis not present

## 2015-11-17 DIAGNOSIS — Z510 Encounter for antineoplastic radiation therapy: Secondary | ICD-10-CM | POA: Diagnosis not present

## 2015-11-17 DIAGNOSIS — C7989 Secondary malignant neoplasm of other specified sites: Secondary | ICD-10-CM | POA: Diagnosis not present

## 2015-11-17 DIAGNOSIS — Z79891 Long term (current) use of opiate analgesic: Secondary | ICD-10-CM | POA: Diagnosis not present

## 2015-11-18 ENCOUNTER — Ambulatory Visit
Admission: RE | Admit: 2015-11-18 | Discharge: 2015-11-18 | Disposition: A | Discharge status: Home / Self Care | Payer: Medicare Other | Source: Ambulatory Visit | Attending: Radiation Oncology | Admitting: Radiation Oncology

## 2015-11-18 ENCOUNTER — Telehealth: Payer: Self-pay | Admitting: Internal Medicine

## 2015-11-18 DIAGNOSIS — C7989 Secondary malignant neoplasm of other specified sites: Secondary | ICD-10-CM | POA: Diagnosis not present

## 2015-11-18 DIAGNOSIS — Z510 Encounter for antineoplastic radiation therapy: Secondary | ICD-10-CM | POA: Diagnosis not present

## 2015-11-18 DIAGNOSIS — Z79899 Other long term (current) drug therapy: Secondary | ICD-10-CM | POA: Diagnosis not present

## 2015-11-18 DIAGNOSIS — C3411 Malignant neoplasm of upper lobe, right bronchus or lung: Secondary | ICD-10-CM | POA: Diagnosis not present

## 2015-11-18 NOTE — Telephone Encounter (Signed)
APPT SCHD PER 08/21 SCHD MSG. CALLED PATIENT TO CONF APPT. LEFT V/M. APPT LTR & SCHD MAILED. 11/18/15

## 2015-11-19 ENCOUNTER — Ambulatory Visit
Admission: RE | Admit: 2015-11-19 | Discharge: 2015-11-19 | Disposition: A | Discharge status: Home / Self Care | Payer: Medicare Other | Source: Ambulatory Visit | Attending: Radiation Oncology | Admitting: Radiation Oncology

## 2015-11-19 DIAGNOSIS — Z79891 Long term (current) use of opiate analgesic: Secondary | ICD-10-CM | POA: Diagnosis not present

## 2015-11-19 DIAGNOSIS — C7989 Secondary malignant neoplasm of other specified sites: Secondary | ICD-10-CM | POA: Diagnosis not present

## 2015-11-19 DIAGNOSIS — C3411 Malignant neoplasm of upper lobe, right bronchus or lung: Secondary | ICD-10-CM | POA: Diagnosis not present

## 2015-11-19 DIAGNOSIS — Z79899 Other long term (current) drug therapy: Secondary | ICD-10-CM | POA: Diagnosis not present

## 2015-11-19 DIAGNOSIS — C3490 Malignant neoplasm of unspecified part of unspecified bronchus or lung: Secondary | ICD-10-CM | POA: Diagnosis not present

## 2015-11-19 DIAGNOSIS — Z510 Encounter for antineoplastic radiation therapy: Secondary | ICD-10-CM | POA: Diagnosis not present

## 2015-11-20 ENCOUNTER — Ambulatory Visit
Admission: RE | Admit: 2015-11-20 | Discharge: 2015-11-20 | Disposition: A | Discharge status: Home / Self Care | Payer: Medicare Other | Source: Ambulatory Visit | Attending: Radiation Oncology | Admitting: Radiation Oncology

## 2015-11-20 ENCOUNTER — Encounter: Payer: Self-pay | Admitting: Radiation Oncology

## 2015-11-20 ENCOUNTER — Ambulatory Visit
Admission: RE | Admit: 2015-11-20 | Discharge: 2015-11-20 | Disposition: A | Discharge status: Home / Self Care | Payer: 59 | Source: Ambulatory Visit | Attending: Radiation Oncology | Admitting: Radiation Oncology

## 2015-11-20 VITALS — BP 125/83 | HR 114 | Temp 98.4°F | Resp 20 | Wt 167.5 lb

## 2015-11-20 DIAGNOSIS — Z79899 Other long term (current) drug therapy: Secondary | ICD-10-CM | POA: Diagnosis not present

## 2015-11-20 DIAGNOSIS — C3411 Malignant neoplasm of upper lobe, right bronchus or lung: Secondary | ICD-10-CM

## 2015-11-20 DIAGNOSIS — Z510 Encounter for antineoplastic radiation therapy: Secondary | ICD-10-CM | POA: Diagnosis not present

## 2015-11-20 DIAGNOSIS — C7989 Secondary malignant neoplasm of other specified sites: Secondary | ICD-10-CM | POA: Diagnosis not present

## 2015-11-20 NOTE — Progress Notes (Signed)
Weekly rad txs abdomen right wing10/10 completed, no c/o pain, nausea, no diarrhea, bowels normal, bladder normal, eating well, room air sats =98% ,  1 month f/u appt with Shona Simpson PA next month BP 125/83 (BP Location: Left Arm, Patient Position: Sitting, Cuff Size: Normal)   Pulse (!) 114   Temp 98.4 F (36.9 C) (Oral)   Resp 20   Wt 167 lb 8 oz (76 kg)   SpO2 98% Comment: room air  BMI 21.51 kg/m   Wt Readings from Last 3 Encounters:  11/20/15 167 lb 8 oz (76 kg)  10/31/15 182 lb 15.7 oz (83 kg)  08/07/15 195 lb (88.5 kg)

## 2015-11-20 NOTE — Progress Notes (Signed)
Department of Radiation Oncology  Phone:  [phone] Fax:        [phone]  Weekly Treatment Note    Name: Keith Murray Date: 11/22/2015 MRN: [id number] DOB: 08-06-1960   Current dose: 30 Gy  Current fraction:10   MEDICATIONS: Current Outpatient Prescriptions  Medication Sig Dispense Refill  . albuterol (PROVENTIL) (2.5 MG/3ML) 0.083% nebulizer solution Take 3 mLs (2.5 mg total) by nebulization every 6 (six) hours as needed for wheezing or shortness of breath. 75 mL 12  . benztropine (COGENTIN) 0.5 MG tablet Take 1 tablet (0.5 mg total) by mouth 2 (two) times daily. For prevention of drug induced tremors 60 tablet 0  . FLUoxetine (PROZAC) 20 MG capsule Take 1 capsule (20 mg total) by mouth daily. 30 capsule 3  . guaiFENesin (MUCINEX) 600 MG 12 hr tablet Take 1 tablet (600 mg total) by mouth 2 (two) times daily. 60 tablet 2  . haloperidol (HALDOL) 5 MG tablet Take 1 tablet (5 mg total) by mouth 2 (two) times daily. For mood control 60 tablet 0  . HYDROcodone-acetaminophen (NORCO/VICODIN) 5-325 MG tablet Take 1-2 tablets by mouth every 4 (four) hours as needed for moderate pain. 30 tablet 0  . hydrOXYzine (ATARAX/VISTARIL) 25 MG tablet Take 1 tablet (25 mg) every 6 hours as needed: Foe anxiety/sleep 60 tablet 0  . levofloxacin (LEVAQUIN) 750 MG tablet Take 1 tablet (750 mg total) by mouth daily. 1 tablet 0  . Multiple Vitamin (MULTIVITAMIN WITH MINERALS) TABS tablet Take 1 tablet by mouth daily. May purchase over the counter: As a Vitamin supplement    . naproxen (EC-NAPROSYN) 500 MG EC tablet Take 1 tablet (500 mg total) by mouth 2 (two) times daily with a meal. 60 tablet 2  . nicotine (NICODERM CQ - DOSED IN MG/24 HOURS) 21 mg/24hr patch Place 1 patch (21 mg total) onto the skin daily. 28 patch 0  . phosphorus (K PHOS NEUTRAL) 155-852-130 MG tablet Take 1 tablet (250 mg total) by mouth 2 (two) times daily. 60 tablet 0  . traZODone (DESYREL) 100 MG tablet Take 1 tablet (100 mg  total) by mouth at bedtime. For insomnia 30 tablet 0  . Wound Dressings (SONAFINE) Apply 1 application topically 2 (two) times daily.     No current facility-administered medications for this encounter.      ALLERGIES: Review of patient's allergies indicates no known allergies.   LABORATORY DATA:  Lab Results  Component Value Date   WBC 39.1 (H) 11/03/2015   HGB 9.0 (L) 11/03/2015   HCT 28.2 (L) 11/03/2015   MCV 86.2 11/03/2015   PLT 434 (H) 11/03/2015   Lab Results  Component Value Date   NA 137 11/06/2015   K 4.8 11/06/2015   CL 112 (H) 11/06/2015   CO2 20 (L) 11/06/2015   Lab Results  Component Value Date   ALT 6 (L) 11/03/2015   AST 10 (L) 11/03/2015   ALKPHOS 172 (H) 11/03/2015   BILITOT 0.6 11/03/2015     NARRATIVE: Keith Murray was seen today for weekly treatment management. The chart was checked and the patient's films were reviewed.  Weekly rad txs abdomen right wing 10/10 completed. He denies pain, nausea, or diarrhea. Normal bowels and bladder. Eating well.  PHYSICAL EXAMINATION: weight is 167 lb 8 oz (76 kg). His oral temperature is 98.4 F (36.9 C). His blood pressure is 125/83 and his pulse is 114 (abnormal). His respiration is 20 and oxygen saturation is 98%.   ASSESSMENT: The patient did  satisfactorily with treatment.  PLAN: The patient will follow-up in our clinic in 1 month.  This document serves as a record of services personally performed by Kyung Rudd, MD. It was created on his behalf by Darcus Austin, a trained medical scribe. The creation of this record is based on the scribe's personal observations and the provider's statements to them. This document has been checked and approved by the attending provider.

## 2015-11-25 ENCOUNTER — Encounter: Payer: Self-pay | Admitting: Radiation Oncology

## 2015-11-25 NOTE — Progress Notes (Signed)
  Radiation Oncology         [phone] ________________________________  Name: Rue Tinnel MRN: [id number]  Date: 11/25/2015  DOB: [birth year]  End of Treatment Note  Diagnosis:   Malignant neoplasm of upper lobe, right bronchus or lung     Indication for treatment:  curative    Radiation treatment dates:   11/09/2015-11/20/2015  Site/dose:  1. Right abdomen // 30 Gy with 10 fractions at a dose of 3 Gy/fraction using an en face electron field 2. Right lung // 30 Gy with 10 fractions at a dose of 3 Gy/fraction using a 3 field 3-D conformal technique  Beams/energy:  1. En face // 12 MeV 2. 3D // 10 X, 15 X  Narrative: The patient tolerated radiation treatment relatively well.   Plan: The patient has completed radiation treatment. The patient will return to radiation oncology clinic for routine followup in one month. I advised them to call or return sooner if they have any questions or concerns related to their recovery or treatment.  ------------------------------------------------  Jodelle Gross, MD, PhD  This document serves as a record of services personally performed by Kyung Rudd, MD. It was created on his behalf by Bethann Humble, a trained medical scribe. The creation of this record is based on the scribe's personal observations and the provider's statements to them. This document has been checked and approved by the attending provider.

## 2015-11-26 DIAGNOSIS — Z79891 Long term (current) use of opiate analgesic: Secondary | ICD-10-CM | POA: Diagnosis not present

## 2015-11-26 DIAGNOSIS — F17210 Nicotine dependence, cigarettes, uncomplicated: Secondary | ICD-10-CM | POA: Diagnosis not present

## 2015-11-26 DIAGNOSIS — F319 Bipolar disorder, unspecified: Secondary | ICD-10-CM | POA: Diagnosis not present

## 2015-11-26 DIAGNOSIS — F1410 Cocaine abuse, uncomplicated: Secondary | ICD-10-CM | POA: Diagnosis not present

## 2015-11-26 DIAGNOSIS — F259 Schizoaffective disorder, unspecified: Secondary | ICD-10-CM | POA: Diagnosis not present

## 2015-11-26 DIAGNOSIS — C3490 Malignant neoplasm of unspecified part of unspecified bronchus or lung: Secondary | ICD-10-CM | POA: Diagnosis not present

## 2015-11-26 DIAGNOSIS — F1010 Alcohol abuse, uncomplicated: Secondary | ICD-10-CM | POA: Diagnosis not present

## 2015-12-04 ENCOUNTER — Other Ambulatory Visit: Payer: Self-pay | Admitting: Medical Oncology

## 2015-12-04 DIAGNOSIS — C3411 Malignant neoplasm of upper lobe, right bronchus or lung: Secondary | ICD-10-CM

## 2015-12-07 ENCOUNTER — Ambulatory Visit (HOSPITAL_BASED_OUTPATIENT_CLINIC_OR_DEPARTMENT_OTHER): Payer: Medicare Other | Admitting: Internal Medicine

## 2015-12-07 ENCOUNTER — Encounter: Payer: Self-pay | Admitting: Internal Medicine

## 2015-12-07 ENCOUNTER — Other Ambulatory Visit (HOSPITAL_BASED_OUTPATIENT_CLINIC_OR_DEPARTMENT_OTHER): Payer: Medicare Other

## 2015-12-07 ENCOUNTER — Other Ambulatory Visit: Payer: Self-pay | Admitting: *Deleted

## 2015-12-07 ENCOUNTER — Telehealth: Payer: Self-pay | Admitting: Internal Medicine

## 2015-12-07 VITALS — BP 123/88 | HR 79 | Temp 98.6°F | Resp 18 | Ht 74.0 in | Wt 172.5 lb

## 2015-12-07 DIAGNOSIS — C3411 Malignant neoplasm of upper lobe, right bronchus or lung: Secondary | ICD-10-CM

## 2015-12-07 DIAGNOSIS — J90 Pleural effusion, not elsewhere classified: Secondary | ICD-10-CM

## 2015-12-07 DIAGNOSIS — J189 Pneumonia, unspecified organism: Secondary | ICD-10-CM | POA: Diagnosis not present

## 2015-12-07 DIAGNOSIS — Z5111 Encounter for antineoplastic chemotherapy: Secondary | ICD-10-CM | POA: Insufficient documentation

## 2015-12-07 DIAGNOSIS — D649 Anemia, unspecified: Secondary | ICD-10-CM

## 2015-12-07 DIAGNOSIS — D630 Anemia in neoplastic disease: Secondary | ICD-10-CM

## 2015-12-07 DIAGNOSIS — C7989 Secondary malignant neoplasm of other specified sites: Secondary | ICD-10-CM

## 2015-12-07 LAB — CBC WITH DIFFERENTIAL/PLATELET
BASO%: 0.1 % (ref 0.0–2.0)
Basophils Absolute: 0 10*3/uL (ref 0.0–0.1)
EOS%: 0.7 % (ref 0.0–7.0)
Eosinophils Absolute: 0.1 10*3/uL (ref 0.0–0.5)
HCT: 24.2 % — ABNORMAL LOW (ref 38.4–49.9)
HGB: 7.5 g/dL — ABNORMAL LOW (ref 13.0–17.1)
LYMPH%: 6.7 % — ABNORMAL LOW (ref 14.0–49.0)
MCH: 27.2 pg (ref 27.2–33.4)
MCHC: 31 g/dL — ABNORMAL LOW (ref 32.0–36.0)
MCV: 87.7 fL (ref 79.3–98.0)
MONO#: 0.5 10*3/uL (ref 0.1–0.9)
MONO%: 4.3 % (ref 0.0–14.0)
NEUT#: 9.8 10*3/uL — ABNORMAL HIGH (ref 1.5–6.5)
NEUT%: 88.2 % — ABNORMAL HIGH (ref 39.0–75.0)
Platelets: 473 10*3/uL — ABNORMAL HIGH (ref 140–400)
RBC: 2.76 10*6/uL — ABNORMAL LOW (ref 4.20–5.82)
RDW: 14.9 % — ABNORMAL HIGH (ref 11.0–14.6)
WBC: 11.1 10*3/uL — ABNORMAL HIGH (ref 4.0–10.3)
lymph#: 0.7 10*3/uL — ABNORMAL LOW (ref 0.9–3.3)

## 2015-12-07 LAB — COMPREHENSIVE METABOLIC PANEL
ALT: 13 U/L (ref 0–55)
AST: 9 U/L (ref 5–34)
Albumin: 2.3 g/dL — ABNORMAL LOW (ref 3.5–5.0)
Alkaline Phosphatase: 212 U/L — ABNORMAL HIGH (ref 40–150)
Anion Gap: 10 mEq/L (ref 3–11)
BUN: 8.9 mg/dL (ref 7.0–26.0)
CO2: 23 mEq/L (ref 22–29)
Calcium: 8.8 mg/dL (ref 8.4–10.4)
Chloride: 108 mEq/L (ref 98–109)
Creatinine: 0.8 mg/dL (ref 0.7–1.3)
EGFR: >90 mL/min/{1.73_m2} (ref >90)
Glucose: 87 mg/dl (ref 70–140)
Potassium: 4.2 mEq/L (ref 3.5–5.1)
Sodium: 142 mEq/L (ref 136–145)
Total Bilirubin: <0.3 mg/dL (ref 0.20–1.20)
Total Protein: 7.3 g/dL (ref 6.4–8.3)

## 2015-12-07 MED ORDER — PROCHLORPERAZINE MALEATE 10 MG PO TABS: 10.0000 mg | ORAL_TABLET | Freq: Four times a day (QID) | ORAL | 0 refills | Status: DC | PRN

## 2015-12-07 MED ORDER — HYDROCODONE-ACETAMINOPHEN 5-325 MG PO TABS: 1.0000 | ORAL_TABLET | ORAL | 0 refills | Status: DC | PRN

## 2015-12-07 NOTE — Progress Notes (Signed)
START ON PATHWAY REGIMEN - Non-Small Cell Lung  XHB716: Carboplatin + Paclitaxel q21 Days x 4 Cycles   A cycle is every 21 days:     Paclitaxel (Taxol(R)) 200 mg/m2 in 500 mL NS IV over 3 hours followed by Dose Mod: None     Carboplatin (Paraplatin(R)) AUC=6 in 250 mL NS IV over 1 hour Dose Mod: None Additional Orders: * All AUC calculations intended to be used in [employer] formula  **Always confirm dose/schedule in your pharmacy ordering system**    Patient Characteristics: Stage IV Metastatic, Squamous, PS = 0, 1, First Line, PD-L1 Expression Positive 1-49% (TPS) / Negative / Not Tested / Not a Candidate for Immunotherapy AJCC M Stage: 1 AJCC N Stage: 2 AJCC T Stage: 3 Current Disease Status: Distant Metastases AJCC Stage Grouping: IV Histology: Squamous Cell Line of therapy: First Line PD-L1 Expression Status: PD-L1 Positive 1-49% (TPS) Performance Status: PS = 0, 1 Would you be surprised if this patient died  in the next year? I would NOT be surprised if this patient died in the next year  Intent of Therapy: Non-Curative / Palliative Intent, Discussed with Patient

## 2015-12-07 NOTE — Telephone Encounter (Signed)
Message sent to chemo scheduler to add chemo.  Avs report and appt schd given per 12/07/15 los.

## 2015-12-07 NOTE — Progress Notes (Addendum)
[facility] [phone]   [phone]  OFFICE PROGRESS NOTE  Grant Fontana, MD (Inactive) No address on file  DIAGNOSIS: Stage IV (T3, N2, M1b) non-small cell lung cancer, squamous cell carcinoma with PDL 1 expression 25%, presented with massive right upper lobe lung mass in addition to mediastinal lymphadenopathy and subcutaneous metastatic disease. Interestingly the patient is not symptomatic except from the subcutaneous nodule diagnosed in August 2017.  PRIOR THERAPY: Palliative radiotherapy to the right lung and abdominal wall mass under the care of Dr. Lisbeth Renshaw completed on 11/20/2015.  CURRENT THERAPY: Systemic chemotherapy with carboplatin for AUC of 5 and paclitaxel 175 MG/M2 every 3 weeks with Neulasta support. First dose 12/17/2015.  INTERVAL HISTORY: Keith Murray 55 y.o. male returns to the clinic today for follow-up visit accompanied by his 2 daughters. The patient was seen during his hospitalization at [facility] when he was diagnosed with a stage IV non-small cell lung cancer, squamous cell carcinoma. MRI of the brain at that time showed no evidence for metastatic disease to the brain. CT of the abdomen and pelvis showed abdominal mass in addition to mass adjacent to the left kidney. PDL 1 expression was 25%. He underwent palliative radiotherapy to the large right lung mass in addition to the subcutaneous abdominal wall mass in the right lower quadrant of the abdomen. This was completed on 11/20/2015 and the patient is here today for evaluation and discussion of his systemic treatment options. He is feeling a little bit better except for fatigue secondary to severe anemia. The patient also has lack of appetite. He denied having any significant chest pain but continues to have shortness breath with exertion was dry cough and no hemoptysis. He has no nausea, vomiting, diarrhea or constipation. He has no headache or visual  changes.  MEDICAL HISTORY: Past Medical History:  Diagnosis Date  . Anxiety   . Polysubstance abuse    cocaine, etoh, marijuana  . Psychosis   . Schizoaffective disorder ([place])     ALLERGIES:  has No Known Allergies.  MEDICATIONS:  Current Outpatient Prescriptions  Medication Sig Dispense Refill  . albuterol (PROVENTIL) (2.5 MG/3ML) 0.083% nebulizer solution Take 3 mLs (2.5 mg total) by nebulization every 6 (six) hours as needed for wheezing or shortness of breath. 75 mL 12  . benztropine (COGENTIN) 0.5 MG tablet Take 1 tablet (0.5 mg total) by mouth 2 (two) times daily. For prevention of drug induced tremors 60 tablet 0  . FLUoxetine (PROZAC) 20 MG capsule Take 1 capsule (20 mg total) by mouth daily. 30 capsule 3  . guaiFENesin (MUCINEX) 600 MG 12 hr tablet Take 1 tablet (600 mg total) by mouth 2 (two) times daily. 60 tablet 2  . haloperidol (HALDOL) 5 MG tablet Take 1 tablet (5 mg total) by mouth 2 (two) times daily. For mood control 60 tablet 0  . HYDROcodone-acetaminophen (NORCO/VICODIN) 5-325 MG tablet Take 1-2 tablets by mouth every 4 (four) hours as needed for moderate pain. 30 tablet 0  . hydrOXYzine (ATARAX/VISTARIL) 25 MG tablet Take 1 tablet (25 mg) every 6 hours as needed: Foe anxiety/sleep 60 tablet 0  . levofloxacin (LEVAQUIN) 750 MG tablet Take 1 tablet (750 mg total) by mouth daily. 1 tablet 0  . Multiple Vitamin (MULTIVITAMIN WITH MINERALS) TABS tablet Take 1 tablet by mouth daily. May purchase over the counter: As a Vitamin supplement    . naproxen (EC-NAPROSYN) 500 MG EC tablet Take 1 tablet (500 mg total)  by mouth 2 (two) times daily with a meal. 60 tablet 2  . nicotine (NICODERM CQ - DOSED IN MG/24 HOURS) 21 mg/24hr patch Place 1 patch (21 mg total) onto the skin daily. 28 patch 0  . phosphorus (K PHOS NEUTRAL) 155-852-130 MG tablet Take 1 tablet (250 mg total) by mouth 2 (two) times daily. 60 tablet 0  . traZODone (DESYREL) 100 MG tablet Take 1 tablet (100 mg total)  by mouth at bedtime. For insomnia 30 tablet 0  . Wound Dressings (SONAFINE) Apply 1 application topically 2 (two) times daily.     No current facility-administered medications for this visit.     SURGICAL HISTORY:  Past Surgical History:  Procedure Laterality Date  . HERNIA REPAIR      REVIEW OF SYSTEMS:  Constitutional: positive for fatigue and weight loss Eyes: negative Ears, nose, mouth, throat, and face: negative Respiratory: positive for cough and dyspnea on exertion Cardiovascular: negative Gastrointestinal: negative Genitourinary:negative Integument/breast: negative Hematologic/lymphatic: negative Musculoskeletal:negative Neurological: negative Behavioral/Psych: negative Endocrine: negative Allergic/Immunologic: negative   PHYSICAL EXAMINATION: General appearance: alert, cooperative, fatigued and no distress Head: Normocephalic, without obvious abnormality, atraumatic Neck: no adenopathy, no JVD, supple, symmetrical, trachea midline and thyroid not enlarged, symmetric, no tenderness/mass/nodules Lymph nodes: Cervical, supraclavicular, and axillary nodes normal. Resp: diminished breath sounds RLL and dullness to percussion RLL Back: symmetric, no curvature. ROM normal. No CVA tenderness. Cardio: regular rate and rhythm, S1, S2 normal, no murmur, click, rub or gallop GI: soft, non-tender; bowel sounds normal; no masses,  no organomegaly Extremities: extremities normal, atraumatic, no cyanosis or edema Neurologic: Alert and oriented X 3, normal strength and tone. Normal symmetric reflexes. Normal coordination and gait  ECOG PERFORMANCE STATUS: 1 - Symptomatic but completely ambulatory  Blood pressure 123/88, pulse 79, temperature 98.6 F (37 C), temperature source Oral, resp. rate 18, height '6\' 2"'$  (1.88 m), weight 172 lb 8 oz (78.2 kg), SpO2 99 %.  LABORATORY DATA: Lab Results  Component Value Date   WBC 11.1 (H) 12/07/2015   HGB 7.5 (L) 12/07/2015   HCT 24.2 (L)  12/07/2015   MCV 87.7 12/07/2015   PLT 473 (H) 12/07/2015      Chemistry      Component Value Date/Time   NA 137 11/06/2015 0519   K 4.8 11/06/2015 0519   CL 112 (H) 11/06/2015 0519   CO2 20 (L) 11/06/2015 0519   BUN 14 11/06/2015 0519   CREATININE 1.06 11/06/2015 0519      Component Value Date/Time   CALCIUM 10.2 11/06/2015 0519   ALKPHOS 172 (H) 11/03/2015 0354   AST 10 (L) 11/03/2015 0354   ALT 6 (L) 11/03/2015 0354   BILITOT 0.6 11/03/2015 0354       RADIOGRAPHIC STUDIES: No results found.  ASSESSMENT AND PLAN: This is a very pleasant 55 years old African-American male recently diagnosed with a stage IV non-small cell lung cancer, squamous cell carcinoma with PDL 1 expression 25%. Presented with large right upper lobe lung mass, mediastinal lymph nodes in addition to right pleural effusion and abdominal and subcutaneous masses diagnosed in August 2017. The patient underwent palliative radiotherapy to the right upper lobe lung mass in addition to the right lower quadrant abdominal subcutaneous mass. I had a lengthy discussion with the patient his family today about his current disease is stage, prognosis and treatment options. I showed them the images of the CT scan of the chest and abdomen. They understand that he has incurable condition and also treatment will  be off palliative nature. I give the patient the option of palliative care and hospice referral versus consideration of elective systemic chemotherapy with carboplatin and paclitaxel. The patient his family are interested in proceeding with treatment. I recommended for him a regimen of carboplatin for AUC of 5 and paclitaxel 175 MG/M2 every 3 weeks with Neulasta support. I discussed with the patient his family the adverse effect of this treatment including but not limited to alopecia, myelosuppression, nausea and vomiting, peripheral neuropathy, liver or renal dysfunction. He is expected to start the first dose of this  treatment on 12/17/2015. I will arrange for the patient to have a chemotherapy education class before the first dose of his chemotherapy. I will call his pharmacy with prescription for Compazine 10 mg by mouth every 6 hours as needed for nausea. For pain management, I gave the patient refill of Vicodin. He will contact his primary care physician regarding refill for his other medications. For the anemia of neoplastic disease, I will arrange for the patient to receive 2 units of PRBCs transfusion tomorrow. He would come back for follow-up visit in 2 weeks for evaluation and management of any adverse effect of his treatment. The patient was advised to call immediately if he has any concerning symptoms in the interval. The patient voices understanding of current disease status and treatment options and is in agreement with the current care plan.  All questions were answered. The patient knows to call the clinic with any problems, questions or concerns. We can certainly see the patient much sooner if necessary.  I spent 30 minutes counseling the patient face to face. The total time spent in the appointment was 40 minutes.  Disclaimer: This note was dictated with voice recognition software. Similar sounding words can inadvertently be transcribed and may not be corrected upon review.

## 2015-12-08 ENCOUNTER — Ambulatory Visit (HOSPITAL_COMMUNITY)
Admission: RE | Admit: 2015-12-08 | Discharge: 2015-12-08 | Disposition: A | Discharge status: Home / Self Care | Payer: Medicare Other | Source: Ambulatory Visit | Attending: Internal Medicine | Admitting: Internal Medicine

## 2015-12-08 ENCOUNTER — Ambulatory Visit (HOSPITAL_COMMUNITY)
Admission: RE | Admit: 2015-12-08 | Discharge: 2015-12-08 | Disposition: A | Discharge status: Home / Self Care | Payer: Medicare Other | Source: Ambulatory Visit

## 2015-12-08 ENCOUNTER — Telehealth: Payer: Self-pay | Admitting: *Deleted

## 2015-12-08 ENCOUNTER — Other Ambulatory Visit (HOSPITAL_BASED_OUTPATIENT_CLINIC_OR_DEPARTMENT_OTHER): Payer: Medicare Other

## 2015-12-08 DIAGNOSIS — D649 Anemia, unspecified: Secondary | ICD-10-CM | POA: Insufficient documentation

## 2015-12-08 DIAGNOSIS — C3411 Malignant neoplasm of upper lobe, right bronchus or lung: Secondary | ICD-10-CM

## 2015-12-08 LAB — PREPARE RBC (CROSSMATCH)

## 2015-12-08 LAB — ABO/RH: ABO/RH(D): O POS

## 2015-12-08 MED ORDER — SODIUM CHLORIDE 0.9 % IV SOLN
250.0000 mL | Freq: Once | INTRAVENOUS | Status: AC
Start: 2015-12-08 — End: 2015-12-08
  Administered 2015-12-08: 250 mL via INTRAVENOUS

## 2015-12-08 MED ORDER — DIPHENHYDRAMINE HCL 25 MG PO CAPS
25.0000 mg | ORAL_CAPSULE | Freq: Once | ORAL | Status: AC
Start: 2015-12-08 — End: 2015-12-08
  Administered 2015-12-08: 25 mg via ORAL
  Filled 2015-12-08: qty 1

## 2015-12-08 MED ORDER — HEPARIN SOD (PORK) LOCK FLUSH 100 UNIT/ML IV SOLN: 500.0000 [IU] | Freq: Every day | INTRAVENOUS | Status: DC | PRN

## 2015-12-08 MED ORDER — ACETAMINOPHEN 325 MG PO TABS
650.0000 mg | ORAL_TABLET | Freq: Once | ORAL | Status: AC
  Administered 2015-12-08: 650 mg via ORAL
  Filled 2015-12-08: qty 2

## 2015-12-08 MED ORDER — SODIUM CHLORIDE 0.9% FLUSH: 3.0000 mL | INTRAVENOUS | Status: DC | PRN

## 2015-12-08 NOTE — Telephone Encounter (Signed)
Per LOS I have scheduled appts, moved 9/21 to 9/22 due to first time high risk drugs. Notified the scheduler

## 2015-12-08 NOTE — Discharge Instructions (Signed)
Blood Transfusion, Care After  2 unit of PRBC's transfused today   These instructions give you information about caring for yourself after your procedure. Your doctor may also give you more specific instructions. Call your doctor if you have any problems or questions after your procedure.  HOME CARE  Take medicines only as told by your doctor. Ask your doctor if you can take an over-the-counter pain reliever if you have a fever or headache a day or two after your procedure.  Return to your normal activities as told by your doctor. GET HELP IF:   You develop redness or irritation at your IV site.  You have a fever, chills, or a headache that does not go away.  Your pee (urine) is darker than normal.  Your urine turns:  Pink.  Red.  [place].  The white part of your eye turns yellow (jaundice).  You feel weak after doing your normal activities. GET HELP RIGHT AWAY IF:   You have trouble breathing.  You have fever and chills and you also have:  Anxiety.  Chest or back pain.  Flushed or pink skin.  Clammy or sweaty skin.  A fast heartbeat.  A sick feeling in your stomach (nausea).   This information is not intended to replace advice given to you by your health care provider. Make sure you discuss any questions you have with your health care provider.   Document Released: 04/04/2014 Document Reviewed: 04/04/2014 Elsevier Interactive Patient Education [employer].

## 2015-12-08 NOTE — Progress Notes (Signed)
Patient ID: Keith Murray, male   DOB: [birth year], 55 y.o.   MRN: [id number] Provider: Curt Bears MD  Associated Diagnosis:Symptomatic anemia (D64.9  Procedure: Transfusion of 2 units PRBC  Patient tolerated transfusion well. No reaction. Went over discharge instructions and niece here for discharge. Discharged to home. Alert, oriented and ambulatory.

## 2015-12-09 LAB — TYPE AND SCREEN
ABO/RH(D): O POS
Antibody Screen: NEGATIVE
Unit division: 0
Unit division: 0

## 2015-12-11 DIAGNOSIS — C3490 Malignant neoplasm of unspecified part of unspecified bronchus or lung: Secondary | ICD-10-CM | POA: Diagnosis not present

## 2015-12-11 DIAGNOSIS — Z79891 Long term (current) use of opiate analgesic: Secondary | ICD-10-CM | POA: Diagnosis not present

## 2015-12-16 DIAGNOSIS — Z79891 Long term (current) use of opiate analgesic: Secondary | ICD-10-CM | POA: Diagnosis not present

## 2015-12-16 DIAGNOSIS — C3490 Malignant neoplasm of unspecified part of unspecified bronchus or lung: Secondary | ICD-10-CM | POA: Diagnosis not present

## 2015-12-17 ENCOUNTER — Encounter: Payer: Self-pay | Admitting: Internal Medicine

## 2015-12-17 ENCOUNTER — Other Ambulatory Visit (HOSPITAL_BASED_OUTPATIENT_CLINIC_OR_DEPARTMENT_OTHER): Payer: Medicare Other

## 2015-12-17 ENCOUNTER — Encounter: Payer: Self-pay | Admitting: *Deleted

## 2015-12-17 ENCOUNTER — Other Ambulatory Visit: Payer: Medicare Other

## 2015-12-17 DIAGNOSIS — C3411 Malignant neoplasm of upper lobe, right bronchus or lung: Secondary | ICD-10-CM | POA: Diagnosis not present

## 2015-12-17 LAB — COMPREHENSIVE METABOLIC PANEL
ALT: 9 U/L (ref 0–55)
AST: 9 U/L (ref 5–34)
Albumin: 2.5 g/dL — ABNORMAL LOW (ref 3.5–5.0)
Alkaline Phosphatase: 244 U/L — ABNORMAL HIGH (ref 40–150)
Anion Gap: 8 mEq/L (ref 3–11)
BUN: 11.3 mg/dL (ref 7.0–26.0)
CO2: 24 mEq/L (ref 22–29)
Calcium: 9.2 mg/dL (ref 8.4–10.4)
Chloride: 108 mEq/L (ref 98–109)
Creatinine: 0.8 mg/dL (ref 0.7–1.3)
EGFR: >90 mL/min/{1.73_m2} (ref >90)
Glucose: 102 mg/dl (ref 70–140)
Potassium: 4.6 mEq/L (ref 3.5–5.1)
Sodium: 141 mEq/L (ref 136–145)
Total Bilirubin: 0.38 mg/dL (ref 0.20–1.20)
Total Protein: 7.5 g/dL (ref 6.4–8.3)

## 2015-12-17 LAB — CBC WITH DIFFERENTIAL/PLATELET
BASO%: 0.1 % (ref 0.0–2.0)
Basophils Absolute: 0 10*3/uL (ref 0.0–0.1)
EOS%: 1.8 % (ref 0.0–7.0)
Eosinophils Absolute: 0.2 10*3/uL (ref 0.0–0.5)
HCT: 29.6 % — ABNORMAL LOW (ref 38.4–49.9)
HGB: 9.3 g/dL — ABNORMAL LOW (ref 13.0–17.1)
LYMPH%: 8 % — ABNORMAL LOW (ref 14.0–49.0)
MCH: 27.7 pg (ref 27.2–33.4)
MCHC: 31.4 g/dL — ABNORMAL LOW (ref 32.0–36.0)
MCV: 88.1 fL (ref 79.3–98.0)
MONO#: 0.6 10*3/uL (ref 0.1–0.9)
MONO%: 6.1 % (ref 0.0–14.0)
NEUT#: 8.1 10*3/uL — ABNORMAL HIGH (ref 1.5–6.5)
NEUT%: 84 % — ABNORMAL HIGH (ref 39.0–75.0)
Platelets: 447 10*3/uL — ABNORMAL HIGH (ref 140–400)
RBC: 3.36 10*6/uL — ABNORMAL LOW (ref 4.20–5.82)
RDW: 15.2 % — ABNORMAL HIGH (ref 11.0–14.6)
WBC: 9.7 10*3/uL (ref 4.0–10.3)
lymph#: 0.8 10*3/uL — ABNORMAL LOW (ref 0.9–3.3)

## 2015-12-17 NOTE — Progress Notes (Signed)
Reviewed patient's plan from chemo ed schedule. Called insurance to find out if OOP has been met to determine if he may need co-pay assistance. It has not. Met with patient and nieces after class to discuss. They were interested in any help possible. Based off of reported income, patient approved for one-time $400 [place]. It has been set up in [employer]. Patient was given a copy of the grant as well as the expense sheet along with our Outpatient Pharmacy information. Fatima Sanger was explained to nieces and how to use for medications and gas cards. Patient has moved in with them so no personal bills are in his name. My card was given to the two of them as well as the patient. They were also wondering about Medicaid for medical bills. Medicaid application was given and to return to [place] along with the bills they are requesting assistance for. Completed application through [place] for [employer]. Had patient to sign application. Will obtain signature from the physician and fax to [place]. Asked patient if he had any additional questions or concerns and he said no. Asked his nieces and they said no and thanked me for my help.

## 2015-12-17 NOTE — Progress Notes (Signed)
Faxed application to [employer]. Fax received ok per confirmation sheet. The turn around time for a response per [place] at [facility] is 2-5 business days. If approved, next step will be move forward with product replacement request which then will require physician signature.

## 2015-12-18 ENCOUNTER — Ambulatory Visit (HOSPITAL_BASED_OUTPATIENT_CLINIC_OR_DEPARTMENT_OTHER): Payer: Medicare Other

## 2015-12-18 VITALS — BP 142/95 | HR 73 | Temp 98.0°F | Resp 20

## 2015-12-18 DIAGNOSIS — C3411 Malignant neoplasm of upper lobe, right bronchus or lung: Secondary | ICD-10-CM

## 2015-12-18 DIAGNOSIS — Z5111 Encounter for antineoplastic chemotherapy: Secondary | ICD-10-CM

## 2015-12-18 MED ORDER — SODIUM CHLORIDE 0.9 % IV SOLN
175.0000 mg/m2 | Freq: Once | INTRAVENOUS | Status: AC
  Administered 2015-12-18: 354 mg via INTRAVENOUS
  Filled 2015-12-18: qty 59

## 2015-12-18 MED ORDER — SODIUM CHLORIDE 0.9 % IV SOLN
Freq: Once | INTRAVENOUS | Status: AC
  Administered 2015-12-18: 09:00:00 via INTRAVENOUS

## 2015-12-18 MED ORDER — PEGFILGRASTIM 6 MG/0.6ML ~~LOC~~ PSKT
6.0000 mg | PREFILLED_SYRINGE | Freq: Once | SUBCUTANEOUS | Status: AC
  Administered 2015-12-18: 6 mg via SUBCUTANEOUS
  Filled 2015-12-18: qty 0.6

## 2015-12-18 MED ORDER — SODIUM CHLORIDE 0.9 % IV SOLN
710.0000 mg | Freq: Once | INTRAVENOUS | Status: AC
  Administered 2015-12-18: 710 mg via INTRAVENOUS
  Filled 2015-12-18: qty 71

## 2015-12-18 MED ORDER — DEXAMETHASONE SODIUM PHOSPHATE 100 MG/10ML IJ SOLN
20.0000 mg | Freq: Once | INTRAMUSCULAR | Status: AC
  Administered 2015-12-18: 20 mg via INTRAVENOUS
  Filled 2015-12-18: qty 2

## 2015-12-18 MED ORDER — PALONOSETRON HCL INJECTION 0.25 MG/5ML
INTRAVENOUS | Status: AC
  Filled 2015-12-18: qty 5

## 2015-12-18 MED ORDER — PALONOSETRON HCL INJECTION 0.25 MG/5ML
0.2500 mg | Freq: Once | INTRAVENOUS | Status: AC
  Administered 2015-12-18: 0.25 mg via INTRAVENOUS

## 2015-12-18 MED ORDER — FAMOTIDINE IN NACL 20-0.9 MG/50ML-% IV SOLN
INTRAVENOUS | Status: AC
  Filled 2015-12-18: qty 50

## 2015-12-18 MED ORDER — DIPHENHYDRAMINE HCL 50 MG/ML IJ SOLN
50.0000 mg | Freq: Once | INTRAMUSCULAR | Status: AC
  Administered 2015-12-18: 50 mg via INTRAVENOUS

## 2015-12-18 MED ORDER — DIPHENHYDRAMINE HCL 50 MG/ML IJ SOLN
INTRAMUSCULAR | Status: AC
  Filled 2015-12-18: qty 1

## 2015-12-18 MED ORDER — FAMOTIDINE IN NACL 20-0.9 MG/50ML-% IV SOLN
20.0000 mg | Freq: Once | INTRAVENOUS | Status: AC
  Administered 2015-12-18: 20 mg via INTRAVENOUS

## 2015-12-18 NOTE — Patient Instructions (Signed)
[facility] Discharge Instructions for Patients Receiving Chemotherapy  Today you received the following chemotherapy agents Taxol and Carboplatin.  To help prevent nausea and vomiting after your treatment, we encourage you to take your nausea medication as instructed by your MD.   If you develop nausea and vomiting that is not controlled by your nausea medication, call the clinic.   BELOW ARE SYMPTOMS THAT SHOULD BE REPORTED IMMEDIATELY:  *FEVER GREATER THAN 100.5 F  *CHILLS WITH OR WITHOUT FEVER  NAUSEA AND VOMITING THAT IS NOT CONTROLLED WITH YOUR NAUSEA MEDICATION  *UNUSUAL SHORTNESS OF BREATH  *UNUSUAL BRUISING OR BLEEDING  TENDERNESS IN MOUTH AND THROAT WITH OR WITHOUT PRESENCE OF ULCERS  *URINARY PROBLEMS  *BOWEL PROBLEMS  UNUSUAL RASH Items with * indicate a potential emergency and should be followed up as soon as possible.  Feel free to call the clinic you have any questions or concerns. The clinic phone number is [phone].  Please show the [facility] at check-in to the Emergency Department and triage nurse.  Paclitaxel injection What is this medicine? PACLITAXEL (PAK li TAX el) is a chemotherapy drug. It targets fast dividing cells, like cancer cells, and causes these cells to die. This medicine is used to treat ovarian cancer, breast cancer, and other cancers. This medicine may be used for other purposes; ask your health care provider or pharmacist if you have questions. What should I tell my health care provider before I take this medicine? They need to know if you have any of these conditions: -blood disorders -irregular heartbeat -infection (especially a virus infection such as chickenpox, cold sores, or herpes) -liver disease -previous or ongoing radiation therapy -an unusual or allergic reaction to paclitaxel, alcohol, polyoxyethylated castor oil, other chemotherapy agents, other medicines, foods, dyes, or preservatives -pregnant  or trying to get pregnant -breast-feeding How should I use this medicine? This drug is given as an infusion into a vein. It is administered in a hospital or clinic by a specially trained health care professional. Talk to your pediatrician regarding the use of this medicine in children. Special care may be needed. Overdosage: If you think you have taken too much of this medicine contact a poison control center or emergency room at once. NOTE: This medicine is only for you. Do not share this medicine with others. What if I miss a dose? It is important not to miss your dose. Call your doctor or health care professional if you are unable to keep an appointment. What may interact with this medicine? Do not take this medicine with any of the following medications: -disulfiram -metronidazole This medicine may also interact with the following medications: -cyclosporine -diazepam -ketoconazole -medicines to increase blood counts like filgrastim, pegfilgrastim, sargramostim -other chemotherapy drugs like cisplatin, doxorubicin, epirubicin, etoposide, teniposide, vincristine -quinidine -testosterone -vaccines -verapamil Talk to your doctor or health care professional before taking any of these medicines: -acetaminophen -aspirin -ibuprofen -ketoprofen -naproxen This list may not describe all possible interactions. Give your health care provider a list of all the medicines, herbs, non-prescription drugs, or dietary supplements you use. Also tell them if you smoke, drink alcohol, or use illegal drugs. Some items may interact with your medicine. What should I watch for while using this medicine? Your condition will be monitored carefully while you are receiving this medicine. You will need important blood work done while you are taking this medicine. This drug may make you feel generally unwell. This is not uncommon, as chemotherapy can affect healthy  cells as well as cancer cells. Report any side  effects. Continue your course of treatment even though you feel ill unless your doctor tells you to stop. This medicine can cause serious allergic reactions. To reduce your risk you will need to take other medicine(s) before treatment with this medicine. In some cases, you may be given additional medicines to help with side effects. Follow all directions for their use. Call your doctor or health care professional for advice if you get a fever, chills or sore throat, or other symptoms of a cold or flu. Do not treat yourself. This drug decreases your body's ability to fight infections. Try to avoid being around people who are sick. This medicine may increase your risk to bruise or bleed. Call your doctor or health care professional if you notice any unusual bleeding. Be careful brushing and flossing your teeth or using a toothpick because you may get an infection or bleed more easily. If you have any dental work done, tell your dentist you are receiving this medicine. Avoid taking products that contain aspirin, acetaminophen, ibuprofen, naproxen, or ketoprofen unless instructed by your doctor. These medicines may hide a fever. Do not become pregnant while taking this medicine. Women should inform their doctor if they wish to become pregnant or think they might be pregnant. There is a potential for serious side effects to an unborn child. Talk to your health care professional or pharmacist for more information. Do not breast-feed an infant while taking this medicine. Men are advised not to father a child while receiving this medicine. This product may contain alcohol. Ask your pharmacist or healthcare provider if this medicine contains alcohol. Be sure to tell all healthcare providers you are taking this medicine. Certain medicines, like metronidazole and disulfiram, can cause an unpleasant reaction when taken with alcohol. The reaction includes flushing, headache, nausea, vomiting, sweating, and increased  thirst. The reaction can last from 30 minutes to several hours. What side effects may I notice from receiving this medicine? Side effects that you should report to your doctor or health care professional as soon as possible: -allergic reactions like skin rash, itching or hives, swelling of the face, lips, or tongue -low blood counts - This drug may decrease the number of white blood cells, red blood cells and platelets. You may be at increased risk for infections and bleeding. -signs of infection - fever or chills, cough, sore throat, pain or difficulty passing urine -signs of decreased platelets or bleeding - bruising, pinpoint red spots on the skin, black, tarry stools, nosebleeds -signs of decreased red blood cells - unusually weak or tired, fainting spells, lightheadedness -breathing problems -chest pain -high or low blood pressure -mouth sores -nausea and vomiting -pain, swelling, redness or irritation at the injection site -pain, tingling, numbness in the hands or feet -slow or irregular heartbeat -swelling of the ankle, feet, hands Side effects that usually do not require medical attention (report to your doctor or health care professional if they continue or are bothersome): -bone pain -complete hair loss including hair on your head, underarms, pubic hair, eyebrows, and eyelashes -changes in the color of fingernails -diarrhea -loosening of the fingernails -loss of appetite -muscle or joint pain -red flush to skin -sweating This list may not describe all possible side effects. Call your doctor for medical advice about side effects. You may report side effects to FDA at 1-800-FDA-1088. Where should I keep my medicine? This drug is given in a hospital or clinic and will not  be stored at home. NOTE: This sheet is a summary. It may not cover all possible information. If you have questions about this medicine, talk to your doctor, pharmacist, or health care provider.    2016,  Elsevier/Gold Standard. (2014-10-30 13:02:56)  Carboplatin injection What is this medicine? CARBOPLATIN (KAR boe pla tin) is a chemotherapy drug. It targets fast dividing cells, like cancer cells, and causes these cells to die. This medicine is used to treat ovarian cancer and many other cancers. This medicine may be used for other purposes; ask your health care provider or pharmacist if you have questions. What should I tell my health care provider before I take this medicine? They need to know if you have any of these conditions: -blood disorders -hearing problems -kidney disease -recent or ongoing radiation therapy -an unusual or allergic reaction to carboplatin, cisplatin, other chemotherapy, other medicines, foods, dyes, or preservatives -pregnant or trying to get pregnant -breast-feeding How should I use this medicine? This drug is usually given as an infusion into a vein. It is administered in a hospital or clinic by a specially trained health care professional. Talk to your pediatrician regarding the use of this medicine in children. Special care may be needed. Overdosage: If you think you have taken too much of this medicine contact a poison control center or emergency room at once. NOTE: This medicine is only for you. Do not share this medicine with others. What if I miss a dose? It is important not to miss a dose. Call your doctor or health care professional if you are unable to keep an appointment. What may interact with this medicine? -medicines for seizures -medicines to increase blood counts like filgrastim, pegfilgrastim, sargramostim -some antibiotics like amikacin, gentamicin, neomycin, streptomycin, tobramycin -vaccines Talk to your doctor or health care professional before taking any of these medicines: -acetaminophen -aspirin -ibuprofen -ketoprofen -naproxen This list may not describe all possible interactions. Give your health care provider a list of all the  medicines, herbs, non-prescription drugs, or dietary supplements you use. Also tell them if you smoke, drink alcohol, or use illegal drugs. Some items may interact with your medicine. What should I watch for while using this medicine? Your condition will be monitored carefully while you are receiving this medicine. You will need important blood work done while you are taking this medicine. This drug may make you feel generally unwell. This is not uncommon, as chemotherapy can affect healthy cells as well as cancer cells. Report any side effects. Continue your course of treatment even though you feel ill unless your doctor tells you to stop. In some cases, you may be given additional medicines to help with side effects. Follow all directions for their use. Call your doctor or health care professional for advice if you get a fever, chills or sore throat, or other symptoms of a cold or flu. Do not treat yourself. This drug decreases your body's ability to fight infections. Try to avoid being around people who are sick. This medicine may increase your risk to bruise or bleed. Call your doctor or health care professional if you notice any unusual bleeding. Be careful brushing and flossing your teeth or using a toothpick because you may get an infection or bleed more easily. If you have any dental work done, tell your dentist you are receiving this medicine. Avoid taking products that contain aspirin, acetaminophen, ibuprofen, naproxen, or ketoprofen unless instructed by your doctor. These medicines may hide a fever. Do not become pregnant while  taking this medicine. Women should inform their doctor if they wish to become pregnant or think they might be pregnant. There is a potential for serious side effects to an unborn child. Talk to your health care professional or pharmacist for more information. Do not breast-feed an infant while taking this medicine. What side effects may I notice from receiving this  medicine? Side effects that you should report to your doctor or health care professional as soon as possible: -allergic reactions like skin rash, itching or hives, swelling of the face, lips, or tongue -signs of infection - fever or chills, cough, sore throat, pain or difficulty passing urine -signs of decreased platelets or bleeding - bruising, pinpoint red spots on the skin, black, tarry stools, nosebleeds -signs of decreased red blood cells - unusually weak or tired, fainting spells, lightheadedness -breathing problems -changes in hearing -changes in vision -chest pain -high blood pressure -low blood counts - This drug may decrease the number of white blood cells, red blood cells and platelets. You may be at increased risk for infections and bleeding. -nausea and vomiting -pain, swelling, redness or irritation at the injection site -pain, tingling, numbness in the hands or feet -problems with balance, talking, walking -trouble passing urine or change in the amount of urine Side effects that usually do not require medical attention (report to your doctor or health care professional if they continue or are bothersome): -hair loss -loss of appetite -metallic taste in the mouth or changes in taste This list may not describe all possible side effects. Call your doctor for medical advice about side effects. You may report side effects to FDA at 1-800-FDA-1088. Where should I keep my medicine? This drug is given in a hospital or clinic and will not be stored at home. NOTE: This sheet is a summary. It may not cover all possible information. If you have questions about this medicine, talk to your doctor, pharmacist, or health care provider.    2016, Elsevier/Gold Standard. (2007-06-19 14:38:05)  Pegfilgrastim injection What is this medicine? PEGFILGRASTIM (PEG fil gra stim) is a long-acting granulocyte colony-stimulating factor that stimulates the growth of neutrophils, a type of white blood  cell important in the body's fight against infection. It is used to reduce the incidence of fever and infection in patients with certain types of cancer who are receiving chemotherapy that affects the bone marrow, and to increase survival after being exposed to high doses of radiation. This medicine may be used for other purposes; ask your health care provider or pharmacist if you have questions. What should I tell my health care provider before I take this medicine? They need to know if you have any of these conditions: -kidney disease -latex allergy -ongoing radiation therapy -sickle cell disease -skin reactions to acrylic adhesives (On-Body Injector only) -an unusual or allergic reaction to pegfilgrastim, filgrastim, other medicines, foods, dyes, or preservatives -pregnant or trying to get pregnant -breast-feeding How should I use this medicine? This medicine is for injection under the skin. If you get this medicine at home, you will be taught how to prepare and give the pre-filled syringe or how to use the On-body Injector. Refer to the patient Instructions for Use for detailed instructions. Use exactly as directed. Take your medicine at regular intervals. Do not take your medicine more often than directed. It is important that you put your used needles and syringes in a special sharps container. Do not put them in a trash can. If you do not have a  sharps container, call your pharmacist or healthcare provider to get one. Talk to your pediatrician regarding the use of this medicine in children. While this drug may be prescribed for selected conditions, precautions do apply. Overdosage: If you think you have taken too much of this medicine contact a poison control center or emergency room at once. NOTE: This medicine is only for you. Do not share this medicine with others. What if I miss a dose? It is important not to miss your dose. Call your doctor or health care professional if you miss your  dose. If you miss a dose due to an On-body Injector failure or leakage, a new dose should be administered as soon as possible using a single prefilled syringe for manual use. What may interact with this medicine? Interactions have not been studied. Give your health care provider a list of all the medicines, herbs, non-prescription drugs, or dietary supplements you use. Also tell them if you smoke, drink alcohol, or use illegal drugs. Some items may interact with your medicine. This list may not describe all possible interactions. Give your health care provider a list of all the medicines, herbs, non-prescription drugs, or dietary supplements you use. Also tell them if you smoke, drink alcohol, or use illegal drugs. Some items may interact with your medicine. What should I watch for while using this medicine? You may need blood work done while you are taking this medicine. If you are going to need a MRI, CT scan, or other procedure, tell your doctor that you are using this medicine (On-Body Injector only). What side effects may I notice from receiving this medicine? Side effects that you should report to your doctor or health care professional as soon as possible: -allergic reactions like skin rash, itching or hives, swelling of the face, lips, or tongue -dizziness -fever -pain, redness, or irritation at site where injected -pinpoint red spots on the skin -red or dark-brown urine -shortness of breath or breathing problems -stomach or side pain, or pain at the shoulder -swelling -tiredness -trouble passing urine or change in the amount of urine Side effects that usually do not require medical attention (report to your doctor or health care professional if they continue or are bothersome): -bone pain -muscle pain This list may not describe all possible side effects. Call your doctor for medical advice about side effects. You may report side effects to FDA at 1-800-FDA-1088. Where should I keep  my medicine? Keep out of the reach of children. Store pre-filled syringes in a refrigerator between 2 and 8 degrees C (36 and 46 degrees F). Do not freeze. Keep in carton to protect from light. Throw away this medicine if it is left out of the refrigerator for more than 48 hours. Throw away any unused medicine after the expiration date. NOTE: This sheet is a summary. It may not cover all possible information. If you have questions about this medicine, talk to your doctor, pharmacist, or health care provider.    2016, Elsevier/Gold Standard. (2014-04-03 14:30:14)

## 2015-12-18 NOTE — Progress Notes (Signed)
Pt receiving first-time taxol/carbo today. Lab work within parameters from yesterday. Reinforced expected S/E of the taxol and carboplatin and times to notify symptom management or visit the ED. Educated the pt regarding the importance of hydration and nutrition. Also educated the pt about potential bone pain from the onpro injector. Pt verbalized understanding. In basket placed for first-time f/u phone call on Monday. Taxol titrated per first-time pt protocol. Upon d/c pt asymptomatic and VSS.

## 2015-12-21 ENCOUNTER — Telehealth: Payer: Self-pay | Admitting: *Deleted

## 2015-12-21 NOTE — Progress Notes (Deleted)
Mr   Is here for a follow up visit for malignant neoplasm of upper lobe right lung.  Weight changes, if any:  Respiratory complaints, if any:  Hemoptysis, if any:   Swallowing Problems/Pain/Difficulty swallowing: Smoking Tobacco/Marijuana/Snuff/ETOH use: Skin: Pain :  Appetite: Fatigue: When is next chemo scheduled?: Lab work from of chart:

## 2015-12-21 NOTE — Telephone Encounter (Signed)
Follow up call placed to pt. Unable to reach, lmovm to call office with any concerns.

## 2015-12-21 NOTE — Progress Notes (Signed)
Mr   Is here for a follow up visit for malignant neoplasm of upper lobe right lung.  Weight changes, if any:  Respiratory complaints, if any:  Hemoptysis, if any:   Swallowing Problems/Pain/Difficulty swallowing: Smoking Tobacco/Marijuana/Snuff/ETOH use:Former smoker quit  Skin: Pain :  Appetite: Fatigue: When is next chemo scheduled?: Lab work from of chart:

## 2015-12-21 NOTE — Telephone Encounter (Signed)
-----   Message from [name], RN sent at 12/18/2015 11:10 AM EDT ----- Regarding: Keith Murray - first-time f/u taxol/carbo [name] first-time f/u taxol/carbo

## 2015-12-22 ENCOUNTER — Ambulatory Visit
Admission: RE | Admit: 2015-12-22 | Discharge: 2015-12-22 | Disposition: A | Discharge status: Home / Self Care | Payer: Medicare Other | Source: Ambulatory Visit | Attending: Radiation Oncology | Admitting: Radiation Oncology

## 2015-12-22 ENCOUNTER — Encounter: Payer: Self-pay | Admitting: Radiation Oncology

## 2015-12-22 VITALS — BP 108/80 | HR 102 | Temp 98.2°F | Ht 74.0 in | Wt 167.1 lb

## 2015-12-22 DIAGNOSIS — C3411 Malignant neoplasm of upper lobe, right bronchus or lung: Secondary | ICD-10-CM | POA: Diagnosis not present

## 2015-12-22 DIAGNOSIS — J984 Other disorders of lung: Secondary | ICD-10-CM | POA: Diagnosis not present

## 2015-12-22 DIAGNOSIS — R7303 Prediabetes: Secondary | ICD-10-CM | POA: Diagnosis not present

## 2015-12-22 DIAGNOSIS — E538 Deficiency of other specified B group vitamins: Secondary | ICD-10-CM | POA: Diagnosis not present

## 2015-12-22 DIAGNOSIS — E7211 Homocystinuria: Secondary | ICD-10-CM | POA: Diagnosis not present

## 2015-12-22 DIAGNOSIS — R109 Unspecified abdominal pain: Secondary | ICD-10-CM | POA: Insufficient documentation

## 2015-12-22 DIAGNOSIS — R079 Chest pain, unspecified: Secondary | ICD-10-CM | POA: Diagnosis not present

## 2015-12-22 DIAGNOSIS — M792 Neuralgia and neuritis, unspecified: Secondary | ICD-10-CM | POA: Diagnosis not present

## 2015-12-22 DIAGNOSIS — E785 Hyperlipidemia, unspecified: Secondary | ICD-10-CM | POA: Diagnosis not present

## 2015-12-22 NOTE — Progress Notes (Signed)
Mr. Yellen is here for follow up of radiation completed 11/20/15 to his Right abdomen and Right Lung. He reports pain a 8/10 to his mid chest which runs down to his mid abdomen. He is taking hydrocodone for pain relief. He is unable to tell me exactly how often. He is asking for refills prescribed by a NP at the [facility], and I have written down her name for him. He denies a cough or shortness of breath. He tells me he is eating well and feeling better. He thanks everybody for their wonderful care and is very appreciative of his care received here.   BP 108/80   Pulse (!) 102   Temp 98.2 F (36.8 C)   Ht '6\' 2"'$  (1.88 m)   Wt 167 lb 1.6 oz (75.8 kg)   SpO2 100% Comment: room air  BMI 21.45 kg/m    Wt Readings from Last 3 Encounters:  12/22/15 167 lb 1.6 oz (75.8 kg)  12/07/15 172 lb 8 oz (78.2 kg)  11/20/15 167 lb 8 oz (76 kg)

## 2015-12-22 NOTE — Progress Notes (Signed)
Radiation Oncology         [phone] ________________________________  Name: [facility] MRN: [id number]  Date: 12/22/2015  DOB: Jan 25, 1961  Post Treatment Note  CC: Grant Fontana, MD (Inactive)  No ref. provider found  Diagnosis:   Stage IV, T3, N2, M1b NSCLC, squamous cell carcinoma of the right upper lobe of the lung with subcutaneous disease.  Interval Since Last Radiation:  4 weeks   11/09/2015-11/20/2015: 1. Right abdomen // 30 Gy with 10 fractions at a dose of 3 Gy/fraction using an en face electron field 2. Right lung // 30 Gy with 10 fractions at a dose of 3 Gy/fraction using a 3 field 3-D conformal technique  Narrative:  The patient returns today for routine follow-up.  He did well during radiotherapy without desquamation or skin break down.                               On review of systems, the patient states he is doing well overall. He continues to have some chest pain that runs down his right abdomen and this is controlled with Hydrocodone. He denies shortness of breath, fevers, chills, nausea, or hemoptysis. He denies any difficulty swallowing. No other complaints are verbalized.  ALLERGIES:  has No Known Allergies.  Meds: Current Outpatient Prescriptions  Medication Sig Dispense Refill  . albuterol (PROVENTIL) (2.5 MG/3ML) 0.083% nebulizer solution Take 3 mLs (2.5 mg total) by nebulization every 6 (six) hours as needed for wheezing or shortness of breath. 75 mL 12  . benztropine (COGENTIN) 0.5 MG tablet Take 1 tablet (0.5 mg total) by mouth 2 (two) times daily. For prevention of drug induced tremors 60 tablet 0  . FLUoxetine (PROZAC) 20 MG capsule Take 1 capsule (20 mg total) by mouth daily. 30 capsule 3  . guaiFENesin (MUCINEX) 600 MG 12 hr tablet Take 1 tablet (600 mg total) by mouth 2 (two) times daily. 60 tablet 2  . haloperidol (HALDOL) 5 MG tablet Take 1 tablet (5 mg total) by mouth 2 (two) times daily. For mood control 60 tablet 0  .  HYDROcodone-acetaminophen (NORCO/VICODIN) 5-325 MG tablet Take 1-2 tablets by mouth every 4 (four) hours as needed for moderate pain. 30 tablet 0  . hydrOXYzine (ATARAX/VISTARIL) 25 MG tablet Take 1 tablet (25 mg) every 6 hours as needed: Foe anxiety/sleep 60 tablet 0  . Multiple Vitamin (MULTIVITAMIN WITH MINERALS) TABS tablet Take 1 tablet by mouth daily. May purchase over the counter: As a Vitamin supplement    . naproxen (EC-NAPROSYN) 500 MG EC tablet Take 1 tablet (500 mg total) by mouth 2 (two) times daily with a meal. 60 tablet 2  . nicotine (NICODERM CQ - DOSED IN MG/24 HOURS) 21 mg/24hr patch Place 1 patch (21 mg total) onto the skin daily. 28 patch 0  . phosphorus (K PHOS NEUTRAL) 155-852-130 MG tablet Take 1 tablet (250 mg total) by mouth 2 (two) times daily. 60 tablet 0  . prochlorperazine (COMPAZINE) 10 MG tablet Take 1 tablet (10 mg total) by mouth every 6 (six) hours as needed for nausea or vomiting. 30 tablet 0  . traZODone (DESYREL) 100 MG tablet Take 1 tablet (100 mg total) by mouth at bedtime. For insomnia 30 tablet 0  . Wound Dressings (SONAFINE) Apply 1 application topically 2 (two) times daily.     No current facility-administered medications for this encounter.     Physical Findings:  height is '6\' 2"'$  (  1.88 m) and weight is 167 lb 1.6 oz (75.8 kg). His temperature is 98.2 F (36.8 C). His blood pressure is 108/80 and his pulse is 102 (abnormal). His oxygen saturation is 100%.  In general this is a well appearing African American male in no acute distress. He's alert and oriented x4 and appropriate throughout the examination. Cardiopulmonary assessment is negative for acute distress and he exhibits normal effort. The skin on his abdomen is healing well without desquamation but central hypopigmentation is noted of the right abdominal wall lesion. He does not have hyperpigmentation over the chest wall anteriorly or posteriorly.  Lab Findings: Lab Results  Component Value Date     WBC 9.7 12/17/2015   HGB 9.3 (L) 12/17/2015   HCT 29.6 (L) 12/17/2015   MCV 88.1 12/17/2015   PLT 447 (H) 12/17/2015     Radiographic Findings: No results found.  Impression/Plan: 1. Stage IV, T3, N2, M1b NSCLC, squamous cell carcinoma of the right upper lobe of the lung with subcutaneous disease. The patient has done well since undergoing radiotherapy to the right lung and abdominal wall mass. He will follow up with Dr. Julien Nordmann and continue systemic chemotherapy. We will see him back on an as needed basis. He states agreement and understanding.     Carola Rhine, PAC

## 2015-12-22 NOTE — Addendum Note (Signed)
Encounter addended by: Malena Edman, RN on: 12/22/2015 11:00 AM<BR>    Actions taken: Charge Capture section accepted

## 2015-12-24 ENCOUNTER — Ambulatory Visit (HOSPITAL_BASED_OUTPATIENT_CLINIC_OR_DEPARTMENT_OTHER): Payer: Medicare Other | Admitting: Internal Medicine

## 2015-12-24 ENCOUNTER — Ambulatory Visit (HOSPITAL_BASED_OUTPATIENT_CLINIC_OR_DEPARTMENT_OTHER): Payer: Medicare Other

## 2015-12-24 ENCOUNTER — Telehealth: Payer: Self-pay | Admitting: *Deleted

## 2015-12-24 ENCOUNTER — Encounter: Payer: Self-pay | Admitting: Internal Medicine

## 2015-12-24 ENCOUNTER — Telehealth: Payer: Self-pay | Admitting: Internal Medicine

## 2015-12-24 ENCOUNTER — Telehealth: Payer: Self-pay | Admitting: Medical Oncology

## 2015-12-24 VITALS — BP 120/71 | HR 98 | Temp 98.4°F | Resp 17 | Ht 74.0 in | Wt 162.3 lb

## 2015-12-24 DIAGNOSIS — Z79891 Long term (current) use of opiate analgesic: Secondary | ICD-10-CM | POA: Diagnosis not present

## 2015-12-24 DIAGNOSIS — C3411 Malignant neoplasm of upper lobe, right bronchus or lung: Secondary | ICD-10-CM

## 2015-12-24 DIAGNOSIS — C3490 Malignant neoplasm of unspecified part of unspecified bronchus or lung: Secondary | ICD-10-CM | POA: Diagnosis not present

## 2015-12-24 DIAGNOSIS — Z5111 Encounter for antineoplastic chemotherapy: Secondary | ICD-10-CM

## 2015-12-24 LAB — CBC WITH DIFFERENTIAL/PLATELET
BASO%: 0.1 % (ref 0.0–2.0)
Basophils Absolute: 0 10*3/uL (ref 0.0–0.1)
EOS%: 0.9 % (ref 0.0–7.0)
Eosinophils Absolute: 0.1 10*3/uL (ref 0.0–0.5)
HCT: 29.3 % — ABNORMAL LOW (ref 38.4–49.9)
HGB: 9.4 g/dL — ABNORMAL LOW (ref 13.0–17.1)
LYMPH%: 4.8 % — ABNORMAL LOW (ref 14.0–49.0)
MCH: 28.1 pg (ref 27.2–33.4)
MCHC: 32.1 g/dL (ref 32.0–36.0)
MCV: 87.5 fL (ref 79.3–98.0)
MONO#: 0.8 10*3/uL (ref 0.1–0.9)
MONO%: 6.2 % (ref 0.0–14.0)
NEUT#: 11.5 10*3/uL — ABNORMAL HIGH (ref 1.5–6.5)
NEUT%: 88 % — ABNORMAL HIGH (ref 39.0–75.0)
Platelets: 323 10*3/uL (ref 140–400)
RBC: 3.35 10*6/uL — ABNORMAL LOW (ref 4.20–5.82)
RDW: 15.8 % — ABNORMAL HIGH (ref 11.0–14.6)
WBC: 13.1 10*3/uL — ABNORMAL HIGH (ref 4.0–10.3)
lymph#: 0.6 10*3/uL — ABNORMAL LOW (ref 0.9–3.3)

## 2015-12-24 LAB — COMPREHENSIVE METABOLIC PANEL
ALT: 12 U/L (ref 0–55)
AST: 13 U/L (ref 5–34)
Albumin: 2.9 g/dL — ABNORMAL LOW (ref 3.5–5.0)
Alkaline Phosphatase: 272 U/L — ABNORMAL HIGH (ref 40–150)
Anion Gap: 10 mEq/L (ref 3–11)
BUN: 14.3 mg/dL (ref 7.0–26.0)
CO2: 22 mEq/L (ref 22–29)
Calcium: 9.3 mg/dL (ref 8.4–10.4)
Chloride: 107 mEq/L (ref 98–109)
Creatinine: 0.9 mg/dL (ref 0.7–1.3)
EGFR: >90 mL/min/{1.73_m2} (ref >90)
Glucose: 91 mg/dl (ref 70–140)
Potassium: 4.3 mEq/L (ref 3.5–5.1)
Sodium: 139 mEq/L (ref 136–145)
Total Bilirubin: <0.3 mg/dL (ref 0.20–1.20)
Total Protein: 8 g/dL (ref 6.4–8.3)

## 2015-12-24 NOTE — Patient Instructions (Signed)
Smoking Cessation, Tips for Success If you are ready to quit smoking, congratulations! You have chosen to help yourself be healthier. Cigarettes bring nicotine, tar, carbon monoxide, and other irritants into your body. Your lungs, heart, and blood vessels will be able to work better without these poisons. There are many different ways to quit smoking. Nicotine gum, nicotine patches, a nicotine inhaler, or nicotine nasal spray can help with physical craving. Hypnosis, support groups, and medicines help break the habit of smoking. WHAT THINGS CAN I DO TO MAKE QUITTING EASIER?  Here are some tips to help you quit for good:  Pick a date when you will quit smoking completely. Tell all of your friends and family about your plan to quit on that date.  Do not try to slowly cut down on the number of cigarettes you are smoking. Pick a quit date and quit smoking completely starting on that day.  Throw away all cigarettes.   Clean and remove all ashtrays from your home, work, and car.  On a card, write down your reasons for quitting. Carry the card with you and read it when you get the urge to smoke.  Cleanse your body of nicotine. Drink enough water and fluids to keep your urine clear or pale yellow. Do this after quitting to flush the nicotine from your body.  Learn to predict your moods. Do not let a bad situation be your excuse to have a cigarette. Some situations in your life might tempt you into wanting a cigarette.  Never have "just one" cigarette. It leads to wanting another and another. Remind yourself of your decision to quit.  Change habits associated with smoking. If you smoked while driving or when feeling stressed, try other activities to replace smoking. Stand up when drinking your coffee. Brush your teeth after eating. Sit in a different chair when you read the paper. Avoid alcohol while trying to quit, and try to drink fewer caffeinated beverages. Alcohol and caffeine may urge you to  smoke.  Avoid foods and drinks that can trigger a desire to smoke, such as sugary or spicy foods and alcohol.  Ask people who smoke not to smoke around you.  Have something planned to do right after eating or having a cup of coffee. For example, plan to take a walk or exercise.  Try a relaxation exercise to calm you down and decrease your stress. Remember, you may be tense and nervous for the first 2 weeks after you quit, but this will pass.  Find new activities to keep your hands busy. Play with a pen, coin, or rubber band. Doodle or draw things on paper.  Brush your teeth right after eating. This will help cut down on the craving for the taste of tobacco after meals. You can also try mouthwash.   Use oral substitutes in place of cigarettes. Try using lemon drops, carrots, cinnamon sticks, or chewing gum. Keep them handy so they are available when you have the urge to smoke.  When you have the urge to smoke, try deep breathing.  Designate your home as a nonsmoking area.  If you are a heavy smoker, ask your health care provider about a prescription for nicotine chewing gum. It can ease your withdrawal from nicotine.  Reward yourself. Set aside the cigarette money you save and buy yourself something nice.  Look for support from others. Join a support group or smoking cessation program. Ask someone at home or at work to help you with your plan   to quit smoking.  Always ask yourself, "Do I need this cigarette or is this just a reflex?" Tell yourself, "Today, I choose not to smoke," or "I do not want to smoke." You are reminding yourself of your decision to quit.  Do not replace cigarette smoking with electronic cigarettes (commonly called e-cigarettes). The safety of e-cigarettes is unknown, and some may contain harmful chemicals.  If you relapse, do not give up! Plan ahead and think about what you will do the next time you get the urge to smoke. HOW WILL I FEEL WHEN I QUIT SMOKING? You  may have symptoms of withdrawal because your body is used to nicotine (the addictive substance in cigarettes). You may crave cigarettes, be irritable, feel very hungry, cough often, get headaches, or have difficulty concentrating. The withdrawal symptoms are only temporary. They are strongest when you first quit but will go away within 10-14 days. When withdrawal symptoms occur, stay in control. Think about your reasons for quitting. Remind yourself that these are signs that your body is healing and getting used to being without cigarettes. Remember that withdrawal symptoms are easier to treat than the major diseases that smoking can cause.  Even after the withdrawal is over, expect periodic urges to smoke. However, these cravings are generally short lived and will go away whether you smoke or not. Do not smoke! WHAT RESOURCES ARE AVAILABLE TO HELP ME QUIT SMOKING? Your health care provider can direct you to community resources or hospitals for support, which may include:  Group support.  Education.  Hypnosis.  Therapy.   This information is not intended to replace advice given to you by your health care provider. Make sure you discuss any questions you have with your health care provider.   Document Released: 12/11/2003 Document Revised: 04/04/2014 Document Reviewed: 08/30/2012 Elsevier Interactive Patient Education 2016 [employer]  

## 2015-12-24 NOTE — Telephone Encounter (Signed)
Left message to call about refills for mental health meds.

## 2015-12-24 NOTE — Telephone Encounter (Signed)
I left message for [facility] staff to return call because pt needs refill on cogentin and haldol ( 5 days left) and they are having difficulty getting an appt. To get refills. I asked them to please help get refills soon as pt has to come here weekly for labs and tx.

## 2015-12-24 NOTE — Telephone Encounter (Signed)
Per LOS I have scheduled appts and notified the scheduler 

## 2015-12-24 NOTE — Progress Notes (Signed)
[facility] Telephone:([phone]) [phone]   Fax:([phone]) [phone]  OFFICE PROGRESS NOTE  Grant Fontana, MD (Inactive) No address on file  DIAGNOSIS: Stage IV (T3, N2, M1b) non-small cell lung cancer, squamous cell carcinoma with PDL 1 expression 25%, presented with massive right upper lobe lung mass in addition to mediastinal lymphadenopathy and subcutaneous metastatic disease. Interestingly the patient is not symptomatic except from the subcutaneous nodule diagnosed in August 2017.  PRIOR THERAPY: Palliative radiotherapy to the right lung and abdominal wall mass under the care of Dr. Lisbeth Renshaw completed on 11/20/2015.  CURRENT THERAPY: Systemic chemotherapy with carboplatin for AUC of 5 and paclitaxel 175 MG/M2 every 3 weeks with Neulasta support. First dose 12/17/2015. Status post one cycle.  INTERVAL HISTORY: [facility] 55 y.o. male returns to the clinic today for follow-up visit accompanied by his daughter. The patient is doing very well today. He tolerated the first cycle of his chemotherapy with carboplatin and paclitaxel fairly well. He denied having any significant fever or chills. He has no nausea or vomiting. He denied having any significant chest pain, shortness breath, cough or hemoptysis. Has no significant weight loss or night sweats.  MEDICAL HISTORY: Past Medical History:  Diagnosis Date  . Anxiety   . Polysubstance abuse    cocaine, etoh, marijuana  . Psychosis   . Schizoaffective disorder ([place])     ALLERGIES:  has No Known Allergies.  MEDICATIONS:  Current Outpatient Prescriptions  Medication Sig Dispense Refill  . albuterol (PROVENTIL) (2.5 MG/3ML) 0.083% nebulizer solution Take 3 mLs (2.5 mg total) by nebulization every 6 (six) hours as needed for wheezing or shortness of breath. 75 mL 12  . benztropine (COGENTIN) 0.5 MG tablet Take 1 tablet (0.5 mg total) by mouth 2 (two) times daily. For prevention of drug induced tremors 60 tablet 0  . FLUoxetine  (PROZAC) 20 MG capsule Take 1 capsule (20 mg total) by mouth daily. 30 capsule 3  . guaiFENesin (MUCINEX) 600 MG 12 hr tablet Take 1 tablet (600 mg total) by mouth 2 (two) times daily. 60 tablet 2  . haloperidol (HALDOL) 5 MG tablet Take 1 tablet (5 mg total) by mouth 2 (two) times daily. For mood control 60 tablet 0  . HYDROcodone-acetaminophen (NORCO/VICODIN) 5-325 MG tablet Take 1-2 tablets by mouth every 4 (four) hours as needed for moderate pain. 30 tablet 0  . hydrOXYzine (ATARAX/VISTARIL) 25 MG tablet Take 1 tablet (25 mg) every 6 hours as needed: Foe anxiety/sleep 60 tablet 0  . Multiple Vitamin (MULTIVITAMIN WITH MINERALS) TABS tablet Take 1 tablet by mouth daily. May purchase over the counter: As a Vitamin supplement    . naproxen (EC-NAPROSYN) 500 MG EC tablet Take 1 tablet (500 mg total) by mouth 2 (two) times daily with a meal. 60 tablet 2  . nicotine (NICODERM CQ - DOSED IN MG/24 HOURS) 21 mg/24hr patch Place 1 patch (21 mg total) onto the skin daily. 28 patch 0  . phosphorus (K PHOS NEUTRAL) 155-852-130 MG tablet Take 1 tablet (250 mg total) by mouth 2 (two) times daily. 60 tablet 0  . prochlorperazine (COMPAZINE) 10 MG tablet Take 1 tablet (10 mg total) by mouth every 6 (six) hours as needed for nausea or vomiting. 30 tablet 0  . traZODone (DESYREL) 100 MG tablet Take 1 tablet (100 mg total) by mouth at bedtime. For insomnia 30 tablet 0  . Wound Dressings (SONAFINE) Apply 1 application topically 2 (two) times daily.  No current facility-administered medications for this visit.     SURGICAL HISTORY:  Past Surgical History:  Procedure Laterality Date  . HERNIA REPAIR      REVIEW OF SYSTEMS:  A comprehensive review of systems was negative.   PHYSICAL EXAMINATION: General appearance: alert, cooperative, fatigued and no distress Head: Normocephalic, without obvious abnormality, atraumatic Neck: no adenopathy, no JVD, supple, symmetrical, trachea midline and thyroid not  enlarged, symmetric, no tenderness/mass/nodules Lymph nodes: Cervical, supraclavicular, and axillary nodes normal. Resp: diminished breath sounds RLL and dullness to percussion RLL Back: symmetric, no curvature. ROM normal. No CVA tenderness. Cardio: regular rate and rhythm, S1, S2 normal, no murmur, click, rub or gallop GI: soft, non-tender; bowel sounds normal; no masses,  no organomegaly Extremities: extremities normal, atraumatic, no cyanosis or edema Neurologic: Alert and oriented X 3, normal strength and tone. Normal symmetric reflexes. Normal coordination and gait  ECOG PERFORMANCE STATUS: 1 - Symptomatic but completely ambulatory  Blood pressure 120/71, pulse 98, temperature 98.4 F (36.9 C), temperature source Oral, resp. rate 17, height '6\' 2"'$  (1.88 m), weight 162 lb 4.8 oz (73.6 kg), SpO2 100 %.  LABORATORY DATA: Lab Results  Component Value Date   WBC 9.7 12/17/2015   HGB 9.3 (L) 12/17/2015   HCT 29.6 (L) 12/17/2015   MCV 88.1 12/17/2015   PLT 447 (H) 12/17/2015      Chemistry      Component Value Date/Time   NA 141 12/17/2015 0937   K 4.6 12/17/2015 0937   CL 112 (H) 11/06/2015 0519   CO2 24 12/17/2015 0937   BUN 11.3 12/17/2015 0937   CREATININE 0.8 12/17/2015 0937      Component Value Date/Time   CALCIUM 9.2 12/17/2015 0937   ALKPHOS 244 (H) 12/17/2015 0937   AST 9 12/17/2015 0937   ALT 9 12/17/2015 0937   BILITOT 0.38 12/17/2015 0937       RADIOGRAPHIC STUDIES: No results found.  ASSESSMENT AND PLAN: This is a very pleasant 55 years old African-American male recently diagnosed with a stage IV non-small cell lung cancer, squamous cell carcinoma with PDL 1 expression 25%. Presented with large right upper lobe lung mass, mediastinal lymph nodes in addition to right pleural effusion and abdominal and subcutaneous masses diagnosed in August 2017. The patient underwent palliative radiotherapy to the right upper lobe lung mass in addition to the right lower  quadrant abdominal subcutaneous mass. He is currently undergoing systemic chemotherapy with carboplatin and paclitaxel is status post 1 cycle. He tolerated the first cycle of his treatment fairly well. I recommended for the patient to continue his systemic chemotherapy and he is expected to start cycle #2 in 2 weeks. He would come back for follow-up visit at that time. The patient was advised to call immediately if he has any concerning symptoms in the interval. The patient voices understanding of current disease status and treatment options and is in agreement with the current care plan.  All questions were answered. The patient knows to call the clinic with any problems, questions or concerns. We can certainly see the patient much sooner if necessary.  Disclaimer: This note was dictated with voice recognition software. Similar sounding words can inadvertently be transcribed and may not be corrected upon review.

## 2015-12-24 NOTE — Telephone Encounter (Signed)
Message sent to chemo scheduler to add chemo. Also to [facility] chemo from 10/12 to 10/11 as per patient can only come on Wednesdays. Follow up and labs scheduled, also lab added on for today, per 12/24/15 los.  Avs report and schedule given to patient per 12/24/15 los.

## 2015-12-25 ENCOUNTER — Telehealth: Payer: Self-pay | Admitting: Medical Oncology

## 2015-12-25 NOTE — Telephone Encounter (Signed)
Pt being seen on Monday at [facility].

## 2015-12-27 ENCOUNTER — Encounter (HOSPITAL_COMMUNITY): Payer: Self-pay

## 2015-12-27 ENCOUNTER — Emergency Department (HOSPITAL_COMMUNITY)
Admission: EM | Admit: 2015-12-27 | Discharge: 2015-12-28 | Disposition: A | Discharge status: Home / Self Care | Payer: Medicare Other | Attending: Emergency Medicine | Admitting: Emergency Medicine

## 2015-12-27 DIAGNOSIS — F250 Schizoaffective disorder, bipolar type: Secondary | ICD-10-CM | POA: Insufficient documentation

## 2015-12-27 DIAGNOSIS — F309 Manic episode, unspecified: Secondary | ICD-10-CM | POA: Diagnosis not present

## 2015-12-27 DIAGNOSIS — R451 Restlessness and agitation: Secondary | ICD-10-CM

## 2015-12-27 DIAGNOSIS — F1910 Other psychoactive substance abuse, uncomplicated: Secondary | ICD-10-CM | POA: Diagnosis not present

## 2015-12-27 DIAGNOSIS — Z85118 Personal history of other malignant neoplasm of bronchus and lung: Secondary | ICD-10-CM | POA: Diagnosis not present

## 2015-12-27 DIAGNOSIS — Z818 Family history of other mental and behavioral disorders: Secondary | ICD-10-CM | POA: Diagnosis not present

## 2015-12-27 DIAGNOSIS — Z79899 Other long term (current) drug therapy: Secondary | ICD-10-CM | POA: Diagnosis not present

## 2015-12-27 DIAGNOSIS — F1410 Cocaine abuse, uncomplicated: Secondary | ICD-10-CM | POA: Insufficient documentation

## 2015-12-27 DIAGNOSIS — Z809 Family history of malignant neoplasm, unspecified: Secondary | ICD-10-CM | POA: Diagnosis not present

## 2015-12-27 DIAGNOSIS — F3010 Manic episode without psychotic symptoms, unspecified: Secondary | ICD-10-CM

## 2015-12-27 DIAGNOSIS — F919 Conduct disorder, unspecified: Secondary | ICD-10-CM | POA: Diagnosis present

## 2015-12-27 DIAGNOSIS — F17200 Nicotine dependence, unspecified, uncomplicated: Secondary | ICD-10-CM | POA: Insufficient documentation

## 2015-12-27 HISTORY — DX: Malignant (primary) neoplasm, unspecified: C80.1

## 2015-12-27 LAB — CBC WITH DIFFERENTIAL/PLATELET
Basophils Absolute: 0 10*3/uL (ref 0.0–0.1)
Basophils Relative: 0 %
Eosinophils Absolute: 0.2 10*3/uL (ref 0.0–0.7)
Eosinophils Relative: 2 %
HCT: 31.2 % — ABNORMAL LOW (ref 39.0–52.0)
Hemoglobin: 9.8 g/dL — ABNORMAL LOW (ref 13.0–17.0)
Lymphocytes Relative: 5 %
Lymphs Abs: 0.6 10*3/uL — ABNORMAL LOW (ref 0.7–4.0)
MCH: 27.9 pg (ref 26.0–34.0)
MCHC: 31.4 g/dL (ref 30.0–36.0)
MCV: 88.9 fL (ref 78.0–100.0)
Monocytes Absolute: 1.2 10*3/uL — ABNORMAL HIGH (ref 0.1–1.0)
Monocytes Relative: 10 %
Neutro Abs: 10.2 10*3/uL — ABNORMAL HIGH (ref 1.7–7.7)
Neutrophils Relative %: 83 %
Platelets: 372 10*3/uL (ref 150–400)
RBC: 3.51 MIL/uL — ABNORMAL LOW (ref 4.22–5.81)
RDW: 16.2 % — ABNORMAL HIGH (ref 11.5–15.5)
WBC: 12.2 10*3/uL — ABNORMAL HIGH (ref 4.0–10.5)

## 2015-12-27 LAB — URINALYSIS, ROUTINE W REFLEX MICROSCOPIC
Bilirubin Urine: NEGATIVE
Glucose, UA: NEGATIVE mg/dL
Hgb urine dipstick: NEGATIVE
Ketones, ur: NEGATIVE mg/dL
Leukocytes, UA: NEGATIVE
Nitrite: NEGATIVE
Protein, ur: NEGATIVE mg/dL
Specific Gravity, Urine: 1.003 — ABNORMAL LOW (ref 1.005–1.030)
pH: 6 (ref 5.0–8.0)

## 2015-12-27 LAB — COMPREHENSIVE METABOLIC PANEL
ALT: 14 U/L — ABNORMAL LOW (ref 17–63)
AST: 14 U/L — ABNORMAL LOW (ref 15–41)
Albumin: 3.9 g/dL (ref 3.5–5.0)
Alkaline Phosphatase: 215 U/L — ABNORMAL HIGH (ref 38–126)
Anion gap: 6 (ref 5–15)
BUN: 12 mg/dL (ref 6–20)
CO2: 26 mmol/L (ref 22–32)
Calcium: 9.2 mg/dL (ref 8.9–10.3)
Chloride: 110 mmol/L (ref 101–111)
Creatinine, Ser: 0.87 mg/dL (ref 0.61–1.24)
GFR calc Af Amer: >60 mL/min (ref >60)
GFR calc non Af Amer: >60 mL/min (ref >60)
Glucose, Bld: 78 mg/dL (ref 65–99)
Potassium: 3.6 mmol/L (ref 3.5–5.1)
Sodium: 142 mmol/L (ref 135–145)
Total Bilirubin: 0.7 mg/dL (ref 0.3–1.2)
Total Protein: 8.3 g/dL — ABNORMAL HIGH (ref 6.5–8.1)

## 2015-12-27 LAB — RAPID URINE DRUG SCREEN, HOSP PERFORMED
Amphetamines: NOT DETECTED
Barbiturates: NOT DETECTED
Benzodiazepines: NOT DETECTED
Cocaine: POSITIVE — AB
Opiates: NOT DETECTED
Tetrahydrocannabinol: POSITIVE — AB

## 2015-12-27 LAB — ETHANOL: Alcohol, Ethyl (B): <5 mg/dL (ref <5)

## 2015-12-27 MED ORDER — FLUOXETINE HCL 20 MG PO CAPS
20.0000 mg | ORAL_CAPSULE | Freq: Every day | ORAL | Status: DC
  Administered 2015-12-27 – 2015-12-28 (×2): 20 mg via ORAL
  Filled 2015-12-27 (×2): qty 1

## 2015-12-27 MED ORDER — BENZTROPINE MESYLATE 0.5 MG PO TABS
0.5000 mg | ORAL_TABLET | Freq: Two times a day (BID) | ORAL | Status: DC
Start: 2015-12-27 — End: 2015-12-28
  Administered 2015-12-27 – 2015-12-28 (×3): 0.5 mg via ORAL
  Filled 2015-12-27 (×3): qty 1

## 2015-12-27 MED ORDER — ASENAPINE MALEATE 5 MG SL SUBL
5.0000 mg | SUBLINGUAL_TABLET | Freq: Two times a day (BID) | SUBLINGUAL | Status: DC
  Administered 2015-12-27 – 2015-12-28 (×2): 5 mg via SUBLINGUAL
  Filled 2015-12-27 (×2): qty 1

## 2015-12-27 MED ORDER — STERILE WATER FOR INJECTION IJ SOLN
INTRAMUSCULAR | Status: AC
  Administered 2015-12-27: 11:00:00
  Filled 2015-12-27: qty 10

## 2015-12-27 MED ORDER — HALOPERIDOL 5 MG PO TABS: 5.0000 mg | ORAL_TABLET | Freq: Two times a day (BID) | ORAL | Status: DC

## 2015-12-27 MED ORDER — ZIPRASIDONE MESYLATE 20 MG IM SOLR
20.0000 mg | Freq: Once | INTRAMUSCULAR | Status: AC
  Administered 2015-12-27: 20 mg via INTRAMUSCULAR
  Filled 2015-12-27: qty 20

## 2015-12-27 MED ORDER — ASENAPINE MALEATE 5 MG SL SUBL
5.0000 mg | SUBLINGUAL_TABLET | Freq: Once | SUBLINGUAL | Status: AC
  Administered 2015-12-27: 5 mg via SUBLINGUAL
  Filled 2015-12-27: qty 1

## 2015-12-27 MED ORDER — ACETAMINOPHEN 500 MG PO TABS
1000.0000 mg | ORAL_TABLET | Freq: Once | ORAL | Status: AC
  Administered 2015-12-27: 1000 mg via ORAL
  Filled 2015-12-27: qty 2

## 2015-12-27 MED ORDER — TRAZODONE HCL 100 MG PO TABS
100.0000 mg | ORAL_TABLET | Freq: Every day | ORAL | Status: DC
  Administered 2015-12-27: 100 mg via ORAL
  Filled 2015-12-27: qty 1

## 2015-12-27 NOTE — ED Notes (Signed)
At this time, pt is very cooperative and does not require restraints. Pt allowed [place] to help him get changed into scrubs and socks and place his belongings in belongings bags. Pt is answering all of our questions appropriately.

## 2015-12-27 NOTE — ED Notes (Signed)
SBAR Report received from previous nurse. Pt received calm and visible on unit. Pt denies current SI/ HI, A/V H, depression, anxiety, and pain at this time, and is otherwise stable. Pt reminded of camera surveillance, q 15 min rounds, and rules of the milieu. Will continue to assess.

## 2015-12-27 NOTE — ED Notes (Signed)
Notified provider for patient's behavior. Patient has taken 3-4 shower and keeps yelling down the hall, making gestures with hands, jogging,  and threatening people who are not there. Orders to be received.

## 2015-12-27 NOTE — ED Notes (Signed)
Notified provider that patient has headache and would like some pain medication.

## 2015-12-27 NOTE — ED Notes (Signed)
Report given to [facility]  

## 2015-12-27 NOTE — ED Notes (Signed)
Bed: [facility]3 Expected date:  Expected time:  Means of arrival:  Comments: 

## 2015-12-27 NOTE — BH Assessment (Addendum)
Assessment Note  Keith Murray is an 55 y.o. male that presents this date to [facility] under IVC (initiated in ED). Patient refused assessment and was very agitated as this Probation officer attempted to complete. Information for the purposes of this assessment were gathered from admission notes. Per admission note: "Pt brought in by niece. Pt reports that he does not know why he here. Niece reports "I'm done. He is manic. He has been taking all of his medication except for his haldol. I have him $20 last night. He left, left my door wide open, and the fire department had to bring him back this morning."  Pt has stage IV lung CA and started chemo last week. Pt is followed by [facility] for psych needs. Denies SI/HI/AVH. Patient's speech is bizarre during assessment, as his mood rapidly changes from being highly agitated to being pleasant. Patient will answer a question coherently and then make incoherent statements becoming agitated. Per notes, patient's guardian is his niece. Patient  states he is unsure if he has any history of suicide attempts but does report prior psychiatric inpatient hospitalizations; per record review, patient has been hospitalized at [facility] 6 times since 2012. His most recent admission was in March 2015 due to Schizoaffective D/O and Alcohol Abuse. Patient denies current SA use but tested positive for cocaine and THC on admission. Per admission note: "Patient has a past medical history of schizoaffective disorder. Is also currently undergoing chemotherapy for lung cancer. Is apparently been off of his psychiatric medications and has been behaving aggressively and threatening family members. He lives with his 2 nieces, one of which accompanied him today. She reports that he has been physically threatening towards her and has been behaving erratically. The patient himself adds little additional history as he is extremely uncooperative, belligerent, and physically threatening toward anyone attempting to provide  care. Upon arrival to triage room, Pt became verbally aggressive w/ Pt across the hall.  Pt looked at the writer and aggressively asked "what's your problem." Patient answered "No" to every questions and threatened this writer to get out of his room. Case was staffed with Reita Cliche DNP who recommended inpatient admission per IVC as appropriate bed placement is investigated.     Diagnosis: Schizoaffective D/O, Polysubstance abuse (per notes)  Past Medical History:  Past Medical History:  Diagnosis Date  . Anxiety   . Cancer ([place])    lung  . Polysubstance abuse    cocaine, etoh, marijuana  . Psychosis   . Schizoaffective disorder [facility])     Past Surgical History:  Procedure Laterality Date  . HERNIA REPAIR      Family History:  Family History  Problem Relation Age of Onset  . Cancer Neg Hx   . Schizophrenia Neg Hx     Social History:  reports that he has been smoking.  He has a 2.00 pack-year smoking history. He has never used smokeless tobacco. He reports that he uses drugs, including Cocaine and Marijuana. He reports that he does not drink alcohol.  Additional Social History:  Alcohol / Drug Use Pain Medications: pt denies Prescriptions: pt denies Over the Counter: pt denies History of alcohol / drug use?: No history of alcohol / drug abuse Longest period of sobriety (when/how long): unknown, pt would not respond  CIWA: CIWA-Ar BP: (!) 130/108 Pulse Rate: 79 COWS:    Allergies: No Known Allergies  Home Medications:  (Not in a hospital admission)  OB/GYN Status:  No LMP for male patient.  General Assessment  Data Location of Assessment: [facility] ED TTS Assessment: In system Is this a Tele or Face-to-Face Assessment?: Face-to-Face Is this an Initial Assessment or a Re-assessment for this encounter?: Initial Assessment Marital status: Single Maiden name: na Is patient pregnant?: No Pregnancy Status: No Living Arrangements: Alone Can pt return to current living arrangement?:  Yes Admission Status: Involuntary Is patient capable of signing voluntary admission?: Yes Referral Source: Self/Family/Friend Insurance type: [employer] (per notes)  Medical Screening Exam ([place]) Medical Exam completed: Yes  Crisis Care Plan Living Arrangements: Alone Legal Guardian:  (na) Name of Psychiatrist: None noted Name of Therapist: None noted  Education Status Is patient currently in school?: No Current Grade: na Highest grade of school patient has completed:  (HS) Name of school: na Contact person: na  Risk to self with the past 6 months Suicidal Ideation: No (pt denies) Has patient been a risk to self within the past 6 months prior to admission? : No (per patient) Suicidal Intent: No (per patient) Has patient had any suicidal intent within the past 6 months prior to admission? : No Is patient at risk for suicide?: No Suicidal Plan?: No Has patient had any suicidal plan within the past 6 months prior to admission? : No Access to Means: No What has been your use of drugs/alcohol within the last 12 months?: pt denies Previous Attempts/Gestures: No How many times?: 0 Other Self Harm Risks: none Triggers for Past Attempts: Unknown Intentional Self Injurious Behavior: None Family Suicide History: No Recent stressful life event(s): Other (Comment) (family issues) Persecutory voices/beliefs?: No Depression: No (per patient) Depression Symptoms:  (pt states "none") Substance abuse history and/or treatment for substance abuse?: Yes Suicide prevention information given to non-admitted patients: Not applicable  Risk to Others within the past 6 months Homicidal Ideation: No Does patient have any lifetime risk of violence toward others beyond the six months prior to admission? : No Thoughts of Harm to Others: Yes-Currently Present Comment - Thoughts of Harm to Others: pt threatened staff on admission Current Homicidal Intent: Yes-Currently  Present Current Homicidal Plan: Yes-Currently Present Describe Current Homicidal Plan: pt stated he would "kill" staff Access to Homicidal Means: No Identified Victim: na History of harm to others?: No (per patient) Assessment of Violence: On admission Violent Behavior Description: pt refused to dress out  Does patient have access to weapons?: No Criminal Charges Pending?:  (unknown) Does patient have a court date:  (UTA) Is patient on probation?:  (UTA)  Psychosis Hallucinations: None noted Delusions: None noted  Mental Status Report Appearance/Hygiene: Unremarkable Eye Contact: Poor Motor Activity: Agitation Speech: Aggressive, Loud Level of Consciousness: Combative Mood: Angry Affect: Angry, Anxious Anxiety Level: Moderate Thought Processes: Tangential Judgement: Partial Orientation: Unable to assess Obsessive Compulsive Thoughts/Behaviors: None  Cognitive Functioning Concentration: Decreased Memory: Unable to Assess IQ: Average Insight: Unable to Assess Impulse Control: Unable to Assess Appetite:  (UTA) Weight Loss:  (UTA) Weight Gain:  (UTA) Sleep:  (UTA) Total Hours of Sleep:  (UTA) Vegetative Symptoms:  (UTA)  ADLScreening [facility] Assessment Services) Patient's cognitive ability adequate to safely complete daily activities?: Yes Patient able to express need for assistance with ADLs?: Yes Independently performs ADLs?: Yes (appropriate for developmental age)  Prior Inpatient Therapy Prior Inpatient Therapy: Yes Prior Therapy Dates: 2015, 2014 Prior Therapy Facilty/Provider(s): [facility] Reason for Treatment: MH issues  Prior Outpatient Therapy Prior Outpatient Therapy: Yes Prior Therapy Dates: 2017 (per notes) Prior Therapy Facilty/Provider(s): [facility] Reason for Treatment: MH issues Does patient have  an ACCT team?: No Does patient have Intensive In-House Services?  : No Does patient have [facility] services? : Yes Does patient have P4CC services?: No  ADL  Screening (condition at time of admission) Patient's cognitive ability adequate to safely complete daily activities?: Yes Is the patient deaf or have difficulty hearing?: No Does the patient have difficulty seeing, even when wearing glasses/contacts?: No Does the patient have difficulty concentrating, remembering, or making decisions?: Yes Patient able to express need for assistance with ADLs?: Yes Does the patient have difficulty dressing or bathing?: No Independently performs ADLs?: Yes (appropriate for developmental age) Does the patient have difficulty walking or climbing stairs?: No Weakness of Legs: None Weakness of Arms/Hands: None  Home Assistive Devices/Equipment Home Assistive Devices/Equipment: None  Therapy Consults (therapy consults require a physician order) PT Evaluation Needed: No OT Evalulation Needed: No SLP Evaluation Needed: No Abuse/Neglect Assessment (Assessment to be complete while patient is alone) Physical Abuse: Denies Verbal Abuse: Denies Sexual Abuse: Denies Exploitation of patient/patient's resources: Denies Self-Neglect: Denies Values / Beliefs Cultural Requests During Hospitalization: None Spiritual Requests During Hospitalization: None Consults Spiritual Care Consult Needed: No Social Work Consult Needed: No Regulatory affairs officer (For Healthcare) Does patient have an advance directive?: No Would patient like information on creating an advanced directive?: No - patient declined information Type of Advance Directive: Healthcare Power of Attorney, Living will Does patient want to make changes to advanced directive?: No - Patient declined Copy of advanced directive(s) in chart?: No - copy requested    Additional Information 1:1 In Past 12 Months?: No CIRT Risk: No Elopement Risk: No Does patient have medical clearance?: Yes     Disposition: Case was staffed with Reita Cliche DNP who recommended inpatient admission per IVC as appropriate bed placement is  investigated.      Disposition Initial Assessment Completed for this Encounter: Yes Disposition of Patient: Inpatient treatment program Type of inpatient treatment program: Adult  On Site Evaluation by:   Reviewed with Physician:    Mamie Nick 12/27/2015 3:43 PM

## 2015-12-27 NOTE — ED Notes (Signed)
While finishing triage, Pt began asking what I was doing and what I was writing.  Sts "you need to stay out of my business."   Pt made aggressive and racial comments to EDP.  Pt continually stated "keep that b*tch outta here.  I don't want that b*tch in here."

## 2015-12-27 NOTE — ED Notes (Signed)
Pt resting at this time, sitter at bedside.

## 2015-12-27 NOTE — ED Notes (Signed)
Lab delay -  Per RN -  Hold off on getting labs until pt calms down.  RN will notify me when that happens.

## 2015-12-27 NOTE — ED Notes (Signed)
Patient admitted to [facility] with tech and security guard.  Patient is jogging in hall and very manic on floor. Acknowledged orders. Will continue to monitor. Scrubs checked, no items were found.

## 2015-12-27 NOTE — ED Provider Notes (Signed)
[facility] DEPT Provider Note   CSN: [id number] Arrival date & time: 12/27/15  1003     History   Chief Complaint Chief Complaint  Patient presents with  . Manic Behavior  . Aggressive Behavior    HPI Keith Murray is a 55 y.o. male.  Patient is a 55 year old male with past medical history of schizoaffective disorder. Is also currently undergoing chemotherapy for lung cancer. Is apparently been off of his psychiatric medications and has been behaving aggressively and threatening family members. He lives with his 2 nieces, one of which accompanied him today. She reports that he has been physically threatening towards her and has been behaving erratically. The patient himself adds little additional history as he is extremely uncooperative, belligerent, and physically threatening toward anyone attempting to provide care.   The history is provided by the patient.    Past Medical History:  Diagnosis Date  . Anxiety   . Cancer ([place])    lung  . Polysubstance abuse    cocaine, etoh, marijuana  . Psychosis   . Schizoaffective disorder [facility])     Patient Active Problem List   Diagnosis Date Noted  . Anemia complicating neoplastic disease 12/07/2015  . Encounter for antineoplastic chemotherapy 12/07/2015  . Swelling of right upper extremity 11/07/2015  . Skin metastases ([place]) 11/06/2015  . Pain of metastatic malignancy 11/06/2015  . Malignant neoplasm of right upper lobe of lung ([place]) 11/05/2015  . Hypophosphatemia 11/04/2015  . Hypercalcemia of malignancy 10/31/2015  . Lung mass 10/31/2015  . Postobstructive pneumonia 10/31/2015  . Hypercalcemia 10/31/2015  . Leukocytosis 10/31/2015  . Cocaine abuse 08/10/2015  . Cocaine-induced mood disorder ([place]) 08/10/2015  . Alcohol abuse with intoxication ([place]) 04/14/2015  . Cocaine abuse with cocaine-induced mood disorder ([place]) 04/14/2015  . Psychoses   . Schizoaffective disorder, bipolar type ([place])   . Alcohol dependence ([place])  06/08/2013  . Polysubstance (excluding opioids) dependence ([place]) 08/08/2012    Past Surgical History:  Procedure Laterality Date  . HERNIA REPAIR         Home Medications    Prior to Admission medications   Medication Sig Start Date End Date Taking? Authorizing Provider  albuterol (PROVENTIL) (2.5 MG/3ML) 0.083% nebulizer solution Take 3 mLs (2.5 mg total) by nebulization every 6 (six) hours as needed for wheezing or shortness of breath. 11/06/15   Venetia Maxon Rama, MD  benztropine (COGENTIN) 0.5 MG tablet Take 1 tablet (0.5 mg total) by mouth 2 (two) times daily. For prevention of drug induced tremors 04/15/15   Encarnacion Slates, NP  FLUoxetine (PROZAC) 20 MG capsule Take 1 capsule (20 mg total) by mouth daily. 11/06/15   Venetia Maxon Rama, MD  guaiFENesin (MUCINEX) 600 MG 12 hr tablet Take 1 tablet (600 mg total) by mouth 2 (two) times daily. 11/06/15   Venetia Maxon Rama, MD  haloperidol (HALDOL) 5 MG tablet Take 1 tablet (5 mg total) by mouth 2 (two) times daily. For mood control 04/15/15   Encarnacion Slates, NP  HYDROcodone-acetaminophen (NORCO/VICODIN) 5-325 MG tablet Take 1-2 tablets by mouth every 4 (four) hours as needed for moderate pain. 12/07/15   Curt Bears, MD  hydrOXYzine (ATARAX/VISTARIL) 25 MG tablet Take 1 tablet (25 mg) every 6 hours as needed: Foe anxiety/sleep 04/15/15   Encarnacion Slates, NP  Multiple Vitamin (MULTIVITAMIN WITH MINERALS) TABS tablet Take 1 tablet by mouth daily. May purchase over the counter: As a Vitamin supplement 04/15/15   Encarnacion Slates, NP  naproxen (EC-NAPROSYN) 500  MG EC tablet Take 1 tablet (500 mg total) by mouth 2 (two) times daily with a meal. 11/06/15   Christina P Rama, MD  nicotine (NICODERM CQ - DOSED IN MG/24 HOURS) 21 mg/24hr patch Place 1 patch (21 mg total) onto the skin daily. 11/06/15   Venetia Maxon Rama, MD  phosphorus (K PHOS NEUTRAL) 155-852-130 MG tablet Take 1 tablet (250 mg total) by mouth 2 (two) times daily. 11/07/15   Venetia Maxon Rama, MD    prochlorperazine (COMPAZINE) 10 MG tablet Take 1 tablet (10 mg total) by mouth every 6 (six) hours as needed for nausea or vomiting. 12/07/15   Curt Bears, MD  traZODone (DESYREL) 100 MG tablet Take 1 tablet (100 mg total) by mouth at bedtime. For insomnia 04/15/15   Encarnacion Slates, NP  Wound Dressings (SONAFINE) Apply 1 application topically 2 (two) times daily.    Historical Provider, MD    Family History Family History  Problem Relation Age of Onset  . Cancer Neg Hx   . Schizophrenia Neg Hx     Social History Social History  Substance Use Topics  . Smoking status: Current Every Day Smoker    Packs/day: 0.50    Years: 4.00  . Smokeless tobacco: Never Used  . Alcohol use No     Comment: reports not drinking any longer     Allergies   Review of patient's allergies indicates no known allergies.   Review of Systems Review of Systems  All other systems reviewed and are negative.    Physical Exam Updated Vital Signs BP (!) 159/104 (BP Location: Left Arm)   Pulse 87   Temp 97.8 F (36.6 C) (Oral)   Resp 16   Ht '6\' 2"'$  (1.88 m)   Wt 162 lb (73.5 kg)   SpO2 100%   BMI 20.80 kg/m   Physical Exam  Constitutional: He is oriented to person, place, and time. He appears well-developed and well-nourished. No distress.  HENT:  Head: Normocephalic and atraumatic.  Mouth/Throat: Oropharynx is clear and moist.  Neck: Normal range of motion. Neck supple.  Pulmonary/Chest: Effort normal. No respiratory distress.  Musculoskeletal: Normal range of motion. He exhibits no edema.  Neurological: He is alert and oriented to person, place, and time. Coordination normal.  Skin: Skin is warm and dry. He is not diaphoretic.  Psychiatric: His affect is angry. His speech is rapid and/or pressured. He is agitated and aggressive. Thought content is paranoid. He expresses homicidal ideation.  Nursing note and vitals reviewed.    ED Treatments / Results  Labs (all labs ordered are  listed, but only abnormal results are displayed) Labs Reviewed  COMPREHENSIVE METABOLIC PANEL  CBC WITH DIFFERENTIAL/PLATELET  ETHANOL  URINE RAPID DRUG SCREEN, HOSP PERFORMED  URINALYSIS, ROUTINE W REFLEX MICROSCOPIC (NOT AT [facility])    EKG  EKG Interpretation None       Radiology No results found.  Procedures Procedures (including critical care time)  Medications Ordered in ED Medications  ziprasidone (GEODON) injection 20 mg (not administered)     Initial Impression / Assessment and Plan / ED Course  I have reviewed the triage vital signs and the nursing notes.  Pertinent labs & imaging results that were available during my care of the patient were reviewed by me and considered in my medical decision making (see chart for details).  Clinical Course    Patient brought by his niece for evaluation of violent and increasingly aggressive behavior. He has apparently been off of  his psychiatric medications. When I examined the patient, he was very confrontational and verbally abusive. He was threatening to myself, the authorities, and the medical staff. In my opinion, he represented a danger to himself and to ED staff and ultimately required sedation with Geodon. Laboratory studies were obtained and an involuntary commitment was implemented. He will be evaluated by psychiatry.  Final Clinical Impressions(s) / ED Diagnoses   Final diagnoses:  None    New Prescriptions New Prescriptions   No medications on file     Veryl Speak, MD 12/27/15 1501

## 2015-12-27 NOTE — ED Triage Notes (Signed)
Pt brought in by niece.  Pt reports that he does not know why he he here.  Niece reports "I'm done.  He is manic.  He has been taking all of his medication except for his haldol.  I have him $20 last night.  He left, left my door wide open, and the fire department had to bring him back this morning."  Pt has stage IV lung CA and started chemo last week.  Pt is followed by [facility] for psych needs.  Denies SI/HI/AVH.     Upon arrival to triage room, Pt became verbally aggressive w/ Pt across the hall.  Pt looked at the writer and aggressively asked "what's your problem?"

## 2015-12-28 DIAGNOSIS — Z79899 Other long term (current) drug therapy: Secondary | ICD-10-CM

## 2015-12-28 DIAGNOSIS — Z818 Family history of other mental and behavioral disorders: Secondary | ICD-10-CM

## 2015-12-28 DIAGNOSIS — F250 Schizoaffective disorder, bipolar type: Secondary | ICD-10-CM

## 2015-12-28 DIAGNOSIS — F1490 Cocaine use, unspecified, uncomplicated: Secondary | ICD-10-CM

## 2015-12-28 DIAGNOSIS — F1910 Other psychoactive substance abuse, uncomplicated: Secondary | ICD-10-CM

## 2015-12-28 DIAGNOSIS — Z809 Family history of malignant neoplasm, unspecified: Secondary | ICD-10-CM | POA: Diagnosis not present

## 2015-12-28 DIAGNOSIS — F1290 Cannabis use, unspecified, uncomplicated: Secondary | ICD-10-CM

## 2015-12-28 DIAGNOSIS — F17210 Nicotine dependence, cigarettes, uncomplicated: Secondary | ICD-10-CM

## 2015-12-28 MED ORDER — HYDROCODONE-ACETAMINOPHEN 5-325 MG PO TABS
1.0000 | ORAL_TABLET | Freq: Once | ORAL | Status: AC
  Administered 2015-12-28: 1 via ORAL
  Filled 2015-12-28: qty 1

## 2015-12-28 MED ORDER — HALOPERIDOL 5 MG PO TABS
5.0000 mg | ORAL_TABLET | Freq: Two times a day (BID) | ORAL | Status: DC
  Administered 2015-12-28: 5 mg via ORAL
  Filled 2015-12-28: qty 1

## 2015-12-28 MED ORDER — GABAPENTIN 100 MG PO CAPS
200.0000 mg | ORAL_CAPSULE | Freq: Three times a day (TID) | ORAL | Status: DC
  Administered 2015-12-28: 200 mg via ORAL
  Filled 2015-12-28: qty 2

## 2015-12-28 MED ORDER — TRAZODONE HCL 100 MG PO TABS: 100.0000 mg | ORAL_TABLET | Freq: Every day | ORAL | 1 refills | Status: DC

## 2015-12-28 MED ORDER — BENZTROPINE MESYLATE 0.5 MG PO TABS: 0.5000 mg | ORAL_TABLET | Freq: Two times a day (BID) | ORAL | 1 refills | Status: DC

## 2015-12-28 MED ORDER — GABAPENTIN 300 MG PO CAPS: 300.0000 mg | ORAL_CAPSULE | Freq: Three times a day (TID) | ORAL | 1 refills | Status: DC

## 2015-12-28 MED ORDER — FLUOXETINE HCL 20 MG PO CAPS: 20.0000 mg | ORAL_CAPSULE | Freq: Every day | ORAL | 1 refills | Status: DC

## 2015-12-28 MED ORDER — HALOPERIDOL 5 MG PO TABS: 5.0000 mg | ORAL_TABLET | Freq: Two times a day (BID) | ORAL | 1 refills | Status: DC

## 2015-12-28 NOTE — ED Notes (Signed)
Pt discharged home. Discharged instructions read to pt who verbalized understanding. All belongings returned to pt who signed for same. Denies SI/HI, is not delusional and not responding to internal stimuli. Escorted pt to the ED exit.    

## 2015-12-28 NOTE — Consult Note (Signed)
[facility] Consult   Reason for Consult:  Manic, agitation Referring Physician:  EDP Patient Identification: Keith Murray MRN:  [id number] Principal Diagnosis: Schizoaffective disorder, bipolar type [facility]) Diagnosis:   Patient Active Problem List   Diagnosis Date Noted  . Cocaine abuse [F14.10] 08/10/2015    Priority: High  . Cocaine-induced mood disorder ([place]) [F14.94] 08/10/2015    Priority: High  . Schizoaffective disorder, bipolar type ([place]) [F25.0]     Priority: High  . Alcohol dependence ([place]) [F10.20] 06/08/2013    Priority: Low  . Polysubstance (excluding opioids) dependence ([place]) [F19.20] 08/08/2012    Priority: Low  . Anemia complicating neoplastic disease [D63.0] 12/07/2015  . Encounter for antineoplastic chemotherapy [Z51.11] 12/07/2015  . Swelling of right upper extremity [M79.89] 11/07/2015  . Skin metastases ([place]) [C79.2] 11/06/2015  . Pain of metastatic malignancy [G89.3] 11/06/2015  . Malignant neoplasm of right upper lobe of lung ([place]) [C34.11] 11/05/2015  . Hypophosphatemia [E83.39] 11/04/2015  . Hypercalcemia of malignancy [E83.52] 10/31/2015  . Lung mass [R91.8] 10/31/2015  . Postobstructive pneumonia [J18.9] 10/31/2015  . Hypercalcemia [E83.52] 10/31/2015  . Leukocytosis [D72.829] 10/31/2015  . Alcohol abuse with intoxication ([place]) [F10.129] 04/14/2015  . Cocaine abuse with cocaine-induced mood disorder ([place]) [F14.14] 04/14/2015  . Psychoses [F29]     Total Time spent with patient: 45 minutes  Subjective:   Keith Murray is a 55 y.o. male patient does not warrant admission.  HPI:  55 yo male who presented to the ED with agitation and manic symptoms.  He was given agitation medications and calmed down.  Evidently, he had been using some cocaine that set him off.  Today, he is calm, cooperative, and polite.  He has been compliant with his medications and plans to continue to be compliant and avoid cocaine.  Denies suicidal/homicidal  ideations, hallucinations, and withdrawal symptoms.  Past Psychiatric History: schizoaffective disorder, substance abuse  Risk to Self: Suicidal Ideation: No (pt denies) Suicidal Intent: No (per patient) Is patient at risk for suicide?: No Suicidal Plan?: No Access to Means: No What has been your use of drugs/alcohol within the last 12 months?: pt denies How many times?: 0 Other Self Harm Risks: none Triggers for Past Attempts: Unknown Intentional Self Injurious Behavior: None Risk to Others: Homicidal Ideation: No Thoughts of Harm to Others: Yes-Currently Present Comment - Thoughts of Harm to Others: pt threatened staff on admission Current Homicidal Intent: Yes-Currently Present Current Homicidal Plan: Yes-Currently Present Describe Current Homicidal Plan: pt stated he would "kill" staff Access to Homicidal Means: No Identified Victim: na History of harm to others?: No (per patient) Assessment of Violence: On admission Violent Behavior Description: pt refused to dress out  Does patient have access to weapons?: No Criminal Charges Pending?:  (unknown) Does patient have a court date:  ([place]) Prior Inpatient Therapy: Prior Inpatient Therapy: Yes Prior Therapy Dates: 2015, 2014 Prior Therapy Facilty/Provider(s): [facility] Reason for Treatment: MH issues Prior Outpatient Therapy: Prior Outpatient Therapy: Yes Prior Therapy Dates: 2017 (per notes) Prior Therapy Facilty/Provider(s): [facility] Reason for Treatment: MH issues Does patient have an ACCT team?: No Does patient have Intensive In-House Services?  : No Does patient have [facility] services? : Yes Does patient have P4CC services?: No  Past Medical History:  Past Medical History:  Diagnosis Date  . Anxiety   . Cancer ([place])    lung  . Polysubstance abuse    cocaine, etoh, marijuana  . Psychosis   . Schizoaffective disorder [facility])     Past Surgical  History:  Procedure Laterality Date  . HERNIA REPAIR     Family History:   Family History  Problem Relation Age of Onset  . Cancer Neg Hx   . Schizophrenia Neg Hx    Family Psychiatric  History: none Social History:  History  Alcohol Use No    Comment: reports not drinking any longer     History  Drug Use  . Types: Cocaine, Marijuana    Comment: THC about once per week and cocaine on occasion.     Social History   Social History  . Marital status: Single    Spouse name: N/A  . Number of children: N/A  . Years of education: N/A   Social History Main Topics  . Smoking status: Current Every Day Smoker    Packs/day: 0.50    Years: 4.00  . Smokeless tobacco: Never Used  . Alcohol use No     Comment: reports not drinking any longer  . Drug use:     Types: Cocaine, Marijuana     Comment: THC about once per week and cocaine on occasion.   . Sexual activity: Yes    Birth control/ protection: Condom   Other Topics Concern  . None   Social History Narrative  . None   Additional Social History:    Allergies:  No Known Allergies  Labs:  Results for orders placed or performed during the hospital encounter of 12/27/15 (from the past 48 hour(s))  Comprehensive metabolic panel     Status: Abnormal   Collection Time: 12/27/15 12:05 PM  Result Value Ref Range   Sodium 142 135 - 145 mmol/L   Potassium 3.6 3.5 - 5.1 mmol/L   Chloride 110 101 - 111 mmol/L   CO2 26 22 - 32 mmol/L   Glucose, Bld 78 65 - 99 mg/dL   BUN 12 6 - 20 mg/dL   Creatinine, Ser 0.87 0.61 - 1.24 mg/dL   Calcium 9.2 8.9 - 10.3 mg/dL   Total Protein 8.3 (H) 6.5 - 8.1 g/dL   Albumin 3.9 3.5 - 5.0 g/dL   AST 14 (L) 15 - 41 U/L   ALT 14 (L) 17 - 63 U/L   Alkaline Phosphatase 215 (H) 38 - 126 U/L   Total Bilirubin 0.7 0.3 - 1.2 mg/dL   GFR calc non Af Amer >60 >60 mL/min   GFR calc Af Amer >60 >60 mL/min    Comment: (NOTE) The eGFR has been calculated using the CKD EPI equation. This calculation has not been validated in all clinical situations. eGFR's persistently <60  mL/min signify possible Chronic Kidney Disease.    Anion gap 6 5 - 15  CBC with Differential     Status: Abnormal   Collection Time: 12/27/15 12:05 PM  Result Value Ref Range   WBC 12.2 (H) 4.0 - 10.5 K/uL   RBC 3.51 (L) 4.22 - 5.81 MIL/uL   Hemoglobin 9.8 (L) 13.0 - 17.0 g/dL   HCT 31.2 (L) 39.0 - 52.0 %   MCV 88.9 78.0 - 100.0 fL   MCH 27.9 26.0 - 34.0 pg   MCHC 31.4 30.0 - 36.0 g/dL   RDW 16.2 (H) 11.5 - 15.5 %   Platelets 372 150 - 400 K/uL   Neutrophils Relative % 83 %   Neutro Abs 10.2 (H) 1.7 - 7.7 K/uL   Lymphocytes Relative 5 %   Lymphs Abs 0.6 (L) 0.7 - 4.0 K/uL   Monocytes Relative 10 %   Monocytes Absolute  1.2 (H) 0.1 - 1.0 K/uL   Eosinophils Relative 2 %   Eosinophils Absolute 0.2 0.0 - 0.7 K/uL   Basophils Relative 0 %   Basophils Absolute 0.0 0.0 - 0.1 K/uL  Ethanol     Status: None   Collection Time: 12/27/15 12:05 PM  Result Value Ref Range   Alcohol, Ethyl (B) <5 <5 mg/dL    Comment:        LOWEST DETECTABLE LIMIT FOR SERUM ALCOHOL IS 5 mg/dL FOR MEDICAL PURPOSES ONLY   Urine rapid drug screen (hosp performed)     Status: Abnormal   Collection Time: 12/27/15 12:15 PM  Result Value Ref Range   Opiates NONE DETECTED NONE DETECTED   Cocaine POSITIVE (A) NONE DETECTED   Benzodiazepines NONE DETECTED NONE DETECTED   Amphetamines NONE DETECTED NONE DETECTED   Tetrahydrocannabinol POSITIVE (A) NONE DETECTED   Barbiturates NONE DETECTED NONE DETECTED    Comment:        DRUG SCREEN FOR MEDICAL PURPOSES ONLY.  IF CONFIRMATION IS NEEDED FOR ANY PURPOSE, NOTIFY LAB WITHIN 5 DAYS.        LOWEST DETECTABLE LIMITS FOR URINE DRUG SCREEN Drug Class       Cutoff (ng/mL) Amphetamine      1000 Barbiturate      200 Benzodiazepine   086 Tricyclics       578 Opiates          300 Cocaine          300 THC              50   Urinalysis, Routine w reflex microscopic     Status: Abnormal   Collection Time: 12/27/15 12:15 PM  Result Value Ref Range   Color, Urine  YELLOW YELLOW   APPearance CLEAR CLEAR   Specific Gravity, Urine 1.003 (L) 1.005 - 1.030   pH 6.0 5.0 - 8.0   Glucose, UA NEGATIVE NEGATIVE mg/dL   Hgb urine dipstick NEGATIVE NEGATIVE   Bilirubin Urine NEGATIVE NEGATIVE   Ketones, ur NEGATIVE NEGATIVE mg/dL   Protein, ur NEGATIVE NEGATIVE mg/dL   Nitrite NEGATIVE NEGATIVE   Leukocytes, UA NEGATIVE NEGATIVE    Comment: MICROSCOPIC NOT DONE ON URINES WITH NEGATIVE PROTEIN, BLOOD, LEUKOCYTES, NITRITE, OR GLUCOSE <1000 mg/dL.    Current Facility-Administered Medications  Medication Dose Route Frequency Provider Last Rate Last Dose  . asenapine (SAPHRIS) sublingual tablet 5 mg  5 mg Sublingual BID Patrecia Pour, NP   5 mg at 12/28/15 0933  . benztropine (COGENTIN) tablet 0.5 mg  0.5 mg Oral BID Patrecia Pour, NP   0.5 mg at 12/28/15 0933  . FLUoxetine (PROZAC) capsule 20 mg  20 mg Oral Daily Patrecia Pour, NP   20 mg at 12/28/15 0933  . traZODone (DESYREL) tablet 100 mg  100 mg Oral QHS Patrecia Pour, NP   100 mg at 12/27/15 2111   Current Outpatient Prescriptions  Medication Sig Dispense Refill  . albuterol (PROVENTIL) (2.5 MG/3ML) 0.083% nebulizer solution Take 3 mLs (2.5 mg total) by nebulization every 6 (six) hours as needed for wheezing or shortness of breath. 75 mL 12  . benztropine (COGENTIN) 0.5 MG tablet Take 1 tablet (0.5 mg total) by mouth 2 (two) times daily. For prevention of drug induced tremors 60 tablet 0  . CVS VITAMIN B12 1000 MCG tablet Take 1,000 mcg by mouth daily.  0  . FLUoxetine (PROZAC) 20 MG capsule Take 1 capsule (20 mg total) by  mouth daily. 30 capsule 3  . folic acid (FOLVITE) 1 MG tablet Take 1 mg by mouth daily.  3  . gabapentin (NEURONTIN) 300 MG capsule Take 300 mg by mouth 2 (two) times daily.    . haloperidol (HALDOL) 5 MG tablet Take 1 tablet (5 mg total) by mouth 2 (two) times daily. For mood control 60 tablet 0  . HYDROcodone-acetaminophen (NORCO/VICODIN) 5-325 MG tablet Take 1-2 tablets by mouth  every 4 (four) hours as needed for moderate pain. 30 tablet 0  . Multiple Vitamin (MULTIVITAMIN WITH MINERALS) TABS tablet Take 1 tablet by mouth daily. May purchase over the counter: As a Vitamin supplement    . naproxen (EC-NAPROSYN) 500 MG EC tablet Take 1 tablet (500 mg total) by mouth 2 (two) times daily with a meal. 60 tablet 2  . nicotine (NICODERM CQ - DOSED IN MG/24 HOURS) 21 mg/24hr patch Place 1 patch (21 mg total) onto the skin daily. (Patient taking differently: Place 21 mg onto the skin daily as needed (smocking sesation). ) 28 patch 0  . phosphorus (K PHOS NEUTRAL) 155-852-130 MG tablet Take 1 tablet (250 mg total) by mouth 2 (two) times daily. 60 tablet 0  . prochlorperazine (COMPAZINE) 10 MG tablet Take 1 tablet (10 mg total) by mouth every 6 (six) hours as needed for nausea or vomiting. 30 tablet 0  . traZODone (DESYREL) 100 MG tablet Take 1 tablet (100 mg total) by mouth at bedtime. For insomnia 30 tablet 0  . guaiFENesin (MUCINEX) 600 MG 12 hr tablet Take 1 tablet (600 mg total) by mouth 2 (two) times daily. (Patient not taking: Reported on 12/27/2015) 60 tablet 2  . hydrOXYzine (ATARAX/VISTARIL) 25 MG tablet Take 1 tablet (25 mg) every 6 hours as needed: Foe anxiety/sleep (Patient not taking: Reported on 12/27/2015) 60 tablet 0    Musculoskeletal: Strength & Muscle Tone: within normal limits Gait & Station: normal Patient leans: N/A  Psychiatric Specialty Exam: Physical Exam  Constitutional: He is oriented to person, place, and time. He appears well-developed and well-nourished.  HENT:  Head: Normocephalic.  Neck: Normal range of motion.  Respiratory: Effort normal.  Musculoskeletal: Normal range of motion.  Neurological: He is alert and oriented to person, place, and time.  Skin: Skin is warm and dry.  Psychiatric: He has a normal mood and affect. His speech is normal and behavior is normal. Judgment and thought content normal. Cognition and memory are normal.     Review of Systems  Constitutional: Negative.   HENT: Negative.   Eyes: Negative.   Respiratory: Negative.   Cardiovascular: Negative.   Gastrointestinal: Negative.   Genitourinary: Negative.   Musculoskeletal: Negative.   Skin: Negative.   Neurological: Negative.   Endo/Heme/Allergies: Negative.   Psychiatric/Behavioral: Positive for depression.    Blood pressure 117/95, pulse 89, temperature 98.3 F (36.8 C), temperature source Oral, resp. rate 17, height 6\' 2"  (1.88 m), weight 73.5 kg (162 lb), SpO2 100 %.Body mass index is 20.8 kg/m.  General Appearance: Casual  Eye Contact:  Good  Speech:  Normal Rate  Volume:  Normal  Mood:  Euthymic  Affect:  Congruent  Thought Process:  Coherent and Descriptions of Associations: Intact  Orientation:  Full (Time, Place, and Person)  Thought Content:  WDL  Suicidal Thoughts:  No  Homicidal Thoughts:  No  Memory:  Immediate;   Good Recent;   Good Remote;   Good  Judgement:  Fair  Insight:  Fair  Psychomotor Activity:  Normal  Concentration:  Concentration: Good and Attention Span: Good  Recall:  Good  Fund of Knowledge:  Good  Language:  Good  Akathisia:  No  Handed:  Right  AIMS (if indicated):     Assets:  Housing Leisure Time Physical Health Resilience Social Support  ADL's:  Intact  Cognition:  WNL  Sleep:        Treatment Plan Summary: Daily contact with patient to assess and evaluate symptoms and progress in treatment, Medication management and Plan schizoaffective disorder, bipolar type:  -Crisis stabilization -Medication management:  Geodon 20 mg IM once for agitation.  Continue Cogentin 0.5 mg BID for EPS, Prozac 20 mg daily for depression, Trazodone 100 mg at bedtime for sleep.  Stopped Haldol on admission and started Saphris 5 mg BID for agitation and mood.  Changed back to his Haldol 5 mg BID for psychosis at discharge per patient request.  Increased Gabapentin 300 mg BID to TID for cocaine dependence. -Rx  provided -Individual counseling  Disposition: No evidence of imminent risk to self or others at present.    Waylan Boga, NP 12/28/2015 10:01 AM  Patient seen face-to-face for psychiatric evaluation, chart reviewed and case discussed with the physician extender and developed treatment plan. Reviewed the information documented and agree with the treatment plan. Corena Pilgrim, MD

## 2015-12-28 NOTE — [facility] Suicide Risk Assessment (Signed)
Suicide Risk Assessment  Discharge Assessment   [facility] Discharge Suicide Risk Assessment   Principal Problem: Schizoaffective disorder, bipolar type [facility]) Discharge Diagnoses:  Patient Active Problem List   Diagnosis Date Noted  . Cocaine abuse [F14.10] 08/10/2015    Priority: High  . Cocaine-induced mood disorder ([place]) [F14.94] 08/10/2015    Priority: High  . Schizoaffective disorder, bipolar type ([place]) [F25.0]     Priority: High  . Alcohol dependence ([place]) [F10.20] 06/08/2013    Priority: Low  . Polysubstance (excluding opioids) dependence ([place]) [F19.20] 08/08/2012    Priority: Low  . Anemia complicating neoplastic disease [D63.0] 12/07/2015  . Encounter for antineoplastic chemotherapy [Z51.11] 12/07/2015  . Swelling of right upper extremity [M79.89] 11/07/2015  . Skin metastases ([place]) [C79.2] 11/06/2015  . Pain of metastatic malignancy [G89.3] 11/06/2015  . Malignant neoplasm of right upper lobe of lung ([place]) [C34.11] 11/05/2015  . Hypophosphatemia [E83.39] 11/04/2015  . Hypercalcemia of malignancy [E83.52] 10/31/2015  . Lung mass [R91.8] 10/31/2015  . Postobstructive pneumonia [J18.9] 10/31/2015  . Hypercalcemia [E83.52] 10/31/2015  . Leukocytosis [D72.829] 10/31/2015  . Alcohol abuse with intoxication ([place]) [F10.129] 04/14/2015  . Cocaine abuse with cocaine-induced mood disorder ([place]) [F14.14] 04/14/2015  . Psychoses [F29]     Total Time spent with patient: 45 minutes  Musculoskeletal: Strength & Muscle Tone: within normal limits Gait & Station: normal Patient leans: N/A  Psychiatric Specialty Exam: Physical Exam  Constitutional: He is oriented to person, place, and time. He appears well-developed and well-nourished.  HENT:  Head: Normocephalic.  Neck: Normal range of motion.  Respiratory: Effort normal.  Musculoskeletal: Normal range of motion.  Neurological: He is alert and oriented to person, place, and time.  Skin: Skin is warm and dry.  Psychiatric: He has  a normal mood and affect. His speech is normal and behavior is normal. Judgment and thought content normal. Cognition and memory are normal.    Review of Systems  Constitutional: Negative.   HENT: Negative.   Eyes: Negative.   Respiratory: Negative.   Cardiovascular: Negative.   Gastrointestinal: Negative.   Genitourinary: Negative.   Musculoskeletal: Negative.   Skin: Negative.   Neurological: Negative.   Endo/Heme/Allergies: Negative.   Psychiatric/Behavioral: Positive for depression.    Blood pressure 117/95, pulse 89, temperature 98.3 F (36.8 C), temperature source Oral, resp. rate 17, height '6\' 2"'$  (1.88 m), weight 73.5 kg (162 lb), SpO2 100 %.Body mass index is 20.8 kg/m.  General Appearance: Casual  Eye Contact:  Good  Speech:  Normal Rate  Volume:  Normal  Mood:  Euthymic  Affect:  Congruent  Thought Process:  Coherent and Descriptions of Associations: Intact  Orientation:  Full (Time, Place, and Person)  Thought Content:  WDL  Suicidal Thoughts:  No  Homicidal Thoughts:  No  Memory:  Immediate;   Good Recent;   Good Remote;   Good  Judgement:  Fair  Insight:  Fair  Psychomotor Activity:  Normal  Concentration:  Concentration: Good and Attention Span: Good  Recall:  Good  Fund of Knowledge:  Good  Language:  Good  Akathisia:  No  Handed:  Right  AIMS (if indicated):     Assets:  Housing Leisure Time Physical Health Resilience Social Support  ADL's:  Intact  Cognition:  WNL  Sleep:      Mental Status Per Nursing Assessment::   On Admission:    agitation, mania  Demographic Factors:  Male  Loss Factors: Decline in physical health  Historical Factors: NA  Risk  Reduction Factors:   Sense of responsibility to family, Living with another person, especially a relative, Positive social support and Positive therapeutic relationship  Continued Clinical Symptoms:  None  Cognitive Features That Contribute To Risk:  None    Suicide Risk:  Minimal:  No identifiable suicidal ideation.  Patients presenting with no risk factors but with morbid ruminations; may be classified as minimal risk based on the severity of the depressive symptoms    Plan Of Care/Follow-up recommendations:  Activity:  as tolerated Diet:  heart healthy diet  LORD, JAMISON, NP 12/28/2015, 11:10 AM

## 2015-12-28 NOTE — ED Notes (Signed)
Pt's affect, mood and behavior have been appropriate this morning. He is cooperative. Denies SI/HI, AVH, and states that he is ready to go home. He took his medications.

## 2015-12-30 ENCOUNTER — Other Ambulatory Visit (HOSPITAL_BASED_OUTPATIENT_CLINIC_OR_DEPARTMENT_OTHER): Payer: Medicare Other

## 2015-12-30 DIAGNOSIS — C3411 Malignant neoplasm of upper lobe, right bronchus or lung: Secondary | ICD-10-CM | POA: Diagnosis not present

## 2015-12-30 LAB — COMPREHENSIVE METABOLIC PANEL
ALT: 9 U/L (ref 0–55)
AST: 12 U/L (ref 5–34)
Albumin: 3.2 g/dL — ABNORMAL LOW (ref 3.5–5.0)
Alkaline Phosphatase: 227 U/L — ABNORMAL HIGH (ref 40–150)
Anion Gap: 9 mEq/L (ref 3–11)
BUN: 11.7 mg/dL (ref 7.0–26.0)
CO2: 22 mEq/L (ref 22–29)
Calcium: 9.3 mg/dL (ref 8.4–10.4)
Chloride: 109 mEq/L (ref 98–109)
Creatinine: 0.9 mg/dL (ref 0.7–1.3)
EGFR: >90 mL/min/{1.73_m2} (ref >90)
Glucose: 91 mg/dl (ref 70–140)
Potassium: 4.5 mEq/L (ref 3.5–5.1)
Sodium: 140 mEq/L (ref 136–145)
Total Bilirubin: 0.33 mg/dL (ref 0.20–1.20)
Total Protein: 7.9 g/dL (ref 6.4–8.3)

## 2015-12-30 LAB — CBC WITH DIFFERENTIAL/PLATELET
BASO%: 0.3 % (ref 0.0–2.0)
Basophils Absolute: 0 10*3/uL (ref 0.0–0.1)
EOS%: 1.5 % (ref 0.0–7.0)
Eosinophils Absolute: 0.1 10*3/uL (ref 0.0–0.5)
HCT: 33.3 % — ABNORMAL LOW (ref 38.4–49.9)
HGB: 10.5 g/dL — ABNORMAL LOW (ref 13.0–17.1)
LYMPH%: 15.9 % (ref 14.0–49.0)
MCH: 28 pg (ref 27.2–33.4)
MCHC: 31.5 g/dL — ABNORMAL LOW (ref 32.0–36.0)
MCV: 88.8 fL (ref 79.3–98.0)
MONO#: 0.6 10*3/uL (ref 0.1–0.9)
MONO%: 9 % (ref 0.0–14.0)
NEUT#: 4.8 10*3/uL (ref 1.5–6.5)
NEUT%: 73.3 % (ref 39.0–75.0)
Platelets: 329 10*3/uL (ref 140–400)
RBC: 3.75 10*6/uL — ABNORMAL LOW (ref 4.20–5.82)
RDW: 16.9 % — ABNORMAL HIGH (ref 11.0–14.6)
WBC: 6.6 10*3/uL (ref 4.0–10.3)
lymph#: 1 10*3/uL (ref 0.9–3.3)

## 2016-01-06 ENCOUNTER — Other Ambulatory Visit (HOSPITAL_BASED_OUTPATIENT_CLINIC_OR_DEPARTMENT_OTHER): Payer: Medicare Other

## 2016-01-06 ENCOUNTER — Ambulatory Visit (HOSPITAL_BASED_OUTPATIENT_CLINIC_OR_DEPARTMENT_OTHER): Payer: Medicare Other

## 2016-01-06 ENCOUNTER — Ambulatory Visit (HOSPITAL_BASED_OUTPATIENT_CLINIC_OR_DEPARTMENT_OTHER): Payer: Medicare Other | Admitting: Internal Medicine

## 2016-01-06 ENCOUNTER — Encounter: Payer: Self-pay | Admitting: Internal Medicine

## 2016-01-06 ENCOUNTER — Telehealth: Payer: Self-pay | Admitting: Internal Medicine

## 2016-01-06 VITALS — BP 121/91 | HR 87 | Temp 97.6°F | Resp 18 | Ht 74.0 in | Wt 164.8 lb

## 2016-01-06 DIAGNOSIS — C3411 Malignant neoplasm of upper lobe, right bronchus or lung: Secondary | ICD-10-CM

## 2016-01-06 DIAGNOSIS — C7989 Secondary malignant neoplasm of other specified sites: Secondary | ICD-10-CM | POA: Diagnosis not present

## 2016-01-06 DIAGNOSIS — R918 Other nonspecific abnormal finding of lung field: Secondary | ICD-10-CM

## 2016-01-06 DIAGNOSIS — J189 Pneumonia, unspecified organism: Secondary | ICD-10-CM | POA: Diagnosis not present

## 2016-01-06 DIAGNOSIS — J90 Pleural effusion, not elsewhere classified: Secondary | ICD-10-CM | POA: Diagnosis not present

## 2016-01-06 DIAGNOSIS — Z5111 Encounter for antineoplastic chemotherapy: Secondary | ICD-10-CM | POA: Diagnosis not present

## 2016-01-06 DIAGNOSIS — Z23 Encounter for immunization: Secondary | ICD-10-CM | POA: Diagnosis not present

## 2016-01-06 LAB — CBC WITH DIFFERENTIAL/PLATELET
BASO%: 0.9 % (ref 0.0–2.0)
Basophils Absolute: 0.1 10*3/uL (ref 0.0–0.1)
EOS%: 2.7 % (ref 0.0–7.0)
Eosinophils Absolute: 0.2 10*3/uL (ref 0.0–0.5)
HCT: 31.5 % — ABNORMAL LOW (ref 38.4–49.9)
HGB: 9.9 g/dL — ABNORMAL LOW (ref 13.0–17.1)
LYMPH%: 16 % (ref 14.0–49.0)
MCH: 27.6 pg (ref 27.2–33.4)
MCHC: 31.4 g/dL — ABNORMAL LOW (ref 32.0–36.0)
MCV: 87.7 fL (ref 79.3–98.0)
MONO#: 0.5 10*3/uL (ref 0.1–0.9)
MONO%: 9.3 % (ref 0.0–14.0)
NEUT#: 3.9 10*3/uL (ref 1.5–6.5)
NEUT%: 71.1 % (ref 39.0–75.0)
Platelets: 194 10*3/uL (ref 140–400)
RBC: 3.59 10*6/uL — ABNORMAL LOW (ref 4.20–5.82)
RDW: 16.7 % — ABNORMAL HIGH (ref 11.0–14.6)
WBC: 5.5 10*3/uL (ref 4.0–10.3)
lymph#: 0.9 10*3/uL (ref 0.9–3.3)

## 2016-01-06 LAB — COMPREHENSIVE METABOLIC PANEL
ALT: <6 U/L (ref 0–55)
AST: 8 U/L (ref 5–34)
Albumin: 3.3 g/dL — ABNORMAL LOW (ref 3.5–5.0)
Alkaline Phosphatase: 171 U/L — ABNORMAL HIGH (ref 40–150)
Anion Gap: 9 mEq/L (ref 3–11)
BUN: 13.8 mg/dL (ref 7.0–26.0)
CO2: 19 mEq/L — ABNORMAL LOW (ref 22–29)
Calcium: 9.1 mg/dL (ref 8.4–10.4)
Chloride: 113 mEq/L — ABNORMAL HIGH (ref 98–109)
Creatinine: 0.8 mg/dL (ref 0.7–1.3)
EGFR: >90 mL/min/{1.73_m2} (ref >90)
Glucose: 121 mg/dl (ref 70–140)
Potassium: 3.9 mEq/L (ref 3.5–5.1)
Sodium: 141 mEq/L (ref 136–145)
Total Bilirubin: 0.27 mg/dL (ref 0.20–1.20)
Total Protein: 7.3 g/dL (ref 6.4–8.3)

## 2016-01-06 MED ORDER — DIPHENHYDRAMINE HCL 50 MG/ML IJ SOLN
INTRAMUSCULAR | Status: AC
  Filled 2016-01-06: qty 1

## 2016-01-06 MED ORDER — FAMOTIDINE IN NACL 20-0.9 MG/50ML-% IV SOLN
INTRAVENOUS | Status: AC
  Filled 2016-01-06: qty 50

## 2016-01-06 MED ORDER — SODIUM CHLORIDE 0.9 % IV SOLN
20.0000 mg | Freq: Once | INTRAVENOUS | Status: AC
  Administered 2016-01-06: 20 mg via INTRAVENOUS
  Filled 2016-01-06: qty 2

## 2016-01-06 MED ORDER — SODIUM CHLORIDE 0.9 % IV SOLN
175.0000 mg/m2 | Freq: Once | INTRAVENOUS | Status: AC
  Administered 2016-01-06: 354 mg via INTRAVENOUS
  Filled 2016-01-06: qty 59

## 2016-01-06 MED ORDER — DIPHENHYDRAMINE HCL 50 MG/ML IJ SOLN
50.0000 mg | Freq: Once | INTRAMUSCULAR | Status: AC
  Administered 2016-01-06: 50 mg via INTRAVENOUS

## 2016-01-06 MED ORDER — FAMOTIDINE IN NACL 20-0.9 MG/50ML-% IV SOLN
20.0000 mg | Freq: Once | INTRAVENOUS | Status: AC
  Administered 2016-01-06: 20 mg via INTRAVENOUS

## 2016-01-06 MED ORDER — INFLUENZA VAC SPLIT QUAD 0.5 ML IM SUSY
0.5000 mL | PREFILLED_SYRINGE | Freq: Once | INTRAMUSCULAR | Status: AC
  Administered 2016-01-06: 0.5 mL via INTRAMUSCULAR
  Filled 2016-01-06: qty 0.5

## 2016-01-06 MED ORDER — PALONOSETRON HCL INJECTION 0.25 MG/5ML
INTRAVENOUS | Status: AC
  Filled 2016-01-06: qty 5

## 2016-01-06 MED ORDER — PEGFILGRASTIM 6 MG/0.6ML ~~LOC~~ PSKT
6.0000 mg | PREFILLED_SYRINGE | Freq: Once | SUBCUTANEOUS | Status: AC
  Administered 2016-01-06: 6 mg via SUBCUTANEOUS
  Filled 2016-01-06: qty 0.6

## 2016-01-06 MED ORDER — SODIUM CHLORIDE 0.9 % IV SOLN
Freq: Once | INTRAVENOUS | Status: AC
  Administered 2016-01-06: 11:00:00 via INTRAVENOUS

## 2016-01-06 MED ORDER — CARBOPLATIN CHEMO INJECTION 600 MG/60ML
709.0000 mg | Freq: Once | INTRAVENOUS | Status: AC
  Administered 2016-01-06: 710 mg via INTRAVENOUS
  Filled 2016-01-06: qty 71

## 2016-01-06 MED ORDER — PALONOSETRON HCL INJECTION 0.25 MG/5ML
0.2500 mg | Freq: Once | INTRAVENOUS | Status: AC
Start: 2016-01-06 — End: 2016-01-06
  Administered 2016-01-06: 0.25 mg via INTRAVENOUS

## 2016-01-06 NOTE — Patient Instructions (Signed)
Smoking Cessation, Tips for Success If you are ready to quit smoking, congratulations! You have chosen to help yourself be healthier. Cigarettes bring nicotine, tar, carbon monoxide, and other irritants into your body. Your lungs, heart, and blood vessels will be able to work better without these poisons. There are many different ways to quit smoking. Nicotine gum, nicotine patches, a nicotine inhaler, or nicotine nasal spray can help with physical craving. Hypnosis, support groups, and medicines help break the habit of smoking. WHAT THINGS CAN I DO TO MAKE QUITTING EASIER?  Here are some tips to help you quit for good:  Pick a date when you will quit smoking completely. Tell all of your friends and family about your plan to quit on that date.  Do not try to slowly cut down on the number of cigarettes you are smoking. Pick a quit date and quit smoking completely starting on that day.  Throw away all cigarettes.   Clean and remove all ashtrays from your home, work, and car.  On a card, write down your reasons for quitting. Carry the card with you and read it when you get the urge to smoke.  Cleanse your body of nicotine. Drink enough water and fluids to keep your urine clear or pale yellow. Do this after quitting to flush the nicotine from your body.  Learn to predict your moods. Do not let a bad situation be your excuse to have a cigarette. Some situations in your life might tempt you into wanting a cigarette.  Never have "just one" cigarette. It leads to wanting another and another. Remind yourself of your decision to quit.  Change habits associated with smoking. If you smoked while driving or when feeling stressed, try other activities to replace smoking. Stand up when drinking your coffee. Brush your teeth after eating. Sit in a different chair when you read the paper. Avoid alcohol while trying to quit, and try to drink fewer caffeinated beverages. Alcohol and caffeine may urge you to  smoke.  Avoid foods and drinks that can trigger a desire to smoke, such as sugary or spicy foods and alcohol.  Ask people who smoke not to smoke around you.  Have something planned to do right after eating or having a cup of coffee. For example, plan to take a walk or exercise.  Try a relaxation exercise to calm you down and decrease your stress. Remember, you may be tense and nervous for the first 2 weeks after you quit, but this will pass.  Find new activities to keep your hands busy. Play with a pen, coin, or rubber band. Doodle or draw things on paper.  Brush your teeth right after eating. This will help cut down on the craving for the taste of tobacco after meals. You can also try mouthwash.   Use oral substitutes in place of cigarettes. Try using lemon drops, carrots, cinnamon sticks, or chewing gum. Keep them handy so they are available when you have the urge to smoke.  When you have the urge to smoke, try deep breathing.  Designate your home as a nonsmoking area.  If you are a heavy smoker, ask your health care provider about a prescription for nicotine chewing gum. It can ease your withdrawal from nicotine.  Reward yourself. Set aside the cigarette money you save and buy yourself something nice.  Look for support from others. Join a support group or smoking cessation program. Ask someone at home or at work to help you with your plan   to quit smoking.  Always ask yourself, "Do I need this cigarette or is this just a reflex?" Tell yourself, "Today, I choose not to smoke," or "I do not want to smoke." You are reminding yourself of your decision to quit.  Do not replace cigarette smoking with electronic cigarettes (commonly called e-cigarettes). The safety of e-cigarettes is unknown, and some may contain harmful chemicals.  If you relapse, do not give up! Plan ahead and think about what you will do the next time you get the urge to smoke. HOW WILL I FEEL WHEN I QUIT SMOKING? You  may have symptoms of withdrawal because your body is used to nicotine (the addictive substance in cigarettes). You may crave cigarettes, be irritable, feel very hungry, cough often, get headaches, or have difficulty concentrating. The withdrawal symptoms are only temporary. They are strongest when you first quit but will go away within 10-14 days. When withdrawal symptoms occur, stay in control. Think about your reasons for quitting. Remind yourself that these are signs that your body is healing and getting used to being without cigarettes. Remember that withdrawal symptoms are easier to treat than the major diseases that smoking can cause.  Even after the withdrawal is over, expect periodic urges to smoke. However, these cravings are generally short lived and will go away whether you smoke or not. Do not smoke! WHAT RESOURCES ARE AVAILABLE TO HELP ME QUIT SMOKING? Your health care provider can direct you to community resources or hospitals for support, which may include:  Group support.  Education.  Hypnosis.  Therapy.   This information is not intended to replace advice given to you by your health care provider. Make sure you discuss any questions you have with your health care provider.   Document Released: 12/11/2003 Document Revised: 04/04/2014 Document Reviewed: 08/30/2012 Elsevier Interactive Patient Education 2016 [employer]  

## 2016-01-06 NOTE — Telephone Encounter (Signed)
Avs report and appointment schedule given to patient per 01/06/16 los.

## 2016-01-06 NOTE — Patient Instructions (Signed)
[facility] Discharge Instructions for Patients Receiving Chemotherapy  Today you received the following chemotherapy agents Taxol and Carboplatin. To help prevent nausea and vomiting after your treatment, we encourage you to take your nausea medication as directed.  If you develop nausea and vomiting that is not controlled by your nausea medication, call the clinic.   BELOW ARE SYMPTOMS THAT SHOULD BE REPORTED IMMEDIATELY:  *FEVER GREATER THAN 100.5 F  *CHILLS WITH OR WITHOUT FEVER  NAUSEA AND VOMITING THAT IS NOT CONTROLLED WITH YOUR NAUSEA MEDICATION  *UNUSUAL SHORTNESS OF BREATH  *UNUSUAL BRUISING OR BLEEDING  TENDERNESS IN MOUTH AND THROAT WITH OR WITHOUT PRESENCE OF ULCERS  *URINARY PROBLEMS  *BOWEL PROBLEMS  UNUSUAL RASH Items with * indicate a potential emergency and should be followed up as soon as possible.  Feel free to call the clinic you have any questions or concerns. The clinic phone number is [phone].  Please show the CHEMO ALERT CARD at check-in to the Emergency Department and triage nurse.    

## 2016-01-06 NOTE — Progress Notes (Signed)
[place] [phone]   Fax:(336) [phone]  OFFICE PROGRESS NOTE  Grant Fontana, MD (Inactive) No address on file  DIAGNOSIS: Stage IV (T3, N2, M1b) non-small cell lung cancer, squamous cell carcinoma with PDL 1 expression 25%, presented with massive right upper lobe lung mass in addition to mediastinal lymphadenopathy and subcutaneous metastatic disease. Interestingly the patient is not symptomatic except from the subcutaneous nodule diagnosed in August 2017.  PRIOR THERAPY: Palliative radiotherapy to the right lung and abdominal wall mass under the care of Dr. Lisbeth Renshaw completed on 11/20/2015.  CURRENT THERAPY: Systemic chemotherapy with carboplatin for AUC of 5 and paclitaxel 175 MG/M2 every 3 weeks with Neulasta support. First dose 12/17/2015. Status post 2 cycles.  INTERVAL HISTORY: Keith Murray 55 y.o. male returns to the clinic today for follow-up visit. The patient is doing very well today with no specific complaints. He tolerated the first cycle of his chemotherapy with carboplatin and paclitaxel fairly well. He noticed improvement in the abdominal wall mass as well as his breathing. He denied having any significant fever or chills. He has no nausea or vomiting. He denied having any significant chest pain, shortness breath, cough or hemoptysis. He has no significant weight loss or night sweats. He is here today to start cycle #2.  MEDICAL HISTORY: Past Medical History:  Diagnosis Date  . Anxiety   . Cancer ([place])    lung  . Polysubstance abuse    cocaine, etoh, marijuana  . Psychosis   . Schizoaffective disorder ([place])     ALLERGIES:  has No Known Allergies.  MEDICATIONS:  Current Outpatient Prescriptions  Medication Sig Dispense Refill  . albuterol (PROVENTIL) (2.5 MG/3ML) 0.083% nebulizer solution Take 3 mLs (2.5 mg total) by nebulization every 6 (six) hours as needed for wheezing or shortness of breath. 75 mL 12  . benztropine (COGENTIN)  0.5 MG tablet Take 1 tablet (0.5 mg total) by mouth 2 (two) times daily. For prevention of drug induced tremors 60 tablet 1  . CVS VITAMIN B12 1000 MCG tablet Take 1,000 mcg by mouth daily.  0  . FLUoxetine (PROZAC) 20 MG capsule Take 1 capsule (20 mg total) by mouth daily. 60 capsule 1  . folic acid (FOLVITE) 1 MG tablet Take 1 mg by mouth daily.  3  . gabapentin (NEURONTIN) 300 MG capsule Take 1 capsule (300 mg total) by mouth 3 (three) times daily. 90 capsule 1  . haloperidol (HALDOL) 5 MG tablet Take 1 tablet (5 mg total) by mouth 2 (two) times daily. For mood control 60 tablet 1  . Multiple Vitamin (MULTIVITAMIN WITH MINERALS) TABS tablet Take 1 tablet by mouth daily. May purchase over the counter: As a Vitamin supplement    . naproxen (EC-NAPROSYN) 500 MG EC tablet Take 1 tablet (500 mg total) by mouth 2 (two) times daily with a meal. 60 tablet 2  . phosphorus (K PHOS NEUTRAL) 155-852-130 MG tablet Take 1 tablet (250 mg total) by mouth 2 (two) times daily. 60 tablet 0  . traZODone (DESYREL) 100 MG tablet Take 1 tablet (100 mg total) by mouth at bedtime. For insomnia 30 tablet 1  . guaiFENesin (MUCINEX) 600 MG 12 hr tablet Take 1 tablet (600 mg total) by mouth 2 (two) times daily. (Patient not taking: Reported on 01/06/2016) 60 tablet 2  . HYDROcodone-acetaminophen (NORCO/VICODIN) 5-325 MG tablet Take 1-2 tablets by mouth every 4 (four) hours as needed for moderate pain. (Patient not taking: Reported on 01/06/2016)  30 tablet 0  . hydrOXYzine (ATARAX/VISTARIL) 25 MG tablet Take 1 tablet (25 mg) every 6 hours as needed: Foe anxiety/sleep (Patient not taking: Reported on 01/06/2016) 60 tablet 0  . nicotine (NICODERM CQ - DOSED IN MG/24 HOURS) 21 mg/24hr patch Place 1 patch (21 mg total) onto the skin daily. (Patient not taking: Reported on 01/06/2016) 28 patch 0  . prochlorperazine (COMPAZINE) 10 MG tablet Take 1 tablet (10 mg total) by mouth every 6 (six) hours as needed for nausea or vomiting.  (Patient not taking: Reported on 01/06/2016) 30 tablet 0   No current facility-administered medications for this visit.     SURGICAL HISTORY:  Past Surgical History:  Procedure Laterality Date  . HERNIA REPAIR      REVIEW OF SYSTEMS:  A comprehensive review of systems was negative.   PHYSICAL EXAMINATION: General appearance: alert, cooperative, fatigued and no distress Head: Normocephalic, without obvious abnormality, atraumatic Neck: no adenopathy, no JVD, supple, symmetrical, trachea midline and thyroid not enlarged, symmetric, no tenderness/mass/nodules Lymph nodes: Cervical, supraclavicular, and axillary nodes normal. Resp: diminished breath sounds RLL and dullness to percussion RLL Back: symmetric, no curvature. ROM normal. No CVA tenderness. Cardio: regular rate and rhythm, S1, S2 normal, no murmur, click, rub or gallop GI: soft, non-tender; bowel sounds normal; no masses,  no organomegaly Extremities: extremities normal, atraumatic, no cyanosis or edema Neurologic: Alert and oriented X 3, normal strength and tone. Normal symmetric reflexes. Normal coordination and gait  ECOG PERFORMANCE STATUS: 1 - Symptomatic but completely ambulatory  Blood pressure (!) 121/91, pulse 87, temperature 97.6 F (36.4 C), temperature source Oral, resp. rate 18, height '6\' 2"'$  (1.88 m), weight 164 lb 12.8 oz (74.8 kg), SpO2 100 %.  LABORATORY DATA: Lab Results  Component Value Date   WBC 5.5 01/06/2016   HGB 9.9 (L) 01/06/2016   HCT 31.5 (L) 01/06/2016   MCV 87.7 01/06/2016   PLT 194 01/06/2016      Chemistry      Component Value Date/Time   NA 141 01/06/2016 0918   K 3.9 01/06/2016 0918   CL 110 12/27/2015 1205   CO2 19 (L) 01/06/2016 0918   BUN 13.8 01/06/2016 0918   CREATININE 0.8 01/06/2016 0918      Component Value Date/Time   CALCIUM 9.1 01/06/2016 0918   ALKPHOS 171 (H) 01/06/2016 0918   AST 8 01/06/2016 0918   ALT <6 01/06/2016 0918   BILITOT 0.27 01/06/2016 0918        RADIOGRAPHIC STUDIES: No results found.  ASSESSMENT AND PLAN: This is a very pleasant 55 years old African-American male recently diagnosed with a stage IV non-small cell lung cancer, squamous cell carcinoma with PDL 1 expression 25%. Presented with large right upper lobe lung mass, mediastinal lymph nodes in addition to right pleural effusion and abdominal and subcutaneous masses diagnosed in August 2017. The patient underwent palliative radiotherapy to the right upper lobe lung mass in addition to the right lower quadrant abdominal subcutaneous mass. He is currently undergoing systemic chemotherapy with carboplatin and paclitaxel is status post 1 cycle. He tolerated the first cycle of his treatment fairly well. I recommended for the patient to proceed with cycle #2 today. He would come back for follow-up visit in 3 weeks for evaluation before starting cycle #3. The patient was advised to call immediately if he has any concerning symptoms in the interval. The patient voices understanding of current disease status and treatment options and is in agreement with the current care  plan.  All questions were answered. The patient knows to call the clinic with any problems, questions or concerns. We can certainly see the patient much sooner if necessary.  Disclaimer: This note was dictated with voice recognition software. Similar sounding words can inadvertently be transcribed and may not be corrected upon review.

## 2016-01-07 ENCOUNTER — Ambulatory Visit: Payer: Self-pay

## 2016-01-13 ENCOUNTER — Other Ambulatory Visit (HOSPITAL_BASED_OUTPATIENT_CLINIC_OR_DEPARTMENT_OTHER): Payer: Medicare Other

## 2016-01-13 DIAGNOSIS — C3411 Malignant neoplasm of upper lobe, right bronchus or lung: Secondary | ICD-10-CM

## 2016-01-13 LAB — CBC WITH DIFFERENTIAL/PLATELET
BASO%: 0.1 % (ref 0.0–2.0)
Basophils Absolute: 0 10*3/uL (ref 0.0–0.1)
EOS%: 1.4 % (ref 0.0–7.0)
Eosinophils Absolute: 0.2 10*3/uL (ref 0.0–0.5)
HCT: 30.8 % — ABNORMAL LOW (ref 38.4–49.9)
HGB: 9.8 g/dL — ABNORMAL LOW (ref 13.0–17.1)
LYMPH%: 5.7 % — ABNORMAL LOW (ref 14.0–49.0)
MCH: 28.3 pg (ref 27.2–33.4)
MCHC: 31.8 g/dL — ABNORMAL LOW (ref 32.0–36.0)
MCV: 89 fL (ref 79.3–98.0)
MONO#: 1.1 10*3/uL — ABNORMAL HIGH (ref 0.1–0.9)
MONO%: 7.2 % (ref 0.0–14.0)
NEUT#: 13.3 10*3/uL — ABNORMAL HIGH (ref 1.5–6.5)
NEUT%: 85.6 % — ABNORMAL HIGH (ref 39.0–75.0)
Platelets: 137 10*3/uL — ABNORMAL LOW (ref 140–400)
RBC: 3.46 10*6/uL — ABNORMAL LOW (ref 4.20–5.82)
RDW: 17.3 % — ABNORMAL HIGH (ref 11.0–14.6)
WBC: 15.5 10*3/uL — ABNORMAL HIGH (ref 4.0–10.3)
lymph#: 0.9 10*3/uL (ref 0.9–3.3)

## 2016-01-13 LAB — COMPREHENSIVE METABOLIC PANEL
ALT: 11 U/L (ref 0–55)
AST: 15 U/L (ref 5–34)
Albumin: 3.3 g/dL — ABNORMAL LOW (ref 3.5–5.0)
Alkaline Phosphatase: 225 U/L — ABNORMAL HIGH (ref 40–150)
Anion Gap: 8 mEq/L (ref 3–11)
BUN: 12.2 mg/dL (ref 7.0–26.0)
CO2: 22 mEq/L (ref 22–29)
Calcium: 9.2 mg/dL (ref 8.4–10.4)
Chloride: 111 mEq/L — ABNORMAL HIGH (ref 98–109)
Creatinine: 0.9 mg/dL (ref 0.7–1.3)
EGFR: >90 mL/min/{1.73_m2} (ref >90)
Glucose: 103 mg/dl (ref 70–140)
Potassium: 4.5 mEq/L (ref 3.5–5.1)
Sodium: 141 mEq/L (ref 136–145)
Total Bilirubin: 0.23 mg/dL (ref 0.20–1.20)
Total Protein: 7 g/dL (ref 6.4–8.3)

## 2016-01-20 ENCOUNTER — Other Ambulatory Visit (HOSPITAL_BASED_OUTPATIENT_CLINIC_OR_DEPARTMENT_OTHER): Payer: Medicare Other

## 2016-01-20 DIAGNOSIS — C3411 Malignant neoplasm of upper lobe, right bronchus or lung: Secondary | ICD-10-CM | POA: Diagnosis not present

## 2016-01-20 LAB — CBC WITH DIFFERENTIAL/PLATELET
BASO%: 0.2 % (ref 0.0–2.0)
Basophils Absolute: 0 10*3/uL (ref 0.0–0.1)
EOS%: 1.2 % (ref 0.0–7.0)
Eosinophils Absolute: 0.1 10*3/uL (ref 0.0–0.5)
HCT: 32.1 % — ABNORMAL LOW (ref 38.4–49.9)
HGB: 10.3 g/dL — ABNORMAL LOW (ref 13.0–17.1)
LYMPH%: 12.5 % — ABNORMAL LOW (ref 14.0–49.0)
MCH: 28.8 pg (ref 27.2–33.4)
MCHC: 32.1 g/dL (ref 32.0–36.0)
MCV: 89.7 fL (ref 79.3–98.0)
MONO#: 0.5 10*3/uL (ref 0.1–0.9)
MONO%: 9.1 % (ref 0.0–14.0)
NEUT#: 4.4 10*3/uL (ref 1.5–6.5)
NEUT%: 77 % — ABNORMAL HIGH (ref 39.0–75.0)
Platelets: 222 10*3/uL (ref 140–400)
RBC: 3.58 10*6/uL — ABNORMAL LOW (ref 4.20–5.82)
RDW: 18.9 % — ABNORMAL HIGH (ref 11.0–14.6)
WBC: 5.7 10*3/uL (ref 4.0–10.3)
lymph#: 0.7 10*3/uL — ABNORMAL LOW (ref 0.9–3.3)

## 2016-01-20 LAB — COMPREHENSIVE METABOLIC PANEL
ALT: 11 U/L (ref 0–55)
AST: 9 U/L (ref 5–34)
Albumin: 3.3 g/dL — ABNORMAL LOW (ref 3.5–5.0)
Alkaline Phosphatase: 199 U/L — ABNORMAL HIGH (ref 40–150)
Anion Gap: 8 mEq/L (ref 3–11)
BUN: 11.4 mg/dL (ref 7.0–26.0)
CO2: 21 mEq/L — ABNORMAL LOW (ref 22–29)
Calcium: 8.7 mg/dL (ref 8.4–10.4)
Chloride: 109 mEq/L (ref 98–109)
Creatinine: 0.8 mg/dL (ref 0.7–1.3)
EGFR: >90 mL/min/{1.73_m2} (ref >90)
Glucose: 99 mg/dl (ref 70–140)
Potassium: 4.4 mEq/L (ref 3.5–5.1)
Sodium: 139 mEq/L (ref 136–145)
Total Bilirubin: <0.22 mg/dL (ref 0.20–1.20)
Total Protein: 7.2 g/dL (ref 6.4–8.3)

## 2016-01-26 ENCOUNTER — Other Ambulatory Visit: Payer: Self-pay | Admitting: Internal Medicine

## 2016-01-27 ENCOUNTER — Ambulatory Visit (HOSPITAL_BASED_OUTPATIENT_CLINIC_OR_DEPARTMENT_OTHER): Payer: Medicare Other

## 2016-01-27 ENCOUNTER — Encounter: Payer: Self-pay | Admitting: Nurse Practitioner

## 2016-01-27 ENCOUNTER — Ambulatory Visit (HOSPITAL_BASED_OUTPATIENT_CLINIC_OR_DEPARTMENT_OTHER): Payer: Medicare Other | Admitting: Nurse Practitioner

## 2016-01-27 ENCOUNTER — Other Ambulatory Visit (HOSPITAL_BASED_OUTPATIENT_CLINIC_OR_DEPARTMENT_OTHER): Payer: Medicare Other

## 2016-01-27 VITALS — BP 122/74 | HR 79 | Temp 98.0°F | Resp 18 | Ht 74.0 in | Wt 158.6 lb

## 2016-01-27 DIAGNOSIS — C3411 Malignant neoplasm of upper lobe, right bronchus or lung: Secondary | ICD-10-CM

## 2016-01-27 DIAGNOSIS — C7989 Secondary malignant neoplasm of other specified sites: Secondary | ICD-10-CM | POA: Diagnosis not present

## 2016-01-27 DIAGNOSIS — D630 Anemia in neoplastic disease: Secondary | ICD-10-CM

## 2016-01-27 DIAGNOSIS — Z5111 Encounter for antineoplastic chemotherapy: Secondary | ICD-10-CM | POA: Diagnosis not present

## 2016-01-27 DIAGNOSIS — Z720 Tobacco use: Secondary | ICD-10-CM

## 2016-01-27 DIAGNOSIS — F1410 Cocaine abuse, uncomplicated: Secondary | ICD-10-CM | POA: Diagnosis not present

## 2016-01-27 DIAGNOSIS — Z716 Tobacco abuse counseling: Secondary | ICD-10-CM

## 2016-01-27 LAB — CBC WITH DIFFERENTIAL/PLATELET
BASO%: 0.4 % (ref 0.0–2.0)
Basophils Absolute: 0 10*3/uL (ref 0.0–0.1)
EOS%: 0.9 % (ref 0.0–7.0)
Eosinophils Absolute: 0 10*3/uL (ref 0.0–0.5)
HCT: 33.4 % — ABNORMAL LOW (ref 38.4–49.9)
HGB: 10.8 g/dL — ABNORMAL LOW (ref 13.0–17.1)
LYMPH%: 19.1 % (ref 14.0–49.0)
MCH: 29.3 pg (ref 27.2–33.4)
MCHC: 32.3 g/dL (ref 32.0–36.0)
MCV: 90.8 fL (ref 79.3–98.0)
MONO#: 0.6 10*3/uL (ref 0.1–0.9)
MONO%: 12.8 % (ref 0.0–14.0)
NEUT#: 3.1 10*3/uL (ref 1.5–6.5)
NEUT%: 66.8 % (ref 39.0–75.0)
Platelets: 197 10*3/uL (ref 140–400)
RBC: 3.68 10*6/uL — ABNORMAL LOW (ref 4.20–5.82)
RDW: 18.2 % — ABNORMAL HIGH (ref 11.0–14.6)
WBC: 4.6 10*3/uL (ref 4.0–10.3)
lymph#: 0.9 10*3/uL (ref 0.9–3.3)

## 2016-01-27 LAB — COMPREHENSIVE METABOLIC PANEL
ALT: 8 U/L (ref 0–55)
AST: 10 U/L (ref 5–34)
Albumin: 3.4 g/dL — ABNORMAL LOW (ref 3.5–5.0)
Alkaline Phosphatase: 184 U/L — ABNORMAL HIGH (ref 40–150)
Anion Gap: 9 mEq/L (ref 3–11)
BUN: 9 mg/dL (ref 7.0–26.0)
CO2: 22 mEq/L (ref 22–29)
Calcium: 9 mg/dL (ref 8.4–10.4)
Chloride: 108 mEq/L (ref 98–109)
Creatinine: 0.8 mg/dL (ref 0.7–1.3)
EGFR: >90 mL/min/{1.73_m2} (ref >90)
Glucose: 98 mg/dl (ref 70–140)
Potassium: 3.7 mEq/L (ref 3.5–5.1)
Sodium: 139 mEq/L (ref 136–145)
Total Bilirubin: 0.39 mg/dL (ref 0.20–1.20)
Total Protein: 7.5 g/dL (ref 6.4–8.3)

## 2016-01-27 MED ORDER — SODIUM CHLORIDE 0.9 % IV SOLN
Freq: Once | INTRAVENOUS | Status: AC
  Administered 2016-01-27: 11:00:00 via INTRAVENOUS

## 2016-01-27 MED ORDER — PEGFILGRASTIM 6 MG/0.6ML ~~LOC~~ PSKT
6.0000 mg | PREFILLED_SYRINGE | Freq: Once | SUBCUTANEOUS | Status: AC
  Administered 2016-01-27: 6 mg via SUBCUTANEOUS
  Filled 2016-01-27: qty 0.6

## 2016-01-27 MED ORDER — PALONOSETRON HCL INJECTION 0.25 MG/5ML
INTRAVENOUS | Status: AC
  Filled 2016-01-27: qty 5

## 2016-01-27 MED ORDER — FAMOTIDINE IN NACL 20-0.9 MG/50ML-% IV SOLN
INTRAVENOUS | Status: AC
  Filled 2016-01-27: qty 50

## 2016-01-27 MED ORDER — PALONOSETRON HCL INJECTION 0.25 MG/5ML
0.2500 mg | Freq: Once | INTRAVENOUS | Status: AC
  Administered 2016-01-27: 0.25 mg via INTRAVENOUS

## 2016-01-27 MED ORDER — FAMOTIDINE IN NACL 20-0.9 MG/50ML-% IV SOLN
20.0000 mg | Freq: Once | INTRAVENOUS | Status: AC
  Administered 2016-01-27: 20 mg via INTRAVENOUS

## 2016-01-27 MED ORDER — SODIUM CHLORIDE 0.9 % IV SOLN
20.0000 mg | Freq: Once | INTRAVENOUS | Status: AC
  Administered 2016-01-27: 20 mg via INTRAVENOUS
  Filled 2016-01-27: qty 2

## 2016-01-27 MED ORDER — DEXTROSE 5 % IV SOLN
175.0000 mg/m2 | Freq: Once | INTRAVENOUS | Status: AC
  Administered 2016-01-27: 354 mg via INTRAVENOUS
  Filled 2016-01-27: qty 59

## 2016-01-27 MED ORDER — DIPHENHYDRAMINE HCL 50 MG/ML IJ SOLN
50.0000 mg | Freq: Once | INTRAMUSCULAR | Status: AC
  Administered 2016-01-27: 50 mg via INTRAVENOUS

## 2016-01-27 MED ORDER — SODIUM CHLORIDE 0.9 % IV SOLN
709.0000 mg | Freq: Once | INTRAVENOUS | Status: AC
  Administered 2016-01-27: 710 mg via INTRAVENOUS
  Filled 2016-01-27: qty 71

## 2016-01-27 MED ORDER — DIPHENHYDRAMINE HCL 50 MG/ML IJ SOLN
INTRAMUSCULAR | Status: AC
  Filled 2016-01-27: qty 1

## 2016-01-27 NOTE — Progress Notes (Signed)
[place] Telephone:([phone]   [phone]) [phone]  OFFICE PROGRESS NOTE  Grant Fontana, MD (Inactive) No address on file  DIAGNOSIS: Stage IV (T3, N2, M1b) non-small cell lung cancer, squamous cell carcinoma with PDL 1 expression 25%, presented with massive right upper lobe lung mass in addition to mediastinal lymphadenopathy and subcutaneous metastatic disease. Interestingly the patient is not symptomatic except from the subcutaneous nodule diagnosed in August 2017.  PRIOR THERAPY: Palliative radiotherapy to the right lung and abdominal wall mass under the care of Dr. Lisbeth Renshaw completed on 11/20/2015.  CURRENT THERAPY: Systemic chemotherapy with carboplatin for AUC of 5 and paclitaxel 175 MG/M2 every 3 weeks with Neulasta support. First dose 12/17/2015. Status post 2 cycles.  INTERVAL HISTORY: Keith Murray 55 y.o. male returns to the clinic today for follow-up visit. He is accompanied by his niece Lyndee Leo. The patient is doing very well today with no specific complaints. He is tolerating his chemotherapy with carboplatin and paclitaxel fairly well. He noticed improvement in the abdominal wall mass as well as his breathing. He denied having any significant fever or chills. He has no nausea or vomiting. He denied having any significant chest pain, shortness breath, cough or hemoptysis. He has no significant weight loss or night sweats. He is here today to start cycle #3. He continues to smoke, but states that he has cut back significantly and has not had any cigarettes yet today. His niece states that he is smoking a pack a day. He used to live with her until he had an incident where it was discovered he had been using cocaine again.He is now living at an Extended Stay hotel.  Lengthy discussion today about benefit of chemotherapy while continuing to smoke. Patient and his niece are at odds somewhat about goals of treatment and motivation to continue. Patient voiced clearly  he does want to get chemo today. Niece stated if he won't quit smoking she won't bring him for treatment any more, and patient stated in that case he will quit treatment. Support offered. Patient has been using patch and gum intermittently.   MEDICAL HISTORY: Past Medical History:  Diagnosis Date  . Anxiety   . Cancer ([place])    lung  . Polysubstance abuse    cocaine, etoh, marijuana  . Psychosis   . Schizoaffective disorder ([place])     ALLERGIES:  has No Known Allergies.  MEDICATIONS:  Current Outpatient Prescriptions  Medication Sig Dispense Refill  . albuterol (PROVENTIL) (2.5 MG/3ML) 0.083% nebulizer solution Take 3 mLs (2.5 mg total) by nebulization every 6 (six) hours as needed for wheezing or shortness of breath. 75 mL 12  . benztropine (COGENTIN) 0.5 MG tablet Take 1 tablet (0.5 mg total) by mouth 2 (two) times daily. For prevention of drug induced tremors 60 tablet 1  . CVS VITAMIN B12 1000 MCG tablet Take 1,000 mcg by mouth daily.  0  . FLUoxetine (PROZAC) 20 MG capsule Take 1 capsule (20 mg total) by mouth daily. 60 capsule 1  . folic acid (FOLVITE) 1 MG tablet Take 1 mg by mouth daily.  3  . gabapentin (NEURONTIN) 300 MG capsule Take 1 capsule (300 mg total) by mouth 3 (three) times daily. 90 capsule 1  . haloperidol (HALDOL) 5 MG tablet Take 1 tablet (5 mg total) by mouth 2 (two) times daily. For mood control 60 tablet 1  . HYDROcodone-acetaminophen (NORCO/VICODIN) 5-325 MG tablet Take 1-2 tablets by mouth every 4 (four) hours as  needed for moderate pain. (Patient not taking: Reported on 01/06/2016) 30 tablet 0  . Multiple Vitamin (MULTIVITAMIN WITH MINERALS) TABS tablet Take 1 tablet by mouth daily. May purchase over the counter: As a Vitamin supplement    . naproxen (EC-NAPROSYN) 500 MG EC tablet Take 1 tablet (500 mg total) by mouth 2 (two) times daily with a meal. 60 tablet 2  . nicotine (NICODERM CQ - DOSED IN MG/24 HOURS) 21 mg/24hr patch Place 1 patch (21 mg total) onto  the skin daily. (Patient not taking: Reported on 01/06/2016) 28 patch 0  . phosphorus (K PHOS NEUTRAL) 155-852-130 MG tablet Take 1 tablet (250 mg total) by mouth 2 (two) times daily. 60 tablet 0  . prochlorperazine (COMPAZINE) 10 MG tablet Take 1 tablet (10 mg total) by mouth every 6 (six) hours as needed for nausea or vomiting. (Patient not taking: Reported on 01/06/2016) 30 tablet 0  . traZODone (DESYREL) 100 MG tablet Take 1 tablet (100 mg total) by mouth at bedtime. For insomnia 30 tablet 1   No current facility-administered medications for this visit.     SURGICAL HISTORY:  Past Surgical History:  Procedure Laterality Date  . HERNIA REPAIR      REVIEW OF SYSTEMS: reports intermittent numbness bilaterally in his feet only, that is not present currently. Comprehensive review of systems otherwise negative.   PHYSICAL EXAMINATION: General appearance: alert, cooperative, fatigued and no distress Head: Normocephalic, without obvious abnormality, atraumatic Neck: no adenopathy, no JVD, supple, symmetrical, trachea midline and thyroid not enlarged, symmetric, no tenderness/mass/nodules Lymph nodes: Cervical, supraclavicular, and axillary nodes normal. Resp: diminished breath sounds RLL and dullness to percussion RLL Back: symmetric, no curvature. ROM normal. No CVA tenderness. Cardio: regular rate and rhythm, S1, S2 normal, no murmur, click, rub or gallop GI: soft, non-tender; bowel sounds normal; no masses,  no organomegaly Extremities: extremities normal, atraumatic, no cyanosis or edema Neurologic: Alert and oriented X 3, normal strength and tone. Normal symmetric reflexes. Normal coordination and gait  ECOG PERFORMANCE STATUS: 1 - Symptomatic but completely ambulatory  Blood pressure 122/74, pulse 79, temperature 98 F (36.7 C), temperature source Oral, resp. rate 18, height '6\' 2"'$  (1.88 m), weight 158 lb 9.6 oz (71.9 kg), SpO2 100 %.  LABORATORY DATA: Lab Results  Component Value  Date   WBC 4.6 01/27/2016   HGB 10.8 (L) 01/27/2016   HCT 33.4 (L) 01/27/2016   MCV 90.8 01/27/2016   PLT 197 01/27/2016      Chemistry      Component Value Date/Time   NA 139 01/27/2016 0930   K 3.7 01/27/2016 0930   CL 110 12/27/2015 1205   CO2 22 01/27/2016 0930   BUN 9.0 01/27/2016 0930   CREATININE 0.8 01/27/2016 0930      Component Value Date/Time   CALCIUM 9.0 01/27/2016 0930   ALKPHOS 184 (H) 01/27/2016 0930   AST 10 01/27/2016 0930   ALT 8 01/27/2016 0930   BILITOT 0.39 01/27/2016 0930       RADIOGRAPHIC STUDIES: No results found.  ASSESSMENT AND PLAN: This is a very pleasant 55 years old African-American male recently diagnosed with a stage IV non-small cell lung cancer, squamous cell carcinoma with PDL 1 expression 25%. Presented with large right upper lobe lung mass, mediastinal lymph nodes in addition to right pleural effusion and abdominal and subcutaneous masses diagnosed in August 2017.  The patient underwent palliative radiotherapy to the right upper lobe lung mass in addition to the right lower quadrant  abdominal subcutaneous mass. He is currently undergoing systemic chemotherapy with carboplatin and paclitaxel is status post 2 cycles. He tolerated the first 2 cycles of his treatment fairly well. I recommended for the patient to proceed with cycle #3 today.  He would come back for follow-up visit in 3 weeks for evaluation before starting cycle #4.  Strongly counseled patient to continue efforts to stop smoking and avoid illegal drugs. Emotional support offered to both patient and niece.   The patient was advised to call immediately if he has any concerning symptoms in the interval. The patient voices understanding of current disease status and treatment options and is in agreement with the current care plan.  All questions were answered. The patient knows to call the clinic with any problems, questions or concerns. We can certainly see the patient much  sooner if necessary.  Burns Spain, NP-C, AOCNP

## 2016-01-27 NOTE — Patient Instructions (Signed)
[facility] Discharge Instructions for Patients Receiving Chemotherapy  Today you received the following chemotherapy agents Taxol and Carboplatin. To help prevent nausea and vomiting after your treatment, we encourage you to take your nausea medication as directed.  If you develop nausea and vomiting that is not controlled by your nausea medication, call the clinic.   BELOW ARE SYMPTOMS THAT SHOULD BE REPORTED IMMEDIATELY:  *FEVER GREATER THAN 100.5 F  *CHILLS WITH OR WITHOUT FEVER  NAUSEA AND VOMITING THAT IS NOT CONTROLLED WITH YOUR NAUSEA MEDICATION  *UNUSUAL SHORTNESS OF BREATH  *UNUSUAL BRUISING OR BLEEDING  TENDERNESS IN MOUTH AND THROAT WITH OR WITHOUT PRESENCE OF ULCERS  *URINARY PROBLEMS  *BOWEL PROBLEMS  UNUSUAL RASH Items with * indicate a potential emergency and should be followed up as soon as possible.  Feel free to call the clinic you have any questions or concerns. The clinic phone number is [phone].  Please show the CHEMO ALERT CARD at check-in to the Emergency Department and triage nurse.    

## 2016-02-03 ENCOUNTER — Other Ambulatory Visit: Payer: Self-pay

## 2016-02-06 DIAGNOSIS — J189 Pneumonia, unspecified organism: Secondary | ICD-10-CM | POA: Diagnosis not present

## 2016-02-10 ENCOUNTER — Other Ambulatory Visit (HOSPITAL_BASED_OUTPATIENT_CLINIC_OR_DEPARTMENT_OTHER): Payer: Medicare Other

## 2016-02-10 DIAGNOSIS — C3411 Malignant neoplasm of upper lobe, right bronchus or lung: Secondary | ICD-10-CM | POA: Diagnosis not present

## 2016-02-10 LAB — COMPREHENSIVE METABOLIC PANEL
ALT: 11 U/L (ref 0–55)
AST: 10 U/L (ref 5–34)
Albumin: 3.4 g/dL — ABNORMAL LOW (ref 3.5–5.0)
Alkaline Phosphatase: 184 U/L — ABNORMAL HIGH (ref 40–150)
Anion Gap: 8 mEq/L (ref 3–11)
BUN: 9.6 mg/dL (ref 7.0–26.0)
CO2: 22 mEq/L (ref 22–29)
Calcium: 9.3 mg/dL (ref 8.4–10.4)
Chloride: 110 mEq/L — ABNORMAL HIGH (ref 98–109)
Creatinine: 0.9 mg/dL (ref 0.7–1.3)
EGFR: >90 mL/min/{1.73_m2} (ref >90)
Glucose: 107 mg/dl (ref 70–140)
Potassium: 4.2 mEq/L (ref 3.5–5.1)
Sodium: 140 mEq/L (ref 136–145)
Total Bilirubin: 0.26 mg/dL (ref 0.20–1.20)
Total Protein: 7.6 g/dL (ref 6.4–8.3)

## 2016-02-10 LAB — CBC WITH DIFFERENTIAL/PLATELET
BASO%: 0.2 % (ref 0.0–2.0)
Basophils Absolute: 0 10*3/uL (ref 0.0–0.1)
EOS%: 0.9 % (ref 0.0–7.0)
Eosinophils Absolute: 0.1 10*3/uL (ref 0.0–0.5)
HCT: 34.7 % — ABNORMAL LOW (ref 38.4–49.9)
HGB: 11.3 g/dL — ABNORMAL LOW (ref 13.0–17.1)
LYMPH%: 17.3 % (ref 14.0–49.0)
MCH: 30 pg (ref 27.2–33.4)
MCHC: 32.6 g/dL (ref 32.0–36.0)
MCV: 92 fL (ref 79.3–98.0)
MONO#: 0.6 10*3/uL (ref 0.1–0.9)
MONO%: 11.3 % (ref 0.0–14.0)
NEUT#: 3.9 10*3/uL (ref 1.5–6.5)
NEUT%: 70.3 % (ref 39.0–75.0)
Platelets: 225 10*3/uL (ref 140–400)
RBC: 3.77 10*6/uL — ABNORMAL LOW (ref 4.20–5.82)
RDW: 19.1 % — ABNORMAL HIGH (ref 11.0–14.6)
WBC: 5.6 10*3/uL (ref 4.0–10.3)
lymph#: 1 10*3/uL (ref 0.9–3.3)

## 2016-02-17 ENCOUNTER — Encounter: Payer: Self-pay | Admitting: Internal Medicine

## 2016-02-17 ENCOUNTER — Telehealth: Payer: Self-pay | Admitting: Internal Medicine

## 2016-02-17 ENCOUNTER — Ambulatory Visit (HOSPITAL_BASED_OUTPATIENT_CLINIC_OR_DEPARTMENT_OTHER): Payer: Medicare Other | Admitting: Internal Medicine

## 2016-02-17 ENCOUNTER — Other Ambulatory Visit (HOSPITAL_BASED_OUTPATIENT_CLINIC_OR_DEPARTMENT_OTHER): Payer: Medicare Other

## 2016-02-17 ENCOUNTER — Ambulatory Visit (HOSPITAL_BASED_OUTPATIENT_CLINIC_OR_DEPARTMENT_OTHER): Payer: Medicare Other

## 2016-02-17 VITALS — BP 113/73 | HR 85 | Temp 98.3°F | Resp 17 | Ht 74.0 in | Wt 166.2 lb

## 2016-02-17 DIAGNOSIS — C7989 Secondary malignant neoplasm of other specified sites: Secondary | ICD-10-CM

## 2016-02-17 DIAGNOSIS — C3411 Malignant neoplasm of upper lobe, right bronchus or lung: Secondary | ICD-10-CM

## 2016-02-17 DIAGNOSIS — Z5111 Encounter for antineoplastic chemotherapy: Secondary | ICD-10-CM | POA: Diagnosis not present

## 2016-02-17 DIAGNOSIS — D630 Anemia in neoplastic disease: Secondary | ICD-10-CM

## 2016-02-17 LAB — CBC WITH DIFFERENTIAL/PLATELET
BASO%: 0.9 % (ref 0.0–2.0)
Basophils Absolute: 0 10*3/uL (ref 0.0–0.1)
EOS%: 0.9 % (ref 0.0–7.0)
Eosinophils Absolute: 0 10*3/uL (ref 0.0–0.5)
HCT: 33.3 % — ABNORMAL LOW (ref 38.4–49.9)
HGB: 11 g/dL — ABNORMAL LOW (ref 13.0–17.1)
LYMPH%: 21.3 % (ref 14.0–49.0)
MCH: 30.4 pg (ref 27.2–33.4)
MCHC: 33 g/dL (ref 32.0–36.0)
MCV: 92 fL (ref 79.3–98.0)
MONO#: 0.7 10*3/uL (ref 0.1–0.9)
MONO%: 19.8 % — ABNORMAL HIGH (ref 0.0–14.0)
NEUT#: 1.9 10*3/uL (ref 1.5–6.5)
NEUT%: 57.1 % (ref 39.0–75.0)
Platelets: 179 10*3/uL (ref 140–400)
RBC: 3.62 10*6/uL — ABNORMAL LOW (ref 4.20–5.82)
RDW: 19.2 % — ABNORMAL HIGH (ref 11.0–14.6)
WBC: 3.3 10*3/uL — ABNORMAL LOW (ref 4.0–10.3)
lymph#: 0.7 10*3/uL — ABNORMAL LOW (ref 0.9–3.3)

## 2016-02-17 LAB — COMPREHENSIVE METABOLIC PANEL
ALT: 9 U/L (ref 0–55)
AST: 10 U/L (ref 5–34)
Albumin: 3.5 g/dL (ref 3.5–5.0)
Alkaline Phosphatase: 157 U/L — ABNORMAL HIGH (ref 40–150)
Anion Gap: 9 mEq/L (ref 3–11)
BUN: 15.4 mg/dL (ref 7.0–26.0)
CO2: 21 mEq/L — ABNORMAL LOW (ref 22–29)
Calcium: 9.3 mg/dL (ref 8.4–10.4)
Chloride: 110 mEq/L — ABNORMAL HIGH (ref 98–109)
Creatinine: 0.9 mg/dL (ref 0.7–1.3)
EGFR: >90 mL/min/{1.73_m2} (ref >90)
Glucose: 91 mg/dl (ref 70–140)
Potassium: 4.3 mEq/L (ref 3.5–5.1)
Sodium: 140 mEq/L (ref 136–145)
Total Bilirubin: 0.43 mg/dL (ref 0.20–1.20)
Total Protein: 7.4 g/dL (ref 6.4–8.3)

## 2016-02-17 MED ORDER — SODIUM CHLORIDE 0.9 % IV SOLN
Freq: Once | INTRAVENOUS | Status: AC
  Administered 2016-02-17: 12:00:00 via INTRAVENOUS

## 2016-02-17 MED ORDER — FAMOTIDINE IN NACL 20-0.9 MG/50ML-% IV SOLN
20.0000 mg | Freq: Once | INTRAVENOUS | Status: AC
Start: 2016-02-17 — End: 2016-02-17
  Administered 2016-02-17: 20 mg via INTRAVENOUS

## 2016-02-17 MED ORDER — PALONOSETRON HCL INJECTION 0.25 MG/5ML
INTRAVENOUS | Status: AC
  Filled 2016-02-17: qty 5

## 2016-02-17 MED ORDER — SODIUM CHLORIDE 0.9 % IV SOLN
20.0000 mg | Freq: Once | INTRAVENOUS | Status: AC
  Administered 2016-02-17: 20 mg via INTRAVENOUS
  Filled 2016-02-17: qty 2

## 2016-02-17 MED ORDER — PEGFILGRASTIM 6 MG/0.6ML ~~LOC~~ PSKT
6.0000 mg | PREFILLED_SYRINGE | Freq: Once | SUBCUTANEOUS | Status: AC
  Administered 2016-02-17: 6 mg via SUBCUTANEOUS
  Filled 2016-02-17: qty 0.6

## 2016-02-17 MED ORDER — SODIUM CHLORIDE 0.9 % IV SOLN
638.0000 mg | Freq: Once | INTRAVENOUS | Status: AC
  Administered 2016-02-17: 640 mg via INTRAVENOUS
  Filled 2016-02-17: qty 64

## 2016-02-17 MED ORDER — PACLITAXEL CHEMO INJECTION 300 MG/50ML
175.0000 mg/m2 | Freq: Once | INTRAVENOUS | Status: AC
  Administered 2016-02-17: 354 mg via INTRAVENOUS
  Filled 2016-02-17: qty 59

## 2016-02-17 MED ORDER — DIPHENHYDRAMINE HCL 50 MG/ML IJ SOLN
INTRAMUSCULAR | Status: AC
  Filled 2016-02-17: qty 1

## 2016-02-17 MED ORDER — PALONOSETRON HCL INJECTION 0.25 MG/5ML
0.2500 mg | Freq: Once | INTRAVENOUS | Status: AC
  Administered 2016-02-17: 0.25 mg via INTRAVENOUS

## 2016-02-17 MED ORDER — DIPHENHYDRAMINE HCL 50 MG/ML IJ SOLN
50.0000 mg | Freq: Once | INTRAMUSCULAR | Status: AC
  Administered 2016-02-17: 50 mg via INTRAVENOUS

## 2016-02-17 MED ORDER — FAMOTIDINE IN NACL 20-0.9 MG/50ML-% IV SOLN
INTRAVENOUS | Status: AC
  Filled 2016-02-17: qty 50

## 2016-02-17 NOTE — Telephone Encounter (Signed)
Message sent to chemo scheduler to be added per 02/17/16 los. Appointments scheduled per throughout 04/2016,  02/17/16 los. A copy of the AVS report and appointment schedule was given to the patient, per 02/17/16 los.

## 2016-02-17 NOTE — Progress Notes (Signed)
Keith [phone]   Fax:(336) [phone]  OFFICE PROGRESS NOTE  Grant Fontana, MD (Inactive) No address on file  DIAGNOSIS: Stage IV (T3, N2, M1b) non-small cell lung cancer, squamous cell carcinoma with PDL 1 expression 25%, presented with massive right upper lobe lung mass in addition to mediastinal lymphadenopathy and subcutaneous metastatic disease. Interestingly the patient is not symptomatic except from the subcutaneous nodule diagnosed in August 2017.  PRIOR THERAPY: Palliative radiotherapy to the right lung and abdominal wall mass under the care of Dr. Lisbeth Renshaw completed on 11/20/2015.  CURRENT THERAPY: Systemic chemotherapy with carboplatin for AUC of 5 and paclitaxel 175 MG/M2 every 3 weeks with Neulasta support. First dose 12/17/2015. Status post 3 cycles.  INTERVAL HISTORY: Keith Murray 55 y.o. male returns to the clinic today for follow-up visit. The patient is doing very well today with no specific complaints. He tolerated the first cycle of his chemotherapy with carboplatin and paclitaxel fairly well. He denied having any significant fever or chills. He has no nausea or vomiting. He denied having any significant chest pain, shortness of breath, cough or hemoptysis. He has no significant weight loss or night sweats. He was supposed to have repeat CT scan of the chest. For this visit but unfortunately this was not ordered when he was seen by the nurse practitioner last visit. He is here today to start cycle #4.  MEDICAL HISTORY: Past Medical History:  Diagnosis Date  . Anxiety   . Cancer ([place])    lung  . Polysubstance abuse    cocaine, etoh, marijuana  . Psychosis   . Schizoaffective disorder ([place])     ALLERGIES:  has No Known Allergies.  MEDICATIONS:  Current Outpatient Prescriptions  Medication Sig Dispense Refill  . albuterol (PROVENTIL) (2.5 MG/3ML) 0.083% nebulizer solution Take 3 mLs (2.5 mg total) by nebulization every 6 (six)  hours as needed for wheezing or shortness of breath. 75 mL 12  . benztropine (COGENTIN) 0.5 MG tablet Take 1 tablet (0.5 mg total) by mouth 2 (two) times daily. For prevention of drug induced tremors 60 tablet 1  . CVS VITAMIN B12 1000 MCG tablet Take 1,000 mcg by mouth daily.  0  . FLUoxetine (PROZAC) 20 MG capsule Take 1 capsule (20 mg total) by mouth daily. 60 capsule 1  . folic acid (FOLVITE) 1 MG tablet Take 1 mg by mouth daily.  3  . gabapentin (NEURONTIN) 300 MG capsule Take 1 capsule (300 mg total) by mouth 3 (three) times daily. 90 capsule 1  . haloperidol (HALDOL) 5 MG tablet Take 1 tablet (5 mg total) by mouth 2 (two) times daily. For mood control 60 tablet 1  . HYDROcodone-acetaminophen (NORCO/VICODIN) 5-325 MG tablet Take 1-2 tablets by mouth every 4 (four) hours as needed for moderate pain. 30 tablet 0  . Multiple Vitamin (MULTIVITAMIN WITH MINERALS) TABS tablet Take 1 tablet by mouth daily. May purchase over the counter: As a Vitamin supplement    . naproxen (EC-NAPROSYN) 500 MG EC tablet Take 1 tablet (500 mg total) by mouth 2 (two) times daily with a meal. 60 tablet 2  . nicotine (NICODERM CQ - DOSED IN MG/24 HOURS) 21 mg/24hr patch Place 1 patch (21 mg total) onto the skin daily. 28 patch 0  . phosphorus (K PHOS NEUTRAL) 155-852-130 MG tablet Take 1 tablet (250 mg total) by mouth 2 (two) times daily. 60 tablet 0  . prochlorperazine (COMPAZINE) 10 MG tablet Take 1 tablet (10  mg total) by mouth every 6 (six) hours as needed for nausea or vomiting. 30 tablet 0  . traZODone (DESYREL) 100 MG tablet Take 1 tablet (100 mg total) by mouth at bedtime. For insomnia 30 tablet 1   No current facility-administered medications for this visit.     SURGICAL HISTORY:  Past Surgical History:  Procedure Laterality Date  . HERNIA REPAIR      REVIEW OF SYSTEMS:  A comprehensive review of systems was negative.   PHYSICAL EXAMINATION: General appearance: alert, cooperative, fatigued and no  distress Head: Normocephalic, without obvious abnormality, atraumatic Neck: no adenopathy, no JVD, supple, symmetrical, trachea midline and thyroid not enlarged, symmetric, no tenderness/mass/nodules Lymph nodes: Cervical, supraclavicular, and axillary nodes normal. Resp: diminished breath sounds RLL and dullness to percussion RLL Back: symmetric, no curvature. ROM normal. No CVA tenderness. Cardio: regular rate and rhythm, S1, S2 normal, no murmur, click, rub or gallop GI: soft, non-tender; bowel sounds normal; no masses,  no organomegaly Extremities: extremities normal, atraumatic, no cyanosis or edema Neurologic: Alert and oriented X 3, normal strength and tone. Normal symmetric reflexes. Normal coordination and gait  ECOG PERFORMANCE STATUS: 1 - Symptomatic but completely ambulatory  Blood pressure 113/73, pulse 85, temperature 98.3 F (36.8 C), temperature source Oral, resp. rate 17, height '6\' 2"'$  (1.88 m), weight 166 lb 3.2 oz (75.4 kg), SpO2 100 %.  LABORATORY DATA: Lab Results  Component Value Date   WBC 3.3 (L) 02/17/2016   HGB 11.0 (L) 02/17/2016   HCT 33.3 (L) 02/17/2016   MCV 92.0 02/17/2016   PLT 179 02/17/2016      Chemistry      Component Value Date/Time   NA 140 02/17/2016 0958   K 4.3 02/17/2016 0958   CL 110 12/27/2015 1205   CO2 21 (L) 02/17/2016 0958   BUN 15.4 02/17/2016 0958   CREATININE 0.9 02/17/2016 0958      Component Value Date/Time   CALCIUM 9.3 02/17/2016 0958   ALKPHOS 157 (H) 02/17/2016 0958   AST 10 02/17/2016 0958   ALT 9 02/17/2016 0958   BILITOT 0.43 02/17/2016 0958       RADIOGRAPHIC STUDIES: No results found.  ASSESSMENT AND PLAN: This is a very pleasant 55 years old African-American male recently diagnosed with a stage IV non-small cell lung cancer, squamous cell carcinoma with PDL 1 expression 25%. Presented with large right upper lobe lung mass, mediastinal lymph nodes in addition to right pleural effusion and abdominal and  subcutaneous masses diagnosed in August 2017. The patient underwent palliative radiotherapy to the right upper lobe lung mass in addition to the right lower quadrant abdominal subcutaneous mass. He is currently undergoing systemic chemotherapy with carboplatin and paclitaxel is status post 3 cycles. He tolerated the last cycle of his treatment fairly well. I recommended for the patient to proceed with cycle #4 today. He would come back for follow-up visit in 3 weeks for evaluation before starting cycle #5 after repeating CT scan of the chest, abdomen and pelvis for restaging of his disease. The patient was advised to call immediately if he has any concerning symptoms in the interval. The patient voices understanding of current disease status and treatment options and is in agreement with the current care plan.  All questions were answered. The patient knows to call the clinic with any problems, questions or concerns. We can certainly see the patient much sooner if necessary.  Disclaimer: This note was dictated with voice recognition software. Similar sounding words can  inadvertently be transcribed and may not be corrected upon review.       

## 2016-02-17 NOTE — Patient Instructions (Addendum)
[facility] Discharge Instructions for Patients Receiving Chemotherapy  Today you received the following chemotherapy agents: Taxol and Carboplatin.   To help prevent nausea and vomiting after your treatment, we encourage you to take your nausea medication as directed.    If you develop nausea and vomiting that is not controlled by your nausea medication, call the clinic.   BELOW ARE SYMPTOMS THAT SHOULD BE REPORTED IMMEDIATELY:  *FEVER GREATER THAN 100.5 F  *CHILLS WITH OR WITHOUT FEVER  NAUSEA AND VOMITING THAT IS NOT CONTROLLED WITH YOUR NAUSEA MEDICATION  *UNUSUAL SHORTNESS OF BREATH  *UNUSUAL BRUISING OR BLEEDING  TENDERNESS IN MOUTH AND THROAT WITH OR WITHOUT PRESENCE OF ULCERS  *URINARY PROBLEMS  *BOWEL PROBLEMS  UNUSUAL RASH Items with * indicate a potential emergency and should be followed up as soon as possible.  Feel free to call the clinic you have any questions or concerns. The clinic phone number is [phone].    

## 2016-02-19 ENCOUNTER — Telehealth: Payer: Self-pay | Admitting: *Deleted

## 2016-02-19 NOTE — Telephone Encounter (Signed)
Per LOS I have scheduled appts and notified the scheduler 

## 2016-02-24 ENCOUNTER — Other Ambulatory Visit (HOSPITAL_BASED_OUTPATIENT_CLINIC_OR_DEPARTMENT_OTHER): Payer: Medicare Other

## 2016-02-24 DIAGNOSIS — C3411 Malignant neoplasm of upper lobe, right bronchus or lung: Secondary | ICD-10-CM | POA: Diagnosis not present

## 2016-02-24 LAB — CBC WITH DIFFERENTIAL/PLATELET
BASO%: 0.1 % (ref 0.0–2.0)
Basophils Absolute: 0 10*3/uL (ref 0.0–0.1)
EOS%: 1.2 % (ref 0.0–7.0)
Eosinophils Absolute: 0.2 10*3/uL (ref 0.0–0.5)
HCT: 31.3 % — ABNORMAL LOW (ref 38.4–49.9)
HGB: 10.3 g/dL — ABNORMAL LOW (ref 13.0–17.1)
LYMPH%: 7.4 % — ABNORMAL LOW (ref 14.0–49.0)
MCH: 30.6 pg (ref 27.2–33.4)
MCHC: 32.9 g/dL (ref 32.0–36.0)
MCV: 92.9 fL (ref 79.3–98.0)
MONO#: 1.2 10*3/uL — ABNORMAL HIGH (ref 0.1–0.9)
MONO%: 7.8 % (ref 0.0–14.0)
NEUT#: 12.4 10*3/uL — ABNORMAL HIGH (ref 1.5–6.5)
NEUT%: 83.5 % — ABNORMAL HIGH (ref 39.0–75.0)
Platelets: 130 10*3/uL — ABNORMAL LOW (ref 140–400)
RBC: 3.37 10*6/uL — ABNORMAL LOW (ref 4.20–5.82)
RDW: 19 % — ABNORMAL HIGH (ref 11.0–14.6)
WBC: 14.9 10*3/uL — ABNORMAL HIGH (ref 4.0–10.3)
lymph#: 1.1 10*3/uL (ref 0.9–3.3)

## 2016-02-24 LAB — COMPREHENSIVE METABOLIC PANEL
ALT: 23 U/L (ref 0–55)
AST: 20 U/L (ref 5–34)
Albumin: 3.7 g/dL (ref 3.5–5.0)
Alkaline Phosphatase: 214 U/L — ABNORMAL HIGH (ref 40–150)
Anion Gap: 8 mEq/L (ref 3–11)
BUN: 19 mg/dL (ref 7.0–26.0)
CO2: 22 mEq/L (ref 22–29)
Calcium: 9.3 mg/dL (ref 8.4–10.4)
Chloride: 107 mEq/L (ref 98–109)
Creatinine: 0.8 mg/dL (ref 0.7–1.3)
EGFR: >90 mL/min/{1.73_m2} (ref >90)
Glucose: 94 mg/dl (ref 70–140)
Potassium: 4.3 mEq/L (ref 3.5–5.1)
Sodium: 136 mEq/L (ref 136–145)
Total Bilirubin: 0.67 mg/dL (ref 0.20–1.20)
Total Protein: 7.4 g/dL (ref 6.4–8.3)

## 2016-03-02 ENCOUNTER — Other Ambulatory Visit (HOSPITAL_BASED_OUTPATIENT_CLINIC_OR_DEPARTMENT_OTHER): Payer: Medicare Other

## 2016-03-02 DIAGNOSIS — C3411 Malignant neoplasm of upper lobe, right bronchus or lung: Secondary | ICD-10-CM

## 2016-03-02 LAB — CBC WITH DIFFERENTIAL/PLATELET
BASO%: 0.3 % (ref 0.0–2.0)
Basophils Absolute: 0 10*3/uL (ref 0.0–0.1)
EOS%: 0.5 % (ref 0.0–7.0)
Eosinophils Absolute: 0 10*3/uL (ref 0.0–0.5)
HCT: 29.8 % — ABNORMAL LOW (ref 38.4–49.9)
HGB: 9.8 g/dL — ABNORMAL LOW (ref 13.0–17.1)
LYMPH%: 10.5 % — ABNORMAL LOW (ref 14.0–49.0)
MCH: 31.3 pg (ref 27.2–33.4)
MCHC: 32.9 g/dL (ref 32.0–36.0)
MCV: 95 fL (ref 79.3–98.0)
MONO#: 0.6 10*3/uL (ref 0.1–0.9)
MONO%: 7.1 % (ref 0.0–14.0)
NEUT#: 6.6 10*3/uL — ABNORMAL HIGH (ref 1.5–6.5)
NEUT%: 81.6 % — ABNORMAL HIGH (ref 39.0–75.0)
Platelets: 185 10*3/uL (ref 140–400)
RBC: 3.14 10*6/uL — ABNORMAL LOW (ref 4.20–5.82)
RDW: 21.2 % — ABNORMAL HIGH (ref 11.0–14.6)
WBC: 8.1 10*3/uL (ref 4.0–10.3)
lymph#: 0.8 10*3/uL — ABNORMAL LOW (ref 0.9–3.3)

## 2016-03-02 LAB — COMPREHENSIVE METABOLIC PANEL
ALT: 12 U/L (ref 0–55)
AST: 13 U/L (ref 5–34)
Albumin: 3.5 g/dL (ref 3.5–5.0)
Alkaline Phosphatase: 196 U/L — ABNORMAL HIGH (ref 40–150)
Anion Gap: 8 mEq/L (ref 3–11)
BUN: 7.2 mg/dL (ref 7.0–26.0)
CO2: 23 mEq/L (ref 22–29)
Calcium: 8.7 mg/dL (ref 8.4–10.4)
Chloride: 109 mEq/L (ref 98–109)
Creatinine: 1.1 mg/dL (ref 0.7–1.3)
EGFR: >90 mL/min/{1.73_m2} (ref >90)
Glucose: 98 mg/dl (ref 70–140)
Potassium: 4.4 mEq/L (ref 3.5–5.1)
Sodium: 140 mEq/L (ref 136–145)
Total Bilirubin: 0.29 mg/dL (ref 0.20–1.20)
Total Protein: 7.1 g/dL (ref 6.4–8.3)

## 2016-03-07 DIAGNOSIS — J189 Pneumonia, unspecified organism: Secondary | ICD-10-CM | POA: Diagnosis not present

## 2016-03-09 ENCOUNTER — Telehealth: Payer: Self-pay | Admitting: Internal Medicine

## 2016-03-09 ENCOUNTER — Ambulatory Visit (HOSPITAL_BASED_OUTPATIENT_CLINIC_OR_DEPARTMENT_OTHER): Payer: Medicare Other | Admitting: Internal Medicine

## 2016-03-09 ENCOUNTER — Ambulatory Visit: Payer: Medicare Other

## 2016-03-09 ENCOUNTER — Other Ambulatory Visit (HOSPITAL_BASED_OUTPATIENT_CLINIC_OR_DEPARTMENT_OTHER): Payer: Medicare Other

## 2016-03-09 ENCOUNTER — Encounter: Payer: Self-pay | Admitting: Internal Medicine

## 2016-03-09 ENCOUNTER — Other Ambulatory Visit: Payer: Self-pay | Admitting: Medical Oncology

## 2016-03-09 VITALS — BP 126/80 | HR 83 | Temp 98.5°F | Resp 18 | Ht 74.0 in | Wt 163.1 lb

## 2016-03-09 DIAGNOSIS — G620 Drug-induced polyneuropathy: Secondary | ICD-10-CM

## 2016-03-09 DIAGNOSIS — C3411 Malignant neoplasm of upper lobe, right bronchus or lung: Secondary | ICD-10-CM

## 2016-03-09 DIAGNOSIS — Z5111 Encounter for antineoplastic chemotherapy: Secondary | ICD-10-CM

## 2016-03-09 LAB — COMPREHENSIVE METABOLIC PANEL
ALT: 10 U/L (ref 0–55)
AST: 14 U/L (ref 5–34)
Albumin: 3.7 g/dL (ref 3.5–5.0)
Alkaline Phosphatase: 143 U/L (ref 40–150)
Anion Gap: 9 mEq/L (ref 3–11)
BUN: 17.9 mg/dL (ref 7.0–26.0)
CO2: 20 mEq/L — ABNORMAL LOW (ref 22–29)
Calcium: 9.3 mg/dL (ref 8.4–10.4)
Chloride: 111 mEq/L — ABNORMAL HIGH (ref 98–109)
Creatinine: 0.9 mg/dL (ref 0.7–1.3)
EGFR: >90 mL/min/{1.73_m2} (ref >90)
Glucose: 83 mg/dl (ref 70–140)
Potassium: 4 mEq/L (ref 3.5–5.1)
Sodium: 139 mEq/L (ref 136–145)
Total Bilirubin: 0.61 mg/dL (ref 0.20–1.20)
Total Protein: 7.4 g/dL (ref 6.4–8.3)

## 2016-03-09 LAB — CBC WITH DIFFERENTIAL/PLATELET
BASO%: 0.5 % (ref 0.0–2.0)
Basophils Absolute: 0 10*3/uL (ref 0.0–0.1)
EOS%: 1.5 % (ref 0.0–7.0)
Eosinophils Absolute: 0.1 10*3/uL (ref 0.0–0.5)
HCT: 31.3 % — ABNORMAL LOW (ref 38.4–49.9)
HGB: 10.2 g/dL — ABNORMAL LOW (ref 13.0–17.1)
LYMPH%: 21.3 % (ref 14.0–49.0)
MCH: 31 pg (ref 27.2–33.4)
MCHC: 32.5 g/dL (ref 32.0–36.0)
MCV: 95.4 fL (ref 79.3–98.0)
MONO#: 0.6 10*3/uL (ref 0.1–0.9)
MONO%: 15.8 % — ABNORMAL HIGH (ref 0.0–14.0)
NEUT#: 2.3 10*3/uL (ref 1.5–6.5)
NEUT%: 60.9 % (ref 39.0–75.0)
Platelets: 185 10*3/uL (ref 140–400)
RBC: 3.28 10*6/uL — ABNORMAL LOW (ref 4.20–5.82)
RDW: 20.9 % — ABNORMAL HIGH (ref 11.0–14.6)
WBC: 3.8 10*3/uL — ABNORMAL LOW (ref 4.0–10.3)
lymph#: 0.8 10*3/uL — ABNORMAL LOW (ref 0.9–3.3)

## 2016-03-09 NOTE — Telephone Encounter (Signed)
Appointments scheduled per 03/09/16 los. A copy of the appointment schedule and AVS report was given to the patient, per 03/09/16 los. ° °

## 2016-03-09 NOTE — Progress Notes (Signed)
[place] [phone]   [phone]  OFFICE PROGRESS NOTE  Grant Fontana, MD (Inactive) No address on file  DIAGNOSIS: Stage IV (T3, N2, M1 B) non-small cell lung cancer, squamous cell carcinoma with PDL 1 expression of 25% presented with massive right upper lobe lung mass as well as mediastinal lymphadenopathy and subcutaneous metastatic disease diagnosed in August 2017.  PRIOR THERAPY: Palliative radiotherapy to the right lung and abdominal wall mass under the care of Dr. Lisbeth Renshaw completed on 11/20/2015.  CURRENT THERAPY: Systemic chemotherapy with carboplatin for AUC of 5 and paclitaxel 175 MG/M2 every 3 weeks with Neulasta support. Status post 4 cycles. First dose was given on 12/17/2015.  INTERVAL HISTORY: Keith Murray 55 y.o. male returns to the clinic today for follow-up visit. The patient tolerating his current treatment with carboplatin and paclitaxel fairly well with no significant adverse effects. He denied having any fever or chills. He has no nausea or vomiting. He denied having any significant weight loss or night sweats. He has no chest pain, shortness breath, cough or hemoptysis. He has mild peripheral neuropathy. He was supposed to have repeat CT scan of the chest, abdomen and pelvis after cycle #3 but unfortunately the patient missed several appointments because of lack of transportation and his scan is is scheduled for 03/15/2016. He is here today for evaluation before starting cycle #5.  MEDICAL HISTORY: Past Medical History:  Diagnosis Date  . Anxiety   . Cancer ([place])    lung  . Polysubstance abuse    cocaine, etoh, marijuana  . Psychosis   . Schizoaffective disorder ([place])     ALLERGIES:  has No Known Allergies.  MEDICATIONS:  Current Outpatient Prescriptions  Medication Sig Dispense Refill  . albuterol (PROVENTIL) (2.5 MG/3ML) 0.083% nebulizer solution Take 3 mLs (2.5 mg total) by nebulization every 6 (six) hours as  needed for wheezing or shortness of breath. 75 mL 12  . benztropine (COGENTIN) 0.5 MG tablet Take 1 tablet (0.5 mg total) by mouth 2 (two) times daily. For prevention of drug induced tremors 60 tablet 1  . CVS VITAMIN B12 1000 MCG tablet Take 1,000 mcg by mouth daily.  0  . FLUoxetine (PROZAC) 20 MG capsule Take 1 capsule (20 mg total) by mouth daily. 60 capsule 1  . folic acid (FOLVITE) 1 MG tablet Take 1 mg by mouth daily.  3  . gabapentin (NEURONTIN) 300 MG capsule Take 1 capsule (300 mg total) by mouth 3 (three) times daily. 90 capsule 1  . haloperidol (HALDOL) 5 MG tablet Take 1 tablet (5 mg total) by mouth 2 (two) times daily. For mood control 60 tablet 1  . HYDROcodone-acetaminophen (NORCO/VICODIN) 5-325 MG tablet Take 1-2 tablets by mouth every 4 (four) hours as needed for moderate pain. 30 tablet 0  . Multiple Vitamin (MULTIVITAMIN WITH MINERALS) TABS tablet Take 1 tablet by mouth daily. May purchase over the counter: As a Vitamin supplement    . naproxen (EC-NAPROSYN) 500 MG EC tablet Take 1 tablet (500 mg total) by mouth 2 (two) times daily with a meal. 60 tablet 2  . phosphorus (K PHOS NEUTRAL) 155-852-130 MG tablet Take 1 tablet (250 mg total) by mouth 2 (two) times daily. 60 tablet 0  . prochlorperazine (COMPAZINE) 10 MG tablet Take 1 tablet (10 mg total) by mouth every 6 (six) hours as needed for nausea or vomiting. 30 tablet 0  . traZODone (DESYREL) 100 MG tablet Take 1 tablet (100 mg  total) by mouth at bedtime. For insomnia 30 tablet 1  . nicotine (NICODERM CQ - DOSED IN MG/24 HOURS) 21 mg/24hr patch Place 1 patch (21 mg total) onto the skin daily. (Patient not taking: Reported on 03/09/2016) 28 patch 0   No current facility-administered medications for this visit.     SURGICAL HISTORY:  Past Surgical History:  Procedure Laterality Date  . HERNIA REPAIR      REVIEW OF SYSTEMS:  A comprehensive review of systems was negative except for: Neurological: positive for paresthesia     PHYSICAL EXAMINATION: General appearance: alert, cooperative and no distress Head: Normocephalic, without obvious abnormality, atraumatic Neck: no adenopathy, no JVD, supple, symmetrical, trachea midline and thyroid not enlarged, symmetric, no tenderness/mass/nodules Lymph nodes: Cervical, supraclavicular, and axillary nodes normal. Resp: clear to auscultation bilaterally Back: symmetric, no curvature. ROM normal. No CVA tenderness. Cardio: regular rate and rhythm, S1, S2 normal, no murmur, click, rub or gallop GI: soft, non-tender; bowel sounds normal; no masses,  no organomegaly Extremities: extremities normal, atraumatic, no cyanosis or edema  ECOG PERFORMANCE STATUS: 1 - Symptomatic but completely ambulatory  Blood pressure 126/80, pulse 83, temperature 98.5 F (36.9 C), temperature source Oral, resp. rate 18, height '6\' 2"'$  (1.88 m), weight 163 lb 1.6 oz (74 kg), SpO2 100 %.  LABORATORY DATA: Lab Results  Component Value Date   WBC 3.8 (L) 03/09/2016   HGB 10.2 (L) 03/09/2016   HCT 31.3 (L) 03/09/2016   MCV 95.4 03/09/2016   PLT 185 03/09/2016      Chemistry      Component Value Date/Time   NA 139 03/09/2016 1116   K 4.0 03/09/2016 1116   CL 110 12/27/2015 1205   CO2 20 (L) 03/09/2016 1116   BUN 17.9 03/09/2016 1116   CREATININE 0.9 03/09/2016 1116      Component Value Date/Time   CALCIUM 9.3 03/09/2016 1116   ALKPHOS 143 03/09/2016 1116   AST 14 03/09/2016 1116   ALT 10 03/09/2016 1116   BILITOT 0.61 03/09/2016 1116       RADIOGRAPHIC STUDIES: No results found.  ASSESSMENT AND PLAN: This is a very pleasant 55 years old African-American male with a stage IV non-small cell lung cancer, squamous cell carcinoma. The patient is currently undergoing systemic chemotherapy with carboplatin and paclitaxel is status post 4 cycles. He is tolerating his treatment well except for mild peripheral neuropathy. I recommended for the patient to proceed with cycle #5 today as a  scheduled. He would have restaging CT scan of the chest, abdomen and pelvis on 03/15/2016. I strongly encouraged the patient to keep his appointment for the scan. I would see him back for follow-up visit in 3 weeks for evaluation and discussion of his scan results before starting cycle #6 of his treatment. He was advised to call immediately if he has any concerning symptoms in the interval. The patient voices understanding of current disease status and treatment options and is in agreement with the current care plan.  All questions were answered. The patient knows to call the clinic with any problems, questions or concerns. We can certainly see the patient much sooner if necessary.  I spent 10 minutes counseling the patient face to face. The total time spent in the appointment was 15 minutes.  Disclaimer: This note was dictated with voice recognition software. Similar sounding words can inadvertently be transcribed and may not be corrected upon review.

## 2016-03-15 ENCOUNTER — Encounter: Payer: Self-pay | Admitting: Medical Oncology

## 2016-03-15 ENCOUNTER — Ambulatory Visit (HOSPITAL_COMMUNITY)
Admission: RE | Admit: 2016-03-15 | Discharge: 2016-03-15 | Disposition: A | Discharge status: Home / Self Care | Payer: Medicare Other | Source: Ambulatory Visit | Attending: Internal Medicine | Admitting: Internal Medicine

## 2016-03-15 ENCOUNTER — Encounter (HOSPITAL_COMMUNITY): Payer: Self-pay

## 2016-03-15 ENCOUNTER — Ambulatory Visit (HOSPITAL_COMMUNITY): Payer: Medicare Other

## 2016-03-15 DIAGNOSIS — C7889 Secondary malignant neoplasm of other digestive organs: Secondary | ICD-10-CM | POA: Insufficient documentation

## 2016-03-15 DIAGNOSIS — Z5111 Encounter for antineoplastic chemotherapy: Secondary | ICD-10-CM | POA: Diagnosis not present

## 2016-03-15 DIAGNOSIS — D630 Anemia in neoplastic disease: Secondary | ICD-10-CM | POA: Insufficient documentation

## 2016-03-15 DIAGNOSIS — J439 Emphysema, unspecified: Secondary | ICD-10-CM | POA: Insufficient documentation

## 2016-03-15 DIAGNOSIS — C3411 Malignant neoplasm of upper lobe, right bronchus or lung: Secondary | ICD-10-CM | POA: Insufficient documentation

## 2016-03-15 MED ORDER — IOPAMIDOL (ISOVUE-370) INJECTION 76%
INTRAVENOUS | Status: AC
  Filled 2016-03-15: qty 100

## 2016-03-15 MED ORDER — IOPAMIDOL (ISOVUE-300) INJECTION 61%
100.0000 mL | Freq: Once | INTRAVENOUS | Status: DC | PRN
  Administered 2016-03-15: 100 mL via INTRAVENOUS
  Filled 2016-03-15: qty 100

## 2016-03-16 ENCOUNTER — Other Ambulatory Visit (HOSPITAL_BASED_OUTPATIENT_CLINIC_OR_DEPARTMENT_OTHER): Payer: Medicare Other

## 2016-03-16 DIAGNOSIS — C3411 Malignant neoplasm of upper lobe, right bronchus or lung: Secondary | ICD-10-CM

## 2016-03-16 LAB — COMPREHENSIVE METABOLIC PANEL
ALT: 13 U/L (ref 0–55)
AST: 20 U/L (ref 5–34)
Albumin: 4.2 g/dL (ref 3.5–5.0)
Alkaline Phosphatase: 154 U/L — ABNORMAL HIGH (ref 40–150)
Anion Gap: 10 mEq/L (ref 3–11)
BUN: 17.5 mg/dL (ref 7.0–26.0)
CO2: 22 mEq/L (ref 22–29)
Calcium: 9.3 mg/dL (ref 8.4–10.4)
Chloride: 105 mEq/L (ref 98–109)
Creatinine: 1.1 mg/dL (ref 0.7–1.3)
EGFR: 83 mL/min/{1.73_m2} — ABNORMAL LOW (ref >90)
Glucose: 79 mg/dl (ref 70–140)
Potassium: 4.4 mEq/L (ref 3.5–5.1)
Sodium: 137 mEq/L (ref 136–145)
Total Bilirubin: 0.61 mg/dL (ref 0.20–1.20)
Total Protein: 7.7 g/dL (ref 6.4–8.3)

## 2016-03-16 LAB — CBC WITH DIFFERENTIAL/PLATELET
BASO%: 1.1 % (ref 0.0–2.0)
Basophils Absolute: 0 10*3/uL (ref 0.0–0.1)
EOS%: 1.4 % (ref 0.0–7.0)
Eosinophils Absolute: 0.1 10*3/uL (ref 0.0–0.5)
HCT: 33.3 % — ABNORMAL LOW (ref 38.4–49.9)
HGB: 10.9 g/dL — ABNORMAL LOW (ref 13.0–17.1)
LYMPH%: 23.5 % (ref 14.0–49.0)
MCH: 31.7 pg (ref 27.2–33.4)
MCHC: 32.7 g/dL (ref 32.0–36.0)
MCV: 96.9 fL (ref 79.3–98.0)
MONO#: 0.4 10*3/uL (ref 0.1–0.9)
MONO%: 9.4 % (ref 0.0–14.0)
NEUT#: 2.5 10*3/uL (ref 1.5–6.5)
NEUT%: 64.6 % (ref 39.0–75.0)
Platelets: 231 10*3/uL (ref 140–400)
RBC: 3.44 10*6/uL — ABNORMAL LOW (ref 4.20–5.82)
RDW: 20.5 % — ABNORMAL HIGH (ref 11.0–14.6)
WBC: 3.8 10*3/uL — ABNORMAL LOW (ref 4.0–10.3)
lymph#: 0.9 10*3/uL (ref 0.9–3.3)

## 2016-03-17 ENCOUNTER — Encounter: Payer: Self-pay | Admitting: *Deleted

## 2016-03-17 NOTE — Progress Notes (Signed)
QI encounter for [facility]

## 2016-03-23 ENCOUNTER — Other Ambulatory Visit (HOSPITAL_BASED_OUTPATIENT_CLINIC_OR_DEPARTMENT_OTHER): Payer: Medicare Other

## 2016-03-23 DIAGNOSIS — C3411 Malignant neoplasm of upper lobe, right bronchus or lung: Secondary | ICD-10-CM

## 2016-03-23 LAB — CBC WITH DIFFERENTIAL/PLATELET
BASO%: 0.3 % (ref 0.0–2.0)
Basophils Absolute: 0 10*3/uL (ref 0.0–0.1)
EOS%: 3.9 % (ref 0.0–7.0)
Eosinophils Absolute: 0.2 10*3/uL (ref 0.0–0.5)
HCT: 34.3 % — ABNORMAL LOW (ref 38.4–49.9)
HGB: 11.3 g/dL — ABNORMAL LOW (ref 13.0–17.1)
LYMPH%: 25.1 % (ref 14.0–49.0)
MCH: 32 pg (ref 27.2–33.4)
MCHC: 32.9 g/dL (ref 32.0–36.0)
MCV: 97.2 fL (ref 79.3–98.0)
MONO#: 0.4 10*3/uL (ref 0.1–0.9)
MONO%: 9.9 % (ref 0.0–14.0)
NEUT#: 2.3 10*3/uL (ref 1.5–6.5)
NEUT%: 60.8 % (ref 39.0–75.0)
Platelets: 221 10*3/uL (ref 140–400)
RBC: 3.53 10*6/uL — ABNORMAL LOW (ref 4.20–5.82)
RDW: 18 % — ABNORMAL HIGH (ref 11.0–14.6)
WBC: 3.8 10*3/uL — ABNORMAL LOW (ref 4.0–10.3)
lymph#: 1 10*3/uL (ref 0.9–3.3)

## 2016-03-23 LAB — COMPREHENSIVE METABOLIC PANEL
ALT: 9 U/L (ref 0–55)
AST: 14 U/L (ref 5–34)
Albumin: 3.8 g/dL (ref 3.5–5.0)
Alkaline Phosphatase: 146 U/L (ref 40–150)
Anion Gap: 7 mEq/L (ref 3–11)
BUN: 9.7 mg/dL (ref 7.0–26.0)
CO2: 22 mEq/L (ref 22–29)
Calcium: 9.5 mg/dL (ref 8.4–10.4)
Chloride: 112 mEq/L — ABNORMAL HIGH (ref 98–109)
Creatinine: 1 mg/dL (ref 0.7–1.3)
EGFR: >90 mL/min/{1.73_m2} (ref >90)
Glucose: 96 mg/dl (ref 70–140)
Potassium: 4.2 mEq/L (ref 3.5–5.1)
Sodium: 141 mEq/L (ref 136–145)
Total Bilirubin: 0.54 mg/dL (ref 0.20–1.20)
Total Protein: 7.6 g/dL (ref 6.4–8.3)

## 2016-03-30 ENCOUNTER — Ambulatory Visit: Payer: Self-pay | Admitting: Internal Medicine

## 2016-03-30 ENCOUNTER — Ambulatory Visit: Payer: Self-pay

## 2016-03-30 ENCOUNTER — Other Ambulatory Visit: Payer: Self-pay

## 2016-03-30 NOTE — Progress Notes (Deleted)
03/30/2016 No show for infusion appt

## 2016-04-06 ENCOUNTER — Other Ambulatory Visit: Payer: Self-pay

## 2016-04-07 DIAGNOSIS — J189 Pneumonia, unspecified organism: Secondary | ICD-10-CM | POA: Diagnosis not present

## 2016-04-08 ENCOUNTER — Other Ambulatory Visit: Payer: Self-pay

## 2016-04-13 ENCOUNTER — Other Ambulatory Visit: Payer: Self-pay

## 2016-04-20 ENCOUNTER — Ambulatory Visit: Payer: Self-pay | Admitting: Internal Medicine

## 2016-04-20 ENCOUNTER — Ambulatory Visit: Payer: Medicare Other

## 2016-04-20 ENCOUNTER — Other Ambulatory Visit: Payer: Self-pay

## 2016-04-27 ENCOUNTER — Other Ambulatory Visit: Payer: Self-pay

## 2016-05-04 ENCOUNTER — Other Ambulatory Visit (HOSPITAL_BASED_OUTPATIENT_CLINIC_OR_DEPARTMENT_OTHER): Payer: Medicare Other

## 2016-05-04 DIAGNOSIS — C3411 Malignant neoplasm of upper lobe, right bronchus or lung: Secondary | ICD-10-CM | POA: Diagnosis not present

## 2016-05-04 LAB — COMPREHENSIVE METABOLIC PANEL
ALT: 8 U/L (ref 0–55)
AST: 12 U/L (ref 5–34)
Albumin: 3.8 g/dL (ref 3.5–5.0)
Alkaline Phosphatase: 145 U/L (ref 40–150)
Anion Gap: 8 mEq/L (ref 3–11)
BUN: 10.4 mg/dL (ref 7.0–26.0)
CO2: 24 mEq/L (ref 22–29)
Calcium: 9.5 mg/dL (ref 8.4–10.4)
Chloride: 108 mEq/L (ref 98–109)
Creatinine: 1 mg/dL (ref 0.7–1.3)
EGFR: >90 mL/min/{1.73_m2} (ref >90)
Glucose: 80 mg/dl (ref 70–140)
Potassium: 4.5 mEq/L (ref 3.5–5.1)
Sodium: 140 mEq/L (ref 136–145)
Total Bilirubin: 0.69 mg/dL (ref 0.20–1.20)
Total Protein: 7.6 g/dL (ref 6.4–8.3)

## 2016-05-04 LAB — CBC WITH DIFFERENTIAL/PLATELET
BASO%: 0.3 % (ref 0.0–2.0)
Basophils Absolute: 0 10*3/uL (ref 0.0–0.1)
EOS%: 1.3 % (ref 0.0–7.0)
Eosinophils Absolute: 0.1 10*3/uL (ref 0.0–0.5)
HCT: 37.4 % — ABNORMAL LOW (ref 38.4–49.9)
HGB: 12 g/dL — ABNORMAL LOW (ref 13.0–17.1)
LYMPH%: 30.5 % (ref 14.0–49.0)
MCH: 33.4 pg (ref 27.2–33.4)
MCHC: 32.1 g/dL (ref 32.0–36.0)
MCV: 104.2 fL — ABNORMAL HIGH (ref 79.3–98.0)
MONO#: 0.3 10*3/uL (ref 0.1–0.9)
MONO%: 8.8 % (ref 0.0–14.0)
NEUT#: 2.2 10*3/uL (ref 1.5–6.5)
NEUT%: 59.1 % (ref 39.0–75.0)
Platelets: 200 10*3/uL (ref 140–400)
RBC: 3.59 10*6/uL — ABNORMAL LOW (ref 4.20–5.82)
RDW: 13.3 % (ref 11.0–14.6)
WBC: 3.7 10*3/uL — ABNORMAL LOW (ref 4.0–10.3)
lymph#: 1.1 10*3/uL (ref 0.9–3.3)

## 2016-05-08 DIAGNOSIS — J189 Pneumonia, unspecified organism: Secondary | ICD-10-CM | POA: Diagnosis not present

## 2016-05-11 ENCOUNTER — Ambulatory Visit: Payer: Self-pay

## 2016-05-11 ENCOUNTER — Other Ambulatory Visit: Payer: Self-pay

## 2016-05-11 ENCOUNTER — Ambulatory Visit: Payer: Self-pay | Admitting: Internal Medicine

## 2016-05-13 ENCOUNTER — Encounter (HOSPITAL_COMMUNITY): Payer: Self-pay

## 2016-05-13 NOTE — ED Triage Notes (Signed)
Pt was brought in by GDP found at [facility], pt family member had not brought pt medications to him in 3-4 days as pt brother in law had a family death.  Pt was voluntarily going to [facility] for txmt/medications, pt is unable to enter at this time.   Pt has not been taking medications, as no one has been able to bring them to him.  Per [employer] pt has been calm and cooperative, However after entry to TCU pt has been slamming doors and yelling while in room intermittently.

## 2016-05-14 ENCOUNTER — Inpatient Hospital Stay (HOSPITAL_COMMUNITY)
Admission: AD | Admit: 2016-05-14 | Discharge: 2016-05-18 | DRG: 885 | Disposition: A | Discharge status: Home / Self Care | Payer: 59 | Source: Intra-hospital | Attending: Psychiatry | Admitting: Psychiatry

## 2016-05-14 ENCOUNTER — Encounter (HOSPITAL_COMMUNITY): Payer: Self-pay

## 2016-05-14 ENCOUNTER — Emergency Department (HOSPITAL_COMMUNITY)
Admission: EM | Admit: 2016-05-14 | Discharge: 2016-05-14 | Disposition: A | Discharge status: Other Acute Inpatient Hospital | Payer: Medicare Other | Attending: Emergency Medicine | Admitting: Emergency Medicine

## 2016-05-14 DIAGNOSIS — Z9221 Personal history of antineoplastic chemotherapy: Secondary | ICD-10-CM

## 2016-05-14 DIAGNOSIS — Z79899 Other long term (current) drug therapy: Secondary | ICD-10-CM

## 2016-05-14 DIAGNOSIS — F1490 Cocaine use, unspecified, uncomplicated: Secondary | ICD-10-CM

## 2016-05-14 DIAGNOSIS — F259 Schizoaffective disorder, unspecified: Secondary | ICD-10-CM | POA: Diagnosis present

## 2016-05-14 DIAGNOSIS — F17210 Nicotine dependence, cigarettes, uncomplicated: Secondary | ICD-10-CM | POA: Diagnosis present

## 2016-05-14 DIAGNOSIS — C3411 Malignant neoplasm of upper lobe, right bronchus or lung: Secondary | ICD-10-CM | POA: Diagnosis present

## 2016-05-14 DIAGNOSIS — F29 Unspecified psychosis not due to a substance or known physiological condition: Secondary | ICD-10-CM | POA: Insufficient documentation

## 2016-05-14 DIAGNOSIS — Y905 Blood alcohol level of 100-119 mg/100 ml: Secondary | ICD-10-CM | POA: Diagnosis present

## 2016-05-14 DIAGNOSIS — F251 Schizoaffective disorder, depressive type: Secondary | ICD-10-CM | POA: Diagnosis not present

## 2016-05-14 DIAGNOSIS — F1290 Cannabis use, unspecified, uncomplicated: Secondary | ICD-10-CM | POA: Diagnosis present

## 2016-05-14 DIAGNOSIS — C792 Secondary malignant neoplasm of skin: Secondary | ICD-10-CM | POA: Diagnosis present

## 2016-05-14 DIAGNOSIS — F250 Schizoaffective disorder, bipolar type: Secondary | ICD-10-CM | POA: Diagnosis present

## 2016-05-14 DIAGNOSIS — F1410 Cocaine abuse, uncomplicated: Secondary | ICD-10-CM | POA: Diagnosis not present

## 2016-05-14 DIAGNOSIS — R443 Hallucinations, unspecified: Secondary | ICD-10-CM

## 2016-05-14 DIAGNOSIS — Z85118 Personal history of other malignant neoplasm of bronchus and lung: Secondary | ICD-10-CM | POA: Insufficient documentation

## 2016-05-14 DIAGNOSIS — Z91138 Patient's unintentional underdosing of medication regimen for other reason: Secondary | ICD-10-CM

## 2016-05-14 DIAGNOSIS — G4700 Insomnia, unspecified: Secondary | ICD-10-CM | POA: Diagnosis not present

## 2016-05-14 DIAGNOSIS — F17200 Nicotine dependence, unspecified, uncomplicated: Secondary | ICD-10-CM | POA: Insufficient documentation

## 2016-05-14 DIAGNOSIS — F4321 Adjustment disorder with depressed mood: Secondary | ICD-10-CM | POA: Clinically undetermined

## 2016-05-14 LAB — ETHANOL: Alcohol, Ethyl (B): 145 mg/dL — ABNORMAL HIGH (ref <5)

## 2016-05-14 LAB — COMPREHENSIVE METABOLIC PANEL
ALT: 9 U/L — ABNORMAL LOW (ref 17–63)
AST: 14 U/L — ABNORMAL LOW (ref 15–41)
Albumin: 4.4 g/dL (ref 3.5–5.0)
Alkaline Phosphatase: 114 U/L (ref 38–126)
Anion gap: 12 (ref 5–15)
BUN: 12 mg/dL (ref 6–20)
CO2: 19 mmol/L — ABNORMAL LOW (ref 22–32)
Calcium: 8.9 mg/dL (ref 8.9–10.3)
Chloride: 107 mmol/L (ref 101–111)
Creatinine, Ser: 1.01 mg/dL (ref 0.61–1.24)
GFR calc Af Amer: >60 mL/min (ref >60)
GFR calc non Af Amer: >60 mL/min (ref >60)
Glucose, Bld: 90 mg/dL (ref 65–99)
Potassium: 4 mmol/L (ref 3.5–5.1)
Sodium: 138 mmol/L (ref 135–145)
Total Bilirubin: 1.1 mg/dL (ref 0.3–1.2)
Total Protein: 8.1 g/dL (ref 6.5–8.1)

## 2016-05-14 LAB — RAPID URINE DRUG SCREEN, HOSP PERFORMED
Amphetamines: NOT DETECTED
Barbiturates: NOT DETECTED
Benzodiazepines: NOT DETECTED
Cocaine: POSITIVE — AB
Opiates: NOT DETECTED
Tetrahydrocannabinol: NOT DETECTED

## 2016-05-14 LAB — CBC
HCT: 34.1 % — ABNORMAL LOW (ref 39.0–52.0)
Hemoglobin: 11.5 g/dL — ABNORMAL LOW (ref 13.0–17.0)
MCH: 33 pg (ref 26.0–34.0)
MCHC: 33.7 g/dL (ref 30.0–36.0)
MCV: 97.7 fL (ref 78.0–100.0)
Platelets: 216 10*3/uL (ref 150–400)
RBC: 3.49 MIL/uL — ABNORMAL LOW (ref 4.22–5.81)
RDW: 12.7 % (ref 11.5–15.5)
WBC: 5.5 10*3/uL (ref 4.0–10.5)

## 2016-05-14 MED ORDER — BENZTROPINE MESYLATE 0.5 MG PO TABS: 0.5000 mg | ORAL_TABLET | Freq: Two times a day (BID) | ORAL | Status: DC

## 2016-05-14 MED ORDER — TRAZODONE HCL 100 MG PO TABS
100.0000 mg | ORAL_TABLET | Freq: Every day | ORAL | Status: DC
  Administered 2016-05-14 – 2016-05-17 (×4): 100 mg via ORAL
  Filled 2016-05-14 (×6): qty 1

## 2016-05-14 MED ORDER — ZOLPIDEM TARTRATE 5 MG PO TABS: 5.0000 mg | ORAL_TABLET | Freq: Every evening | ORAL | Status: DC | PRN

## 2016-05-14 MED ORDER — HALOPERIDOL 5 MG PO TABS
5.0000 mg | ORAL_TABLET | Freq: Two times a day (BID) | ORAL | Status: DC
  Administered 2016-05-14 – 2016-05-18 (×8): 5 mg via ORAL
  Filled 2016-05-14 (×10): qty 1

## 2016-05-14 MED ORDER — HALOPERIDOL 5 MG PO TABS
5.0000 mg | ORAL_TABLET | Freq: Two times a day (BID) | ORAL | Status: DC
Start: 2016-05-14 — End: 2016-05-14
  Administered 2016-05-14 (×2): 5 mg via ORAL
  Filled 2016-05-14 (×3): qty 1

## 2016-05-14 MED ORDER — LORAZEPAM 1 MG PO TABS
1.0000 mg | ORAL_TABLET | Freq: Three times a day (TID) | ORAL | Status: DC | PRN
  Administered 2016-05-14: 1 mg via ORAL
  Filled 2016-05-14: qty 1

## 2016-05-14 MED ORDER — TRAZODONE HCL 100 MG PO TABS
100.0000 mg | ORAL_TABLET | Freq: Every day | ORAL | Status: DC
  Administered 2016-05-14: 100 mg via ORAL
  Filled 2016-05-14 (×2): qty 1

## 2016-05-14 MED ORDER — IBUPROFEN 200 MG PO TABS: 600.0000 mg | ORAL_TABLET | Freq: Three times a day (TID) | ORAL | Status: DC | PRN

## 2016-05-14 MED ORDER — HALOPERIDOL 5 MG PO TABS: 5.0000 mg | ORAL_TABLET | Freq: Two times a day (BID) | ORAL | Status: DC

## 2016-05-14 MED ORDER — FLUOXETINE HCL 20 MG PO CAPS
20.0000 mg | ORAL_CAPSULE | Freq: Every day | ORAL | Status: DC
  Administered 2016-05-15 – 2016-05-18 (×4): 20 mg via ORAL
  Filled 2016-05-14 (×5): qty 1

## 2016-05-14 MED ORDER — BENZTROPINE MESYLATE 0.5 MG PO TABS
0.5000 mg | ORAL_TABLET | Freq: Two times a day (BID) | ORAL | Status: DC
  Administered 2016-05-14 – 2016-05-18 (×8): 0.5 mg via ORAL
  Filled 2016-05-14 (×10): qty 1

## 2016-05-14 MED ORDER — ACETAMINOPHEN 325 MG PO TABS: 650.0000 mg | ORAL_TABLET | ORAL | Status: DC | PRN

## 2016-05-14 MED ORDER — ALUM & MAG HYDROXIDE-SIMETH 200-200-20 MG/5ML PO SUSP: 30.0000 mL | ORAL | Status: DC | PRN

## 2016-05-14 MED ORDER — ACETAMINOPHEN 325 MG PO TABS
650.0000 mg | ORAL_TABLET | ORAL | Status: DC | PRN
  Administered 2016-05-15 – 2016-05-18 (×6): 650 mg via ORAL
  Filled 2016-05-14 (×6): qty 2

## 2016-05-14 MED ORDER — FLUOXETINE HCL 20 MG PO CAPS
20.0000 mg | ORAL_CAPSULE | Freq: Every day | ORAL | Status: DC
  Administered 2016-05-14: 20 mg via ORAL
  Filled 2016-05-14: qty 1

## 2016-05-14 MED ORDER — TRAZODONE HCL 100 MG PO TABS: 100.0000 mg | ORAL_TABLET | Freq: Every day | ORAL | Status: DC

## 2016-05-14 NOTE — ED Notes (Signed)
Pt ambulatory w/o difficulty to [facility] w/ [place], belongings sent with driver.

## 2016-05-14 NOTE — ED Notes (Signed)
Pt. Now states he does not have SI, HI or AVH.

## 2016-05-14 NOTE — BH Assessment (Addendum)
Tele Assessment Note   Keith Murray is an 56 y.o. male, who presented voluntarily and unaccompanied to [facility]. Pt was a poor historian during the assessment. Pt reported, wanting to hurt people "that get in my motherfucking way."  Pt reported, having hallucinations that he believes are real. Clinician asked the pt to describe his hallucinations. Pt reported, "you have to ask the cops." Pt denied SI and self-injurious behaviors.   Per [place], [place] note: "Keith Murray is a 56 y.o. male with a history of anxiety, lung cancer, polysubstance abuse, psychosis, and schizoaffective disorder, brought in by [facility], who presents to the Emergency Department. Pt states he lives alone in a hotel and states his nieces won't take him to his chemotherapy treatments. Per pt, he has appointments every Wednesdays, but has missed several treatments. Per triage note, pt was voluntarily going to [facility] for treatment and medication. He has not been taking his medication and no one has been able to bring them to him. He endorses sleep disturbance, dysphoric mood and auditory and visual hallucinations; pt states that his hallucinations are "real". Per pt, his hallucinations do not tell him to hurt himself or others. Pt reports drinking 3 beers tonight PTA. He endorses marijuana use. He denies any SI/HI. Pt has no other complaints or symptoms at this time. [employer] reports pt was found at [facility] and states pt has not been taking his medications for 3-4 days due to family member having a family death."   Clinician was unable to assess pt's history of abuse. Per pt's chart his BAL is 145 at 0022 and his UDS is positive for cocaine. Pt denied being linked to OPT resources (medication management and/or counseling.) Pt reported, then denied pervious inpatient admissions.   Pt presents alert in scrubs with argumentative speech. Pt's eye contact was poor. Pt's mood was irritable. Pt's affect is irritable and preoccupied. Pt's thought process is  circumstantial. Pt's concentration, insight and impulse control are poor. Clinician was unable to assess pt's orientation.   Diagnosis: Schizoaffective Disorder [facility])  Past Medical History:  Past Medical History:  Diagnosis Date  . Anxiety   . Cancer ([place])    lung  . Polysubstance abuse    cocaine, etoh, marijuana  . Psychosis   . Schizoaffective disorder [facility])     Past Surgical History:  Procedure Laterality Date  . HERNIA REPAIR      Family History:  Family History  Problem Relation Age of Onset  . Cancer Neg Hx   . Schizophrenia Neg Hx     Social History:  reports that he has been smoking.  He has a 2.00 pack-year smoking history. He has never used smokeless tobacco. He reports that he uses drugs, including Cocaine and Marijuana. He reports that he does not drink alcohol.  Additional Social History:  Alcohol / Drug Use Pain Medications: See MAR Prescriptions: See MAR  Over the Counter: See MAR History of alcohol / drug use?: Yes Substance #1 Name of Substance 1: Cocaine 1 - Age of First Use: UTA 1 - Amount (size/oz): UTA 1 - Frequency: UTA 1 - Duration: UTA 1 - Last Use / Amount: UTA Substance #2 Name of Substance 2: Alcohol 2 - Age of First Use: UTA 2 - Amount (size/oz): Per pt's chart, pt's BAL is 145 as of 0022. 2 - Frequency: UTA 2 - Duration: UTA 2 - Last Use / Amount: UTA  CIWA: CIWA-Ar BP: 153/90 Pulse Rate: 91 COWS:    PATIENT STRENGTHS: (choose at  least two) Average or above average intelligence General fund of knowledge  Allergies: No Known Allergies  Home Medications:  (Not in a hospital admission)  OB/GYN Status:  No LMP for male patient.  General Assessment Data Location of Assessment: [facility] TTS Assessment: In system Is this a Tele or Face-to-Face Assessment?: Face-to-Face Is this an Initial Assessment or a Re-assessment for this encounter?: Initial Assessment Marital status: Other (comment) (UTA) Is patient pregnant?:  No Pregnancy Status: No Living Arrangements: Other (Comment), Alone [facility]) Can pt return to current living arrangement?: Yes Admission Status: Voluntary Is patient capable of signing voluntary admission?: Yes Referral Source: Other ([employer]) Insurance type: [employer]     Crisis Care Plan Living Arrangements: Other (Comment), Alone [employer]) Legal Guardian: Other: (Self) Name of Psychiatrist: [place] Name of Therapist: UTA  Education Status Is patient currently in school?:  (UTA) Current Grade: UTA Highest grade of school patient has completed: [employer] Name of school: UTA Contact person: UTA  Risk to self with the past 6 months Suicidal Ideation: No Has patient been a risk to self within the past 6 months prior to admission? : No Suicidal Intent: No Has patient had any suicidal intent within the past 6 months prior to admission? : No Is patient at risk for suicide?: No Suicidal Plan?: No Has patient had any suicidal plan within the past 6 months prior to admission? : No Access to Means: No What has been your use of drugs/alcohol within the last 12 months?: Pt's BAL is 145 at 0022 and UDS is positive for cocaine Previous Attempts/Gestures:  (UTA) Other Self Harm Risks: Pt denies.  Triggers for Past Attempts:  (UTA) Intentional Self Injurious Behavior: None (Pt denies.) Family Suicide History: Unable to assess Recent stressful life event(s): Other (Comment) (UTA) Persecutory voices/beliefs?:  (UTA) Depression:  (UTA) Substance abuse history and/or treatment for substance abuse?: Yes Suicide prevention information given to non-admitted patients: Not applicable  Risk to Others within the past 6 months Homicidal Ideation: Yes-Currently Present Does patient have any lifetime risk of violence toward others beyond the six months prior to admission? : Unknown Thoughts of Harm to Others:  (UTA) Current Homicidal Intent: Yes-Currently Present Current  Homicidal Plan: No Access to Homicidal Means: No Identified Victim: Pt reported anyone that messes with him.  History of harm to others?:  (UTA) Assessment of Violence:  (UTA) Violent Behavior Description: UTA Does patient have access to weapons?: No (Pt denies. ) Criminal Charges Pending?: No Does patient have a court date: No Is patient on probation?: No  Psychosis Hallucinations: Auditory, Visual Delusions: Unspecified  Mental Status Report Appearance/Hygiene: In scrubs Eye Contact: Poor Motor Activity: Restlessness, Freedom of movement Speech: Argumentative Level of Consciousness: Alert Mood: Irritable Affect: Irritable, Preoccupied Anxiety Level: None Thought Processes: Circumstantial Judgement: Unimpaired Orientation: Unable to assess Obsessive Compulsive Thoughts/Behaviors: Unable to Assess  Cognitive Functioning Concentration: Poor Memory: Unable to Assess IQ: Average Insight: Poor Impulse Control: Poor Appetite:  (UTA) Sleep: Unable to Assess Vegetative Symptoms: Unable to Assess  ADLScreening [facility] Assessment Services) Patient's cognitive ability adequate to safely complete daily activities?: Yes Patient able to express need for assistance with ADLs?: Yes Independently performs ADLs?: Yes (appropriate for developmental age)  Prior Inpatient Therapy Prior Inpatient Therapy: Yes Prior Therapy Dates: UTA Prior Therapy Facilty/Provider(s): UTA Reason for Treatment: UTA  Prior Outpatient Therapy Prior Outpatient Therapy: No Prior Therapy Dates: NA Prior Therapy Facilty/Provider(s): NA Reason for Treatment: NA Does patient have an ACCT team?: No  Does patient have Intensive In-House Services?  : No Does patient have [facility] services? : No Does patient have P4CC services?: No  ADL Screening (condition at time of admission) Patient's cognitive ability adequate to safely complete daily activities?: Yes Is the patient deaf or have difficulty hearing?:  No Does the patient have difficulty seeing, even when wearing glasses/contacts?: Yes Does the patient have difficulty concentrating, remembering, or making decisions?: Yes Patient able to express need for assistance with ADLs?: Yes Does the patient have difficulty dressing or bathing?: No Independently performs ADLs?: Yes (appropriate for developmental age) Does the patient have difficulty walking or climbing stairs?: No Weakness of Legs: None Weakness of Arms/Hands: None       Abuse/Neglect Assessment (Assessment to be complete while patient is alone) Physical Abuse:  (UTA) Verbal Abuse:  (UTA) Sexual Abuse:  (UTA)     Advance Directives (For Healthcare) Does Patient Have a Medical Advance Directive?: No    Additional Information 1:1 In Past 12 Months?: No CIRT Risk: No Elopement Risk: No Does patient have medical clearance?: No     Disposition:  Lindon Romp, NP recommends inpatient treatment. Disposition discussed with Bethena Roys, RN. TTS will seek placement. Disposition Initial Assessment Completed for this Encounter: Yes Disposition of Patient: Inpatient treatment program Type of inpatient treatment program: Adult  Edd Fabian 05/14/2016 2:00 AM   Edd Fabian, MS, [facility], [facility] Triage Specialist [phone]

## 2016-05-14 NOTE — ED Notes (Signed)
Hourly rounding reveals patient sleeping in room. No complaints, stable, in no acute distress. Q15 minute rounds and monitoring via Security Cameras to continue. 

## 2016-05-14 NOTE — ED Notes (Signed)
Up in room pleasant, polite

## 2016-05-14 NOTE — ED Notes (Signed)
Hourly rounding reveals patient in room. No complaints, stable, in no acute distress. Q15 minute rounds and monitoring via Security Cameras to continue. 

## 2016-05-14 NOTE — ED Notes (Signed)
Up to the bathroom 

## 2016-05-14 NOTE — ED Notes (Signed)
Patient has been constantly punching the side rails to the bed and pushing the bed side table. Per Hassell Done bed side table has been removed.

## 2016-05-14 NOTE — ED Notes (Signed)
Patient refused vitals Rn Dominica Severin made aware

## 2016-05-14 NOTE — Progress Notes (Signed)
Patient ID: Keith Murray, male   DOB: [birth year], 56 y.o.   MRN: [id number] Pt arrives to [facility] via [place] from [facility], pt repots that he is angry with his family, states that he has been off his meds "because my family didn't bring them to me", pt currently living in hotel and his family brings him his medications to take however pt has not received his meds In about 4 days, denies SI/HI/AVH, no complaints at this time other than not being happy about being hospitalized although patient is here voluntarily, oriented pt to unit and rules, provided pt with dinner meal in room.

## 2016-05-14 NOTE — Progress Notes (Addendum)
Nursing Progress Note 7p-7a  D) Patient presents anxious but is calm and cooperative with Probation officer. Patient states that he has been "having trouble sleeping" prior to admission. Patient forwards little but is polite with writing using phrases such as "yes ma'am" and "no ma'am". Patient states "I don't really like taking haldol" but is agreeable to taking it tonight. Patient provided opportunity to refuse medication and verbalized understanding. Patient encouraged to speak with provider tomorrow. Patient did not attend group but did come to the dayroom for snacks. Patient denies SI/HI/AVH or pain. Patient contracts for safety at this time.   A) Emotional support given. 1:1 interaction and active listening provided. Patient medicated with PM orders as prescribed. Medications reviewed with patient. Patient verbalized understanding of medications without further questions.  Snacks and fluids provided. Opportunities for questions or concerns presented to patient. Patient encouraged to continue to work on treatment goals. Labs, vital signs and patient behavior monitored throughout shift. Patient safety maintained with q15 min safety checks.  R) Patient receptive to interaction with nurse. Patient remains safe on the unit at this time. Patient denies any adverse medication reactions at this time. Patient is resting in bed without complaints. Will continue to monitor.

## 2016-05-14 NOTE — ED Notes (Signed)
Up tot he bathroom to shower and changes scrubs

## 2016-05-14 NOTE — ED Notes (Signed)
Snack and beverage given. 

## 2016-05-14 NOTE — ED Provider Notes (Signed)
[place] DEPT Provider Note   CSN: [id number] Arrival date & time: 05/14/16  0003 By signing my name below, I, Dyke Brackett, attest that this documentation has been prepared under the direction and in the presence of non-physician practitioner, Harlene Ramus, PA-C. Electronically Signed: Dyke Brackett, Scribe. 05/14/2016. 12:34 AM.   History   Chief Complaint Chief Complaint  Patient presents with  . Paranoid   HPI Keith Murray is a 56 y.o. male with a history of anxiety, lung cancer, polysubstance abuse, psychosis, and schizoaffective disorder, brought in by [facility], who presents to the Emergency Department. Pt states he lives alone in a hotel and states his nieces won't take him to his chemotherapy treatments. Per pt, he has appointments every Wednesdays, but has missed several treatments. Per triage note, pt was voluntarily going to [facility] for treatment and medication. He has not been taking his medication and no one has been able to bring them to him. He endorses sleep disturbance, dysphoric mood and auditory and visual hallucinations; pt states that his hallucinations are "real". Per pt, his hallucinations do not tell him to hurt himself or others. Pt reports drinking 3 beers tonight PTA. He endorses marijuana use. He denies any SI/HI. Pt has no other complaints or symptoms at this time. [employer] reports pt was found at [facility] and states pt has not been taking his medications for 3-4 days due to family member having a family death.   The history is provided by the patient. No language interpreter was used.   Past Medical History:  Diagnosis Date  . Anxiety   . Cancer ([place])    lung  . Polysubstance abuse    cocaine, etoh, marijuana  . Psychosis   . Schizoaffective disorder [facility])     Patient Active Problem List   Diagnosis Date Noted  . Anemia complicating neoplastic disease 12/07/2015  . Encounter for antineoplastic chemotherapy 12/07/2015  . Swelling of right upper extremity  11/07/2015  . Skin metastases ([place]) 11/06/2015  . Pain of metastatic malignancy 11/06/2015  . Malignant neoplasm of right upper lobe of lung ([place]) 11/05/2015  . Hypophosphatemia 11/04/2015  . Hypercalcemia of malignancy 10/31/2015  . Lung mass 10/31/2015  . Postobstructive pneumonia 10/31/2015  . Hypercalcemia 10/31/2015  . Leukocytosis 10/31/2015  . Cocaine abuse 08/10/2015  . Cocaine-induced mood disorder ([place]) 08/10/2015  . Alcohol abuse with intoxication ([place]) 04/14/2015  . Cocaine abuse with cocaine-induced mood disorder ([place]) 04/14/2015  . Psychoses   . Schizoaffective disorder, bipolar type ([place])   . Alcohol dependence ([place]) 06/08/2013  . Polysubstance (excluding opioids) dependence ([place]) 08/08/2012    Past Surgical History:  Procedure Laterality Date  . HERNIA REPAIR      Home Medications    Prior to Admission medications   Medication Sig Start Date End Date Taking? Authorizing Provider  albuterol (PROVENTIL) (2.5 MG/3ML) 0.083% nebulizer solution Take 3 mLs (2.5 mg total) by nebulization every 6 (six) hours as needed for wheezing or shortness of breath. 11/06/15  Yes Christina P Rama, MD  benztropine (COGENTIN) 0.5 MG tablet Take 1 tablet (0.5 mg total) by mouth 2 (two) times daily. For prevention of drug induced tremors 12/28/15  Yes Patrecia Pour, NP  [employer] VITAMIN B12 1000 MCG tablet Take 1,000 mcg by mouth daily. 12/22/15  Yes Historical Provider, MD  FLUoxetine (PROZAC) 20 MG capsule Take 1 capsule (20 mg total) by mouth daily. 12/28/15  Yes Patrecia Pour, NP  folic acid (FOLVITE) 1 MG tablet Take 1 mg by  mouth daily. 12/22/15  Yes Historical Provider, MD  gabapentin (NEURONTIN) 300 MG capsule Take 1 capsule (300 mg total) by mouth 3 (three) times daily. 12/28/15  Yes Patrecia Pour, NP  haloperidol (HALDOL) 5 MG tablet Take 1 tablet (5 mg total) by mouth 2 (two) times daily. For mood control 12/28/15  Yes Patrecia Pour, NP  HYDROcodone-acetaminophen (NORCO/VICODIN) 5-325  MG tablet Take 1-2 tablets by mouth every 4 (four) hours as needed for moderate pain. 12/07/15  Yes Curt Bears, MD  Multiple Vitamin (MULTIVITAMIN WITH MINERALS) TABS tablet Take 1 tablet by mouth daily. May purchase over the counter: As a Vitamin supplement 04/15/15  Yes Encarnacion Slates, NP  naproxen (EC-NAPROSYN) 500 MG EC tablet Take 1 tablet (500 mg total) by mouth 2 (two) times daily with a meal. 11/06/15  Yes Venetia Maxon Rama, MD  prochlorperazine (COMPAZINE) 10 MG tablet Take 1 tablet (10 mg total) by mouth every 6 (six) hours as needed for nausea or vomiting. 12/07/15  Yes Curt Bears, MD  traZODone (DESYREL) 100 MG tablet Take 1 tablet (100 mg total) by mouth at bedtime. For insomnia 12/28/15  Yes Patrecia Pour, NP  nicotine (NICODERM CQ - DOSED IN MG/24 HOURS) 21 mg/24hr patch Place 1 patch (21 mg total) onto the skin daily. Patient not taking: Reported on 03/09/2016 11/06/15   Venetia Maxon Rama, MD  phosphorus (K PHOS NEUTRAL) [phone] MG tablet Take 1 tablet (250 mg total) by mouth 2 (two) times daily. Patient not taking: Reported on 05/14/2016 11/07/15   Venetia Maxon Rama, MD    Family History Family History  Problem Relation Age of Onset  . Cancer Neg Hx   . Schizophrenia Neg Hx     Social History Social History  Substance Use Topics  . Smoking status: Current Every Day Smoker    Packs/day: 0.50    Years: 4.00  . Smokeless tobacco: Never Used  . Alcohol use No     Comment: reports not drinking any longer    Allergies   Patient has no known allergies.  Review of Systems Review of Systems  Musculoskeletal: Positive for myalgias.  Psychiatric/Behavioral: Positive for dysphoric mood, hallucinations and sleep disturbance.  All other systems reviewed and are negative.  Physical Exam Updated Vital Signs BP 153/90 (BP Location: Left Arm)   Pulse 91   Temp 98.3 F (36.8 C) (Oral)   Resp 20   SpO2 100%   Physical Exam  Constitutional: He is oriented to person,  place, and time. He appears well-developed and well-nourished.  HENT:  Head: Normocephalic and atraumatic.  Mouth/Throat: Oropharynx is clear and moist. No oropharyngeal exudate.  Eyes: Conjunctivae and EOM are normal. Right eye exhibits no discharge. Left eye exhibits no discharge. No scleral icterus.  Neck: Normal range of motion. Neck supple.  Cardiovascular: Normal rate, regular rhythm, normal heart sounds and intact distal pulses.   Pulmonary/Chest: Effort normal and breath sounds normal. No respiratory distress. He has no wheezes. He has no rales. He exhibits no tenderness.  Abdominal: Soft. Bowel sounds are normal. He exhibits no distension and no mass. There is no tenderness. There is no rebound and no guarding. No hernia.  Musculoskeletal: He exhibits no edema.  Neurological: He is alert and oriented to person, place, and time.  Skin: Skin is warm and dry.  Psychiatric: His speech is normal. His affect is inappropriate. He is hyperactive. Cognition and memory are impaired. He expresses inappropriate judgment.  Nursing note and vitals reviewed.  ED Treatments / Results  DIAGNOSTIC STUDIES:  Oxygen Saturation is 100% on RA, normal by my interpretation.    COORDINATION OF CARE:  12:36 AM Discussed treatment plan with pt at bedside and pt agreed to plan.  Labs (all labs ordered are listed, but only abnormal results are displayed) Labs Reviewed  COMPREHENSIVE METABOLIC PANEL - Abnormal; Notable for the following:       Result Value   CO2 19 (*)    AST 14 (*)    ALT 9 (*)    All other components within normal limits  ETHANOL - Abnormal; Notable for the following:    Alcohol, Ethyl (B) 145 (*)    All other components within normal limits  CBC - Abnormal; Notable for the following:    RBC 3.49 (*)    Hemoglobin 11.5 (*)    HCT 34.1 (*)    All other components within normal limits  RAPID URINE DRUG SCREEN, HOSP PERFORMED - Abnormal; Notable for the following:    Cocaine  POSITIVE (*)    All other components within normal limits  SALICYLATE LEVEL  ACETAMINOPHEN LEVEL    EKG  EKG Interpretation None       Radiology No results found.  Procedures Procedures (including critical care time)  Medications Ordered in ED Medications  LORazepam (ATIVAN) tablet 1 mg (1 mg Oral Given 05/14/16 0129)  acetaminophen (TYLENOL) tablet 650 mg (not administered)  ibuprofen (ADVIL,MOTRIN) tablet 600 mg (not administered)  zolpidem (AMBIEN) tablet 5 mg (not administered)  alum & mag hydroxide-simeth (MAALOX/MYLANTA) 200-200-20 MG/5ML suspension 30 mL (not administered)  traZODone (DESYREL) tablet 100 mg (100 mg Oral Given 05/14/16 0129)  FLUoxetine (PROZAC) capsule 20 mg (not administered)  haloperidol (HALDOL) tablet 5 mg (5 mg Oral Given 05/14/16 0129)     Initial Impression / Assessment and Plan / ED Course  I have reviewed the triage vital signs and the nursing notes.  Pertinent labs & imaging results that were available during my care of the patient were reviewed by me and considered in my medical decision making (see chart for details).    Pt presents to ED via [employer]. Hx of anxiety, lung cancer, polysubstance abuse, psychosis, and schizoaffective disorder. Pt reports he has not been taking his medications for the past few days/weeks due to his family not giving him his medications. Endorses hallucinations, denies SI/HI. Reports drinking alcohol, and smoking marijuana. VSS. Exam unremarkable. Pt with abnormal behavior on exam, inappropriate affect and judgement. Labs ordered for med clearance and home meds ordered. EtOH 145. UDS positive for cocaine. Consulted TTS. Behavioral Health recommends inpatient treatment.  Final Clinical Impressions(s) / ED Diagnoses   Final diagnoses:  Psychosis, unspecified psychosis type  Hallucinations    New Prescriptions New Prescriptions   No medications on file   I personally performed the services described in this  documentation, which was scribed in my presence. The recorded information has been reviewed and is accurate.    Chesley Noon [place], [place] 29/79/89 [id number]    Delora Fuel, MD 41/74/08 [id number]

## 2016-05-14 NOTE — Plan of Care (Signed)
Problem: Safety: Goal: Periods of time without injury will increase Outcome: Progressing Patient denies SI/HI and contracts verbally for safety. Patient is on q15 minute safety checks.

## 2016-05-14 NOTE — ED Notes (Signed)
Pt reports not having his medicine in 3-4 days.
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
## 2016-05-14 NOTE — ED Notes (Signed)
Talking in the dayroom

## 2016-05-14 NOTE — Tx Team (Signed)
Initial Treatment Plan 05/14/2016 6:21 PM Keith Murray ASN:[id number]    PATIENT STRESSORS: Marital or family conflict Medication change or noncompliance Substance abuse   PATIENT STRENGTHS: Capable of independent living General fund of knowledge Supportive family/friends   PATIENT IDENTIFIED PROBLEMS: "Get back on my meds"  "get out of here"                   DISCHARGE CRITERIA:  Improved stabilization in mood, thinking, and/or behavior Motivation to continue treatment in a less acute level of care Verbal commitment to aftercare and medication compliance  PRELIMINARY DISCHARGE PLAN: Attend aftercare/continuing care group Outpatient therapy Return to previous living arrangement  PATIENT/FAMILY INVOLVEMENT: This treatment plan has been presented to and reviewed with the patient, Keith Murray.  The patient and family have been given the opportunity to ask questions and make suggestions.  Dola Factor, RN 05/14/2016, 6:21 PM

## 2016-05-14 NOTE — Progress Notes (Signed)
Did not attend group 

## 2016-05-14 NOTE — ED Notes (Signed)
Pt. Transferred to [facility] from ED to room 37 after screening for contraband. Report to include Situation, Background, Assessment and Recommendations from RN. Pt. Oriented to unit including Q15 minute rounds as well as the security cameras for their protection. Patient is alert and oriented, warm and dry. Patient refuses to answer questions about SI, HI, and AVH. Pt. Encouraged to let me know if needs arise. Pt. Slamming bedside table against wall and yelling out despite redirection.

## 2016-05-15 DIAGNOSIS — F1410 Cocaine abuse, uncomplicated: Secondary | ICD-10-CM

## 2016-05-15 DIAGNOSIS — Z79899 Other long term (current) drug therapy: Secondary | ICD-10-CM

## 2016-05-15 DIAGNOSIS — F251 Schizoaffective disorder, depressive type: Secondary | ICD-10-CM

## 2016-05-15 MED ORDER — ZIPRASIDONE MESYLATE 20 MG IM SOLR: 20.0000 mg | Freq: Two times a day (BID) | INTRAMUSCULAR | Status: DC | PRN

## 2016-05-15 MED ORDER — LORAZEPAM 1 MG PO TABS: 1.0000 mg | ORAL_TABLET | ORAL | Status: DC | PRN

## 2016-05-15 NOTE — BHH Group Notes (Signed)
[place] Group Notes:  (Clinical Social Work)  05/15/2016  11:00AM-12:00PM  Summary of Progress/Problems:  The main focus of today's process group was to listen to a variety of genres of music and to identify that different types of music provoke different responses.  The patient then was able to identify personally what was soothing for them, as well as energizing, as well as how patient can personally use this knowledge in sleep habits, with depression, and with other symptoms.  The patient expressed at the beginning of group the overall feeling of "good" and demonstrated this the entire group with singing, smiling, and dancing.  Type of Therapy:  Music Therapy   Participation Level:  Active  Participation Quality:  Attentive and Sharing  Affect:  Appropriate  Cognitive:  Oriented  Insight:  Engaged  Engagement in Therapy:  Engaged  Modes of Intervention:   Activity, Exploration  Selmer Dominion, LCSW 05/15/2016

## 2016-05-15 NOTE — Progress Notes (Signed)
Adult Psychoeducational Group Note  Date:  05/15/2016 Time:  8:59 PM  Group Topic/Focus:  Wrap-Up Group:   The focus of this group is to help patients review their daily goal of treatment and discuss progress on daily workbooks.  Participation Level:  Did Not Attend  Additional Comments:  Pt was invited to attend, however pt was in bed.  Clint Bolder 05/15/2016, 8:59 PM

## 2016-05-15 NOTE — [facility] Suicide Risk Assessment (Signed)
[facility] Admission Suicide Risk Assessment   Nursing information obtained from:    Demographic factors:    Current Mental Status:    Loss Factors:    Historical Factors:    Risk Reduction Factors:     Total Time spent with patient: 1.5 hours Principal Problem: <principal problem not specified> Diagnosis:   Patient Active Problem List   Diagnosis Date Noted  . Anemia complicating neoplastic disease [D63.0] 12/07/2015  . Encounter for antineoplastic chemotherapy [Z51.11] 12/07/2015  . Swelling of right upper extremity [M79.89] 11/07/2015  . Skin metastases ([place]) [C79.2] 11/06/2015  . Pain of metastatic malignancy [G89.3] 11/06/2015  . Malignant neoplasm of right upper lobe of lung ([place]) [C34.11] 11/05/2015  . Hypophosphatemia [E83.39] 11/04/2015  . Hypercalcemia of malignancy [E83.52] 10/31/2015  . Lung mass [R91.8] 10/31/2015  . Postobstructive pneumonia [J18.9] 10/31/2015  . Hypercalcemia [E83.52] 10/31/2015  . Leukocytosis [D72.829] 10/31/2015  . Cocaine abuse [F14.10] 08/10/2015  . Cocaine-induced mood disorder ([place]) [F14.94] 08/10/2015  . Alcohol abuse with intoxication ([place]) [F10.129] 04/14/2015  . Cocaine abuse with cocaine-induced mood disorder ([place]) [F14.14] 04/14/2015  . Psychoses [F29]   . Schizoaffective disorder, bipolar type ([place]) [F25.0]   . Alcohol dependence ([place]) [F10.20] 06/08/2013  . Schizoaffective disorder ([place]) [F25.9] 08/08/2012  . Polysubstance (excluding opioids) dependence ([place]) [F19.20] 08/08/2012   Subjective Data: alert, oriented, somewhat irritable at presentation and dysphoric  Continued Clinical Symptoms:  Alcohol Use Disorder Identification Test Final Score (AUDIT): 1 The "Alcohol Use Disorders Identification Test", Guidelines for Use in Primary Care, Second Edition.  World Pharmacologist [facility]). Score between 0-7:  no or low risk or alcohol related problems. Score between 8-15:  moderate risk of alcohol related problems. Score between 16-19:   high risk of alcohol related problems. Score 20 or above:  warrants further diagnostic evaluation for alcohol dependence and treatment.   CLINICAL FACTORS:   Alcohol/Substance Abuse/Dependencies Schizophrenia:   Depressive state More than one psychiatric diagnosis Unstable or Poor Therapeutic Relationship Previous Psychiatric Diagnoses and Treatments   Musculoskeletal: Strength & Muscle Tone: within normal limits Gait & Station: normal Patient leans: no lean  Psychiatric Specialty Exam: Physical Exam  Review of Systems  Cardiovascular: Negative for chest pain.  Gastrointestinal: Negative for nausea.  Psychiatric/Behavioral: Positive for depression and substance abuse.    Blood pressure 97/77, pulse 62, temperature 98.5 F (36.9 C), temperature source Oral, resp. rate 18, height '6\' 1"'$  (1.854 m), weight 65.3 kg (144 lb), SpO2 100 %.Body mass index is 19 kg/m.  General Appearance: Casual  Eye Contact:  Fair  Speech:  Slow  Volume:  Normal  Mood:  Depressed  Affect:  Congruent  Thought Process:  Goal Directed  Orientation:  Full (Time, Place, and Person)  Thought Content:  Rumination  Suicidal Thoughts:  No  Homicidal Thoughts:  No  Memory:  Immediate;   Fair Recent;   Fair  Judgement:  Poor  Insight:  Shallow  Psychomotor Activity:  Normal  Concentration:  Concentration: Fair and Attention Span: Fair  Recall:  [employer] of Knowledge:  Fair  Language:  Fair  Akathisia:  Negative  Handed:  Right  AIMS (if indicated):     Assets:  Desire for Improvement  ADL's:  Intact  Cognition:  WNL  Sleep:  Number of Hours: 6.25      COGNITIVE FEATURES THAT CONTRIBUTE TO RISK:  Closed-mindedness and Polarized thinking    SUICIDE RISK:   Moderate:  Frequent suicidal ideation with limited intensity, and duration, some  specificity in terms of plans, no associated intent, good self-control, limited dysphoria/symptomatology, some risk factors present, and identifiable  protective factors, including available and accessible social support.  PLAN OF CARE: Admit for stabilization . Medication management and substance use disorder.  I certify that inpatient services furnished can reasonably be expected to improve the patient's condition.   Merian Capron, MD 05/15/2016, 11:31 AM

## 2016-05-15 NOTE — H&P (Signed)
Psychiatric Admission Assessment Adult  Patient Identification: Keith Murray MRN:  [id number] Date of Evaluation:  05/15/2016 Chief Complaint:  Schozoaffective disorder Principal Diagnosis: Schizoaffective disorder ([place]) Diagnosis:   Patient Active Problem List   Diagnosis Date Noted  . Anemia complicating neoplastic disease [D63.0] 12/07/2015  . Encounter for antineoplastic chemotherapy [Z51.11] 12/07/2015  . Swelling of right upper extremity [M79.89] 11/07/2015  . Skin metastases ([place]) [C79.2] 11/06/2015  . Pain of metastatic malignancy [G89.3] 11/06/2015  . Malignant neoplasm of right upper lobe of lung ([place]) [C34.11] 11/05/2015  . Hypophosphatemia [E83.39] 11/04/2015  . Hypercalcemia of malignancy [E83.52] 10/31/2015  . Lung mass [R91.8] 10/31/2015  . Postobstructive pneumonia [J18.9] 10/31/2015  . Hypercalcemia [E83.52] 10/31/2015  . Leukocytosis [D72.829] 10/31/2015  . Cocaine use [F14.10] 08/10/2015  . Cocaine-induced mood disorder ([place]) [F14.94] 08/10/2015  . Alcohol abuse with intoxication ([place]) [F10.129] 04/14/2015  . Cocaine abuse with cocaine-induced mood disorder ([place]) [F14.14] 04/14/2015  . Psychoses [F29]   . Schizoaffective disorder, bipolar type ([place]) [F25.0]   . Alcohol dependence ([place]) [F10.20] 06/08/2013  . Schizoaffective disorder ([place]) [F25.9] 08/08/2012  . Polysubstance (excluding opioids) dependence ([place]) [F19.20] 08/08/2012   History of Present Illness: Per behavioral health therapeutic triage assessment- Keith Murray an 56 y.o.male, who presented voluntarily and unaccompanied to [facility]. Pt was a poor historian during the assessment. Pt reported, wanting to hurt people "that get in my motherfucking way." Pt reported, having hallucinations that he believes are real. Clinician asked the pt to describe his hallucinations. Pt reported, "you have to ask the cops." Pt denied SI and self-injurious behaviors. Per [place], [place] note: "Keith Murray a 56 y.o.malewith  a history of anxiety, lung cancer, polysubstance abuse, psychosis, and schizoaffective disorder, brought in by [facility], who presents to the Emergency Department. Pt states helives alone in a hotel and states his nieces won't take him to his chemotherapy treatments. Per pt, he has appointments every Wednesdays, but has missed several treatments. Per triage note, pt was voluntarily going to [facility] for treatment and medication. He has not been taking his medication and no one has been able to bring them to him. He endorses sleep disturbance, dysphoric mood and auditory and visual hallucinations; pt states that his hallucinations are "real". Per pt, his hallucinations do not tell him to hurt himself or others. Pt reports drinking 3 beers tonight PTA. He endorses marijuana use. He denies any SI/HI. Pt has no other complaints or symptoms at this time. [facility] reports pt was found at [facility] and states pt has not been taking his medications for 3-4 days due to family member having a family death." Clinician was unable to assess pt's history of abuse. Per pt's chart his BAL is 145 at 0022 and his UDS is positive for cocaine. Pt denied being linked to OPT resources (medication management and/or counseling.) Pt reported, then denied pervious inpatient admissions. Pt presents alert in scrubs with argumentative speech. Pt's eye contact was poor. Pt's mood was irritable. Pt's affect is irritable and preoccupied. Pt's thought process is circumstantial. Pt's concentration, insight and impulse control are poor. Clinician was unable to assess pt's orientation.  Associated Signs/Symptoms: Depression Symptoms:  depressed mood, anxiety, (Hypo) Manic Symptoms:  Irritable Mood, Labiality of Mood, Anxiety Symptoms:  Excessive Worry, Psychotic Symptoms:  Paranoia, PTSD Symptoms: NA Total Time spent with patient: 30 minutes  Past Psychiatric History:  Is the patient at risk to self? Yes.    Has the patient been a risk to self in the  past 6  months? Yes.    Has the patient been a risk to self within the distant past? Yes.    Is the patient a risk to others? No.  Has the patient been a risk to others in the past 6 months? No.  Has the patient been a risk to others within the distant past? No.   Prior Inpatient Therapy:   Prior Outpatient Therapy:    Alcohol Screening: 1. How often do you have a drink containing alcohol?: Monthly or less 2. How many drinks containing alcohol do you have on a typical day when you are drinking?: 1 or 2 3. How often do you have six or more drinks on one occasion?: Never Preliminary Score: 0 9. Have you or someone else been injured as a result of your drinking?: No 10. Has a relative or friend or a doctor or another health worker been concerned about your drinking or suggested you cut down?: No Alcohol Use Disorder Identification Test Final Score (AUDIT): 1 Brief Intervention: AUDIT score less than 7 or less-screening does not suggest unhealthy drinking-brief intervention not indicated Substance Abuse History in the last 12 months:  Yes.   Consequences of Substance Abuse: Withdrawal Symptoms:   None Previous Psychotropic Medications: YES Psychological Evaluations:YES Past Medical History:  Past Medical History:  Diagnosis Date  . Anxiety   . Cancer ([place])    lung  . Polysubstance abuse    cocaine, etoh, marijuana  . Psychosis   . Schizoaffective disorder [facility])     Past Surgical History:  Procedure Laterality Date  . HERNIA REPAIR     Family History:  Family History  Problem Relation Age of Onset  . Cancer Neg Hx   . Schizophrenia Neg Hx    Family Psychiatric  History:  Tobacco Screening: Have you used any form of tobacco in the last 30 days? (Cigarettes, Smokeless Tobacco, Cigars, and/or Pipes): Yes Tobacco use, Select all that apply: 5 or more cigarettes per day Are you interested in Tobacco Cessation Medications?: Yes, will notify MD for an order Counseled patient on  smoking cessation including recognizing danger situations, developing coping skills and basic information about quitting provided: Yes Social History:  History  Alcohol Use  . Yes    Comment: occasionally drinks 1 to 2 beers     History  Drug Use  . Types: Cocaine, Marijuana    Comment: THC about once per week and cocaine on occasion.     Additional Social History:                           Allergies:  No Known Allergies Lab Results:  Results for orders placed or performed during the hospital encounter of 05/14/16 (from the past 48 hour(s))  Rapid urine drug screen (hospital performed)     Status: Abnormal   Collection Time: 05/14/16 12:21 AM  Result Value Ref Range   Opiates NONE DETECTED NONE DETECTED   Cocaine POSITIVE (A) NONE DETECTED   Benzodiazepines NONE DETECTED NONE DETECTED   Amphetamines NONE DETECTED NONE DETECTED   Tetrahydrocannabinol NONE DETECTED NONE DETECTED   Barbiturates NONE DETECTED NONE DETECTED    Comment:        DRUG SCREEN FOR MEDICAL PURPOSES ONLY.  IF CONFIRMATION IS NEEDED FOR ANY PURPOSE, NOTIFY LAB WITHIN 5 DAYS.        LOWEST DETECTABLE LIMITS FOR URINE DRUG SCREEN Drug Class       Cutoff (ng/mL) Amphetamine  1000 Barbiturate      200 Benzodiazepine   579 Tricyclics       038 Opiates          300 Cocaine          300 THC              50   Comprehensive metabolic panel     Status: Abnormal   Collection Time: 05/14/16 12:22 AM  Result Value Ref Range   Sodium 138 135 - 145 mmol/L   Potassium 4.0 3.5 - 5.1 mmol/L   Chloride 107 101 - 111 mmol/L   CO2 19 (L) 22 - 32 mmol/L   Glucose, Bld 90 65 - 99 mg/dL   BUN 12 6 - 20 mg/dL   Creatinine, Ser 1.01 0.61 - 1.24 mg/dL   Calcium 8.9 8.9 - 10.3 mg/dL   Total Protein 8.1 6.5 - 8.1 g/dL   Albumin 4.4 3.5 - 5.0 g/dL   AST 14 (L) 15 - 41 U/L   ALT 9 (L) 17 - 63 U/L   Alkaline Phosphatase 114 38 - 126 U/L   Total Bilirubin 1.1 0.3 - 1.2 mg/dL   GFR calc non Af Amer >60  >60 mL/min   GFR calc Af Amer >60 >60 mL/min    Comment: (NOTE) The eGFR has been calculated using the CKD EPI equation. This calculation has not been validated in all clinical situations. eGFR's persistently <60 mL/min signify possible Chronic Kidney Disease.    Anion gap 12 5 - 15  Ethanol     Status: Abnormal   Collection Time: 05/14/16 12:22 AM  Result Value Ref Range   Alcohol, Ethyl (B) 145 (H) <5 mg/dL    Comment:        LOWEST DETECTABLE LIMIT FOR SERUM ALCOHOL IS 5 mg/dL FOR MEDICAL PURPOSES ONLY   cbc     Status: Abnormal   Collection Time: 05/14/16 12:22 AM  Result Value Ref Range   WBC 5.5 4.0 - 10.5 K/uL   RBC 3.49 (L) 4.22 - 5.81 MIL/uL   Hemoglobin 11.5 (L) 13.0 - 17.0 g/dL   HCT 34.1 (L) 39.0 - 52.0 %   MCV 97.7 78.0 - 100.0 fL   MCH 33.0 26.0 - 34.0 pg   MCHC 33.7 30.0 - 36.0 g/dL   RDW 12.7 11.5 - 15.5 %   Platelets 216 150 - 400 K/uL    Blood Alcohol level:  Lab Results  Component Value Date   ETH 145 (H) 05/14/2016   ETH <5 33/38/3291    Metabolic Disorder Labs:  No results found for: HGBA1C, MPG No results found for: PROLACTIN No results found for: CHOL, TRIG, HDL, CHOLHDL, VLDL, LDLCALC  Current Medications: Current Facility-Administered Medications  Medication Dose Route Frequency Provider Last Rate Last Dose  . acetaminophen (TYLENOL) tablet 650 mg  650 mg Oral Q4H PRN Lurena Nida, NP   650 mg at 05/15/16 9166  . benztropine (COGENTIN) tablet 0.5 mg  0.5 mg Oral BID Lurena Nida, NP   0.5 mg at 05/15/16 0600  . FLUoxetine (PROZAC) capsule 20 mg  20 mg Oral Daily Lurena Nida, NP   20 mg at 05/15/16 4599  . haloperidol (HALDOL) tablet 5 mg  5 mg Oral BID Lurena Nida, NP   5 mg at 05/15/16 7741  . ziprasidone (GEODON) injection 20 mg  20 mg Intramuscular Q12H PRN Derrill Center, NP       And  . LORazepam (  ATIVAN) tablet 1 mg  1 mg Oral PRN Derrill Center, NP      . traZODone (DESYREL) tablet 100 mg  100 mg Oral QHS Lurena Nida, NP    100 mg at 05/14/16 2127   PTA Medications: Prescriptions Prior to Admission  Medication Sig Dispense Refill Last Dose  . albuterol (PROVENTIL) (2.5 MG/3ML) 0.083% nebulizer solution Take 3 mLs (2.5 mg total) by nebulization every 6 (six) hours as needed for wheezing or shortness of breath. 75 mL 12 unknown  . benztropine (COGENTIN) 0.5 MG tablet Take 1 tablet (0.5 mg total) by mouth 2 (two) times daily. For prevention of drug induced tremors 60 tablet 1 Past Week at Unknown time  . [employer] VITAMIN B12 1000 MCG tablet Take 1,000 mcg by mouth daily.  0 Past Week at Unknown time  . FLUoxetine (PROZAC) 20 MG capsule Take 1 capsule (20 mg total) by mouth daily. 60 capsule 1 Past Week at Unknown time  . folic acid (FOLVITE) 1 MG tablet Take 1 mg by mouth daily.  3 Past Week at Unknown time  . gabapentin (NEURONTIN) 300 MG capsule Take 1 capsule (300 mg total) by mouth 3 (three) times daily. 90 capsule 1 Past Week at Unknown time  . haloperidol (HALDOL) 5 MG tablet Take 1 tablet (5 mg total) by mouth 2 (two) times daily. For mood control 60 tablet 1 Past Week at Unknown time  . HYDROcodone-acetaminophen (NORCO/VICODIN) 5-325 MG tablet Take 1-2 tablets by mouth every 4 (four) hours as needed for moderate pain. 30 tablet 0 Past Week at Unknown time  . Multiple Vitamin (MULTIVITAMIN WITH MINERALS) TABS tablet Take 1 tablet by mouth daily. May purchase over the counter: As a Vitamin supplement   Past Week at Unknown time  . naproxen (EC-NAPROSYN) 500 MG EC tablet Take 1 tablet (500 mg total) by mouth 2 (two) times daily with a meal. 60 tablet 2 Past Week at Unknown time  . nicotine (NICODERM CQ - DOSED IN MG/24 HOURS) 21 mg/24hr patch Place 1 patch (21 mg total) onto the skin daily. (Patient not taking: Reported on 03/09/2016) 28 patch 0 Not Taking  . phosphorus (K PHOS NEUTRAL) 155-852-130 MG tablet Take 1 tablet (250 mg total) by mouth 2 (two) times daily. (Patient not taking: Reported on 05/14/2016) 60 tablet 0  Not Taking at Unknown time  . prochlorperazine (COMPAZINE) 10 MG tablet Take 1 tablet (10 mg total) by mouth every 6 (six) hours as needed for nausea or vomiting. 30 tablet 0 unknown  . traZODone (DESYREL) 100 MG tablet Take 1 tablet (100 mg total) by mouth at bedtime. For insomnia 30 tablet 1 Past Week at Unknown time    Musculoskeletal: Strength & Muscle Tone: within normal limits Gait & Station: normal Patient leans: N/A  Psychiatric Specialty Exam: Physical Exam  Nursing note and vitals reviewed. Constitutional: He is oriented to person, place, and time. He appears well-developed.  HENT:  Head: Normocephalic.  Cardiovascular: Normal rate.   Neurological: He is alert and oriented to person, place, and time.  Psychiatric: He has a normal mood and affect. His behavior is normal.    Review of Systems  Psychiatric/Behavioral: The patient is nervous/anxious.     Blood pressure 97/77, pulse 62, temperature 98.5 F (36.9 C), temperature source Oral, resp. rate 18, height _0  (1.854 m), weight 65.3 kg (144 lb), SpO2 100 %.Body mass index is 19 kg/m.  General Appearance: Casual and Guarded  Eye Contact:  Fair  Speech:  Clear and Coherent  Volume:  Normal  Mood:  Depressed and Irritable  Affect:  Depressed and Flat  Thought Process:  Coherent  Orientation:  Full (Time, Place, and Person)  Thought Content:  Paranoid Ideation and Rumination  Suicidal Thoughts:  No  Homicidal Thoughts:  No  Memory:  Immediate;   Fair Recent;   Poor Remote;   Fair  Judgement:  Fair  Insight:  Lacking  Psychomotor Activity:  Restlessness  Concentration:  Concentration: Fair  Recall:  [employer] of Knowledge:  Fair  Language:  Fair  Akathisia:  No  Handed:  Right  AIMS (if indicated):     Assets:  Communication Skills Desire for Improvement Resilience Social Support  ADL's:  Intact  Cognition:  WNL  Sleep:  Number of Hours: 6.25     I agree with current treatment plan on 05/15/2016,  Patient seen face-to-face for psychiatric evaluation follow-up, chart reviewed and case discussed with the MD [place] the information documented and agree with the treatment plan.  Treatment Plan Summary: Daily contact with patient to assess and evaluate symptoms and progress in treatment and Medication management   Continue with Haldol 5 mg and Cogentin 0.26m , Prozac 20 mg for mood stabilization. Continue with Trazodone 100 mg for insomnia  Will continue to monitor vitals ,medication compliance and treatment side effects while patient is here.  Reviewed labs BAL - 145, UDS - pos for cocaine CSW will start working on disposition.  Patient to participate in therapeutic milieu  Observation Level/Precautions:  15 minute checks  Laboratory:  CBC Chemistry Profile UDS UA  Psychotherapy:  individual and group session  Medications:  See Above  Consultations:  Psychiatry  Discharge Concerns:  Safety, stabilization, and risk of access to medication and medication stabilization   Estimated LOS:5-7 days  Other:     Physician Treatment Plan for Primary Diagnosis: Schizoaffective disorder ([place] Long Term Goal(s): Improvement in symptoms so as ready for discharge  Short Term Goals: Ability to identify changes in lifestyle to reduce recurrence of condition will improve, Ability to demonstrate self-control will improve and Ability to maintain clinical measurements within normal limits will improve  Physician Treatment Plan for Secondary Diagnosis: Principal Problem:   Schizoaffective disorder ([place]  Long Term Goal(s): Improvement in symptoms so as ready for discharge  Short Term Goals: Ability to identify changes in lifestyle to reduce recurrence of condition will improve, Ability to identify and develop effective coping behaviors will improve, Ability to maintain clinical measurements within normal limits will improve and Ability to identify triggers associated with substance  abuse/mental health issues will improve  I certify that inpatient services furnished can reasonably be expected to improve the patient's condition.    TDerrill Center NP 2/18/20181:10 PM  I have examined the patient and agreed with the findings of H&P and treatment plan. I have also done suicide assessment on this patient.

## 2016-05-15 NOTE — BHH Counselor (Signed)
Unable to rouse patient from bed to complete PSA at 9:30 am and 5 PM as patient was found under the covers (including his head) and unresponsive to prompts to awaken.  Sheilah Pigeon, LCSW

## 2016-05-15 NOTE — Progress Notes (Signed)
Patient ID: Keith Murray, male   DOB: 17-Jun-1960, 56 y.o.   MRN: [id number]    D: Pt has been very flat and depressed on the unit, he started of the morning off agitated requesting to leave. Pt reported that he did not need to be at [facility], he just needed his medication so that he could go. Pt signed a 72 hour request for discharge, then his attitude got better. Pt reported that his depression was a 0, his hopelessness was a 0, and his anxiety was a 0. Pt reported that he had no goal.  Pt reported being negative SI/HI, no AH/VH noted. A: 15 min checks continued for patient safety. R: Pt safety maintained.

## 2016-05-15 NOTE — Progress Notes (Signed)
Pt signed a 72 hour request for discharge on 05/15/16 '@1000'$  am. Benancio Deeds RN

## 2016-05-16 DIAGNOSIS — F17210 Nicotine dependence, cigarettes, uncomplicated: Secondary | ICD-10-CM

## 2016-05-16 DIAGNOSIS — F1490 Cocaine use, unspecified, uncomplicated: Secondary | ICD-10-CM

## 2016-05-16 DIAGNOSIS — G4700 Insomnia, unspecified: Secondary | ICD-10-CM

## 2016-05-16 DIAGNOSIS — F1290 Cannabis use, unspecified, uncomplicated: Secondary | ICD-10-CM

## 2016-05-16 MED ORDER — HYDROXYZINE HCL 25 MG PO TABS
25.0000 mg | ORAL_TABLET | Freq: Four times a day (QID) | ORAL | Status: DC | PRN
  Filled 2016-05-16: qty 10

## 2016-05-16 NOTE — Progress Notes (Signed)
Recreation Therapy Notes  Date: 05/16/16 Time: 1000 Location: 500 Hall Dayroom  Group Topic: Wellness  Goal Area(s) Addresses:  Patient will define components of whole wellness. Patient will verbalize benefit of whole wellness.  Intervention: Chair exercises, meditation  Activity: Chair Exercises, Meditation.  LRT introduced chair exercises as a way to get in some physical wellness and meditation as a way to exercise mental wellness.  LRT read script to allow patients to engage in both techniques. Patients were to follow along as LRT read from scripts to engage in both activities.  Education: Wellness, Dentist.   Education Outcome: Acknowledges education/In group clarification offered/Needs additional education.   Clinical Observations/Feedback: Pt did not attend group.   Victorino Sparrow, LRT/CTRS     Victorino Sparrow A 05/16/2016 12:43 PM

## 2016-05-16 NOTE — Progress Notes (Signed)
Patient has been bright denies SI, HI and AVH.  Patient has been compliant with medications, attended groups and been appropriate on the milieu.   Assess patient for safety offer medications as prescribed, engage patient in 1:1 staff talks.   Patient able to contract for safety.  Continue to monitor as planned.

## 2016-05-16 NOTE — Progress Notes (Signed)
Recreation Therapy Notes  INPATIENT RECREATION THERAPY ASSESSMENT  Patient Details Name: Keith Murray MRN: [id number] DOB: 09/09/1960 Today's Date: 05/16/2016  Patient Stressors: Family  Pt stated he was here because his niece didn't bring his medications and he was out of medicine. Pt stated his nieces were a stressor.  Coping Skills:   Avoidance, Exercise, Art/Dance, Talking, Music, Sports  Personal Challenges: Anger, Communication, Concentration, Decision-Making, Expressing Yourself, Problem-Solving, Relationships, Self-Esteem/Confidence, Social Interaction, Stress Management, Time Management, Trusting Others  Leisure Interests (2+):  Exercise - Lifting Weights, Exercise - Walking, Individual - TV (Cleaning, Cook on the grill)  Awareness of Community Resources:  Yes  Community Resources:  [employer], Engineer, drilling, Other (Comment) Sport and exercise psychologist)  Current Use: Yes  Patient Strengths:  Workout a lot; respect elders  Patient Identified Areas of Improvement:  Getting along; Talking  Current Recreation Participation:  Everyday  Patient Goal for Hospitalization:  "Get better, stay on medications and do what I'm supposed to do"  [place] of Residence:  [place] of Residence:  [place]  Current SI (including self-harm):  No  Current HI:  No  Consent to Intern Participation: N/A  Victorino Sparrow, LRT/CTRS  Victorino Sparrow A 05/16/2016, 3:01 PM

## 2016-05-16 NOTE — BHH Suicide Risk Assessment (Addendum)
[place] INPATIENT:  Family/Significant Other Suicide Prevention Education  Suicide Prevention Education:  Education Completed; No one has been identified by the patient as the family member/significant other with whom the patient will be residing, and identified as the person(s) who will aid the patient in the event of a mental health crisis (suicidal ideations/suicide attempt).  With written consent from the patient, the family member/significant other has been provided the following suicide prevention education, prior to the and/or following the discharge of the patient.  The suicide prevention education provided includes the following:  Suicide risk factors  Suicide prevention and interventions  National Suicide Hotline telephone number  [facility] assessment telephone number  [facility] Emergency Assistance [place] and/or Residential Mobile Crisis Unit telephone number  Request made of family/significant other to:  Remove weapons (e.g., guns, rifles, knives), all items previously/currently identified as safety concern.    Remove drugs/medications (over-the-counter, prescriptions, illicit drugs), all items previously/currently identified as a safety concern.  The family member/significant other verbalizes understanding of the suicide prevention education information provided.  The family member/significant other agrees to remove the items of safety concern listed above. The patient did not endorse SI at the time of admission, nor did the patient c/o SI during the stay here.  SPE not required.  Spoke with niece, AmandaMcKinnen at [phone].  She stated he actively seeks drugs and this has been an on-going problem as he is not willing to change.  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  She is at her wits end, but understands that he has to be willing to change.  She and her husband had to kick him out because he was stealing from them to get money for  drugs.  She says she does not think he is getting them at the [facility] because they are gated, and they have a zero tolerence policy. Trish Mage 05/16/2016, 8:36 AM

## 2016-05-16 NOTE — BHH Group Notes (Signed)
[place] LCSW Group Therapy  05/16/2016 1:15 pm  Type of Therapy: Process Group Therapy  Participation Level:  Active  Participation Quality:  Appropriate  Affect:  Flat  Cognitive:  Oriented  Insight:  Improving  Engagement in Group:  Limited  Engagement in Therapy:  Limited  Modes of Intervention:  Activity, Clarification, Education, Problem-solving and Support  Summary of Progress/Problems: Today's group addressed the issue of overcoming obstacles.  Patients were asked to identify their biggest obstacle post d/c that stands in the way of their on-going success, and then problem solve as to how to manage this.  Stayed the entire time, engaged throughout.  Talked about having challenges with getting to appointments because his neices were busy with other things.  We talked about a back up plan.  Limited insight. Trish Mage 05/16/2016   2:31 PM

## 2016-05-16 NOTE — Tx Team (Signed)
Interdisciplinary Treatment and Diagnostic Plan Update  05/16/2016 Time of Session: 8:35 AM  Karter Hellmer MRN: [id number]  Principal Diagnosis: Schizoaffective disorder [facility])  Secondary Diagnoses: Principal Problem:   Schizoaffective disorder ([place])   Current Medications:  Current Facility-Administered Medications  Medication Dose Route Frequency Provider Last Rate Last Dose  . acetaminophen (TYLENOL) tablet 650 mg  650 mg Oral Q4H PRN Lurena Nida, NP   650 mg at 05/16/16 0816  . benztropine (COGENTIN) tablet 0.5 mg  0.5 mg Oral BID Lurena Nida, NP   0.5 mg at 05/16/16 0813  . FLUoxetine (PROZAC) capsule 20 mg  20 mg Oral Daily Lurena Nida, NP   20 mg at 05/16/16 0813  . haloperidol (HALDOL) tablet 5 mg  5 mg Oral BID Lurena Nida, NP   5 mg at 05/16/16 0813  . traZODone (DESYREL) tablet 100 mg  100 mg Oral QHS Lurena Nida, NP   100 mg at 05/15/16 2139    PTA Medications: Prescriptions Prior to Admission  Medication Sig Dispense Refill Last Dose  . albuterol (PROVENTIL) (2.5 MG/3ML) 0.083% nebulizer solution Take 3 mLs (2.5 mg total) by nebulization every 6 (six) hours as needed for wheezing or shortness of breath. 75 mL 12 unknown  . benztropine (COGENTIN) 0.5 MG tablet Take 1 tablet (0.5 mg total) by mouth 2 (two) times daily. For prevention of drug induced tremors 60 tablet 1 Past Week at Unknown time  . [employer] VITAMIN B12 1000 MCG tablet Take 1,000 mcg by mouth daily.  0 Past Week at Unknown time  . FLUoxetine (PROZAC) 20 MG capsule Take 1 capsule (20 mg total) by mouth daily. 60 capsule 1 Past Week at Unknown time  . folic acid (FOLVITE) 1 MG tablet Take 1 mg by mouth daily.  3 Past Week at Unknown time  . gabapentin (NEURONTIN) 300 MG capsule Take 1 capsule (300 mg total) by mouth 3 (three) times daily. 90 capsule 1 Past Week at Unknown time  . haloperidol (HALDOL) 5 MG tablet Take 1 tablet (5 mg total) by mouth 2 (two) times daily. For mood control 60 tablet 1 Past Week at  Unknown time  . HYDROcodone-acetaminophen (NORCO/VICODIN) 5-325 MG tablet Take 1-2 tablets by mouth every 4 (four) hours as needed for moderate pain. 30 tablet 0 Past Week at Unknown time  . Multiple Vitamin (MULTIVITAMIN WITH MINERALS) TABS tablet Take 1 tablet by mouth daily. May purchase over the counter: As a Vitamin supplement   Past Week at Unknown time  . naproxen (EC-NAPROSYN) 500 MG EC tablet Take 1 tablet (500 mg total) by mouth 2 (two) times daily with a meal. 60 tablet 2 Past Week at Unknown time  . nicotine (NICODERM CQ - DOSED IN MG/24 HOURS) 21 mg/24hr patch Place 1 patch (21 mg total) onto the skin daily. (Patient not taking: Reported on 03/09/2016) 28 patch 0 Not Taking  . phosphorus (K PHOS NEUTRAL) 155-852-130 MG tablet Take 1 tablet (250 mg total) by mouth 2 (two) times daily. (Patient not taking: Reported on 05/14/2016) 60 tablet 0 Not Taking at Unknown time  . prochlorperazine (COMPAZINE) 10 MG tablet Take 1 tablet (10 mg total) by mouth every 6 (six) hours as needed for nausea or vomiting. 30 tablet 0 unknown  . traZODone (DESYREL) 100 MG tablet Take 1 tablet (100 mg total) by mouth at bedtime. For insomnia 30 tablet 1 Past Week at Unknown time    Treatment Modalities: Medication Management, Group therapy, Case  management,  1 to 1 session with clinician, Psychoeducation, Recreational therapy.   Physician Treatment Plan for Primary Diagnosis: Schizoaffective disorder ([place]) Long Term Goal(s): Improvement in symptoms so as ready for discharge  Short Term Goals: Ability to identify changes in lifestyle to reduce recurrence of condition will improve Ability to demonstrate self-control will improve Ability to maintain clinical measurements within normal limits will improve Ability to identify changes in lifestyle to reduce recurrence of condition will improve Ability to identify and develop effective coping behaviors will improve Ability to maintain clinical measurements within  normal limits will improve Ability to identify triggers associated with substance abuse/mental health issues will improve  Medication Management: Evaluate patient's response, side effects, and tolerance of medication regimen.  Therapeutic Interventions: 1 to 1 sessions, Unit Group sessions and Medication administration.  Evaluation of Outcomes: Adequate for Discharge  Physician Treatment Plan for Secondary Diagnosis: Principal Problem:   Schizoaffective disorder ([place])   Long Term Goal(s): Improvement in symptoms so as ready for discharge  Short Term Goals: Ability to identify changes in lifestyle to reduce recurrence of condition will improve Ability to demonstrate self-control will improve Ability to maintain clinical measurements within normal limits will improve Ability to identify changes in lifestyle to reduce recurrence of condition will improve Ability to identify and develop effective coping behaviors will improve Ability to maintain clinical measurements within normal limits will improve Ability to identify triggers associated with substance abuse/mental health issues will improve  Medication Management: Evaluate patient's response, side effects, and tolerance of medication regimen.  Therapeutic Interventions: 1 to 1 sessions, Unit Group sessions and Medication administration.  Evaluation of Outcomes: Adequate for Discharge   RN Treatment Plan for Primary Diagnosis: Schizoaffective disorder ([place]) Long Term Goal(s): Knowledge of disease and therapeutic regimen to maintain health will improve  Short Term Goals: Ability to identify and develop effective coping behaviors will improve and Compliance with prescribed medications will improve  Medication Management: RN will administer medications as ordered by provider, will assess and evaluate patient's response and provide education to patient for prescribed medication. RN will report any adverse and/or side effects to  prescribing provider.  Therapeutic Interventions: 1 on 1 counseling sessions, Psychoeducation, Medication administration, Evaluate responses to treatment, Monitor vital signs and CBGs as ordered, Perform/monitor CIWA, COWS, AIMS and Fall Risk screenings as ordered, Perform wound care treatments as ordered.  Evaluation of Outcomes: Adequate for Discharge   LCSW Treatment Plan for Primary Diagnosis: Schizoaffective disorder [facility]) Long Term Goal(s): Safe transition to appropriate next level of care at discharge, Engage patient in therapeutic group addressing interpersonal concerns.  Short Term Goals: Engage patient in aftercare planning with referrals and resources  Therapeutic Interventions: Assess for all discharge needs, 1 to 1 time with Social worker, Explore available resources and support systems, Assess for adequacy in community support network, Educate family and significant other(s) on suicide prevention, Complete Psychosocial Assessment, Interpersonal group therapy.  Evaluation of Outcomes: Met  Return home to [facility], follow up [facility]-referred to TCT   Progress in Treatment: Attending groups: Yes Participating in groups: Yes Taking medication as prescribed: Yes Toleration medication: Yes, no side effects reported at this time Family/Significant other contact made: No Patient understands diagnosis: No  Limited insight.  But is asking to get back on meds Discussing patient identified problems/goals with staff: Yes Medical problems stabilized or resolved: Yes Denies suicidal/homicidal ideation: Yes Issues/concerns per patient self-inventory: None Other: N/A  New problem(s) identified: None identified at this time.   New Short Term/Long  Term Goal(s): None identified at this time.   Discharge Plan or Barriers:   Reason for Continuation of Hospitalization:  Medication stabilization   Estimated Length of Stay: [place] d/c Tuesday, Wed at the latest as pt has signed a  72 hr request  Attendees: Patient: 05/16/2016  8:35 AM  Physician: Ursula Alert, MD 05/16/2016  8:35 AM  Nursing: Hoy Register, RN 05/16/2016  8:35 AM  RN Care Manager: Lars Pinks, RN 05/16/2016  8:35 AM  Social Worker: Ripley Fraise 05/16/2016  8:35 AM  Recreational Therapist: Laretta Bolster  05/16/2016  8:35 AM  Other: Norberto Sorenson 05/16/2016  8:35 AM  Other:  05/16/2016  8:35 AM    Scribe for Treatment Team:  Roque Lias LCSW 05/16/2016 8:35 AM

## 2016-05-16 NOTE — Progress Notes (Signed)
D: Pt was flat, isolative and withdrawn to room; remained in bed for most of the evening. Pt at the time of assessment denied depression, anxiety, pain, SI, HI or AVH; states, "today is one of my best days; I think my medications are working." Pt remained oriented, pleasant and cooperative. A: Medications offered as prescribed.  Support, encouragement, and safe environment provided.  15-minute safety checks continue. R: Pt was med compliant.  Pt did not attend group. Safety checks continue.

## 2016-05-16 NOTE — Progress Notes (Signed)
[facility] MD Progress Note  05/16/2016 1:38 PM Abhay Godbolt  MRN:  [id number] Subjective:  Patient states " I am ok."  Objective:Patient seen and chart reviewed.Discussed patient with treatment team.  Pt today seen as calm, alert, oriented x3. Pt ruminates about his stressors, cancer diagnosis, having poor social support here in [place] and so on. Pt reports his medications are effective , denies ADRs, wants to continue the same. Denies no new concerns today. Encouraged to attend groups and participate.    Principal Problem: Schizoaffective disorder ([place]) Diagnosis:   Patient Active Problem List   Diagnosis Date Noted  . Anemia complicating neoplastic disease [D63.0] 12/07/2015  . Encounter for antineoplastic chemotherapy [Z51.11] 12/07/2015  . Swelling of right upper extremity [M79.89] 11/07/2015  . Skin metastases ([place]) [C79.2] 11/06/2015  . Pain of metastatic malignancy [G89.3] 11/06/2015  . Malignant neoplasm of right upper lobe of lung ([place]) [C34.11] 11/05/2015  . Hypophosphatemia [E83.39] 11/04/2015  . Hypercalcemia of malignancy [E83.52] 10/31/2015  . Lung mass [R91.8] 10/31/2015  . Postobstructive pneumonia [J18.9] 10/31/2015  . Hypercalcemia [E83.52] 10/31/2015  . Leukocytosis [D72.829] 10/31/2015  . Cocaine use [F14.10] 08/10/2015  . Cocaine-induced mood disorder ([place]) [F14.94] 08/10/2015  . Alcohol abuse with intoxication ([place]) [F10.129] 04/14/2015  . Cocaine abuse with cocaine-induced mood disorder ([place]) [F14.14] 04/14/2015  . Psychoses [F29]   . Schizoaffective disorder, bipolar type ([place]) [F25.0]   . Alcohol dependence ([place]) [F10.20] 06/08/2013  . Schizoaffective disorder ([place]) [F25.9] 08/08/2012  . Polysubstance (excluding opioids) dependence ([place]) [F19.20] 08/08/2012   Total Time spent with patient: 20 minutes  Past Psychiatric History: Please see H&P.   Past Medical History:  Past Medical History:  Diagnosis Date  . Anxiety   . Cancer ([place])    lung  .  Polysubstance abuse    cocaine, etoh, marijuana  . Psychosis   . Schizoaffective disorder [facility])     Past Surgical History:  Procedure Laterality Date  . HERNIA REPAIR     Family History:  Family History  Problem Relation Age of Onset  . Cancer Neg Hx   . Schizophrenia Neg Hx    Family Psychiatric  History: Please see H&P.  Social History:  History  Alcohol Use  . Yes    Comment: occasionally drinks 1 to 2 beers     History  Drug Use  . Types: Cocaine, Marijuana    Comment: THC about once per week and cocaine on occasion.     Social History   Social History  . Marital status: Single    Spouse name: N/A  . Number of children: N/A  . Years of education: N/A   Social History Main Topics  . Smoking status: Current Every Day Smoker    Packs/day: 0.50    Years: 4.00  . Smokeless tobacco: Never Used  . Alcohol use Yes     Comment: occasionally drinks 1 to 2 beers  . Drug use: Yes    Types: Cocaine, Marijuana     Comment: THC about once per week and cocaine on occasion.   . Sexual activity: Yes    Birth control/ protection: Condom   Other Topics Concern  . None   Social History Narrative  . None   Additional Social History:                         Sleep: Fair  Appetite:  Fair  Current Medications: Current Facility-Administered Medications  Medication Dose Route Frequency Provider Last  Rate Last Dose  . acetaminophen (TYLENOL) tablet 650 mg  650 mg Oral Q4H PRN Lurena Nida, NP   650 mg at 05/16/16 0816  . benztropine (COGENTIN) tablet 0.5 mg  0.5 mg Oral BID Lurena Nida, NP   0.5 mg at 05/16/16 0813  . FLUoxetine (PROZAC) capsule 20 mg  20 mg Oral Daily Lurena Nida, NP   20 mg at 05/16/16 0813  . haloperidol (HALDOL) tablet 5 mg  5 mg Oral BID Lurena Nida, NP   5 mg at 05/16/16 0813  . hydrOXYzine (ATARAX/VISTARIL) tablet 25 mg  25 mg Oral Q6H PRN Ursula Alert, MD      . traZODone (DESYREL) tablet 100 mg  100 mg Oral QHS Lurena Nida,  NP   100 mg at 05/15/16 2139    Lab Results: No results found for this or any previous visit (from the past 48 hour(s)).  Blood Alcohol level:  Lab Results  Component Value Date   ETH 145 (H) 05/14/2016   ETH <5 95/28/4132    Metabolic Disorder Labs: No results found for: HGBA1C, MPG No results found for: PROLACTIN No results found for: CHOL, TRIG, HDL, CHOLHDL, VLDL, LDLCALC  Physical Findings: AIMS: Facial and Oral Movements Muscles of Facial Expression: None, normal Lips and Perioral Area: None, normal Jaw: None, normal Tongue: None, normal,Extremity Movements Upper (arms, wrists, hands, fingers): None, normal Lower (legs, knees, ankles, toes): None, normal, Trunk Movements Neck, shoulders, hips: None, normal, Overall Severity Severity of abnormal movements (highest score from questions above): None, normal Incapacitation due to abnormal movements: None, normal Patient's awareness of abnormal movements (rate only patient's report): No Awareness, Dental Status Current problems with teeth and/or dentures?: No Does patient usually wear dentures?: No  CIWA:    COWS:     Musculoskeletal: Strength & Muscle Tone: within normal limits Gait & Station: normal Patient leans: N/A  Psychiatric Specialty Exam: Physical Exam  Nursing note and vitals reviewed.   Review of Systems  Psychiatric/Behavioral: Positive for depression. The patient is nervous/anxious.   All other systems reviewed and are negative.   Blood pressure 92/61, pulse (!) 101, temperature 98.7 F (37.1 C), temperature source Oral, resp. rate 18, height '6\' 1"'$  (1.854 m), weight 65.3 kg (144 lb), SpO2 100 %.Body mass index is 19 kg/m.  General Appearance: Fairly Groomed  Eye Contact:  Fair  Speech:  Normal Rate  Volume:  Normal  Mood:  Anxious  Affect:  Congruent  Thought Process:  Goal Directed and Descriptions of Associations: Intact  Orientation:  Full (Time, Place, and Person)  Thought Content:   Rumination  Suicidal Thoughts:  No  Homicidal Thoughts:  No  Memory:  Immediate;   Fair Recent;   Fair Remote;   Fair  Judgement:  Fair  Insight:  Fair  Psychomotor Activity:  Normal  Concentration:  Concentration: Fair and Attention Span: Fair  Recall:  [employer] of Knowledge:  Fair  Language:  Fair  Akathisia:  No  Handed:  Right  AIMS (if indicated):     Assets:  Communication Skills Desire for Improvement  ADL's:  Intact  Cognition:  WNL  Sleep:  Number of Hours: 5     Treatment Plan Summary:Patient seen as making progress , continue to monitor on the unit. Daily contact with patient to assess and evaluate symptoms and progress in treatment, Medication management and Plan plan below For schizoaffective do: Haldol 5 mg po bid for psychosis/mood sx. Cogentin 0.5  mg po bid for EPS.  For affective sx: Prozac 20 mg po daily.  For insomnia: Trazodone 100 mg po qhs.  Alcohol use do/cocaine use do: Substance abuse counseling.Will continue to explore and educate.  Labs reviewed - uds- pos for cocaine, BAL - >100 , will order tsh, lipid panel, hba1c, pl.  Will order EKG for qtc monitoring.  Will continue to monitor vitals ,medication compliance and treatment side effects while patient is here.   Will monitor for medical issues as well as call consult as needed.   CSW will continue working on disposition. Pt will benefit from CST/TCT on discharge.  Patient to participate in therapeutic milieu .      Kayven Aldaco, MD 05/16/2016, 1:38 PM

## 2016-05-17 DIAGNOSIS — F4321 Adjustment disorder with depressed mood: Secondary | ICD-10-CM | POA: Clinically undetermined

## 2016-05-17 LAB — LIPID PANEL
Cholesterol: 187 mg/dL (ref 0–200)
HDL: 47 mg/dL (ref >40)
LDL Cholesterol: 114 mg/dL — ABNORMAL HIGH (ref 0–99)
Total CHOL/HDL Ratio: 4 RATIO
Triglycerides: 129 mg/dL (ref <150)
VLDL: 26 mg/dL (ref 0–40)

## 2016-05-17 LAB — TSH: TSH: 1.414 u[IU]/mL (ref 0.350–4.500)

## 2016-05-17 MED ORDER — HYDROXYZINE HCL 25 MG PO TABS: 25.0000 mg | ORAL_TABLET | Freq: Four times a day (QID) | ORAL | 0 refills | Status: DC | PRN

## 2016-05-17 MED ORDER — FLUOXETINE HCL 20 MG PO CAPS: 20.0000 mg | ORAL_CAPSULE | Freq: Every day | ORAL | 0 refills | Status: AC

## 2016-05-17 MED ORDER — TRAZODONE HCL 100 MG PO TABS: 100.0000 mg | ORAL_TABLET | Freq: Every day | ORAL | 0 refills | Status: AC

## 2016-05-17 MED ORDER — BENZTROPINE MESYLATE 0.5 MG PO TABS: 0.5000 mg | ORAL_TABLET | Freq: Two times a day (BID) | ORAL | 0 refills | Status: AC

## 2016-05-17 MED ORDER — HALOPERIDOL 5 MG PO TABS: 5.0000 mg | ORAL_TABLET | Freq: Two times a day (BID) | ORAL | 0 refills | Status: DC

## 2016-05-17 NOTE — Progress Notes (Signed)
Recreation Therapy Notes  Date: 05/17/16 Time: 1000 Location: [address] Dayroom  Group Topic: Coping Skills  Goal Area(s) Addresses:  Patients will be able to identify positive coping skills. Patients will be able to identify benefits of using coping skills post d/c.  Behavioral Response: Minimal  Intervention: Worksheet, pencils, dry erase marker, dry erase board  Activity: Mindmap.  Patients were given a worksheet of a a blank mind map.  As a group, patients filled in the first 8 boxes with triggers that require coping skills.  Patients were to then come up with 3 coping skills for each trigger identified by the group.  Once patients identified their coping skills, the group would fill in the remainder of the map on the board.  Education: Radiographer, therapeutic, Dentist.   Education Outcome: Acknowledges understanding/In group clarification offered/Needs additional education.   Clinical Observations/Feedback: Pt stated bad coping skills can put you in bad situations.  Pt identified some of his coping skills as getting away and taking medications.    Victorino Sparrow, LRT/CTRS         Victorino Sparrow A 05/17/2016 12:13 PM

## 2016-05-17 NOTE — [facility] Group Notes (Signed)
[place] LCSW Group Therapy  05/17/2016 3:01 PM   Type of Therapy:  Group Therapy  Participation Level:  Active  Participation Quality:  Attentive  Affect:  Appropriate  Cognitive:  Appropriate  Insight:  Improving  Engagement in Therapy:  Engaged  Modes of Intervention:  Clarification, Education, Exploration and Socialization  Summary of Progress/Problems: Today's group focused on resilience.  Stayed the entire time, engaged throughout.   "In every situation, I believe I will be OK, and it has always worked.  I learned this from some guys I used to play sports with.  I also read my Bible a lot." [place] B 05/17/2016 , 3:01 PM

## 2016-05-17 NOTE — Progress Notes (Signed)
Recreation Therapy Notes  Animal-Assisted Activity (AAA) Program Checklist/Progress Notes Patient Eligibility Criteria Checklist & Daily Group note for Rec Tx Intervention  Date: 02.20.2018 Time: 2:45pm Location: [address]    AAA/T Program Assumption of Risk Form signed by Patient/ or Parent Legal Guardian Yes  Patient is free of allergies or sever asthma Yes  Patient reports no fear of animals Yes  Patient reports no history of cruelty to animals Yes  Patient understands his/her participation is voluntary Yes  Patient washes hands before animal contact Yes  Patient washes hands after animal contact Yes  Behavioral Response: Appropriate   Education: Hand Washing, Appropriate Animal Interaction   Education Outcome: Acknowledges education.   Clinical Observations/Feedback: Patient discussed with MD for appropriateness in pet therapy session. Both LRT and MD agree patient is appropriate for participation. Patient offered participation in session and signed necessary consent form without issue. Patient attended group session for its entirety, engaged appropriately with peers and therapy dog and shared stories about his pets at home with group.   [name] Keith Murray, LRT/CTRS       Keith Murray 05/17/2016 3:07 PM

## 2016-05-17 NOTE — Progress Notes (Signed)
Keith Murray had been up and visible in milieu this evening, attended and participated in evening group activity. Keith Murray spoke about how he was at the [employer] and spoke about how he hadn't had his medications and spoke about a death in the family as to the reason and spoke about how someone come to see him at the motel and suggested that he come to the hospital so he could get back on his medications. Keith Murray has been pleasant and interacting appropriately with peers. He received all bedtime medications without incident this evening. A. Support and encouragement provided. R. Safety maintained, will continue to monitor.

## 2016-05-17 NOTE — Discharge Summary (Signed)
Physician Discharge Summary Note  Patient:  Keith Murray is an 56 y.o., male MRN:  [id number] DOB:  Mar 26, 1961 Patient phone:  [phone] (home)  Patient address:   [address] [place] [address],  Total Time spent with patient: 30 minutes  Date of Admission:  05/14/2016 Date of Discharge: 05/20/2016  Reason for Admission:  Wanted to hurt people  Principal Problem: Schizoaffective disorder [facility]) Discharge Diagnoses: Patient Active Problem List   Diagnosis Date Noted  . Grief [F43.20] 05/17/2016  . Anemia complicating neoplastic disease [D63.0] 12/07/2015  . Encounter for antineoplastic chemotherapy [Z51.11] 12/07/2015  . Swelling of right upper extremity [M79.89] 11/07/2015  . Skin metastases ([place]) [C79.2] 11/06/2015  . Pain of metastatic malignancy [G89.3] 11/06/2015  . Malignant neoplasm of right upper lobe of lung ([place]) [C34.11] 11/05/2015  . Hypophosphatemia [E83.39] 11/04/2015  . Hypercalcemia of malignancy [E83.52] 10/31/2015  . Lung mass [R91.8] 10/31/2015  . Postobstructive pneumonia [J18.9] 10/31/2015  . Hypercalcemia [E83.52] 10/31/2015  . Leukocytosis [D72.829] 10/31/2015  . Cocaine use [F14.10] 08/10/2015  . Cocaine-induced mood disorder ([place]) [F14.94] 08/10/2015  . Alcohol abuse with intoxication ([place]) [F10.129] 04/14/2015  . Cocaine abuse with cocaine-induced mood disorder ([place]) [F14.14] 04/14/2015  . Psychoses [F29]   . Schizoaffective disorder, bipolar type ([place]) [F25.0]   . Alcohol dependence ([place]) [F10.20] 06/08/2013  . Schizoaffective disorder ([place]) [F25.9] 08/08/2012  . Polysubstance (excluding opioids) dependence ([place]) [F19.20] 08/08/2012    Past Psychiatric History:  See HPI  Past Medical History:  Past Medical History:  Diagnosis Date  . Anxiety   . Cancer ([place])    lung  . Polysubstance abuse    cocaine, etoh, marijuana  . Psychosis   . Schizoaffective disorder [facility])     Past Surgical History:  Procedure Laterality Date  .  HERNIA REPAIR     Family History:  Family History  Problem Relation Age of Onset  . Cancer Neg Hx   . Schizophrenia Neg Hx    Family Psychiatric  History: see HPI Social History:  History  Alcohol Use  . Yes    Comment: occasionally drinks 1 to 2 beers     History  Drug Use  . Types: Cocaine, Marijuana    Comment: THC about once per week and cocaine on occasion.     Social History   Social History  . Marital status: Single    Spouse name: N/A  . Number of children: N/A  . Years of education: N/A   Social History Main Topics  . Smoking status: Current Every Day Smoker    Packs/day: 0.50    Years: 4.00  . Smokeless tobacco: Never Used  . Alcohol use Yes     Comment: occasionally drinks 1 to 2 beers  . Drug use: Yes    Types: Cocaine, Marijuana     Comment: THC about once per week and cocaine on occasion.   . Sexual activity: Yes    Birth control/ protection: Condom   Other Topics Concern  . None   Social History Narrative  . None    Hospital Course:    Keith Murray an 56 y.o.male, who presented voluntarily and unaccompanied to [place] Murray an 56 y.o.male, who presented voluntarily and unaccompanied to [facility].  Patient had wanted to hurt people and was experiencing hallucinations.    Keith Murray was admitted for Schizoaffective disorder [facility]) and crisis management.  Patient was treated with medications with their indications listed below in detail under Medication List.  Medical problems  were identified and treated as needed.  Home medications were restarted as appropriate.  Improvement was monitored by observation and Keith Murray daily report of symptom reduction.  Emotional and mental status was monitored by daily self inventory reports completed by Keith Murray and clinical staff.  Patient reported continued improvement, denied any new concerns.  Patient had been compliant on medications and denied side effects.  Support and encouragement was provided.     Keith Murray was evaluated by the treatment team for stability and plans for continued recovery upon discharge.  Patient was offered further treatment options upon discharge including Residential, Intensive Outpatient and Outpatient treatment. Patient will follow up with agency listed below for medication management and counseling.  Encouraged patient to maintain satisfactory support network and home environment.  Advised to adhere to medication compliance and outpatient treatment follow up.  Prescriptions provided.       Keith Murray motivation was an integral factor for scheduling further treatment.  Employment, transportation, bed availability, health status, family support, and any pending legal issues were also considered during patient's hospital stay.  Upon completion of this admission the patient was both mentally and medically stable for discharge denying suicidal/homicidal ideation, auditory/visual/tactile hallucinations, delusional thoughts and paranoia.      Physical Findings: AIMS: Facial and Oral Movements Muscles of Facial Expression: None, normal Lips and Perioral Area: None, normal Jaw: None, normal Tongue: None, normal,Extremity Movements Upper (arms, wrists, hands, fingers): None, normal Lower (legs, knees, ankles, toes): None, normal, Trunk Movements Neck, shoulders, hips: None, normal, Overall Severity Severity of abnormal movements (highest score from questions above): None, normal Incapacitation due to abnormal movements: None, normal Patient's awareness of abnormal movements (rate only patient's report): No Awareness, Dental Status Current problems with teeth and/or dentures?: No Does patient usually wear dentures?: No  CIWA:    COWS:     Musculoskeletal: Strength & Muscle Tone: within normal limits Gait & Station: normal Patient leans: N/A  Psychiatric Specialty Exam: Physical Exam  ROS  Blood pressure 101/81, pulse (!) 105, temperature 98.9 F (37.2 C),  temperature source Oral, resp. rate 18, height '6\' 1"'$  (1.854 m), weight 65.3 kg (144 lb), SpO2 100 %.Body mass index is 19 kg/m.    Have you used any form of tobacco in the last 30 days? (Cigarettes, Smokeless Tobacco, Cigars, and/or Pipes): Yes  Has this patient used any form of tobacco in the last 30 days? (Cigarettes, Smokeless Tobacco, Cigars, and/or Pipes) Yes, N/A  Blood Alcohol level:  Lab Results  Component Value Date   ETH 145 (H) 05/14/2016   ETH <5 60/12/9321    Metabolic Disorder Labs:  No results found for: HGBA1C, MPG No results found for: PROLACTIN Lab Results  Component Value Date   CHOL 187 05/17/2016   TRIG 129 05/17/2016   HDL 47 05/17/2016   CHOLHDL 4.0 05/17/2016   VLDL 26 05/17/2016   LDLCALC 114 (H) 05/17/2016    See Psychiatric Specialty Exam and Suicide Risk Assessment completed by Attending Physician prior to discharge.  Discharge destination:  Home  Is patient on multiple antipsychotic therapies at discharge:  No   Has Patient had three or more failed trials of antipsychotic monotherapy by history:  No  Recommended Plan for Multiple Antipsychotic Therapies: NA   Allergies as of 05/17/2016   No Known Allergies     Medication List    STOP taking these medications   albuterol (2.5 MG/3ML) 0.083% nebulizer solution Commonly known as:  PROVENTIL   [employer] VITAMIN  B12 1000 MCG tablet Generic drug:  cyanocobalamin   folic acid 1 MG tablet Commonly known as:  FOLVITE   gabapentin 300 MG capsule Commonly known as:  NEURONTIN   HYDROcodone-acetaminophen 5-325 MG tablet Commonly known as:  NORCO/VICODIN   multivitamin with minerals Tabs tablet   naproxen 500 MG EC tablet Commonly known as:  EC-NAPROSYN   nicotine 21 mg/24hr patch Commonly known as:  NICODERM CQ - dosed in mg/24 hours   phosphorus 155-852-130 MG tablet Commonly known as:  K PHOS NEUTRAL   prochlorperazine 10 MG tablet Commonly known as:  COMPAZINE     TAKE these  medications     Indication  benztropine 0.5 MG tablet Commonly known as:  COGENTIN Take 1 tablet (0.5 mg total) by mouth 2 (two) times daily. What changed:  additional instructions  Indication:  Extrapyramidal Reaction caused by Medications   FLUoxetine 20 MG capsule Commonly known as:  PROZAC Take 1 capsule (20 mg total) by mouth daily. Start taking on:  05/18/2016  Indication:  Major Depressive Disorder   haloperidol 5 MG tablet Commonly known as:  HALDOL Take 1 tablet (5 mg total) by mouth 2 (two) times daily. What changed:  additional instructions  Indication:  Psychosis, mood stabilization   hydrOXYzine 25 MG tablet Commonly known as:  ATARAX/VISTARIL Take 1 tablet (25 mg total) by mouth every 6 (six) hours as needed for anxiety.  Indication:  Anxiety Neurosis   traZODone 100 MG tablet Commonly known as:  DESYREL Take 1 tablet (100 mg total) by mouth at bedtime. What changed:  additional instructions  Indication:  Trouble Sleeping        Follow-up recommendations:  Activity:  as tol Diet:  as tol  Comments:  1.  Take all your medications as prescribed.   2.  Report any adverse side effects to outpatient provider. 3.  Patient instructed to not use alcohol or illegal drugs while on prescription medicines. 4.  In the event of worsening symptoms, instructed patient to call 911, the crisis hotline or go to nearest emergency room for evaluation of symptoms.  Signed: Janett Labella, NP [facility] 05/17/2016, 3:27 PM

## 2016-05-17 NOTE — Progress Notes (Addendum)
[facility] MD Progress Note  05/17/2016 12:43 PM Yassin Scales  MRN:  [id number] Subjective:  Patient states " my grand mother passed away and they did not even inform me.'   Objective:Patient seen and chart reviewed.Discussed patient with treatment team.  Pt today seen as calm, alert , reports some anxiety and sadness about his grandmother who passed away. Pt offered spiritual consult . Pt is coping well. Pt tolerating medications well, denies ADRs. Continue to encourage and support.      Principal Problem: Schizoaffective disorder ([place]) Diagnosis:   Patient Active Problem List   Diagnosis Date Noted  . Grief [F43.20] 05/17/2016  . Anemia complicating neoplastic disease [D63.0] 12/07/2015  . Encounter for antineoplastic chemotherapy [Z51.11] 12/07/2015  . Swelling of right upper extremity [M79.89] 11/07/2015  . Skin metastases ([place]) [C79.2] 11/06/2015  . Pain of metastatic malignancy [G89.3] 11/06/2015  . Malignant neoplasm of right upper lobe of lung ([place]) [C34.11] 11/05/2015  . Hypophosphatemia [E83.39] 11/04/2015  . Hypercalcemia of malignancy [E83.52] 10/31/2015  . Lung mass [R91.8] 10/31/2015  . Postobstructive pneumonia [J18.9] 10/31/2015  . Hypercalcemia [E83.52] 10/31/2015  . Leukocytosis [D72.829] 10/31/2015  . Cocaine use [F14.10] 08/10/2015  . Cocaine-induced mood disorder ([place]) [F14.94] 08/10/2015  . Alcohol abuse with intoxication ([place]) [F10.129] 04/14/2015  . Cocaine abuse with cocaine-induced mood disorder ([place]) [F14.14] 04/14/2015  . Psychoses [F29]   . Schizoaffective disorder, bipolar type ([place]) [F25.0]   . Alcohol dependence ([place]) [F10.20] 06/08/2013  . Schizoaffective disorder ([place]) [F25.9] 08/08/2012  . Polysubstance (excluding opioids) dependence ([place]) [F19.20] 08/08/2012   Total Time spent with patient: 15 minutes  Past Psychiatric History: Please see H&P.   Past Medical History:  Past Medical History:  Diagnosis Date  . Anxiety   . Cancer ([place])     lung  . Polysubstance abuse    cocaine, etoh, marijuana  . Psychosis   . Schizoaffective disorder [facility])     Past Surgical History:  Procedure Laterality Date  . HERNIA REPAIR     Family History:  Family History  Problem Relation Age of Onset  . Cancer Neg Hx   . Schizophrenia Neg Hx    Family Psychiatric  History: Please see H&P.  Social History:  History  Alcohol Use  . Yes    Comment: occasionally drinks 1 to 2 beers     History  Drug Use  . Types: Cocaine, Marijuana    Comment: THC about once per week and cocaine on occasion.     Social History   Social History  . Marital status: Single    Spouse name: N/A  . Number of children: N/A  . Years of education: N/A   Social History Main Topics  . Smoking status: Current Every Day Smoker    Packs/day: 0.50    Years: 4.00  . Smokeless tobacco: Never Used  . Alcohol use Yes     Comment: occasionally drinks 1 to 2 beers  . Drug use: Yes    Types: Cocaine, Marijuana     Comment: THC about once per week and cocaine on occasion.   . Sexual activity: Yes    Birth control/ protection: Condom   Other Topics Concern  . None   Social History Narrative  . None   Additional Social History:                         Sleep: Fair  Appetite:  Fair  Current Medications: Current Facility-Administered Medications  Medication  Dose Route Frequency Provider Last Rate Last Dose  . acetaminophen (TYLENOL) tablet 650 mg  650 mg Oral Q4H PRN Lurena Nida, NP   650 mg at 05/17/16 8185  . benztropine (COGENTIN) tablet 0.5 mg  0.5 mg Oral BID Lurena Nida, NP   0.5 mg at 05/17/16 0736  . FLUoxetine (PROZAC) capsule 20 mg  20 mg Oral Daily Lurena Nida, NP   20 mg at 05/17/16 6314  . haloperidol (HALDOL) tablet 5 mg  5 mg Oral BID Lurena Nida, NP   5 mg at 05/17/16 9702  . hydrOXYzine (ATARAX/VISTARIL) tablet 25 mg  25 mg Oral Q6H PRN Ursula Alert, MD      . traZODone (DESYREL) tablet 100 mg  100 mg Oral QHS Lurena Nida, NP   100 mg at 05/16/16 2109    Lab Results:  Results for orders placed or performed during the hospital encounter of 05/14/16 (from the past 48 hour(s))  TSH     Status: None   Collection Time: 05/17/16  6:19 AM  Result Value Ref Range   TSH 1.414 0.350 - 4.500 uIU/mL    Comment: Performed by a 3rd Generation assay with a functional sensitivity of <=0.01 uIU/mL. Performed at [facility], [place] [address] [place], [place] [address]   Lipid panel     Status: Abnormal   Collection Time: 05/17/16  6:19 AM  Result Value Ref Range   Cholesterol 187 0 - 200 mg/dL   Triglycerides 129 <150 mg/dL   HDL 47 >40 mg/dL   Total CHOL/HDL Ratio 4.0 RATIO   VLDL 26 0 - 40 mg/dL   LDL Cholesterol 114 (H) 0 - 99 mg/dL    Comment:        Total Cholesterol/HDL:CHD Risk Coronary Heart Disease Risk Table                     Men   Women  1/2 Average Risk   3.4   3.3  Average Risk       5.0   4.4  2 X Average Risk   9.6   7.1  3 X Average Risk  23.4   11.0        Use the calculated Patient Ratio above and the CHD Risk Table to determine the patient's CHD Risk.        ATP III CLASSIFICATION (LDL):  <100     mg/dL   Optimal  100-129  mg/dL   Near or Above                    Optimal  130-159  mg/dL   Borderline  160-189  mg/dL   High  >190     mg/dL   Very High Performed at [facility] [address], [place], [place] [address]     Blood Alcohol level:  Lab Results  Component Value Date   ETH 145 (H) 05/14/2016   ETH <5 77/41/2878    Metabolic Disorder Labs: No results found for: HGBA1C, MPG No results found for: PROLACTIN Lab Results  Component Value Date   CHOL 187 05/17/2016   TRIG 129 05/17/2016   HDL 47 05/17/2016   CHOLHDL 4.0 05/17/2016   VLDL 26 05/17/2016   LDLCALC 114 (H) 05/17/2016    Physical Findings: AIMS: Facial and Oral Movements Muscles of Facial Expression: None, normal Lips and Perioral Area: None, normal Jaw: None,  normal Tongue:  None, normal,Extremity Movements Upper (arms, wrists, hands, fingers): None, normal Lower (legs, knees, ankles, toes): None, normal, Trunk Movements Neck, shoulders, hips: None, normal, Overall Severity Severity of abnormal movements (highest score from questions above): None, normal Incapacitation due to abnormal movements: None, normal Patient's awareness of abnormal movements (rate only patient's report): No Awareness, Dental Status Current problems with teeth and/or dentures?: No Does patient usually wear dentures?: No  CIWA:    COWS:     Musculoskeletal: Strength & Muscle Tone: within normal limits Gait & Station: normal Patient leans: N/A  Psychiatric Specialty Exam: Physical Exam  Nursing note and vitals reviewed.   Review of Systems  Psychiatric/Behavioral: Positive for depression.  All other systems reviewed and are negative.   Blood pressure 101/81, pulse (!) 105, temperature 98.9 F (37.2 C), temperature source Oral, resp. rate 18, height '6\' 1"'$  (1.854 m), weight 65.3 kg (144 lb), SpO2 100 %.Body mass index is 19 kg/m.  General Appearance: Fairly Groomed  Eye Contact:  Fair  Speech:  Normal Rate  Volume:  Normal  Mood:  Anxious and sad about his grandma  Affect:  Congruent  Thought Process:  Goal Directed and Descriptions of Associations: Intact  Orientation:  Full (Time, Place, and Person)  Thought Content:  Rumination  Suicidal Thoughts:  No  Homicidal Thoughts:  No  Memory:  Immediate;   Fair Recent;   Fair Remote;   Fair  Judgement:  Fair  Insight:  Fair  Psychomotor Activity:  Normal  Concentration:  Concentration: Fair and Attention Span: Fair  Recall:  [employer] of Knowledge:  Fair  Language:  Fair  Akathisia:  No  Handed:  Right  AIMS (if indicated):     Assets:  Communication Skills Desire for Improvement  ADL's:  Intact  Cognition:  WNL  Sleep:  Number of Hours: 6.75     Treatment Plan Summary:Patient today ruminates  about his grand mother who passed away , however is coping fair , will continue to support and treat. Schizoaffective disorder ([place]) improving  Will continue today 05/17/16 plan as below except where it is noted.   Daily contact with patient to assess and evaluate symptoms and progress in treatment, Medication management and Plan plan as below For schizoaffective CN:[id number] - no changes made. Haldol 5 mg po bid for psychosis/mood sx. Cogentin 0.5 mg po bid for EPS.  For affective sx: Prozac 20 mg po daily.  For insomnia: Trazodone 100 mg po qhs.  Alcohol use do/cocaine use do: Substance abuse counseling.Will continue to explore and educate.  Grief: spiritual consult placed.  Labs reviewed- tsh - wnl, lipid panel- wnl, pending hba1c, pl.  EKG reviewed - qtc - wnl.  Will continue to monitor vitals ,medication compliance and treatment side effects while patient is here.   Will monitor for medical issues as well as call consult as needed.   CSW will continue working on disposition. Pt will benefit from CST/TCT on discharge.  Patient to participate in therapeutic milieu .      Jaxtin Raimondo, MD 05/17/2016, 12:43 PM

## 2016-05-17 NOTE — Progress Notes (Signed)
Patient denies SI, HI and AVH.  Patient has been noted with a bright affect.  Patient has attended groups and been compliant with medications.  Patient has had no issues of behavioral dyscontrol.   Assess patient for safety, offer medications as prescribed, engage patient in 1:1 staff talk, praise patient for effective use of coping skills.   Patient able to contract for safety.  Continue to monitor as prescribed.

## 2016-05-18 ENCOUNTER — Other Ambulatory Visit: Payer: Self-pay

## 2016-05-18 DIAGNOSIS — F251 Schizoaffective disorder, depressive type: Secondary | ICD-10-CM

## 2016-05-18 LAB — HEMOGLOBIN A1C
Hgb A1c MFr Bld: 5.1 % (ref 4.8–5.6)
Mean Plasma Glucose: 100

## 2016-05-18 LAB — PROLACTIN: Prolactin: 62.8 ng/mL — ABNORMAL HIGH (ref 4.0–15.2)

## 2016-05-18 NOTE — Progress Notes (Signed)
  [facility] Adult Case Management Discharge Plan :  Will you be returning to the same living situation after discharge:  Yes,  home At discharge, do you have transportation home?: Yes,  bus pass Do you have the ability to pay for your medications: Yes,  mental health  Release of information consent forms completed and in the chart;  Patient's signature needed at discharge.  Patient to Follow up at: Follow-up Information    [facility] Follow up.   Specialty:  Behavioral Health Why:  Contact Reggie King at [phone] to get a hospital follow up appointment here, and for help with transportation. Contact information: [address] [place] [address] [phone]           Next level of care provider has access to [place] and Suicide Prevention discussed: Yes,  yes  Have you used any form of tobacco in the last 30 days? (Cigarettes, Smokeless Tobacco, Cigars, and/or Pipes): Yes  Has patient been referred to the Quitline?: Patient refused referral  Patient has been referred for addiction treatment: Pt. refused referral  Trish Mage 05/18/2016, 9:56 AM

## 2016-05-18 NOTE — Progress Notes (Signed)
Adult Psychoeducational Group Note  Date:  05/18/2016 Time:  12:07 AM  Group Topic/Focus:  Wrap-Up Group:   The focus of this group is to help patients review their daily goal of treatment and discuss progress on daily workbooks.  Participation Level:  Active  Participation Quality:  Appropriate  Affect:  Appropriate  Cognitive:  Alert  Insight: Appropriate  Engagement in Group:  Engaged  Modes of Intervention:  Discussion  Additional Comments:  Patient states, "I had a good day". Patient's goal for today was to go home. Patient states he will be discharging tomorrow.   Ludy Messamore L Keirstin Musil 05/18/2016, 12:07 AM

## 2016-05-18 NOTE — BHH Suicide Risk Assessment (Signed)
[facility] Discharge Suicide Risk Assessment   Principal Problem: Schizoaffective disorder [facility]) Discharge Diagnoses:  Patient Active Problem List   Diagnosis Date Noted  . Grief [F43.20] 05/17/2016  . Anemia complicating neoplastic disease [D63.0] 12/07/2015  . Encounter for antineoplastic chemotherapy [Z51.11] 12/07/2015  . Swelling of right upper extremity [M79.89] 11/07/2015  . Skin metastases ([place]) [C79.2] 11/06/2015  . Pain of metastatic malignancy [G89.3] 11/06/2015  . Malignant neoplasm of right upper lobe of lung ([place]) [C34.11] 11/05/2015  . Hypophosphatemia [E83.39] 11/04/2015  . Hypercalcemia of malignancy [E83.52] 10/31/2015  . Lung mass [R91.8] 10/31/2015  . Postobstructive pneumonia [J18.9] 10/31/2015  . Hypercalcemia [E83.52] 10/31/2015  . Leukocytosis [D72.829] 10/31/2015  . Cocaine use [F14.10] 08/10/2015  . Cocaine-induced mood disorder ([place]) [F14.94] 08/10/2015  . Alcohol abuse with intoxication ([place]) [F10.129] 04/14/2015  . Cocaine abuse with cocaine-induced mood disorder ([place]) [F14.14] 04/14/2015  . Psychoses [F29]   . Schizoaffective disorder, bipolar type ([place]) [F25.0]   . Alcohol dependence ([place]) [F10.20] 06/08/2013  . Schizoaffective disorder ([place]) [F25.9] 08/08/2012  . Polysubstance (excluding opioids) dependence ([place]) [F19.20] 08/08/2012    Total Time spent with patient: 30 minutes  Musculoskeletal: Strength & Muscle Tone: within normal limits Gait & Station: normal Patient leans: N/A  Psychiatric Specialty Exam: Review of Systems  Psychiatric/Behavioral: Negative for depression and suicidal ideas.  All other systems reviewed and are negative.   Blood pressure 99/64, pulse 99, temperature 98.6 F (37 C), resp. rate 16, height '6\' 1"'$  (1.854 m), weight 65.3 kg (144 lb), SpO2 100 %.Body mass index is 19 kg/m.  General Appearance: Guarded  Eye Contact::  Fair  Speech:  Clear and Coherent409  Volume:  Normal  Mood:  Euthymic  Affect:  Congruent   Thought Process:  Goal Directed and Descriptions of Associations: Intact  Orientation:  Full (Time, Place, and Person)  Thought Content:  Logical  Suicidal Thoughts:  No  Homicidal Thoughts:  No  Memory:  Immediate;   Fair Recent;   Fair Remote;   Fair  Judgement:  Fair  Insight:  Fair  Psychomotor Activity:  Normal  Concentration:  Fair  Recall:  [employer] of Knowledge:Fair  Language: Fair  Akathisia:  No  Handed:  Right  AIMS (if indicated):   0  Assets:  Communication Skills Desire for Improvement  Sleep:  Number of Hours: 6  Cognition: WNL  ADL's:  Intact   Mental Status Per Nursing Assessment::   On Admission:     Demographic Factors:  Male  Loss Factors: NA  Historical Factors: Impulsivity  Risk Reduction Factors:   Positive social support and Positive therapeutic relationship  Continued Clinical Symptoms:  Previous Psychiatric Diagnoses and Treatments Medical Diagnoses and Treatments/Surgeries  Cognitive Features That Contribute To Risk:  None    Suicide Risk:  Minimal: No identifiable suicidal ideation.  Patients presenting with no risk factors but with morbid ruminations; may be classified as minimal risk based on the severity of the depressive symptoms  Follow-up Information    [facility] Follow up.   Specialty:  Behavioral Health Why:  Contact Reggie King at [phone] to get a hospital follow up appointment here, and for help with transportation. Contact information: [place] [address] [phone]           Plan Of Care/Follow-up recommendations:  Activity:  no restrictions Diet:  regular Tests:  follow up on your prolactin level Other:  none  Oneal Biglow, MD 05/18/2016, 8:53 AM

## 2016-05-18 NOTE — Progress Notes (Signed)
Keith Murray had been up and visible in milieu this evening, attended and participated in evening group activity. Keith Murray has been seen smiling and interacting appropriately with peers in milieu. Keith Murray spoke about discharge in the morning and spoke about how he wants to go back to where he was as he likes living where he is and does not want to move to [place]. Keith Murray did receive bedtime medication without incident this evening. A. Support and encouragement provided. R. Safety maintained, will continue to monitor.

## 2016-05-18 NOTE — Progress Notes (Signed)
Nursing Discharge Note 05/18/2016 [id number]  Data Reports sleeping good without PRN sleep med.  Rates depression 0/10, hopelessness 0/10, and anxiety 0/10. Affect bright and appropriate.  Denies HI, SI, AVH.  Pleasant, appropriate, states he is ready to discharge.  Received discharge orders.  Action Spoke with patient 1:1, nurse offered support to patient throughout shift.  Reviewed medications, discharge instructions, and follow up appointments with patient. Medication samples and scripts reviewed and given to patient.  Paperwork, AVS, SRA, and transition record handed to patient.   Escorted off of unit at 0920. Belongings returned per belongings form.  Discharged to lobby- patient states he will walk to his appointment at the cancer center at [facility].  Given a bus pass.    Response Verbalized understanding of discharge teaching. Agrees to contact someone or 911 with thoughts/intent to harm self or others.    To follow up per AVS.

## 2016-05-19 NOTE — BHH Counselor (Signed)
Adult Comprehensive Assessment  Patient ID: Keith Murray, male   DOB: 05-Jul-1960, 56 y.o.   MRN: [id number]   Information Source: Information source: Patient  Current Stressors:  Employment / Job issues: Disability, and works odd jobs Family Relationships: Niece with who he was living kicked him out several months ago.  He states she did it out of spite.  She states he was stealing from them to get drug money Financial / Lack of resources (include bankruptcy): Fixed income Physical health (include injuries & life threatening diseases): In process of treatment for lung cancer Substance abuse: Denies  Positive for cocaine and THC, niece states he is fixated on getting driugs, primarily cocaine    Living/Environment/Situation:  Living Arrangements: Alone Living conditions (as described by patient or guardian): "Iyt's real good"  Stays at [facility].  How long has patient lived in current situation?: several months What is atmosphere in current home: Supportive;;Comfortable Pharmacist, hospital)  Family History:  Marital status: Single but "I have a friend that is male in church."  Does patient have children?: Yes- One 64 yo daughter. "She lives in [place]. I have a good relationship with her and her mother."   Childhood History:  By whom was/is the patient raised?: Both parents Description of patient's relationship with caregiver when they were a child: Great Patient's description of current relationship with people who raised him/her: Deceased Does patient have siblings?: Yes Number of Siblings: 14 (Twin brother killed, 95 sisters (2 deceased)) Description of patient's current relationship with siblings: Twin brother was killed on a motorcycle when he was 16. Had 7 sisters, 2 of whom are deceased. Fair relationship with remaining sisters. Did patient suffer any verbal/emotional/physical/sexual abuse as a child?: No Did patient suffer from severe childhood neglect?: No Has patient ever  been sexually abused/assaulted/raped as an adolescent or adult?: No Was the patient ever a victim of a crime or a disaster?: No Witnessed domestic violence?: Yes Has patient been effected by domestic violence as an adult?: No Description of domestic violence: outside of the family  Education:  Highest grade of school patient has completed: 12 (class of 53) Currently a student?: No Learning disability?: No  Employment/Work Situation:  Employment situation: On disability and works Architect.  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  Does patient have a representative payee or guardian?: Yes Name of representative payee or guardian: Niece Keith Murray is representative payee  Alcohol/Substance Abuse:  What has been your use of drugs/alcohol within the last 12 months?: denies substance abuse-positive for  cocaine and THC. If attempted suicide, did drugs/alcohol play a role in this?: No Alcohol/Substance Abuse Treatment Hx  None. [place] denies this is an issue   If yes, describe treatment: Has alcohol/substance abuse ever caused legal problems?: Dui in [place] years ago. 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:  Leisure and Hobbies: Swim, play sports, fish, cut grass, wash cars, cleaning, mopping, sweeping,  dishes  Strengths/Needs:  What things does the patient do well?: Swim, good at sports, fish, cut grass, wash cars, cleaning, mopping, sweeping, dishes In what areas does patient struggle / problems for patient: Getting along with the nieces  Discharge Plan:  Does patient have access to transportation?: Yes Will patient be returning to same living situation after discharge?: yes Currently receiving community mental health services: No If no, would patient like referral for services when discharged?: No-"I want to go back to [place]."  Will refer to TCT. Does patient have financial barriers related to discharge medications?: No      Summary/Recommendations:   Summary and Recommendations (to be completed by the evaluator): Patient is a 56 year old male, admitted voluntarily after expressing concern w auditory hallucinations, diagnosed w Schizoaffective Disorder.   history of anxiety, lung cancer, polysubstance abuse, psychosis, and schizoaffective disorder, brought in by [facility], who presents to the Emergency Department.  Lives alone in a hotel , history of lung cancer, reports difficulty in getting to required medical treatments, limited social support.  No current mental health providers.Keith Murray can benefit from crises stabilixzation, medication management, therapeutic milieu and referral for serviees.  Trish Mage. 05/19/2016

## 2016-05-22 ENCOUNTER — Emergency Department (HOSPITAL_COMMUNITY)
Admission: EM | Admit: 2016-05-22 | Discharge: 2016-05-22 | Disposition: A | Discharge status: Home / Self Care | Payer: Medicare Other | Attending: Emergency Medicine | Admitting: Emergency Medicine

## 2016-05-22 ENCOUNTER — Encounter (HOSPITAL_COMMUNITY): Payer: Self-pay | Admitting: Emergency Medicine

## 2016-05-22 DIAGNOSIS — F17200 Nicotine dependence, unspecified, uncomplicated: Secondary | ICD-10-CM | POA: Diagnosis not present

## 2016-05-22 DIAGNOSIS — W1830XA Fall on same level, unspecified, initial encounter: Secondary | ICD-10-CM | POA: Diagnosis not present

## 2016-05-22 DIAGNOSIS — Y999 Unspecified external cause status: Secondary | ICD-10-CM | POA: Insufficient documentation

## 2016-05-22 DIAGNOSIS — Z85118 Personal history of other malignant neoplasm of bronchus and lung: Secondary | ICD-10-CM | POA: Insufficient documentation

## 2016-05-22 DIAGNOSIS — F918 Other conduct disorders: Secondary | ICD-10-CM | POA: Insufficient documentation

## 2016-05-22 DIAGNOSIS — F10120 Alcohol abuse with intoxication, uncomplicated: Secondary | ICD-10-CM | POA: Insufficient documentation

## 2016-05-22 DIAGNOSIS — Y9301 Activity, walking, marching and hiking: Secondary | ICD-10-CM | POA: Diagnosis not present

## 2016-05-22 DIAGNOSIS — Y909 Presence of alcohol in blood, level not specified: Secondary | ICD-10-CM | POA: Diagnosis not present

## 2016-05-22 DIAGNOSIS — M545 Low back pain: Secondary | ICD-10-CM | POA: Insufficient documentation

## 2016-05-22 DIAGNOSIS — Y92480 Sidewalk as the place of occurrence of the external cause: Secondary | ICD-10-CM | POA: Insufficient documentation

## 2016-05-22 DIAGNOSIS — Z79899 Other long term (current) drug therapy: Secondary | ICD-10-CM | POA: Diagnosis not present

## 2016-05-22 DIAGNOSIS — R531 Weakness: Secondary | ICD-10-CM | POA: Diagnosis not present

## 2016-05-22 DIAGNOSIS — R4689 Other symptoms and signs involving appearance and behavior: Secondary | ICD-10-CM

## 2016-05-22 DIAGNOSIS — F10920 Alcohol use, unspecified with intoxication, uncomplicated: Secondary | ICD-10-CM

## 2016-05-22 NOTE — ED Triage Notes (Signed)
Brought in by [facility] from a side of a road with c/o generalized pain after his fall.   Pt arrived to ED ambulatory, and belligerent, talking and cursing loudly.  Pt obviously etoh intoxicated--- admits to heavy drinking tonight.

## 2016-05-22 NOTE — ED Notes (Signed)
Patient escorted out by [employer].

## 2016-05-22 NOTE — ED Triage Notes (Signed)
Patient was walking down street and fell. Someone called [employer]. Patient states he hurts all over. Patient was brought in by [facility]. Patient is intoxicated.

## 2016-05-22 NOTE — ED Provider Notes (Signed)
[facility] DEPT Provider Note   CSN: [id number] Arrival date & time: 05/22/16  0128    By signing my name below, I, Macon Large, attest that this documentation has been prepared under the direction and in the presence of Merryl Hacker, MD. Electronically Signed: Macon Large, ED Scribe. 05/22/16. 1:47 AM.  History   Chief Complaint Chief Complaint  Patient presents with  . Alcohol Intoxication   The history is provided by the patient. No language interpreter was used.     LEVEL 5 CAVEAT: HPI and ROS limited due to acuity of condition.   HPI Comments: Keith Murray is a 56 y.o. male with PMHx of anxiety, schizoaffective disorder, polysubstance abuse, psychosis brought in by [facility] who presents to the Emergency Department complaining of sudden onset, generalized pain s/p fall that occurred yesterday. Per nurse note, pt was found on the side of the road by [facility]. Pt notes he hurts all over while pointing to his lower back and bilateral arms. Pt is belligerent while cursing and yelling. He will not provide any history. He states "why are you here" when asked why he is in the emergency room.  Past Medical History:  Diagnosis Date  . Anxiety   . Cancer ([place])    lung  . Polysubstance abuse    cocaine, etoh, marijuana  . Psychosis   . Schizoaffective disorder [facility])     Patient Active Problem List   Diagnosis Date Noted  . Schizoaffective disorder, depressive type ([place])   . Grief 05/17/2016  . Anemia complicating neoplastic disease 12/07/2015  . Encounter for antineoplastic chemotherapy 12/07/2015  . Swelling of right upper extremity 11/07/2015  . Skin metastases ([place]) 11/06/2015  . Pain of metastatic malignancy 11/06/2015  . Malignant neoplasm of right upper lobe of lung ([place]) 11/05/2015  . Hypophosphatemia 11/04/2015  . Hypercalcemia of malignancy 10/31/2015  . Lung mass 10/31/2015  . Postobstructive pneumonia 10/31/2015  . Hypercalcemia 10/31/2015  .  Leukocytosis 10/31/2015  . Cocaine use 08/10/2015  . Cocaine-induced mood disorder ([place]) 08/10/2015  . Alcohol abuse with intoxication ([place]) 04/14/2015  . Cocaine abuse with cocaine-induced mood disorder ([place]) 04/14/2015  . Psychoses   . Schizoaffective disorder, bipolar type ([place])   . Alcohol dependence ([place]) 06/08/2013  . Schizoaffective disorder ([place]) 08/08/2012  . Polysubstance (excluding opioids) dependence ([place]) 08/08/2012    Past Surgical History:  Procedure Laterality Date  . HERNIA REPAIR         Home Medications    Prior to Admission medications   Medication Sig Start Date End Date Taking? Authorizing Provider  benztropine (COGENTIN) 0.5 MG tablet Take 1 tablet (0.5 mg total) by mouth 2 (two) times daily. 05/17/16  Yes Kerrie Buffalo, NP  FLUoxetine (PROZAC) 20 MG capsule Take 1 capsule (20 mg total) by mouth daily. 05/18/16  Yes Kerrie Buffalo, NP  haloperidol (HALDOL) 5 MG tablet Take 1 tablet (5 mg total) by mouth 2 (two) times daily. 05/17/16  Yes Kerrie Buffalo, NP  hydrOXYzine (ATARAX/VISTARIL) 25 MG tablet Take 1 tablet (25 mg total) by mouth every 6 (six) hours as needed for anxiety. 05/17/16  Yes Kerrie Buffalo, NP  traZODone (DESYREL) 100 MG tablet Take 1 tablet (100 mg total) by mouth at bedtime. 05/17/16  Yes Kerrie Buffalo, NP    Family History Family History  Problem Relation Age of Onset  . Cancer Neg Hx   . Schizophrenia Neg Hx     Social History Social History  Substance Use Topics  . Smoking status:  Current Every Day Smoker    Packs/day: 0.50    Years: 4.00  . Smokeless tobacco: Never Used  . Alcohol use Yes     Comment: occasionally drinks 1 to 2 beers     Allergies   Patient has no known allergies.   Review of Systems Review of Systems  Unable to perform ROS: Other  Musculoskeletal: Positive for back pain and myalgias.  All other systems reviewed and are negative.    Physical Exam Updated Vital Signs BP 152/98 (BP Location: Left  Arm)   Pulse 85   Temp 97.6 F (36.4 C) (Oral)   Resp 18   SpO2 98%   Physical Exam  Constitutional:  Aggressive, cursing,  HENT:  Head: Normocephalic and atraumatic.  Cardiovascular: Normal rate and regular rhythm.   No murmur Char[place]. Pulmonary/Chest: Effort normal. No respiratory distress.  Musculoskeletal:  No obvious deformities  Neurological: He is alert.  Oriented, ambulatory independently  Skin: Skin is warm and dry.  Psychiatric:  Agitated, unable to be redirected, appears intoxicated  Nursing note and vitals reviewed.    ED Treatments / Results   COORDINATION OF CARE: 1:43 AM Discussed treatment plan with pt at bedside and pt agreed to plan.   Labs (all labs ordered are listed, but only abnormal results are displayed) Labs Reviewed - No data to display  EKG  EKG Interpretation None       Radiology No results found.  Procedures Procedures (including critical care time)  Medications Ordered in ED Medications - No data to display   Initial Impression / Assessment and Plan / ED Course  I have reviewed the triage vital signs and the nursing notes.  Pertinent labs & imaging results that were available during my care of the patient were reviewed by me and considered in my medical decision making (see chart for details).  Clinical Course as of May 22 217  Nancy Fetter May 22, 2016  0214 Patient verbally abusive to staff, aggressive, unable to cooperate with exam, is ambulatory independently. Have requested police to the bedside. We'll obtain a set of vital signs. If reassuring, patient considered medically clear for discharge to jail.  [CH]    Clinical Course User Index [CH] Merryl Hacker, MD    Patient presents after falling from standing on the sidewalk. Was brought in by [facility]. No obvious traumatic injury. He is belligerent, cursing at staff and verbally abusive. On my initial assessment, he provides minimal history. Initial vital signs are reassuring.  He was just evaluated and discharged from the behavioral health Hospital after a voluntary admission.  Patient is generally uncooperative and refuses evaluation. Because of his abusive toward staff, will have him escorted by police off the property. He has been medically cleared at this time.   After history, exam, and medical workup I feel the patient has been appropriately medically screened and is safe for discharge home. Pertinent diagnoses were discussed with the patient. Patient was given return precautions.   Final Clinical Impressions(s) / ED Diagnoses   Final diagnoses:  Alcoholic intoxication without complication ([place])  Verbally abusive behavior    New Prescriptions New Prescriptions   No medications on file   I personally performed the services described in this documentation, which was scribed in my presence. The recorded information has been reviewed and is accurate.     Merryl Hacker, MD 05/22/16 [phone]

## 2016-05-22 NOTE — ED Notes (Signed)
Bed: WA10 Expected date:  Expected time:  Means of arrival:  Comments: EMS 

## 2016-05-25 ENCOUNTER — Other Ambulatory Visit: Payer: Self-pay

## 2016-06-05 DIAGNOSIS — J189 Pneumonia, unspecified organism: Secondary | ICD-10-CM | POA: Diagnosis not present

## 2016-07-06 DIAGNOSIS — J189 Pneumonia, unspecified organism: Secondary | ICD-10-CM | POA: Diagnosis not present

## 2016-08-05 DIAGNOSIS — J189 Pneumonia, unspecified organism: Secondary | ICD-10-CM | POA: Diagnosis not present

## 2017-05-18 DIAGNOSIS — E7211 Homocystinuria: Secondary | ICD-10-CM | POA: Diagnosis not present

## 2017-05-18 DIAGNOSIS — R7303 Prediabetes: Secondary | ICD-10-CM | POA: Diagnosis not present

## 2017-05-18 DIAGNOSIS — Z136 Encounter for screening for cardiovascular disorders: Secondary | ICD-10-CM | POA: Diagnosis not present

## 2017-05-18 DIAGNOSIS — Z01118 Encounter for examination of ears and hearing with other abnormal findings: Secondary | ICD-10-CM | POA: Diagnosis not present

## 2017-05-18 DIAGNOSIS — Z131 Encounter for screening for diabetes mellitus: Secondary | ICD-10-CM | POA: Diagnosis not present

## 2017-05-18 DIAGNOSIS — H538 Other visual disturbances: Secondary | ICD-10-CM | POA: Diagnosis not present

## 2017-05-18 DIAGNOSIS — Z0000 Encounter for general adult medical examination without abnormal findings: Secondary | ICD-10-CM | POA: Diagnosis not present

## 2017-05-18 DIAGNOSIS — E538 Deficiency of other specified B group vitamins: Secondary | ICD-10-CM | POA: Diagnosis not present

## 2017-05-18 DIAGNOSIS — I10 Essential (primary) hypertension: Secondary | ICD-10-CM | POA: Diagnosis not present

## 2017-05-18 DIAGNOSIS — E785 Hyperlipidemia, unspecified: Secondary | ICD-10-CM | POA: Diagnosis not present

## 2017-06-06 DIAGNOSIS — E785 Hyperlipidemia, unspecified: Secondary | ICD-10-CM | POA: Diagnosis not present

## 2017-06-06 DIAGNOSIS — E7211 Homocystinuria: Secondary | ICD-10-CM | POA: Diagnosis not present

## 2017-06-06 DIAGNOSIS — E538 Deficiency of other specified B group vitamins: Secondary | ICD-10-CM | POA: Diagnosis not present

## 2017-06-06 DIAGNOSIS — M792 Neuralgia and neuritis, unspecified: Secondary | ICD-10-CM | POA: Diagnosis not present

## 2017-06-06 DIAGNOSIS — I10 Essential (primary) hypertension: Secondary | ICD-10-CM | POA: Diagnosis not present

## 2017-06-16 DIAGNOSIS — I10 Essential (primary) hypertension: Secondary | ICD-10-CM | POA: Diagnosis not present

## 2017-06-16 DIAGNOSIS — R0600 Dyspnea, unspecified: Secondary | ICD-10-CM | POA: Diagnosis not present

## 2017-07-13 DIAGNOSIS — Z87898 Personal history of other specified conditions: Secondary | ICD-10-CM | POA: Diagnosis not present

## 2017-07-13 DIAGNOSIS — Z720 Tobacco use: Secondary | ICD-10-CM | POA: Diagnosis not present

## 2017-07-13 DIAGNOSIS — R7303 Prediabetes: Secondary | ICD-10-CM | POA: Diagnosis not present

## 2017-07-13 DIAGNOSIS — E785 Hyperlipidemia, unspecified: Secondary | ICD-10-CM | POA: Diagnosis not present

## 2017-07-13 DIAGNOSIS — I10 Essential (primary) hypertension: Secondary | ICD-10-CM | POA: Diagnosis not present

## 2017-08-17 DIAGNOSIS — Z720 Tobacco use: Secondary | ICD-10-CM | POA: Diagnosis not present

## 2017-08-17 DIAGNOSIS — G4700 Insomnia, unspecified: Secondary | ICD-10-CM | POA: Diagnosis not present

## 2017-09-05 DIAGNOSIS — Z79899 Other long term (current) drug therapy: Secondary | ICD-10-CM | POA: Diagnosis not present

## 2017-09-24 DIAGNOSIS — G4700 Insomnia, unspecified: Secondary | ICD-10-CM | POA: Diagnosis not present

## 2017-09-24 DIAGNOSIS — Z720 Tobacco use: Secondary | ICD-10-CM | POA: Diagnosis not present

## 2017-10-25 DIAGNOSIS — Z720 Tobacco use: Secondary | ICD-10-CM | POA: Diagnosis not present

## 2017-10-25 DIAGNOSIS — G4700 Insomnia, unspecified: Secondary | ICD-10-CM | POA: Diagnosis not present

## 2017-11-07 DIAGNOSIS — E785 Hyperlipidemia, unspecified: Secondary | ICD-10-CM | POA: Diagnosis not present

## 2017-11-07 DIAGNOSIS — I10 Essential (primary) hypertension: Secondary | ICD-10-CM | POA: Diagnosis not present

## 2017-11-07 DIAGNOSIS — Z0000 Encounter for general adult medical examination without abnormal findings: Secondary | ICD-10-CM | POA: Diagnosis not present

## 2017-11-07 DIAGNOSIS — E538 Deficiency of other specified B group vitamins: Secondary | ICD-10-CM | POA: Diagnosis not present

## 2017-11-07 DIAGNOSIS — E7211 Homocystinuria: Secondary | ICD-10-CM | POA: Diagnosis not present

## 2017-11-22 DIAGNOSIS — Z720 Tobacco use: Secondary | ICD-10-CM | POA: Diagnosis not present

## 2017-11-22 DIAGNOSIS — G4700 Insomnia, unspecified: Secondary | ICD-10-CM | POA: Diagnosis not present

## 2017-12-11 DIAGNOSIS — F259 Schizoaffective disorder, unspecified: Secondary | ICD-10-CM | POA: Diagnosis not present

## 2017-12-11 DIAGNOSIS — C3491 Malignant neoplasm of unspecified part of right bronchus or lung: Secondary | ICD-10-CM | POA: Diagnosis not present

## 2017-12-11 DIAGNOSIS — Z1211 Encounter for screening for malignant neoplasm of colon: Secondary | ICD-10-CM | POA: Diagnosis not present

## 2017-12-12 ENCOUNTER — Encounter: Payer: Self-pay | Admitting: *Deleted

## 2017-12-12 DIAGNOSIS — C3411 Malignant neoplasm of upper lobe, right bronchus or lung: Secondary | ICD-10-CM

## 2017-12-12 NOTE — Progress Notes (Signed)
Oncology Nurse Navigator Documentation  Oncology Nurse Navigator Flowsheets 12/12/2017  Navigator Location [facility]  Navigator Encounter Type Other/per Dr. Julien Nordmann CT C/A/P ordered.  I will update scheduling to call niece and schedule his scans and follow up with Dr. Julien Nordmann.   Treatment Phase Follow-up  Barriers/Navigation Needs Coordination of Care  Interventions Coordination of Care  Coordination of Care Other  Acuity Level 2  Time Spent with Patient 30

## 2017-12-14 ENCOUNTER — Telehealth: Payer: Self-pay | Admitting: Internal Medicine

## 2017-12-14 NOTE — Telephone Encounter (Signed)
Appts scheduled and has been notified .  I advised patient to call [facility] to schedule CT scans also per 9/17 sch msg

## 2017-12-17 DIAGNOSIS — Z720 Tobacco use: Secondary | ICD-10-CM | POA: Diagnosis not present

## 2017-12-17 DIAGNOSIS — G4700 Insomnia, unspecified: Secondary | ICD-10-CM | POA: Diagnosis not present

## 2017-12-19 ENCOUNTER — Inpatient Hospital Stay: Payer: Medicare Other | Attending: Oncology

## 2017-12-19 DIAGNOSIS — C3411 Malignant neoplasm of upper lobe, right bronchus or lung: Secondary | ICD-10-CM | POA: Diagnosis not present

## 2017-12-19 LAB — CBC WITH DIFFERENTIAL (CANCER CENTER ONLY)
Basophils Absolute: 0 10*3/uL (ref 0.0–0.1)
Basophils Relative: 1 %
Eosinophils Absolute: 0 10*3/uL (ref 0.0–0.5)
Eosinophils Relative: 1 %
HCT: 39.4 % (ref 38.4–49.9)
Hemoglobin: 13.1 g/dL (ref 13.0–17.1)
Lymphocytes Relative: 26 %
Lymphs Abs: 0.9 10*3/uL (ref 0.9–3.3)
MCH: 31.6 pg (ref 27.2–33.4)
MCHC: 33.3 g/dL (ref 32.0–36.0)
MCV: 94.9 fL (ref 79.3–98.0)
Monocytes Absolute: 0.6 10*3/uL (ref 0.1–0.9)
Monocytes Relative: 17 %
Neutro Abs: 1.9 10*3/uL (ref 1.5–6.5)
Neutrophils Relative %: 55 %
Platelet Count: 198 10*3/uL (ref 140–400)
RBC: 4.15 MIL/uL — ABNORMAL LOW (ref 4.20–5.82)
RDW: 14.1 % (ref 11.0–14.6)
WBC Count: 3.5 10*3/uL — ABNORMAL LOW (ref 4.0–10.3)

## 2017-12-19 LAB — CMP (CANCER CENTER ONLY)
ALT: 10 U/L (ref 0–44)
AST: 19 U/L (ref 15–41)
Albumin: 4 g/dL (ref 3.5–5.0)
Alkaline Phosphatase: 109 U/L (ref 38–126)
Anion gap: 8 (ref 5–15)
BUN: 13 mg/dL (ref 6–20)
CO2: 26 mmol/L (ref 22–32)
Calcium: 9.4 mg/dL (ref 8.9–10.3)
Chloride: 108 mmol/L (ref 98–111)
Creatinine: 1.02 mg/dL (ref 0.61–1.24)
GFR, Est AFR Am: >60 mL/min (ref >60)
GFR, Estimated: >60 mL/min (ref >60)
Glucose, Bld: 75 mg/dL (ref 70–99)
Potassium: 4 mmol/L (ref 3.5–5.1)
Sodium: 142 mmol/L (ref 135–145)
Total Bilirubin: 0.5 mg/dL (ref 0.3–1.2)
Total Protein: 7.4 g/dL (ref 6.5–8.1)

## 2017-12-25 ENCOUNTER — Encounter (HOSPITAL_COMMUNITY): Payer: Self-pay

## 2017-12-25 ENCOUNTER — Ambulatory Visit (HOSPITAL_COMMUNITY)
Admission: RE | Admit: 2017-12-25 | Discharge: 2017-12-25 | Disposition: A | Discharge status: Home / Self Care | Payer: Medicare Other | Source: Ambulatory Visit | Attending: Internal Medicine | Admitting: Internal Medicine

## 2017-12-25 ENCOUNTER — Other Ambulatory Visit: Payer: Self-pay | Admitting: Internal Medicine

## 2017-12-25 DIAGNOSIS — C3490 Malignant neoplasm of unspecified part of unspecified bronchus or lung: Secondary | ICD-10-CM

## 2017-12-25 MED ORDER — SODIUM CHLORIDE 0.9 % IJ SOLN
INTRAMUSCULAR | Status: AC
  Filled 2017-12-25: qty 50

## 2017-12-25 MED ORDER — IOHEXOL 300 MG/ML  SOLN: 100.0000 mL | Freq: Once | INTRAMUSCULAR | Status: DC | PRN

## 2017-12-25 NOTE — Assessment & Plan Note (Addendum)
This is a very pleasant 57 year old African-American male with a stage IV non-small cell lung cancer, squamous cell carcinoma. The patient received systemic chemotherapy with carboplatin and paclitaxel is status post 4 cycles. He is tolerated his treatment well except for mild peripheral neuropathy.  The patient has not been seen in our office for almost 2 years.  He failed to keep his follow-up appointments.  He had a restaging CT scan of the chest, abdomen, pelvis earlier today and is here to discuss the results.  The patient was seen with Dr. Julien Nordmann.  Images were reviewed and compared to his prior CT in December 2017.  Overall, there appears to be stable disease to some improvement.  We will await final read by radiology.  If the radiology report reads that the scan is stable or improved, he will continue on observation.  He will have a restaging CT scan of the chest, abdomen, pelvis in 6 months.  He will follow-up 1 to 2 days after the CT scan to discuss the results.  If there are any concerning findings noted on his CT scan, we will contact the patient and see him sooner.  The patient is in agreement to this plan.   The patient voices understanding of current disease status and treatment options and is in agreement with the current care plan.  All questions were answered. The patient knows to call the clinic with any problems, questions or concerns. We can certainly see the patient much sooner if necessary.

## 2017-12-25 NOTE — Progress Notes (Signed)
[place] OFFICE PROGRESS NOTE  Keith Mccreedy, MD [address] [place] [address]  DIAGNOSIS: Stage IV (T3, N2, M1 B) non-small cell lung cancer, squamous cell carcinoma with PDL 1 expression of 25% presented with massive right upper lobe lung mass as well as mediastinal lymphadenopathy and subcutaneous metastatic disease diagnosed in August 2017.  PRIOR THERAPY: 1) Palliative radiotherapy to the right lung and abdominal wall mass under the care of Dr. Lisbeth Renshaw completed on 11/20/2015. 2) Systemic chemotherapy with carboplatin for AUC of 5 and paclitaxel 175 MG/M2 every 3 weeks with Neulasta support. Status post 4 cycles. First dose was given on 12/17/2015.  CURRENT THERAPY:  Observation  INTERVAL HISTORY: Keith Murray 57 y.o. male returns for routine follow-up visit accompanied by his niece.  The patient has not been seen in our office since December 2017.  He failed to keep his follow-up appointments.  The patient reports that he was recently sent for a screening colonoscopy and upon review of the chart, his gastroenterologist recommended that he follow back up with [place].  The patient reports that he is feeling well.  He stays very active.  He denies fevers and chills.  Denies chest pain, shortness of breath, hemoptysis.  He has an occasional cough.  Denies nausea, vomiting, constipation, diarrhea.  Denies recent weight loss or night sweats.  The patient had a CT scan performed earlier this afternoon and is here to discuss the results.  MEDICAL HISTORY: Past Medical History:  Diagnosis Date  . Anxiety   . Cancer ([place])    lung  . Polysubstance abuse ([facility])    cocaine, etoh, marijuana  . Psychosis ([place])   . Schizoaffective disorder ([place])     ALLERGIES:  has No Known Allergies.  MEDICATIONS:  Current Outpatient Medications  Medication Sig Dispense Refill  . benztropine (COGENTIN) 0.5 MG tablet Take 1 tablet (0.5 mg total) by mouth 2 (two) times daily. 60  tablet 0  . FLUoxetine (PROZAC) 20 MG capsule Take 1 capsule (20 mg total) by mouth daily. 30 capsule 0  . haloperidol (HALDOL) 5 MG tablet Take 1 tablet (5 mg total) by mouth 2 (two) times daily. 30 tablet 0  . hydrOXYzine (ATARAX/VISTARIL) 25 MG tablet Take 1 tablet (25 mg total) by mouth every 6 (six) hours as needed for anxiety. 30 tablet 0  . traZODone (DESYREL) 100 MG tablet Take 1 tablet (100 mg total) by mouth at bedtime. 30 tablet 0   No current facility-administered medications for this visit.    Facility-Administered Medications Ordered in Other Visits  Medication Dose Route Frequency Provider Last Rate Last Dose  . sodium chloride 0.9 % injection             SURGICAL HISTORY:  Past Surgical History:  Procedure Laterality Date  . HERNIA REPAIR      REVIEW OF SYSTEMS:   Review of Systems  Constitutional: Negative for appetite change, chills, fatigue, fever and unexpected weight change.  HENT:   Negative for mouth sores, nosebleeds, sore throat and trouble swallowing.   Eyes: Negative for eye problems and icterus.  Respiratory: Negative for hemoptysis, shortness of breath and wheezing.  Positive for cough. Cardiovascular: Negative for chest pain and leg swelling.  Gastrointestinal: Negative for abdominal pain, constipation, diarrhea, nausea and vomiting.  Genitourinary: Negative for bladder incontinence, difficulty urinating, dysuria, frequency and hematuria.   Musculoskeletal: Negative for back pain, gait problem, neck pain and neck stiffness.  Skin: Negative for itching and rash.  Neurological: Negative for dizziness, extremity weakness, gait problem, headaches, light-headedness and seizures.  Hematological: Negative for adenopathy. Does not bruise/bleed easily.  Psychiatric/Behavioral: Negative for confusion, depression and sleep disturbance. The patient is not nervous/anxious.     PHYSICAL EXAMINATION:  Blood pressure 125/87, pulse 80, temperature 98.6 F (37 C),  temperature source Oral, resp. rate 18, height 6\' 1"  (1.854 m), weight 166 lb 4.8 oz (75.4 kg), SpO2 97 %.  ECOG PERFORMANCE STATUS: 1 - Symptomatic but completely ambulatory  Physical Exam  Constitutional: Oriented to person, place, and time and well-developed, well-nourished, and in no distress. No distress.  HENT:  Head: Normocephalic and atraumatic.  Mouth/Throat: Oropharynx is clear and moist. No oropharyngeal exudate.  Eyes: Conjunctivae are normal. Right eye exhibits no discharge. Left eye exhibits no discharge. No scleral icterus.  Neck: Normal range of motion. Neck supple.  Cardiovascular: Normal rate, regular rhythm, normal heart sounds and intact distal pulses.   Pulmonary/Chest: Effort normal and breath sounds normal. No respiratory distress. No wheezes. No rales.  Abdominal: Soft. Bowel sounds are normal. Exhibits no distension and no mass. There is no tenderness.  Musculoskeletal: Normal range of motion. Exhibits no edema.  Lymphadenopathy:    No cervical adenopathy.  Neurological: Alert and oriented to person, place, and time. Exhibits normal muscle tone. Gait normal. Coordination normal.  Skin: Skin is warm and dry. No rash noted. Not diaphoretic. No erythema. No pallor.  Psychiatric: Mood, memory and judgment normal.  Vitals reviewed.  LABORATORY DATA: Lab Results  Component Value Date   WBC 3.5 (L) 12/19/2017   HGB 13.1 12/19/2017   HCT 39.4 12/19/2017   MCV 94.9 12/19/2017   PLT 198 12/19/2017      Chemistry      Component Value Date/Time   NA 142 12/19/2017 0937   NA 140 05/04/2016 1004   K 4.0 12/19/2017 0937   K 4.5 05/04/2016 1004   CL 108 12/19/2017 0937   CO2 26 12/19/2017 0937   CO2 24 05/04/2016 1004   BUN 13 12/19/2017 0937   BUN 10.4 05/04/2016 1004   CREATININE 1.02 12/19/2017 0937   CREATININE 1.0 05/04/2016 1004      Component Value Date/Time   CALCIUM 9.4 12/19/2017 0937   CALCIUM 9.5 05/04/2016 1004   ALKPHOS 109 12/19/2017 0937    ALKPHOS 145 05/04/2016 1004   AST 19 12/19/2017 0937   AST 12 05/04/2016 1004   ALT 10 12/19/2017 0937   ALT 8 05/04/2016 1004   BILITOT 0.5 12/19/2017 0937   BILITOT 0.69 05/04/2016 1004       RADIOGRAPHIC STUDIES:  Ct Chest W Contrast  Result Date: 12/26/2017 CLINICAL DATA:  Right lung cancer EXAM: CT CHEST, ABDOMEN, AND PELVIS WITH CONTRAST TECHNIQUE: Multidetector CT imaging of the chest, abdomen and pelvis was performed following the standard protocol during bolus administration of intravenous contrast. CONTRAST:  164mL OMNIPAQUE IOHEXOL 300 MG/ML  SOLN COMPARISON:  03/15/2016.  11/01/2015. FINDINGS: CT CHEST FINDINGS Cardiovascular: The heart size is normal. No substantial pericardial effusion. Coronary artery calcification is evident. Mediastinum/Nodes: 11 mm short axis precarinal lymph node is stable. 16 mm short axis right hilar lymph node was 19 mm previously. The esophagus has normal imaging features. Numerous small lymph nodes are noted in the right axilla there is no left axillary lymphadenopathy. Lungs/Pleura: The central tracheobronchial airways are patent. Right apical chest wall lesion with internal dystrophic calcification measures 12.8 x 3.8 cm today on axial imaging which compares to 14.8 x 7.8 cm  on the prior study. As noted previously, there is associated destruction of the right first, second, third, and fourth ribs. Tumor infiltration or edema is identified in the fat of the right axilla. Interstitial opacity adjacent to the right chest wall mass is similar to prior and likely reflects changes related to radiation therapy. No new suspicious pulmonary nodule or mass. No pleural effusion. Musculoskeletal: Destruction of the right first through fourth ribs again noted. Otherwise, no worrisome lytic or sclerotic osseous abnormality. CT ABDOMEN PELVIS FINDINGS Hepatobiliary: No focal abnormality within the liver parenchyma. There is no evidence for gallstones, gallbladder wall  thickening, or pericholecystic fluid. No intrahepatic or extrahepatic biliary dilation. Pancreas: No focal mass lesion. No dilatation of the main duct. No intraparenchymal cyst. No peripancreatic edema. Spleen: No splenomegaly. No focal mass lesion. Adrenals/Urinary Tract: No adrenal nodule or mass. Kidneys unremarkable. No evidence for hydroureter. The urinary bladder appears normal for the degree of distention. Stomach/Bowel: Stomach is nondistended. No gastric wall thickening. No evidence of outlet obstruction. Duodenum is normally positioned as is the ligament of Treitz. No small bowel wall thickening. No small bowel dilatation. The terminal ileum is normal. The appendix is normal. No gross colonic mass. No colonic wall thickening. No substantial diverticular change. Vascular/Lymphatic: There is abdominal aortic atherosclerosis without aneurysm. There is no gastrohepatic or hepatoduodenal ligament lymphadenopathy. No intraperitoneal or retroperitoneal lymphadenopathy. No pelvic sidewall lymphadenopathy. Reproductive: Prostate gland appears mildly enlarged. Other: Trace free fluid noted in the pelvis. Musculoskeletal: The right subcutaneous anterior abdominal wall lesion remains barely perceptible (81/2). No worrisome lytic or sclerotic osseous abnormality. Changes of avascular necrosis noted left femoral head. Degenerative changes are evident in the hips bilaterally. IMPRESSION: 1. Right apical chest wall mass measures smaller than 03/05/2016. Mediastinal and right hilar lymphadenopathy is stable since that time. No new or progressive interval findings. 2. Lesions seen on 11/01/2015 in the anterior subcutaneous abdominal wall, near the pancreatic tail, and lateral to the left iliac bone had essentially resolved on the 03/15/2016 exam and show no recurrence today. 3.  Aortic Atherosclerois (ICD10-170.0) 4. AVN left femoral head with degenerative changes in both hips. Electronically Signed   By: Misty Stanley M.D.    On: 12/26/2017 16:24   Ct Abdomen Pelvis W Contrast  Result Date: 12/26/2017 CLINICAL DATA:  Right lung cancer EXAM: CT CHEST, ABDOMEN, AND PELVIS WITH CONTRAST TECHNIQUE: Multidetector CT imaging of the chest, abdomen and pelvis was performed following the standard protocol during bolus administration of intravenous contrast. CONTRAST:  124mL OMNIPAQUE IOHEXOL 300 MG/ML  SOLN COMPARISON:  03/15/2016.  11/01/2015. FINDINGS: CT CHEST FINDINGS Cardiovascular: The heart size is normal. No substantial pericardial effusion. Coronary artery calcification is evident. Mediastinum/Nodes: 11 mm short axis precarinal lymph node is stable. 16 mm short axis right hilar lymph node was 19 mm previously. The esophagus has normal imaging features. Numerous small lymph nodes are noted in the right axilla there is no left axillary lymphadenopathy. Lungs/Pleura: The central tracheobronchial airways are patent. Right apical chest wall lesion with internal dystrophic calcification measures 12.8 x 3.8 cm today on axial imaging which compares to 14.8 x 7.8 cm on the prior study. As noted previously, there is associated destruction of the right first, second, third, and fourth ribs. Tumor infiltration or edema is identified in the fat of the right axilla. Interstitial opacity adjacent to the right chest wall mass is similar to prior and likely reflects changes related to radiation therapy. No new suspicious pulmonary nodule or mass. No pleural effusion.  Musculoskeletal: Destruction of the right first through fourth ribs again noted. Otherwise, no worrisome lytic or sclerotic osseous abnormality. CT ABDOMEN PELVIS FINDINGS Hepatobiliary: No focal abnormality within the liver parenchyma. There is no evidence for gallstones, gallbladder wall thickening, or pericholecystic fluid. No intrahepatic or extrahepatic biliary dilation. Pancreas: No focal mass lesion. No dilatation of the main duct. No intraparenchymal cyst. No peripancreatic  edema. Spleen: No splenomegaly. No focal mass lesion. Adrenals/Urinary Tract: No adrenal nodule or mass. Kidneys unremarkable. No evidence for hydroureter. The urinary bladder appears normal for the degree of distention. Stomach/Bowel: Stomach is nondistended. No gastric wall thickening. No evidence of outlet obstruction. Duodenum is normally positioned as is the ligament of Treitz. No small bowel wall thickening. No small bowel dilatation. The terminal ileum is normal. The appendix is normal. No gross colonic mass. No colonic wall thickening. No substantial diverticular change. Vascular/Lymphatic: There is abdominal aortic atherosclerosis without aneurysm. There is no gastrohepatic or hepatoduodenal ligament lymphadenopathy. No intraperitoneal or retroperitoneal lymphadenopathy. No pelvic sidewall lymphadenopathy. Reproductive: Prostate gland appears mildly enlarged. Other: Trace free fluid noted in the pelvis. Musculoskeletal: The right subcutaneous anterior abdominal wall lesion remains barely perceptible (81/2). No worrisome lytic or sclerotic osseous abnormality. Changes of avascular necrosis noted left femoral head. Degenerative changes are evident in the hips bilaterally. IMPRESSION: 1. Right apical chest wall mass measures smaller than 03/05/2016. Mediastinal and right hilar lymphadenopathy is stable since that time. No new or progressive interval findings. 2. Lesions seen on 11/01/2015 in the anterior subcutaneous abdominal wall, near the pancreatic tail, and lateral to the left iliac bone had essentially resolved on the 03/15/2016 exam and show no recurrence today. 3.  Aortic Atherosclerois (ICD10-170.0) 4. AVN left femoral head with degenerative changes in both hips. Electronically Signed   By: Misty Stanley M.D.   On: 12/26/2017 16:24     ASSESSMENT/PLAN:  Malignant neoplasm of right upper lobe of lung [facility]) This is a very pleasant 57 year old African-American male with a stage IV non-small cell  lung cancer, squamous cell carcinoma. The patient received systemic chemotherapy with carboplatin and paclitaxel is status post 4 cycles. He is tolerated his treatment well except for mild peripheral neuropathy.  The patient has not been seen in our office for almost 2 years.  He failed to keep his follow-up appointments.  He had a restaging CT scan of the chest, abdomen, pelvis earlier today and is here to discuss the results.  The patient was seen with Dr. Julien Nordmann.  Images were reviewed and compared to his prior CT in December 2017.  Overall, there appears to be stable disease to some improvement.  We will await final read by radiology.  If the radiology report reads that the scan is stable or improved, he will continue on observation.  He will have a restaging CT scan of the chest, abdomen, pelvis in 6 months.  He will follow-up 1 to 2 days after the CT scan to discuss the results.  If there are any concerning findings noted on his CT scan, we will contact the patient and see him sooner.  The patient is in agreement to this plan.   The patient voices understanding of current disease status and treatment options and is in agreement with the current care plan.  All questions were answered. The patient knows to call the clinic with any problems, questions or concerns. We can certainly see the patient much sooner if necessary.   Orders Placed This Encounter  Procedures  .  CT ABDOMEN PELVIS W CONTRAST    Standing Status:   Future    Standing Expiration Date:   12/27/2018    Scheduling Instructions:     Call niece - Baxter Flattery to schedule. [phone].    Order Specific Question:   If indicated for the ordered procedure, I authorize the administration of contrast media per Radiology protocol    Answer:   Yes    Order Specific Question:   Preferred imaging location?    Answer:   [facility]    Order Specific Question:   Radiology Contrast Protocol - do NOT remove file path    Answer:    [email or web address]    Order Specific Question:   ** REASON FOR EXAM (FREE TEXT)    Answer:   Lung cancer. Restaging.  . CT CHEST W CONTRAST    Standing Status:   Future    Standing Expiration Date:   12/27/2018    Scheduling Instructions:     Call niece - Baxter Flattery to schedule. [phone].    Order Specific Question:   If indicated for the ordered procedure, I authorize the administration of contrast media per Radiology protocol    Answer:   Yes    Order Specific Question:   Preferred imaging location?    Answer:   [facility]    Order Specific Question:   Radiology Contrast Protocol - do NOT remove file path    Answer:   [email or web address]    Order Specific Question:   ** REASON FOR EXAM (FREE TEXT)    Answer:   Lung cancer. Restaging.  [facility] Kitchen CBC with Differential (Cancer Center Only)    Standing Status:   Future    Standing Expiration Date:   12/27/2018  . CMP ([place] only)    Standing Status:   Future    Standing Expiration Date:   12/27/2018     Mikey Bussing, DNP, AGPCNP-BC, AOCNP 12/26/17   ADDENDUM: Hematology/Oncology Attending: I had a face-to-face encounter with the patient today.  I recommended his care plan.  This is a very pleasant 57 years old African-American male with a stage IV non-small cell lung cancer, squamous cell carcinoma status post palliative radiotherapy to the right lung and abdominal wall mass followed by systemic chemotherapy with carboplatin and paclitaxel for 4 cycles.  He completed his treatment in December 2017 he was lost to follow-up since that time.  He was seen recently by his gastroenterologist and was advised to reestablish care in the clinic.  The patient is feeling fine today with no specific complaints.  He continues to work at regular basis.  I order CT scan of the chest, abdomen and pelvis which were performed earlier today. I personally and independently reviewed the scan images and  discussed the results with the patient and his daughter.  His a scan showed no concerning findings for disease progression and there is improvement in the right apical chest wall mass since December 2017.  He has no other concerning findings in the abdomen or pelvis. I recommended for the patient to continue on observation with repeat CT scan of the chest, abdomen and pelvis in 6 months. He was advised to call immediately if he has any concerning symptoms in the interval.  Disclaimer: This note was dictated with voice recognition software. Similar sounding words can inadvertently be transcribed and may be missed upon review. Eilleen Kempf, MD 12/26/17

## 2017-12-26 ENCOUNTER — Ambulatory Visit (HOSPITAL_COMMUNITY)
Admission: RE | Admit: 2017-12-26 | Discharge: 2017-12-26 | Disposition: A | Discharge status: Home / Self Care | Payer: Medicare Other | Source: Ambulatory Visit | Attending: Internal Medicine | Admitting: Internal Medicine

## 2017-12-26 ENCOUNTER — Encounter: Payer: Self-pay | Admitting: Oncology

## 2017-12-26 ENCOUNTER — Telehealth: Payer: Self-pay | Admitting: Internal Medicine

## 2017-12-26 ENCOUNTER — Telehealth: Payer: Self-pay | Admitting: Medical Oncology

## 2017-12-26 ENCOUNTER — Other Ambulatory Visit: Payer: Self-pay | Admitting: Medical Oncology

## 2017-12-26 ENCOUNTER — Encounter (HOSPITAL_COMMUNITY): Payer: Self-pay

## 2017-12-26 ENCOUNTER — Inpatient Hospital Stay: Payer: Medicare Other | Attending: Oncology | Admitting: Oncology

## 2017-12-26 VITALS — BP 125/87 | HR 80 | Temp 98.6°F | Resp 18 | Ht 73.0 in | Wt 166.3 lb

## 2017-12-26 DIAGNOSIS — R590 Localized enlarged lymph nodes: Secondary | ICD-10-CM | POA: Diagnosis not present

## 2017-12-26 DIAGNOSIS — M87852 Other osteonecrosis, left femur: Secondary | ICD-10-CM | POA: Diagnosis not present

## 2017-12-26 DIAGNOSIS — Z923 Personal history of irradiation: Secondary | ICD-10-CM | POA: Diagnosis not present

## 2017-12-26 DIAGNOSIS — Z9221 Personal history of antineoplastic chemotherapy: Secondary | ICD-10-CM | POA: Diagnosis not present

## 2017-12-26 DIAGNOSIS — M87851 Other osteonecrosis, right femur: Secondary | ICD-10-CM | POA: Insufficient documentation

## 2017-12-26 DIAGNOSIS — Z79899 Other long term (current) drug therapy: Secondary | ICD-10-CM | POA: Diagnosis not present

## 2017-12-26 DIAGNOSIS — I700 Atherosclerosis of aorta: Secondary | ICD-10-CM | POA: Insufficient documentation

## 2017-12-26 DIAGNOSIS — C3491 Malignant neoplasm of unspecified part of right bronchus or lung: Secondary | ICD-10-CM | POA: Diagnosis not present

## 2017-12-26 DIAGNOSIS — C3411 Malignant neoplasm of upper lobe, right bronchus or lung: Secondary | ICD-10-CM

## 2017-12-26 DIAGNOSIS — C3490 Malignant neoplasm of unspecified part of unspecified bronchus or lung: Secondary | ICD-10-CM | POA: Diagnosis not present

## 2017-12-26 DIAGNOSIS — Z85118 Personal history of other malignant neoplasm of bronchus and lung: Secondary | ICD-10-CM | POA: Diagnosis not present

## 2017-12-26 MED ORDER — SODIUM CHLORIDE 0.9 % IJ SOLN
INTRAMUSCULAR | Status: AC
  Filled 2017-12-26: qty 50

## 2017-12-26 MED ORDER — IOHEXOL 300 MG/ML  SOLN
100.0000 mL | Freq: Once | INTRAMUSCULAR | Status: AC | PRN
  Administered 2017-12-26: 100 mL via INTRAVENOUS

## 2017-12-26 NOTE — Telephone Encounter (Signed)
Unsuccessful iv sticks for ct scan. Pt upset and said he is not doing scan today. CT scan rescheduled for today at 2 pm .Claiborne Billings notified.

## 2017-12-26 NOTE — Telephone Encounter (Signed)
Gave avs and calendar ° °

## 2017-12-27 ENCOUNTER — Ambulatory Visit (HOSPITAL_COMMUNITY): Admission: RE | Admit: 2017-12-27 | Payer: Medicare Other | Source: Ambulatory Visit

## 2018-01-17 DIAGNOSIS — Z720 Tobacco use: Secondary | ICD-10-CM | POA: Diagnosis not present

## 2018-01-17 DIAGNOSIS — G4700 Insomnia, unspecified: Secondary | ICD-10-CM | POA: Diagnosis not present

## 2018-01-30 DIAGNOSIS — I10 Essential (primary) hypertension: Secondary | ICD-10-CM | POA: Diagnosis not present

## 2018-01-30 DIAGNOSIS — R7303 Prediabetes: Secondary | ICD-10-CM | POA: Diagnosis not present

## 2018-03-01 DIAGNOSIS — E785 Hyperlipidemia, unspecified: Secondary | ICD-10-CM | POA: Diagnosis not present

## 2018-03-01 DIAGNOSIS — I10 Essential (primary) hypertension: Secondary | ICD-10-CM | POA: Diagnosis not present

## 2018-03-01 DIAGNOSIS — E7211 Homocystinuria: Secondary | ICD-10-CM | POA: Diagnosis not present

## 2018-03-01 DIAGNOSIS — M792 Neuralgia and neuritis, unspecified: Secondary | ICD-10-CM | POA: Diagnosis not present

## 2018-03-01 DIAGNOSIS — E538 Deficiency of other specified B group vitamins: Secondary | ICD-10-CM | POA: Diagnosis not present

## 2018-03-01 DIAGNOSIS — R7303 Prediabetes: Secondary | ICD-10-CM | POA: Diagnosis not present

## 2018-03-01 DIAGNOSIS — Z720 Tobacco use: Secondary | ICD-10-CM | POA: Diagnosis not present

## 2018-03-01 DIAGNOSIS — E782 Mixed hyperlipidemia: Secondary | ICD-10-CM | POA: Diagnosis not present

## 2018-07-02 ENCOUNTER — Inpatient Hospital Stay: Payer: Medicare Other

## 2018-07-02 ENCOUNTER — Ambulatory Visit (HOSPITAL_COMMUNITY): Admission: RE | Admit: 2018-07-02 | Payer: Medicare Other | Source: Ambulatory Visit

## 2018-07-03 ENCOUNTER — Other Ambulatory Visit: Payer: Self-pay

## 2018-07-03 ENCOUNTER — Ambulatory Visit (HOSPITAL_COMMUNITY)
Admission: RE | Admit: 2018-07-03 | Discharge: 2018-07-03 | Disposition: A | Discharge status: Home / Self Care | Payer: Medicare Other | Source: Ambulatory Visit | Attending: Oncology | Admitting: Oncology

## 2018-07-03 ENCOUNTER — Inpatient Hospital Stay: Payer: Medicare Other | Attending: Internal Medicine

## 2018-07-03 DIAGNOSIS — Z9221 Personal history of antineoplastic chemotherapy: Secondary | ICD-10-CM | POA: Diagnosis not present

## 2018-07-03 DIAGNOSIS — C3411 Malignant neoplasm of upper lobe, right bronchus or lung: Secondary | ICD-10-CM

## 2018-07-03 DIAGNOSIS — R918 Other nonspecific abnormal finding of lung field: Secondary | ICD-10-CM | POA: Diagnosis not present

## 2018-07-03 DIAGNOSIS — Z85118 Personal history of other malignant neoplasm of bronchus and lung: Secondary | ICD-10-CM | POA: Diagnosis not present

## 2018-07-03 DIAGNOSIS — C3491 Malignant neoplasm of unspecified part of right bronchus or lung: Secondary | ICD-10-CM | POA: Diagnosis not present

## 2018-07-03 DIAGNOSIS — J439 Emphysema, unspecified: Secondary | ICD-10-CM | POA: Diagnosis not present

## 2018-07-03 LAB — CBC WITH DIFFERENTIAL (CANCER CENTER ONLY)
Abs Immature Granulocytes: 0.01 10*3/uL (ref 0.00–0.07)
Basophils Absolute: 0 10*3/uL (ref 0.0–0.1)
Basophils Relative: 0 %
Eosinophils Absolute: 0.1 10*3/uL (ref 0.0–0.5)
Eosinophils Relative: 1 %
HCT: 35.2 % — ABNORMAL LOW (ref 39.0–52.0)
Hemoglobin: 11.8 g/dL — ABNORMAL LOW (ref 13.0–17.0)
Immature Granulocytes: 0 %
Lymphocytes Relative: 30 %
Lymphs Abs: 1 10*3/uL (ref 0.7–4.0)
MCH: 31.9 pg (ref 26.0–34.0)
MCHC: 33.5 g/dL (ref 30.0–36.0)
MCV: 95.1 fL (ref 80.0–100.0)
Monocytes Absolute: 0.5 10*3/uL (ref 0.1–1.0)
Monocytes Relative: 13 %
Neutro Abs: 2 10*3/uL (ref 1.7–7.7)
Neutrophils Relative %: 56 %
Platelet Count: 182 10*3/uL (ref 150–400)
RBC: 3.7 MIL/uL — ABNORMAL LOW (ref 4.22–5.81)
RDW: 14.4 % (ref 11.5–15.5)
WBC Count: 3.5 10*3/uL — ABNORMAL LOW (ref 4.0–10.5)
nRBC: 0 % (ref 0.0–0.2)

## 2018-07-03 LAB — CMP (CANCER CENTER ONLY)
ALT: 9 U/L (ref 0–44)
AST: 15 U/L (ref 15–41)
Albumin: 3.8 g/dL (ref 3.5–5.0)
Alkaline Phosphatase: 91 U/L (ref 38–126)
Anion gap: 8 (ref 5–15)
BUN: 13 mg/dL (ref 6–20)
CO2: 25 mmol/L (ref 22–32)
Calcium: 8.7 mg/dL — ABNORMAL LOW (ref 8.9–10.3)
Chloride: 106 mmol/L (ref 98–111)
Creatinine: 1.05 mg/dL (ref 0.61–1.24)
GFR, Est AFR Am: >60 mL/min (ref >60)
GFR, Estimated: >60 mL/min (ref >60)
Glucose, Bld: 78 mg/dL (ref 70–99)
Potassium: 4.5 mmol/L (ref 3.5–5.1)
Sodium: 139 mmol/L (ref 135–145)
Total Bilirubin: 0.5 mg/dL (ref 0.3–1.2)
Total Protein: 6.8 g/dL (ref 6.5–8.1)

## 2018-07-03 MED ORDER — SODIUM CHLORIDE (PF) 0.9 % IJ SOLN
INTRAMUSCULAR | Status: AC
  Filled 2018-07-03: qty 50

## 2018-07-03 MED ORDER — IOHEXOL 300 MG/ML  SOLN
100.0000 mL | Freq: Once | INTRAMUSCULAR | Status: AC | PRN
  Administered 2018-07-03: 100 mL via INTRAVENOUS

## 2018-07-04 ENCOUNTER — Inpatient Hospital Stay (HOSPITAL_BASED_OUTPATIENT_CLINIC_OR_DEPARTMENT_OTHER): Payer: Medicare Other | Admitting: Internal Medicine

## 2018-07-04 ENCOUNTER — Encounter: Payer: Self-pay | Admitting: Internal Medicine

## 2018-07-04 ENCOUNTER — Other Ambulatory Visit: Payer: Self-pay

## 2018-07-04 VITALS — BP 105/70 | HR 78 | Temp 98.2°F | Resp 18 | Ht 73.0 in | Wt 157.2 lb

## 2018-07-04 DIAGNOSIS — Z9221 Personal history of antineoplastic chemotherapy: Secondary | ICD-10-CM | POA: Diagnosis not present

## 2018-07-04 DIAGNOSIS — Z85118 Personal history of other malignant neoplasm of bronchus and lung: Secondary | ICD-10-CM

## 2018-07-04 DIAGNOSIS — Z5111 Encounter for antineoplastic chemotherapy: Secondary | ICD-10-CM

## 2018-07-04 DIAGNOSIS — C3490 Malignant neoplasm of unspecified part of unspecified bronchus or lung: Secondary | ICD-10-CM

## 2018-07-04 DIAGNOSIS — C3411 Malignant neoplasm of upper lobe, right bronchus or lung: Secondary | ICD-10-CM

## 2018-07-04 NOTE — Progress Notes (Signed)
[facility] [phone]   [phone]  OFFICE PROGRESS NOTE  Keith Mccreedy, Keith Murray [place] [address]  DIAGNOSIS: Stage IV (T3, N2, M1 B) non-small cell lung cancer, squamous cell carcinoma with PDL 1 expression of 25% presented with massive right upper lobe lung mass as well as mediastinal lymphadenopathy and subcutaneous metastatic disease diagnosed in August 2017.  PRIOR THERAPY: Palliative radiotherapy to the right lung and abdominal wall mass under the care of Dr. Lisbeth Renshaw completed on 11/20/2015.  CURRENT THERAPY: Systemic chemotherapy with carboplatin for AUC of 5 and paclitaxel 175 MG/M2 every 3 weeks with Neulasta support. Status post 5 cycles. First dose was given on 12/17/2015.  INTERVAL HISTORY: Keith Murray 58 y.o. male returns to the clinic today for follow-up visit.  The patient is feeling fine today with no concerning complaints.  He denied having any recent chest pain, shortness of breath, cough or hemoptysis.  He denied having any weight loss or night sweats.  He has no nausea, vomiting, diarrhea or constipation.  He denied having any headache or visual changes.  He has been on observation since September 2017.  The patient has repeat CT scan of the chest, abdomen and pelvis performed recently and is here for evaluation and discussion of his scan results.  MEDICAL HISTORY: Past Medical History:  Diagnosis Date   Anxiety    Cancer ([facility])    lung   Polysubstance abuse ([place])    cocaine, etoh, marijuana   Psychosis ([place])    Schizoaffective disorder ([place])     ALLERGIES:  has No Known Allergies.  MEDICATIONS:  Current Outpatient Medications  Medication Sig Dispense Refill   benztropine (COGENTIN) 0.5 MG tablet Take 1 tablet (0.5 mg total) by mouth 2 (two) times daily. 60 tablet 0   FLUoxetine (PROZAC) 20 MG capsule Take 1 capsule (20 mg total) by mouth daily. 30 capsule 0   haloperidol (HALDOL) 5 MG  tablet Take 1 tablet (5 mg total) by mouth 2 (two) times daily. 30 tablet 0   hydrOXYzine (ATARAX/VISTARIL) 25 MG tablet Take 1 tablet (25 mg total) by mouth every 6 (six) hours as needed for anxiety. 30 tablet 0   traZODone (DESYREL) 100 MG tablet Take 1 tablet (100 mg total) by mouth at bedtime. 30 tablet 0   No current facility-administered medications for this visit.     SURGICAL HISTORY:  Past Surgical History:  Procedure Laterality Date   HERNIA REPAIR      REVIEW OF SYSTEMS:  A comprehensive review of systems was negative.   PHYSICAL EXAMINATION: General appearance: alert, cooperative and no distress Head: Normocephalic, without obvious abnormality, atraumatic Neck: no adenopathy, no JVD, supple, symmetrical, trachea midline and thyroid not enlarged, symmetric, no tenderness/mass/nodules Lymph nodes: Cervical, supraclavicular, and axillary nodes normal. Resp: clear to auscultation bilaterally Back: symmetric, no curvature. ROM normal. No CVA tenderness. Cardio: regular rate and rhythm, S1, S2 normal, no murmur, click, rub or gallop GI: soft, non-tender; bowel sounds normal; no masses,  no organomegaly Extremities: extremities normal, atraumatic, no cyanosis or edema  ECOG PERFORMANCE STATUS: 1 - Symptomatic but completely ambulatory  Blood pressure 105/70, pulse 78, temperature 98.2 F (36.8 C), temperature source Oral, resp. rate 18, height 6\' 1"  (1.854 m), weight 157 lb 3.2 oz (71.3 kg), SpO2 100 %.  LABORATORY DATA: Lab Results  Component Value Date   WBC 3.5 (L) 07/03/2018   HGB 11.8 (L) 07/03/2018   HCT 35.2 (L)  07/03/2018   MCV 95.1 07/03/2018   PLT 182 07/03/2018      Chemistry      Component Value Date/Time   NA 139 07/03/2018 1406   NA 140 05/04/2016 1004   K 4.5 07/03/2018 1406   K 4.5 05/04/2016 1004   CL 106 07/03/2018 1406   CO2 25 07/03/2018 1406   CO2 24 05/04/2016 1004   BUN 13 07/03/2018 1406   BUN 10.4 05/04/2016 1004   CREATININE 1.05  07/03/2018 1406   CREATININE 1.0 05/04/2016 1004      Component Value Date/Time   CALCIUM 8.7 (L) 07/03/2018 1406   CALCIUM 9.5 05/04/2016 1004   ALKPHOS 91 07/03/2018 1406   ALKPHOS 145 05/04/2016 1004   AST 15 07/03/2018 1406   AST 12 05/04/2016 1004   ALT 9 07/03/2018 1406   ALT 8 05/04/2016 1004   BILITOT 0.5 07/03/2018 1406   BILITOT 0.69 05/04/2016 1004       RADIOGRAPHIC STUDIES: Ct Chest W Contrast  Result Date: 07/03/2018 CLINICAL DATA:  Right lung cancer, follow-up, s/p chemo XRT EXAM: CT CHEST, ABDOMEN, AND PELVIS WITH CONTRAST TECHNIQUE: Multidetector CT imaging of the chest, abdomen and pelvis was performed following the standard protocol during bolus administration of intravenous contrast. CONTRAST:  144mL OMNIPAQUE IOHEXOL 300 MG/ML  SOLN COMPARISON:  12/26/2017, 03/15/2016 FINDINGS: CT CHEST FINDINGS Cardiovascular: Coronary artery calcifications. Normal heart size. No pericardial effusion. Mediastinum/Nodes: No change in right hilar lymph nodes and prominent although not pathologically enlarged mediastinal lymph nodes. Thyroid gland, trachea, and esophagus demonstrate no significant findings. Lungs/Pleura: Lungs are clear. No pleural effusion or pneumothorax. Musculoskeletal: Redemonstrated pleural based mass about the right pulmonary apex with destruction of the right first through fourth ribs, measuring approximately 13.0 x 3.6 cm, not significantly changed compared to prior examination (series 2, image 19). CT ABDOMEN PELVIS FINDINGS Hepatobiliary: No focal liver abnormality is seen. No gallstones, gallbladder wall thickening, or biliary dilatation. Pancreas: Unremarkable. No pancreatic ductal dilatation or surrounding inflammatory changes. Spleen: Normal in size without focal abnormality. Adrenals/Urinary Tract: Adrenal glands are unremarkable. Kidneys are normal, without renal calculi, focal lesion, or hydronephrosis. Thickening of the urinary bladder, likely related to  chronic outlet obstruction. Stomach/Bowel: Stomach is within normal limits. Appendix appears normal. No evidence of bowel wall thickening, distention, or inflammatory changes. Vascular/Lymphatic: Scattered calcific atherosclerosis. No enlarged abdominal or pelvic lymph nodes. Reproductive: No mass or other abnormality. Other: No abdominal wall hernia or abnormality. No abdominopelvic ascites. Musculoskeletal: No acute or significant osseous findings. IMPRESSION: 1. Redemonstrated pleural based mass about the right pulmonary apex with destruction of the right first through fourth ribs, measuring approximately 13.0 x 3.6 cm, not significantly changed compared to prior examination (series 2, image 19). No change in underlying scarring and volume loss of the right upper lobe. No change in right hilar lymph nodes and prominent although not pathologically enlarged mediastinal lymph nodes. Stable post treatment appearance. 2.  Mild emphysema. 3.  Coronary artery disease. Electronically Signed   By: Eddie Candle M.D.   On: 07/03/2018 16:57   Ct Abdomen Pelvis W Contrast  Result Date: 07/03/2018 CLINICAL DATA:  Right lung cancer, follow-up, s/p chemo XRT EXAM: CT CHEST, ABDOMEN, AND PELVIS WITH CONTRAST TECHNIQUE: Multidetector CT imaging of the chest, abdomen and pelvis was performed following the standard protocol during bolus administration of intravenous contrast. CONTRAST:  142mL OMNIPAQUE IOHEXOL 300 MG/ML  SOLN COMPARISON:  12/26/2017, 03/15/2016 FINDINGS: CT CHEST FINDINGS Cardiovascular: Coronary artery calcifications. Normal heart size.  No pericardial effusion. Mediastinum/Nodes: No change in right hilar lymph nodes and prominent although not pathologically enlarged mediastinal lymph nodes. Thyroid gland, trachea, and esophagus demonstrate no significant findings. Lungs/Pleura: Lungs are clear. No pleural effusion or pneumothorax. Musculoskeletal: Redemonstrated pleural based mass about the right pulmonary apex  with destruction of the right first through fourth ribs, measuring approximately 13.0 x 3.6 cm, not significantly changed compared to prior examination (series 2, image 19). CT ABDOMEN PELVIS FINDINGS Hepatobiliary: No focal liver abnormality is seen. No gallstones, gallbladder wall thickening, or biliary dilatation. Pancreas: Unremarkable. No pancreatic ductal dilatation or surrounding inflammatory changes. Spleen: Normal in size without focal abnormality. Adrenals/Urinary Tract: Adrenal glands are unremarkable. Kidneys are normal, without renal calculi, focal lesion, or hydronephrosis. Thickening of the urinary bladder, likely related to chronic outlet obstruction. Stomach/Bowel: Stomach is within normal limits. Appendix appears normal. No evidence of bowel wall thickening, distention, or inflammatory changes. Vascular/Lymphatic: Scattered calcific atherosclerosis. No enlarged abdominal or pelvic lymph nodes. Reproductive: No mass or other abnormality. Other: No abdominal wall hernia or abnormality. No abdominopelvic ascites. Musculoskeletal: No acute or significant osseous findings. IMPRESSION: 1. Redemonstrated pleural based mass about the right pulmonary apex with destruction of the right first through fourth ribs, measuring approximately 13.0 x 3.6 cm, not significantly changed compared to prior examination (series 2, image 19). No change in underlying scarring and volume loss of the right upper lobe. No change in right hilar lymph nodes and prominent although not pathologically enlarged mediastinal lymph nodes. Stable post treatment appearance. 2.  Mild emphysema. 3.  Coronary artery disease. Electronically Signed   By: Eddie Candle M.D.   On: 07/03/2018 16:57    ASSESSMENT AND PLAN: This is a very pleasant 58 years old African-American male with a stage IV non-small cell lung cancer, squamous cell carcinoma. The patient underwent systemic chemotherapy with carboplatin and paclitaxel is status post 5  cycles.  The patient tolerated his treatment well but he was lost to follow-up after cycle #5. He is currently on observation and feeling fine. The patient had repeat CT scan of the chest, abdomen and pelvis performed recently. I discussed the scan results with the patient today.  His scan showed no concerning findings for disease progression. I recommended for him to continue on observation with repeat CT scan of the chest in 6 months. He was advised to call immediately if he has any concerning symptoms in the interval. The patient voices understanding of current disease status and treatment options and is in agreement with the current care plan. All questions were answered. The patient knows to call the clinic with any problems, questions or concerns. We can certainly see the patient much sooner if necessary.  I spent 10 minutes counseling the patient face to face. The total time spent in the appointment was 15 minutes.  Disclaimer: This note was dictated with voice recognition software. Similar sounding words can inadvertently be transcribed and may not be corrected upon review.

## 2018-12-31 ENCOUNTER — Inpatient Hospital Stay: Payer: Medicare Other | Attending: Internal Medicine

## 2018-12-31 DIAGNOSIS — C3411 Malignant neoplasm of upper lobe, right bronchus or lung: Secondary | ICD-10-CM | POA: Insufficient documentation

## 2018-12-31 DIAGNOSIS — C781 Secondary malignant neoplasm of mediastinum: Secondary | ICD-10-CM | POA: Insufficient documentation

## 2018-12-31 DIAGNOSIS — Z79899 Other long term (current) drug therapy: Secondary | ICD-10-CM | POA: Insufficient documentation

## 2018-12-31 DIAGNOSIS — F259 Schizoaffective disorder, unspecified: Secondary | ICD-10-CM | POA: Insufficient documentation

## 2018-12-31 DIAGNOSIS — C7989 Secondary malignant neoplasm of other specified sites: Secondary | ICD-10-CM | POA: Insufficient documentation

## 2019-01-02 ENCOUNTER — Telehealth: Payer: Self-pay | Admitting: Internal Medicine

## 2019-01-02 ENCOUNTER — Other Ambulatory Visit: Payer: Self-pay

## 2019-01-02 ENCOUNTER — Encounter: Payer: Self-pay | Admitting: Internal Medicine

## 2019-01-02 ENCOUNTER — Inpatient Hospital Stay: Payer: Medicare Other

## 2019-01-02 ENCOUNTER — Inpatient Hospital Stay (HOSPITAL_BASED_OUTPATIENT_CLINIC_OR_DEPARTMENT_OTHER): Payer: Medicare Other | Admitting: Internal Medicine

## 2019-01-02 VITALS — BP 117/82 | HR 80 | Temp 98.9°F | Resp 18 | Ht 73.0 in | Wt 158.2 lb

## 2019-01-02 DIAGNOSIS — C7989 Secondary malignant neoplasm of other specified sites: Secondary | ICD-10-CM | POA: Diagnosis not present

## 2019-01-02 DIAGNOSIS — Z79899 Other long term (current) drug therapy: Secondary | ICD-10-CM | POA: Diagnosis not present

## 2019-01-02 DIAGNOSIS — C3411 Malignant neoplasm of upper lobe, right bronchus or lung: Secondary | ICD-10-CM | POA: Diagnosis not present

## 2019-01-02 DIAGNOSIS — F259 Schizoaffective disorder, unspecified: Secondary | ICD-10-CM | POA: Diagnosis not present

## 2019-01-02 DIAGNOSIS — C3490 Malignant neoplasm of unspecified part of unspecified bronchus or lung: Secondary | ICD-10-CM

## 2019-01-02 DIAGNOSIS — C781 Secondary malignant neoplasm of mediastinum: Secondary | ICD-10-CM | POA: Diagnosis not present

## 2019-01-02 LAB — CBC WITH DIFFERENTIAL (CANCER CENTER ONLY)
Abs Immature Granulocytes: 0.01 10*3/uL (ref 0.00–0.07)
Basophils Absolute: 0 10*3/uL (ref 0.0–0.1)
Basophils Relative: 0 %
Eosinophils Absolute: 0 10*3/uL (ref 0.0–0.5)
Eosinophils Relative: 1 %
HCT: 37 % — ABNORMAL LOW (ref 39.0–52.0)
Hemoglobin: 12.7 g/dL — ABNORMAL LOW (ref 13.0–17.0)
Immature Granulocytes: 0 %
Lymphocytes Relative: 24 %
Lymphs Abs: 1.1 10*3/uL (ref 0.7–4.0)
MCH: 32.6 pg (ref 26.0–34.0)
MCHC: 34.3 g/dL (ref 30.0–36.0)
MCV: 95.1 fL (ref 80.0–100.0)
Monocytes Absolute: 0.5 10*3/uL (ref 0.1–1.0)
Monocytes Relative: 11 %
Neutro Abs: 3 10*3/uL (ref 1.7–7.7)
Neutrophils Relative %: 64 %
Platelet Count: 189 10*3/uL (ref 150–400)
RBC: 3.89 MIL/uL — ABNORMAL LOW (ref 4.22–5.81)
RDW: 13.7 % (ref 11.5–15.5)
WBC Count: 4.8 10*3/uL (ref 4.0–10.5)
nRBC: 0 % (ref 0.0–0.2)

## 2019-01-02 LAB — CMP (CANCER CENTER ONLY)
ALT: 9 U/L (ref 0–44)
AST: 13 U/L — ABNORMAL LOW (ref 15–41)
Albumin: 4 g/dL (ref 3.5–5.0)
Alkaline Phosphatase: 98 U/L (ref 38–126)
Anion gap: 6 (ref 5–15)
BUN: 13 mg/dL (ref 6–20)
CO2: 25 mmol/L (ref 22–32)
Calcium: 9 mg/dL (ref 8.9–10.3)
Chloride: 107 mmol/L (ref 98–111)
Creatinine: 1.07 mg/dL (ref 0.61–1.24)
GFR, Est AFR Am: >60 mL/min (ref >60)
GFR, Estimated: >60 mL/min (ref >60)
Glucose, Bld: 94 mg/dL (ref 70–99)
Potassium: 4.2 mmol/L (ref 3.5–5.1)
Sodium: 138 mmol/L (ref 135–145)
Total Bilirubin: 0.6 mg/dL (ref 0.3–1.2)
Total Protein: 7.1 g/dL (ref 6.5–8.1)

## 2019-01-02 NOTE — Progress Notes (Signed)
[facility] [phone]   [phone]  OFFICE PROGRESS NOTE  Benito Mccreedy, MD [place] [address]  DIAGNOSIS: Stage IV (T3, N2, M1 B) non-small cell lung cancer, squamous cell carcinoma with PDL 1 expression of 25% presented with massive right upper lobe lung mass as well as mediastinal lymphadenopathy and subcutaneous metastatic disease diagnosed in August 2017.  PRIOR THERAPY: 1) Palliative radiotherapy to the right lung and abdominal wall mass under the care of Dr. Lisbeth Renshaw completed on 11/20/2015. 2) Systemic chemotherapy with carboplatin for AUC of 5 and paclitaxel 175 MG/M2 every 3 weeks with Neulasta support. Status post 5 cycles. First dose was given on 12/17/2015.    CURRENT THERAPY: Observation.  INTERVAL HISTORY: Keith Murray 58 y.o. male returns to the clinic today for follow-up visit.  The patient is currently on observation.  He has no complaints.  He denied having any chest pain, shortness of breath, cough or hemoptysis.  He denied having any fever or chills.  He has no nausea, vomiting, diarrhea or constipation.  He has no headache or visual changes.  He was supposed to have repeat CT scan of the chest, abdomen pelvis before this visit but unfortunately the scan were not scheduled as planned.  MEDICAL HISTORY: Past Medical History:  Diagnosis Date   Anxiety    Cancer ([facility])    lung   Polysubstance abuse ([place])    cocaine, etoh, marijuana   Psychosis ([place])    Schizoaffective disorder ([facility])     ALLERGIES:  has No Known Allergies.  MEDICATIONS:  Current Outpatient Medications  Medication Sig Dispense Refill   benztropine (COGENTIN) 0.5 MG tablet Take 1 tablet (0.5 mg total) by mouth 2 (two) times daily. 60 tablet 0   FLUoxetine (PROZAC) 20 MG capsule Take 1 capsule (20 mg total) by mouth daily. 30 capsule 0   haloperidol (HALDOL) 5 MG tablet Take 1 tablet (5 mg total) by mouth 2 (two) times daily.  30 tablet 0   hydrOXYzine (ATARAX/VISTARIL) 25 MG tablet Take 1 tablet (25 mg total) by mouth every 6 (six) hours as needed for anxiety. 30 tablet 0   traZODone (DESYREL) 100 MG tablet Take 1 tablet (100 mg total) by mouth at bedtime. 30 tablet 0   No current facility-administered medications for this visit.     SURGICAL HISTORY:  Past Surgical History:  Procedure Laterality Date   HERNIA REPAIR      REVIEW OF SYSTEMS:  A comprehensive review of systems was negative.   PHYSICAL EXAMINATION: General appearance: alert, cooperative and no distress Head: Normocephalic, without obvious abnormality, atraumatic Neck: no adenopathy, no JVD, supple, symmetrical, trachea midline and thyroid not enlarged, symmetric, no tenderness/mass/nodules Lymph nodes: Cervical, supraclavicular, and axillary nodes normal. Resp: clear to auscultation bilaterally Back: symmetric, no curvature. ROM normal. No CVA tenderness. Cardio: regular rate and rhythm, S1, S2 normal, no murmur, click, rub or gallop GI: soft, non-tender; bowel sounds normal; no masses,  no organomegaly Extremities: extremities normal, atraumatic, no cyanosis or edema  ECOG PERFORMANCE STATUS: 1 - Symptomatic but completely ambulatory  Blood pressure 117/82, pulse 80, temperature 98.9 F (37.2 C), temperature source Temporal, resp. rate 18, height 6\' 1"  (1.854 m), weight 158 lb 3.2 oz (71.8 kg), SpO2 100 %.  LABORATORY DATA: Lab Results  Component Value Date   WBC 3.5 (L) 07/03/2018   HGB 11.8 (L) 07/03/2018   HCT 35.2 (L) 07/03/2018   MCV 95.1 07/03/2018  PLT 182 07/03/2018      Chemistry      Component Value Date/Time   NA 139 07/03/2018 1406   NA 140 05/04/2016 1004   K 4.5 07/03/2018 1406   K 4.5 05/04/2016 1004   CL 106 07/03/2018 1406   CO2 25 07/03/2018 1406   CO2 24 05/04/2016 1004   BUN 13 07/03/2018 1406   BUN 10.4 05/04/2016 1004   CREATININE 1.05 07/03/2018 1406   CREATININE 1.0 05/04/2016 1004        Component Value Date/Time   CALCIUM 8.7 (L) 07/03/2018 1406   CALCIUM 9.5 05/04/2016 1004   ALKPHOS 91 07/03/2018 1406   ALKPHOS 145 05/04/2016 1004   AST 15 07/03/2018 1406   AST 12 05/04/2016 1004   ALT 9 07/03/2018 1406   ALT 8 05/04/2016 1004   BILITOT 0.5 07/03/2018 1406   BILITOT 0.69 05/04/2016 1004       RADIOGRAPHIC STUDIES: No results found.  ASSESSMENT AND PLAN: This is a very pleasant 58 years old African-American male with a stage IV non-small cell lung cancer, squamous cell carcinoma. The patient underwent systemic chemotherapy with carboplatin and paclitaxel is status post 5 cycles.  The patient tolerated his treatment well but he was lost to follow-up after cycle #5. The patient is currently on observation and he is feeling fine with no concerning complaints.  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 He was supposed to have repeat CT scan of the chest, abdomen pelvis before this visit but the scan was not scheduled as planned. I will try to get his scan done in the next few days with a follow-up visit in 2 weeks for discussion of his scan results and further recommendation regarding his condition. He was advised to call immediately if he has any concerning symptoms in the interval. The patient voices understanding of current disease status and treatment options and is in agreement with the current care plan. All questions were answered. The patient knows to call the clinic with any problems, questions or concerns. We can certainly see the patient much sooner if necessary.  I spent 10 minutes counseling the patient face to face. The total time spent in the appointment was 15 minutes.  Disclaimer: This note was dictated with voice recognition software. Similar sounding words can inadvertently be transcribed and may not be corrected upon review.

## 2019-01-02 NOTE — Telephone Encounter (Signed)
Scheduled per 10/07 los, patient received after visit summary and calender.

## 2019-01-03 ENCOUNTER — Ambulatory Visit (HOSPITAL_COMMUNITY)
Admission: RE | Admit: 2019-01-03 | Discharge: 2019-01-03 | Disposition: A | Discharge status: Home / Self Care | Payer: Medicare Other | Source: Ambulatory Visit | Attending: Internal Medicine | Admitting: Internal Medicine

## 2019-01-03 DIAGNOSIS — N4289 Other specified disorders of prostate: Secondary | ICD-10-CM | POA: Diagnosis not present

## 2019-01-03 DIAGNOSIS — R590 Localized enlarged lymph nodes: Secondary | ICD-10-CM | POA: Diagnosis not present

## 2019-01-03 DIAGNOSIS — M47816 Spondylosis without myelopathy or radiculopathy, lumbar region: Secondary | ICD-10-CM | POA: Diagnosis not present

## 2019-01-03 DIAGNOSIS — C3490 Malignant neoplasm of unspecified part of unspecified bronchus or lung: Secondary | ICD-10-CM | POA: Diagnosis not present

## 2019-01-03 DIAGNOSIS — I2510 Atherosclerotic heart disease of native coronary artery without angina pectoris: Secondary | ICD-10-CM | POA: Diagnosis not present

## 2019-01-03 DIAGNOSIS — N200 Calculus of kidney: Secondary | ICD-10-CM | POA: Diagnosis not present

## 2019-01-03 DIAGNOSIS — M898X8 Other specified disorders of bone, other site: Secondary | ICD-10-CM | POA: Diagnosis not present

## 2019-01-03 MED ORDER — SODIUM CHLORIDE (PF) 0.9 % IJ SOLN
INTRAMUSCULAR | Status: AC
  Filled 2019-01-03: qty 50

## 2019-01-03 MED ORDER — IOHEXOL 300 MG/ML  SOLN
100.0000 mL | Freq: Once | INTRAMUSCULAR | Status: AC | PRN
  Administered 2019-01-03: 100 mL via INTRAVENOUS

## 2019-01-09 ENCOUNTER — Ambulatory Visit (HOSPITAL_COMMUNITY): Payer: Medicare Other

## 2019-01-16 ENCOUNTER — Encounter: Payer: Self-pay | Admitting: Physician Assistant

## 2019-01-16 ENCOUNTER — Other Ambulatory Visit: Payer: Self-pay

## 2019-01-16 ENCOUNTER — Inpatient Hospital Stay (HOSPITAL_BASED_OUTPATIENT_CLINIC_OR_DEPARTMENT_OTHER): Payer: Medicare Other | Admitting: Physician Assistant

## 2019-01-16 VITALS — BP 126/86 | HR 80 | Temp 98.5°F | Resp 20 | Ht 73.0 in | Wt 152.5 lb

## 2019-01-16 DIAGNOSIS — C3411 Malignant neoplasm of upper lobe, right bronchus or lung: Secondary | ICD-10-CM

## 2019-01-16 DIAGNOSIS — Z79899 Other long term (current) drug therapy: Secondary | ICD-10-CM | POA: Diagnosis not present

## 2019-01-16 DIAGNOSIS — C781 Secondary malignant neoplasm of mediastinum: Secondary | ICD-10-CM | POA: Diagnosis not present

## 2019-01-16 DIAGNOSIS — F259 Schizoaffective disorder, unspecified: Secondary | ICD-10-CM | POA: Diagnosis not present

## 2019-01-16 DIAGNOSIS — C7989 Secondary malignant neoplasm of other specified sites: Secondary | ICD-10-CM | POA: Diagnosis not present

## 2019-01-16 NOTE — Progress Notes (Signed)
[facility] OFFICE PROGRESS NOTE  Keith Mccreedy, MD [address] [place] [address]  DIAGNOSIS: Stage IV (T3, N2, M1 B) non-small cell lung cancer, squamous cell carcinoma with PDL 1 expression of 25% presented with massive right upper lobe lung mass as well as mediastinal lymphadenopathy and subcutaneous metastatic disease diagnosed in August 2017.  PRIOR THERAPY:  1) Palliative radiotherapy to the right lung and abdominal wall mass under the care of Dr. Lisbeth Renshaw completed on 11/20/2015. 2) Systemic chemotherapy with carboplatin for AUC of 5 and paclitaxel 175 MG/M2 every 3 weeks with Neulasta support. Status post 5 cycles. First dose was given on 12/17/2015.  CURRENT THERAPY: Observation  INTERVAL HISTORY: Keith Murray 58 y.o. male returns to the clinic for a follow up visit. The patient is feeling well today without any concerning complaints. He has been on observation since 2017. Denies any fever, chills, or night sweats. He lost approximately 5 lbs since his last appointment but the patient states that he is eating well and has an appetite. Denies any chest pain, shortness of breath, cough, or hemoptysis. Denies any nausea, vomiting, diarrhea, or constipation. Denies any headache or visual changes. The patient is here today for evaluation and to review his scan results.   MEDICAL HISTORY: Past Medical History:  Diagnosis Date  . Anxiety   . Cancer ([facility])    lung  . Polysubstance abuse ([facility])    cocaine, etoh, marijuana  . Psychosis ([facility])   . Schizoaffective disorder ([place])     ALLERGIES:  has No Known Allergies.  MEDICATIONS:  Current Outpatient Medications  Medication Sig Dispense Refill  . benztropine (COGENTIN) 0.5 MG tablet Take 1 tablet (0.5 mg total) by mouth 2 (two) times daily. 60 tablet 0  . FLUoxetine (PROZAC) 20 MG capsule Take 1 capsule (20 mg total) by mouth daily. 30 capsule 0  . haloperidol (HALDOL) 5 MG tablet Take 1 tablet (5 mg total) by  mouth 2 (two) times daily. 30 tablet 0  . hydrOXYzine (ATARAX/VISTARIL) 25 MG tablet Take 1 tablet (25 mg total) by mouth every 6 (six) hours as needed for anxiety. 30 tablet 0  . traZODone (DESYREL) 100 MG tablet Take 1 tablet (100 mg total) by mouth at bedtime. 30 tablet 0   No current facility-administered medications for this visit.     SURGICAL HISTORY:  Past Surgical History:  Procedure Laterality Date  . HERNIA REPAIR      REVIEW OF SYSTEMS:   Review of Systems  Constitutional: Negative for appetite change, chills, fatigue, fever and unexpected weight change.  HENT:   Negative for mouth sores, nosebleeds, sore throat and trouble swallowing.   Eyes: Negative for eye problems and icterus.  Respiratory: Negative for cough, hemoptysis, shortness of breath and wheezing.   Cardiovascular: Negative for chest pain and leg swelling.  Gastrointestinal: Negative for abdominal pain, constipation, diarrhea, nausea and vomiting.  Genitourinary: Negative for bladder incontinence, difficulty urinating, dysuria, frequency and hematuria.   Musculoskeletal: Negative for back pain, gait problem, neck pain and neck stiffness.  Skin: Negative for itching and rash.  Neurological: Negative for dizziness, extremity weakness, gait problem, headaches, light-headedness and seizures.  Hematological: Negative for adenopathy. Does not bruise/bleed easily.  Psychiatric/Behavioral: Negative for confusion, depression and sleep disturbance. The patient is not nervous/anxious.     PHYSICAL EXAMINATION:  Blood pressure 126/86, pulse 80, temperature 98.5 F (36.9 C), temperature source Temporal, resp. rate 20, height 6\' 1"  (1.854 m), weight 152 lb 8 oz (  69.2 kg), SpO2 98 %.  ECOG PERFORMANCE STATUS: 0 - Asymptomatic  Physical Exam  Constitutional: Oriented to person, place, and time and well-developed, well-nourished, and in no distress.  HENT:  Head: Normocephalic and atraumatic.  Mouth/Throat: Oropharynx  is clear and moist. No oropharyngeal exudate.  Eyes: Conjunctivae are normal. Right eye exhibits no discharge. Left eye exhibits no discharge. No scleral icterus.  Neck: Normal range of motion. Neck supple.  Cardiovascular: Normal rate, regular rhythm, normal heart sounds and intact distal pulses.   Pulmonary/Chest: Effort normal and breath sounds normal. No respiratory distress. No wheezes. No rales.  Abdominal: Soft. Bowel sounds are normal. Exhibits no distension and no mass. There is no tenderness.  Musculoskeletal: Normal range of motion. Exhibits no edema.  Lymphadenopathy:    No cervical adenopathy.  Neurological: Alert and oriented to person, place, and time. Exhibits normal muscle tone. Gait normal. Coordination normal.  Skin: Skin is warm and dry. No rash noted. Not diaphoretic. No erythema. No pallor.  Psychiatric: Mood, memory and judgment normal.  Vitals reviewed.  LABORATORY DATA: Lab Results  Component Value Date   WBC 4.8 01/02/2019   HGB 12.7 (L) 01/02/2019   HCT 37.0 (L) 01/02/2019   MCV 95.1 01/02/2019   PLT 189 01/02/2019      Chemistry      Component Value Date/Time   NA 138 01/02/2019 1121   NA 140 05/04/2016 1004   K 4.2 01/02/2019 1121   K 4.5 05/04/2016 1004   CL 107 01/02/2019 1121   CO2 25 01/02/2019 1121   CO2 24 05/04/2016 1004   BUN 13 01/02/2019 1121   BUN 10.4 05/04/2016 1004   CREATININE 1.07 01/02/2019 1121   CREATININE 1.0 05/04/2016 1004      Component Value Date/Time   CALCIUM 9.0 01/02/2019 1121   CALCIUM 9.5 05/04/2016 1004   ALKPHOS 98 01/02/2019 1121   ALKPHOS 145 05/04/2016 1004   AST 13 (L) 01/02/2019 1121   AST 12 05/04/2016 1004   ALT 9 01/02/2019 1121   ALT 8 05/04/2016 1004   BILITOT 0.6 01/02/2019 1121   BILITOT 0.69 05/04/2016 1004       RADIOGRAPHIC STUDIES:  Ct Chest W Contrast  Result Date: 01/03/2019 CLINICAL DATA:  Patient with history of lung cancer. Restaging exam. EXAM: CT CHEST, ABDOMEN, AND PELVIS  WITH CONTRAST TECHNIQUE: Multidetector CT imaging of the chest, abdomen and pelvis was performed following the standard protocol during bolus administration of intravenous contrast. CONTRAST:  137mL OMNIPAQUE IOHEXOL 300 MG/ML  SOLN COMPARISON:  CT CAP 12/26/2017 07/03/2018; CT CAP FINDINGS: CT CHEST FINDINGS Cardiovascular: Normal heart size. Trace fluid superior pericardial recess. Aorta main pulmonary artery normal in caliber. Coronary arterial vascular calcifications. Mediastinum/Nodes: Stable 1.6 cm right hilar node (image 29; series 2). Stable 1.0 cm precarinal node (image 25; series 2). Normal appearance of the esophagus. Lungs/Pleura: Central airways are patent. Similar-appearing right apical chest wall lesion measuring 13.2 x 3.8 cm (image 18; series 2), previously 13.0 x 3.6 cm. There is associated destruction of the right first, second, third and fourth ribs. Suggestion of tumor filtration into the right axilla, similar. Similar-appearing right upper lobe interstitial opacities which may represent post radiation changes. No pleural effusion or pneumothorax. Musculoskeletal: Similar-appearing destruction of the right first through fourth ribs. CT ABDOMEN PELVIS FINDINGS Hepatobiliary: The liver is normal in size and contour. Gallbladder is unremarkable. No intrahepatic or extrahepatic biliary ductal dilatation. Pancreas: Unremarkable Spleen: Unremarkable Adrenals/Urinary Tract: Normal adrenal glands. Kidneys enhance symmetrically  with contrast. No hydronephrosis. Urinary bladder is unremarkable. Nonobstructing 2 mm stone inferior pole left kidney. Stomach/Bowel: Normal morphology of the stomach. No evidence for bowel obstruction. No free fluid or free intraperitoneal air. Vascular/Lymphatic: Normal caliber abdominal aorta. No retroperitoneal lymphadenopathy. Reproductive: Heterogeneous prostate. Other: None. Musculoskeletal: Lumbar spine degenerative changes. No aggressive or acute appearing osseous  lesions. IMPRESSION: 1. Similar-appearing right apical chest wall mass with associated destruction of the right first through fourth ribs. 2. Stable mediastinal and right hilar adenopathy. Electronically Signed   By: Lovey Newcomer M.D.   On: 01/03/2019 16:56   Ct Abdomen Pelvis W Contrast  Result Date: 01/03/2019 CLINICAL DATA:  Patient with history of lung cancer. Restaging exam. EXAM: CT CHEST, ABDOMEN, AND PELVIS WITH CONTRAST TECHNIQUE: Multidetector CT imaging of the chest, abdomen and pelvis was performed following the standard protocol during bolus administration of intravenous contrast. CONTRAST:  136mL OMNIPAQUE IOHEXOL 300 MG/ML  SOLN COMPARISON:  CT CAP 12/26/2017 07/03/2018; CT CAP FINDINGS: CT CHEST FINDINGS Cardiovascular: Normal heart size. Trace fluid superior pericardial recess. Aorta main pulmonary artery normal in caliber. Coronary arterial vascular calcifications. Mediastinum/Nodes: Stable 1.6 cm right hilar node (image 29; series 2). Stable 1.0 cm precarinal node (image 25; series 2). Normal appearance of the esophagus. Lungs/Pleura: Central airways are patent. Similar-appearing right apical chest wall lesion measuring 13.2 x 3.8 cm (image 18; series 2), previously 13.0 x 3.6 cm. There is associated destruction of the right first, second, third and fourth ribs. Suggestion of tumor filtration into the right axilla, similar. Similar-appearing right upper lobe interstitial opacities which may represent post radiation changes. No pleural effusion or pneumothorax. Musculoskeletal: Similar-appearing destruction of the right first through fourth ribs. CT ABDOMEN PELVIS FINDINGS Hepatobiliary: The liver is normal in size and contour. Gallbladder is unremarkable. No intrahepatic or extrahepatic biliary ductal dilatation. Pancreas: Unremarkable Spleen: Unremarkable Adrenals/Urinary Tract: Normal adrenal glands. Kidneys enhance symmetrically with contrast. No hydronephrosis. Urinary bladder is  unremarkable. Nonobstructing 2 mm stone inferior pole left kidney. Stomach/Bowel: Normal morphology of the stomach. No evidence for bowel obstruction. No free fluid or free intraperitoneal air. Vascular/Lymphatic: Normal caliber abdominal aorta. No retroperitoneal lymphadenopathy. Reproductive: Heterogeneous prostate. Other: None. Musculoskeletal: Lumbar spine degenerative changes. No aggressive or acute appearing osseous lesions. IMPRESSION: 1. Similar-appearing right apical chest wall mass with associated destruction of the right first through fourth ribs. 2. Stable mediastinal and right hilar adenopathy. Electronically Signed   By: Lovey Newcomer M.D.   On: 01/03/2019 16:56     ASSESSMENT/PLAN:  This is a very pleasant 58 year old African-American male with a stage IV non-small cell lung cancer, squamous cell carcinoma.  He presented with a right upper lobe lung mass with chest wall invasion and erosion into his 1st-4th ribs.  He was diagnosed in August 2017.  The patient underwent systemic chemotherapy with carboplatin and paclitaxel.  He is status post 5 cycles.  He tolerated treatment well but was lost to follow-up after cycle #5.  The patient has been on observation since 2017.  The patient continues to feel well without any complaints.  The patient recently had a restaging CT scan performed.  Dr. Julien Nordmann personally and independently reviewed the scan and discussed the results with the patient today.  The scan did not show any evidence of disease progression and re-demonstrated a similar appearing right apical chest wall mass with associated destruction of the right first through fourth rib.  Dr. Julien Nordmann recommend that the patient continue on observation.  I will arrange  for the patient to have a restaging CT scan of the chest, abdomen, and pelvis in 6 months.  We will see the patient back for follow-up visit in 6 months for evaluation and to review his scan results at that time.  The patient was  advised to call immediately if he has any concerning symptoms in the interval. The patient voices understanding of current disease status and treatment options and is in agreement with the current care plan. All questions were answered. The patient knows to call the clinic with any problems, questions or concerns. We can certainly see the patient much sooner if necessary   Orders Placed This Encounter  Procedures  . CT Chest W Contrast    Standing Status:   Future    Standing Expiration Date:   01/16/2020    Order Specific Question:   ** REASON FOR EXAM (FREE TEXT)    Answer:   restaging lung cancer    Order Specific Question:   If indicated for the ordered procedure, I authorize the administration of contrast media per Radiology protocol    Answer:   Yes    Order Specific Question:   Preferred imaging location?    Answer:   [facility]    Order Specific Question:   Radiology Contrast Protocol - do NOT remove file path    Answer:   \\[email or web address]  . CT Abdomen Pelvis W Contrast    Standing Status:   Future    Standing Expiration Date:   01/16/2020    Order Specific Question:   ** REASON FOR EXAM (FREE TEXT)    Answer:   Restaging Lung Cancer    Order Specific Question:   If indicated for the ordered procedure, I authorize the administration of contrast media per Radiology protocol    Answer:   Yes    Order Specific Question:   Preferred imaging location?    Answer:   [facility]    Order Specific Question:   Is Oral Contrast requested for this exam?    Answer:   Yes, Per Radiology protocol    Order Specific Question:   Radiology Contrast Protocol - do NOT remove file path    Answer:   \\[email or web address]  . CBC with Differential ([facility] Only)    Standing Status:   Future    Standing Expiration Date:   01/16/2020  . CMP ([facility] only)    Standing Status:   Future    Standing Expiration Date:    01/16/2020     Tobe Sos Heilingoetter, PA-C 01/16/19  ADDENDUM: Hematology/Oncology Attending: I had a face-to-face encounter with the patient today.  I recommended his care plan.  This is a very pleasant 58 years old African-American male with a stage IV non-small cell lung cancer, squamous cell carcinoma presented with right upper lobe lung mass and chest wall invasion as well as erosion into the ribs from 1-4 diagnosed in August 2017 status post 5 cycles of systemic chemotherapy and he was lost to follow-up after that time and currently on observation. He had repeat CT scan of the chest, abdomen pelvis performed recently.  I personally and independently reviewed the scan images and discussed the results with the patient and his daughter. Has a scan showed no concerning findings for disease progression. I recommended for the patient to continue on observation with repeat CT scan of the chest, abdomen pelvis in 6 months. The patient was advised to call immediately if he has any concerning  symptoms in the interval.  Disclaimer: This note was dictated with voice recognition software. Similar sounding words can inadvertently be transcribed and may be missed upon review. Eilleen Kempf, MD 01/16/19

## 2019-01-17 ENCOUNTER — Telehealth: Payer: Self-pay | Admitting: Physician Assistant

## 2019-01-17 NOTE — Telephone Encounter (Signed)
Scheduled appt per 10/21 los.  Sent a staff message to get a calendar mailed out.

## 2019-02-05 DIAGNOSIS — F250 Schizoaffective disorder, bipolar type: Secondary | ICD-10-CM | POA: Diagnosis not present

## 2019-02-05 DIAGNOSIS — Z79899 Other long term (current) drug therapy: Secondary | ICD-10-CM | POA: Diagnosis not present

## 2019-07-17 ENCOUNTER — Telehealth: Payer: Self-pay | Admitting: Medical Oncology

## 2019-07-17 ENCOUNTER — Inpatient Hospital Stay: Payer: Medicare Other | Attending: Internal Medicine

## 2019-07-17 ENCOUNTER — Ambulatory Visit (HOSPITAL_COMMUNITY): Admission: RE | Admit: 2019-07-17 | Payer: Medicare Other | Source: Ambulatory Visit

## 2019-07-17 DIAGNOSIS — Z9221 Personal history of antineoplastic chemotherapy: Secondary | ICD-10-CM | POA: Insufficient documentation

## 2019-07-17 DIAGNOSIS — Z85118 Personal history of other malignant neoplasm of bronchus and lung: Secondary | ICD-10-CM | POA: Insufficient documentation

## 2019-07-17 DIAGNOSIS — Z923 Personal history of irradiation: Secondary | ICD-10-CM | POA: Insufficient documentation

## 2019-07-17 NOTE — Telephone Encounter (Signed)
[name] is going to r/s pt CT scan and lab. Keep f/u as is.

## 2019-07-19 ENCOUNTER — Inpatient Hospital Stay: Payer: Medicare Other

## 2019-07-19 ENCOUNTER — Encounter (HOSPITAL_COMMUNITY): Payer: Self-pay

## 2019-07-19 ENCOUNTER — Ambulatory Visit (HOSPITAL_COMMUNITY)
Admission: RE | Admit: 2019-07-19 | Discharge: 2019-07-19 | Disposition: A | Discharge status: Home / Self Care | Payer: Medicare Other | Source: Ambulatory Visit | Attending: Physician Assistant | Admitting: Physician Assistant

## 2019-07-19 ENCOUNTER — Ambulatory Visit: Payer: Medicare Other | Admitting: Hematology and Oncology

## 2019-07-19 ENCOUNTER — Encounter (INDEPENDENT_AMBULATORY_CARE_PROVIDER_SITE_OTHER): Payer: Self-pay

## 2019-07-19 ENCOUNTER — Other Ambulatory Visit: Payer: Self-pay

## 2019-07-19 DIAGNOSIS — C3411 Malignant neoplasm of upper lobe, right bronchus or lung: Secondary | ICD-10-CM

## 2019-07-19 DIAGNOSIS — Z923 Personal history of irradiation: Secondary | ICD-10-CM | POA: Diagnosis not present

## 2019-07-19 DIAGNOSIS — Z85118 Personal history of other malignant neoplasm of bronchus and lung: Secondary | ICD-10-CM | POA: Diagnosis present

## 2019-07-19 DIAGNOSIS — Z9221 Personal history of antineoplastic chemotherapy: Secondary | ICD-10-CM | POA: Diagnosis not present

## 2019-07-19 LAB — CBC WITH DIFFERENTIAL (CANCER CENTER ONLY)
Abs Immature Granulocytes: 0 10*3/uL (ref 0.00–0.07)
Basophils Absolute: 0 10*3/uL (ref 0.0–0.1)
Basophils Relative: 1 %
Eosinophils Absolute: 0 10*3/uL (ref 0.0–0.5)
Eosinophils Relative: 1 %
HCT: 41 % (ref 39.0–52.0)
Hemoglobin: 13.6 g/dL (ref 13.0–17.0)
Immature Granulocytes: 0 %
Lymphocytes Relative: 27 %
Lymphs Abs: 1.1 10*3/uL (ref 0.7–4.0)
MCH: 31.1 pg (ref 26.0–34.0)
MCHC: 33.2 g/dL (ref 30.0–36.0)
MCV: 93.8 fL (ref 80.0–100.0)
Monocytes Absolute: 0.4 10*3/uL (ref 0.1–1.0)
Monocytes Relative: 10 %
Neutro Abs: 2.4 10*3/uL (ref 1.7–7.7)
Neutrophils Relative %: 61 %
Platelet Count: 228 10*3/uL (ref 150–400)
RBC: 4.37 MIL/uL (ref 4.22–5.81)
RDW: 13.2 % (ref 11.5–15.5)
WBC Count: 3.9 10*3/uL — ABNORMAL LOW (ref 4.0–10.5)
nRBC: 0 % (ref 0.0–0.2)

## 2019-07-19 LAB — CMP (CANCER CENTER ONLY)
ALT: 12 U/L (ref 0–44)
AST: 16 U/L (ref 15–41)
Albumin: 4.1 g/dL (ref 3.5–5.0)
Alkaline Phosphatase: 100 U/L (ref 38–126)
Anion gap: 8 (ref 5–15)
BUN: 13 mg/dL (ref 6–20)
CO2: 26 mmol/L (ref 22–32)
Calcium: 9.1 mg/dL (ref 8.9–10.3)
Chloride: 105 mmol/L (ref 98–111)
Creatinine: 1.06 mg/dL (ref 0.61–1.24)
GFR, Est AFR Am: >60 mL/min (ref >60)
GFR, Est Non Af Am: >60 mL/min (ref >60)
Glucose, Bld: 84 mg/dL (ref 70–99)
Potassium: 4.3 mmol/L (ref 3.5–5.1)
Sodium: 139 mmol/L (ref 135–145)
Total Bilirubin: 1 mg/dL (ref 0.3–1.2)
Total Protein: 7.5 g/dL (ref 6.5–8.1)

## 2019-07-19 MED ORDER — IOHEXOL 300 MG/ML  SOLN
100.0000 mL | Freq: Once | INTRAMUSCULAR | Status: AC | PRN
  Administered 2019-07-19: 100 mL via INTRAVENOUS

## 2019-07-19 MED ORDER — SODIUM CHLORIDE (PF) 0.9 % IJ SOLN
INTRAMUSCULAR | Status: AC
  Filled 2019-07-19: qty 50

## 2019-07-22 ENCOUNTER — Inpatient Hospital Stay: Payer: Medicare Other | Admitting: Internal Medicine

## 2019-07-22 NOTE — Telephone Encounter (Signed)
Keith Murray could not find pt today . She went by house and all his lights were on ,but he was not there. Schedule message sent to r/s .

## 2019-07-24 ENCOUNTER — Other Ambulatory Visit: Payer: Self-pay

## 2019-07-24 ENCOUNTER — Inpatient Hospital Stay (HOSPITAL_BASED_OUTPATIENT_CLINIC_OR_DEPARTMENT_OTHER): Payer: Medicare Other | Admitting: Internal Medicine

## 2019-07-24 ENCOUNTER — Encounter: Payer: Self-pay | Admitting: Internal Medicine

## 2019-07-24 ENCOUNTER — Encounter: Payer: Self-pay | Admitting: *Deleted

## 2019-07-24 ENCOUNTER — Telehealth: Payer: Self-pay | Admitting: Internal Medicine

## 2019-07-24 VITALS — BP 136/87 | HR 68 | Temp 98.9°F | Resp 18 | Ht 73.0 in | Wt 150.8 lb

## 2019-07-24 DIAGNOSIS — C3411 Malignant neoplasm of upper lobe, right bronchus or lung: Secondary | ICD-10-CM | POA: Diagnosis not present

## 2019-07-24 DIAGNOSIS — C3490 Malignant neoplasm of unspecified part of unspecified bronchus or lung: Secondary | ICD-10-CM

## 2019-07-24 DIAGNOSIS — Z85118 Personal history of other malignant neoplasm of bronchus and lung: Secondary | ICD-10-CM | POA: Diagnosis not present

## 2019-07-24 NOTE — Progress Notes (Signed)
Oncology Nurse Navigator Documentation  Oncology Nurse Navigator Flowsheets 07/24/2019  Navigator Location [facility]  Navigator Encounter Type Clinic/MDC  Patient Visit Type MedOnc/spoke with Keith Murray and support person today at clinic.  He is doing well without complaints.  His plan of care is follow up in 6 months and he verbalized understanding.   Treatment Phase Post-Tx Follow-up  Barriers/Navigation Needs Education  Education Other  Interventions Education  Acuity Level 2-Minimal Needs (1-2 Barriers Identified)  Coordination of Care -  Education Method Verbal  Time Spent with Patient 15

## 2019-07-24 NOTE — Progress Notes (Signed)
[facility] Telephone:([phone]) [phone]   [phone]) [phone]  OFFICE PROGRESS NOTE  Benito Mccreedy, MD [place] [address]  DIAGNOSIS: Stage IV (T3, N2, M1 B) non-small cell lung cancer, squamous cell carcinoma with PDL 1 expression of 25% presented with massive right upper lobe lung mass as well as mediastinal lymphadenopathy and subcutaneous metastatic disease diagnosed in August 2017.  PRIOR THERAPY: 1) Palliative radiotherapy to the right lung and abdominal wall mass under the care of Dr. Lisbeth Renshaw completed on 11/20/2015. 2) Systemic chemotherapy with carboplatin for AUC of 5 and paclitaxel 175 MG/M2 every 3 weeks with Neulasta support. Status post 5 cycles. First dose was given on 12/17/2015.    CURRENT THERAPY: Observation.  INTERVAL HISTORY: Keith Murray 59 y.o. male returns to the clinic today for follow-up visit accompanied by his daughter.  The patient is feeling fine today with no concerning complaints.  He denied having any current chest pain, shortness of breath, cough or hemoptysis.  He denied having any fever or chills.  He has no nausea, vomiting, diarrhea or constipation.  He has no headache or visual changes.  He is currently on observation.  He had repeat CT scan of the chest, abdomen and pelvis performed recently and he is here today for evaluation and discussion of his discuss results.   MEDICAL HISTORY: Past Medical History:  Diagnosis Date  . Anxiety   . met lung ca dx'd 10/2015   lung  . Polysubstance abuse ([facility])    cocaine, etoh, marijuana  . Psychosis ([facility])   . Schizoaffective disorder ([facility])     ALLERGIES:  has No Known Allergies.  MEDICATIONS:  Current Outpatient Medications  Medication Sig Dispense Refill  . benztropine (COGENTIN) 0.5 MG tablet Take 1 tablet (0.5 mg total) by mouth 2 (two) times daily. 60 tablet 0  . FLUoxetine (PROZAC) 20 MG capsule Take 1 capsule (20 mg total) by mouth daily. 30 capsule 0  .  haloperidol (HALDOL) 5 MG tablet Take 1 tablet (5 mg total) by mouth 2 (two) times daily. 30 tablet 0  . hydrOXYzine (ATARAX/VISTARIL) 25 MG tablet Take 1 tablet (25 mg total) by mouth every 6 (six) hours as needed for anxiety. 30 tablet 0  . traZODone (DESYREL) 100 MG tablet Take 1 tablet (100 mg total) by mouth at bedtime. 30 tablet 0   No current facility-administered medications for this visit.    SURGICAL HISTORY:  Past Surgical History:  Procedure Laterality Date  . HERNIA REPAIR      REVIEW OF SYSTEMS:  A comprehensive review of systems was negative.   PHYSICAL EXAMINATION: General appearance: alert, cooperative and no distress Head: Normocephalic, without obvious abnormality, atraumatic Neck: no adenopathy, no JVD, supple, symmetrical, trachea midline and thyroid not enlarged, symmetric, no tenderness/mass/nodules Lymph nodes: Cervical, supraclavicular, and axillary nodes normal. Resp: clear to auscultation bilaterally Back: symmetric, no curvature. ROM normal. No CVA tenderness. Cardio: regular rate and rhythm, S1, S2 normal, no murmur, click, rub or gallop GI: soft, non-tender; bowel sounds normal; no masses,  no organomegaly Extremities: extremities normal, atraumatic, no cyanosis or edema  ECOG PERFORMANCE STATUS: 1 - Symptomatic but completely ambulatory  Blood pressure 136/87, pulse 68, temperature 98.9 F (37.2 C), temperature source Temporal, resp. rate 18, height '6\' 1"'  (1.854 m), weight 150 lb 12.8 oz (68.4 kg), SpO2 100 %.  LABORATORY DATA: Lab Results  Component Value Date   WBC 3.9 (L) 07/19/2019   HGB 13.6 07/19/2019  HCT 41.0 07/19/2019   MCV 93.8 07/19/2019   PLT 228 07/19/2019      Chemistry      Component Value Date/Time   NA 139 07/19/2019 1217   NA 140 05/04/2016 1004   K 4.3 07/19/2019 1217   K 4.5 05/04/2016 1004   CL 105 07/19/2019 1217   CO2 26 07/19/2019 1217   CO2 24 05/04/2016 1004   BUN 13 07/19/2019 1217   BUN 10.4 05/04/2016  1004   CREATININE 1.06 07/19/2019 1217   CREATININE 1.0 05/04/2016 1004      Component Value Date/Time   CALCIUM 9.1 07/19/2019 1217   CALCIUM 9.5 05/04/2016 1004   ALKPHOS 100 07/19/2019 1217   ALKPHOS 145 05/04/2016 1004   AST 16 07/19/2019 1217   AST 12 05/04/2016 1004   ALT 12 07/19/2019 1217   ALT 8 05/04/2016 1004   BILITOT 1.0 07/19/2019 1217   BILITOT 0.69 05/04/2016 1004       RADIOGRAPHIC STUDIES: CT Chest W Contrast  Result Date: 07/20/2019 CLINICAL DATA:  Follow-up right upper lobe lung carcinoma. Previous chemotherapy and radiation therapy. EXAM: CT CHEST, ABDOMEN, AND PELVIS WITH CONTRAST TECHNIQUE: Multidetector CT imaging of the chest, abdomen and pelvis was performed following the standard protocol during bolus administration of intravenous contrast. CONTRAST:  164m OMNIPAQUE IOHEXOL 300 MG/ML  SOLN COMPARISON:  01/03/2019 FINDINGS: CT CHEST FINDINGS Cardiovascular: No acute findings. Coronary artery atherosclerosis. Mediastinum/Lymph Nodes: 1.8 cm right suprahilar lymph node remains stable. No other pathologically enlarged lymph nodes identified. Lungs/Pleura: Stable post radiation changes seen in the right upper lung field. No evidence of acute infiltrate or pleural effusion. No suspicious pulmonary nodules or masses identified. Musculoskeletal: Asymmetric soft tissue density in the right upper lateral chest wall remains stable with associated destruction of the right lateral 1st through 4th ribs. No other suspicious bone lesions identified. CT ABDOMEN AND PELVIS FINDINGS Hepatobiliary: No masses identified. Gallbladder is unremarkable. No evidence of biliary ductal dilatation. Pancreas:  No mass or inflammatory changes. Spleen:  Within normal limits in size and appearance. Adrenals/Urinary tract:  No masses or hydronephrosis. Stomach/Bowel: No evidence of obstruction, inflammatory process, or abnormal fluid collections. Vascular/Lymphatic: No pathologically enlarged lymph  nodes identified. No abdominal aortic aneurysm. Aortic atherosclerosis incidentally noted. Reproductive:  No mass or other significant abnormality identified. Other:  None. Musculoskeletal:  No suspicious bone lesions identified. IMPRESSION: 1. Stable right upper lateral chest wall soft tissue density and associated destruction of right lateral 1st through 4th ribs. 2. Stable mild right suprahilar lymphadenopathy. 3. No new or progressive metastatic disease within the chest, abdomen, or pelvis. Electronically Signed   By: JMarlaine HindM.D.   On: 07/20/2019 16:37   CT Abdomen Pelvis W Contrast  Result Date: 07/20/2019 CLINICAL DATA:  Follow-up right upper lobe lung carcinoma. Previous chemotherapy and radiation therapy. EXAM: CT CHEST, ABDOMEN, AND PELVIS WITH CONTRAST TECHNIQUE: Multidetector CT imaging of the chest, abdomen and pelvis was performed following the standard protocol during bolus administration of intravenous contrast. CONTRAST:  1011mOMNIPAQUE IOHEXOL 300 MG/ML  SOLN COMPARISON:  01/03/2019 FINDINGS: CT CHEST FINDINGS Cardiovascular: No acute findings. Coronary artery atherosclerosis. Mediastinum/Lymph Nodes: 1.8 cm right suprahilar lymph node remains stable. No other pathologically enlarged lymph nodes identified. Lungs/Pleura: Stable post radiation changes seen in the right upper lung field. No evidence of acute infiltrate or pleural effusion. No suspicious pulmonary nodules or masses identified. Musculoskeletal: Asymmetric soft tissue density in the right upper lateral chest wall remains stable with associated destruction  of the right lateral 1st through 4th ribs. No other suspicious bone lesions identified. CT ABDOMEN AND PELVIS FINDINGS Hepatobiliary: No masses identified. Gallbladder is unremarkable. No evidence of biliary ductal dilatation. Pancreas:  No mass or inflammatory changes. Spleen:  Within normal limits in size and appearance. Adrenals/Urinary tract:  No masses or hydronephrosis.  Stomach/Bowel: No evidence of obstruction, inflammatory process, or abnormal fluid collections. Vascular/Lymphatic: No pathologically enlarged lymph nodes identified. No abdominal aortic aneurysm. Aortic atherosclerosis incidentally noted. Reproductive:  No mass or other significant abnormality identified. Other:  None. Musculoskeletal:  No suspicious bone lesions identified. IMPRESSION: 1. Stable right upper lateral chest wall soft tissue density and associated destruction of right lateral 1st through 4th ribs. 2. Stable mild right suprahilar lymphadenopathy. 3. No new or progressive metastatic disease within the chest, abdomen, or pelvis. Electronically Signed   By: Marlaine Hind M.D.   On: 07/20/2019 16:37    ASSESSMENT AND PLAN: This is a very pleasant 59 years old African-American male with a stage IV non-small cell lung cancer, squamous cell carcinoma. The patient underwent systemic chemotherapy with carboplatin and paclitaxel is status post 5 cycles.  The patient tolerated his treatment well but he was lost to follow-up after cycle #5. He has been in observation since that time.  The patient is feeling fine today with no concerning complaints. He had repeat CT scan of the chest, abdomen pelvis performed recently.  I personally and independently reviewed the scan and discussed the results with the patient and his daughter. His scan showed no concerning findings for disease progression. I recommended for the patient to continue on observation with repeat CT scan of the chest, abdomen and pelvis in 6 months. The patient was advised to call immediately if he has any concerning symptoms in the interval. The patient voices understanding of current disease status and treatment options and is in agreement with the current care plan. All questions were answered. The patient knows to call the clinic with any problems, questions or concerns. We can certainly see the patient much sooner if necessary.   Disclaimer: This note was dictated with voice recognition software. Similar sounding words can inadvertently be transcribed and may not be corrected upon review.

## 2019-07-24 NOTE — Telephone Encounter (Signed)
Scheduled appts per 4/28 los. Appt date and time were confirmed.

## 2019-12-31 ENCOUNTER — Telehealth: Payer: Self-pay | Admitting: Internal Medicine

## 2019-12-31 NOTE — Telephone Encounter (Signed)
Moved 10/28 appt to earlier time due to provider PAL. Called and left msg. Mailed printout

## 2020-01-07 ENCOUNTER — Emergency Department (HOSPITAL_COMMUNITY): Payer: Medicare Other

## 2020-01-07 ENCOUNTER — Emergency Department (HOSPITAL_COMMUNITY)
Admission: EM | Admit: 2020-01-07 | Discharge: 2020-01-07 | Disposition: A | Discharge status: Home / Self Care | Payer: Medicare Other | Attending: Emergency Medicine | Admitting: Emergency Medicine

## 2020-01-07 ENCOUNTER — Encounter (HOSPITAL_COMMUNITY): Payer: Self-pay | Admitting: Emergency Medicine

## 2020-01-07 DIAGNOSIS — K0381 Cracked tooth: Secondary | ICD-10-CM | POA: Diagnosis not present

## 2020-01-07 DIAGNOSIS — F17200 Nicotine dependence, unspecified, uncomplicated: Secondary | ICD-10-CM | POA: Insufficient documentation

## 2020-01-07 DIAGNOSIS — S0992XA Unspecified injury of nose, initial encounter: Secondary | ICD-10-CM | POA: Diagnosis present

## 2020-01-07 DIAGNOSIS — R0789 Other chest pain: Secondary | ICD-10-CM | POA: Insufficient documentation

## 2020-01-07 DIAGNOSIS — K029 Dental caries, unspecified: Secondary | ICD-10-CM | POA: Insufficient documentation

## 2020-01-07 DIAGNOSIS — S022XXA Fracture of nasal bones, initial encounter for closed fracture: Secondary | ICD-10-CM | POA: Diagnosis not present
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
## 2020-01-07 NOTE — ED Notes (Signed)
An After Visit Summary was printed and given to the patient. Discharge instructions given and no further questions at this time.  
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
## 2020-01-21 ENCOUNTER — Inpatient Hospital Stay: Payer: Medicare Other

## 2020-01-22 ENCOUNTER — Encounter (HOSPITAL_COMMUNITY): Payer: Self-pay

## 2020-01-22 ENCOUNTER — Ambulatory Visit (HOSPITAL_COMMUNITY)
Admission: RE | Admit: 2020-01-22 | Discharge: 2020-01-22 | Disposition: A | Discharge status: Home / Self Care | Payer: Medicare Other | Source: Ambulatory Visit | Attending: Internal Medicine | Admitting: Internal Medicine

## 2020-01-22 ENCOUNTER — Other Ambulatory Visit: Payer: Self-pay

## 2020-01-22 DIAGNOSIS — C3490 Malignant neoplasm of unspecified part of unspecified bronchus or lung: Secondary | ICD-10-CM

## 2020-01-22 MED ORDER — IOHEXOL 300 MG/ML  SOLN
100.0000 mL | Freq: Once | INTRAMUSCULAR | Status: AC | PRN
  Administered 2020-01-22: 100 mL via INTRAVENOUS

## 2020-01-23 ENCOUNTER — Inpatient Hospital Stay (HOSPITAL_BASED_OUTPATIENT_CLINIC_OR_DEPARTMENT_OTHER): Payer: Medicare Other | Admitting: Internal Medicine

## 2020-01-23 ENCOUNTER — Inpatient Hospital Stay: Payer: Medicare Other | Attending: Internal Medicine

## 2020-01-23 ENCOUNTER — Encounter: Payer: Self-pay | Admitting: Internal Medicine

## 2020-01-23 ENCOUNTER — Inpatient Hospital Stay: Payer: Medicare Other | Admitting: Internal Medicine

## 2020-01-23 ENCOUNTER — Other Ambulatory Visit: Payer: Self-pay

## 2020-01-23 VITALS — BP 123/86 | HR 67 | Temp 97.0°F | Resp 18 | Ht 73.0 in | Wt 152.0 lb

## 2020-01-23 DIAGNOSIS — C3490 Malignant neoplasm of unspecified part of unspecified bronchus or lung: Secondary | ICD-10-CM

## 2020-01-23 DIAGNOSIS — Z9221 Personal history of antineoplastic chemotherapy: Secondary | ICD-10-CM | POA: Diagnosis not present

## 2020-01-23 DIAGNOSIS — Z85118 Personal history of other malignant neoplasm of bronchus and lung: Secondary | ICD-10-CM | POA: Insufficient documentation

## 2020-01-23 DIAGNOSIS — Z923 Personal history of irradiation: Secondary | ICD-10-CM | POA: Diagnosis not present

## 2020-01-23 DIAGNOSIS — C3411 Malignant neoplasm of upper lobe, right bronchus or lung: Secondary | ICD-10-CM | POA: Diagnosis not present

## 2020-01-23 LAB — CBC WITH DIFFERENTIAL (CANCER CENTER ONLY)
Abs Immature Granulocytes: 0.01 10*3/uL (ref 0.00–0.07)
Basophils Absolute: 0 10*3/uL (ref 0.0–0.1)
Basophils Relative: 0 %
Eosinophils Absolute: 0 10*3/uL (ref 0.0–0.5)
Eosinophils Relative: 1 %
HCT: 42.8 % (ref 39.0–52.0)
Hemoglobin: 14.1 g/dL (ref 13.0–17.0)
Immature Granulocytes: 0 %
Lymphocytes Relative: 31 %
Lymphs Abs: 1.5 10*3/uL (ref 0.7–4.0)
MCH: 30.9 pg (ref 26.0–34.0)
MCHC: 32.9 g/dL (ref 30.0–36.0)
MCV: 93.7 fL (ref 80.0–100.0)
Monocytes Absolute: 0.5 10*3/uL (ref 0.1–1.0)
Monocytes Relative: 9 %
Neutro Abs: 2.8 10*3/uL (ref 1.7–7.7)
Neutrophils Relative %: 59 %
Platelet Count: 236 10*3/uL (ref 150–400)
RBC: 4.57 MIL/uL (ref 4.22–5.81)
RDW: 14 % (ref 11.5–15.5)
WBC Count: 4.9 10*3/uL (ref 4.0–10.5)
nRBC: 0 % (ref 0.0–0.2)

## 2020-01-23 LAB — CMP (CANCER CENTER ONLY)
ALT: 11 U/L (ref 0–44)
AST: 13 U/L — ABNORMAL LOW (ref 15–41)
Albumin: 3.8 g/dL (ref 3.5–5.0)
Alkaline Phosphatase: 117 U/L (ref 38–126)
Anion gap: 7 (ref 5–15)
BUN: 8 mg/dL (ref 6–20)
CO2: 26 mmol/L (ref 22–32)
Calcium: 9.4 mg/dL (ref 8.9–10.3)
Chloride: 108 mmol/L (ref 98–111)
Creatinine: 0.97 mg/dL (ref 0.61–1.24)
GFR, Estimated: >60 mL/min (ref >60)
Glucose, Bld: 86 mg/dL (ref 70–99)
Potassium: 4.2 mmol/L (ref 3.5–5.1)
Sodium: 141 mmol/L (ref 135–145)
Total Bilirubin: 0.6 mg/dL (ref 0.3–1.2)
Total Protein: 7.3 g/dL (ref 6.5–8.1)
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
## 2020-01-28 ENCOUNTER — Telehealth: Payer: Self-pay | Admitting: Internal Medicine

## 2020-01-28 NOTE — Telephone Encounter (Signed)
Scheduled apt per 10/28 los - mailed reminder letter with appt date and time

## 2020-07-21 ENCOUNTER — Inpatient Hospital Stay: Payer: Medicare Other | Attending: Internal Medicine

## 2020-07-21 ENCOUNTER — Ambulatory Visit (HOSPITAL_COMMUNITY): Payer: Medicare Other

## 2020-07-21 NOTE — Progress Notes (Signed)
Notification received from Radiology that pt "no showed" his CT scan appt today 07/21/20.

## 2020-07-23 ENCOUNTER — Inpatient Hospital Stay: Payer: Medicare Other | Admitting: Internal Medicine

## 2021-03-25 ENCOUNTER — Other Ambulatory Visit: Payer: Self-pay

## 2021-03-25 ENCOUNTER — Telehealth: Payer: Self-pay

## 2021-03-25 DIAGNOSIS — C3490 Malignant neoplasm of unspecified part of unspecified bronchus or lung: Secondary | ICD-10-CM

## 2021-03-25 NOTE — Telephone Encounter (Signed)
Pts niece called requesting to sched pts CT scan, labs and follow-up appt. She is aware the pt was last seen 01/23/20 and missed all his  April 2022 follow up appts. She indicates they are now ready to r/s them. I have advise I will send the CT order to the provider to cosign and a schedule message to sched him for labs and a follow-up appt. She expressed understanding of this information and knows to call Centralized scheduling to schedule the CT.

## 2021-03-26 ENCOUNTER — Telehealth: Payer: Self-pay | Admitting: Internal Medicine

## 2021-03-26 NOTE — Telephone Encounter (Signed)
Scheduled per sch msg. Called and left msg  

## 2021-04-05 ENCOUNTER — Inpatient Hospital Stay: Payer: Medicare Other | Attending: Physician Assistant

## 2021-04-05 ENCOUNTER — Other Ambulatory Visit: Payer: Self-pay | Admitting: Physician Assistant

## 2021-04-05 DIAGNOSIS — Z79899 Other long term (current) drug therapy: Secondary | ICD-10-CM | POA: Insufficient documentation

## 2021-04-05 DIAGNOSIS — Z9221 Personal history of antineoplastic chemotherapy: Secondary | ICD-10-CM | POA: Insufficient documentation

## 2021-04-05 DIAGNOSIS — Z923 Personal history of irradiation: Secondary | ICD-10-CM | POA: Insufficient documentation

## 2021-04-05 DIAGNOSIS — Z85118 Personal history of other malignant neoplasm of bronchus and lung: Secondary | ICD-10-CM | POA: Insufficient documentation

## 2021-04-05 DIAGNOSIS — F17210 Nicotine dependence, cigarettes, uncomplicated: Secondary | ICD-10-CM | POA: Insufficient documentation

## 2021-04-05 DIAGNOSIS — C3411 Malignant neoplasm of upper lobe, right bronchus or lung: Secondary | ICD-10-CM

## 2021-04-07 ENCOUNTER — Other Ambulatory Visit: Payer: Self-pay

## 2021-04-07 ENCOUNTER — Ambulatory Visit (HOSPITAL_COMMUNITY)
Admission: RE | Admit: 2021-04-07 | Discharge: 2021-04-07 | Disposition: A | Discharge status: Home / Self Care | Payer: Medicare Other | Source: Ambulatory Visit | Attending: Internal Medicine | Admitting: Internal Medicine

## 2021-04-07 DIAGNOSIS — C3490 Malignant neoplasm of unspecified part of unspecified bronchus or lung: Secondary | ICD-10-CM | POA: Insufficient documentation

## 2021-04-07 LAB — POCT I-STAT CREATININE: Creatinine, Ser: 1 mg/dL (ref 0.61–1.24)

## 2021-04-07 MED ORDER — SODIUM CHLORIDE (PF) 0.9 % IJ SOLN
INTRAMUSCULAR | Status: AC
  Filled 2021-04-07: qty 50

## 2021-04-07 MED ORDER — IOHEXOL 350 MG/ML SOLN
80.0000 mL | Freq: Once | INTRAVENOUS | Status: AC | PRN
  Administered 2021-04-07: 80 mL via INTRAVENOUS

## 2021-04-07 NOTE — Progress Notes (Signed)
[place] OFFICE PROGRESS NOTE  Benito Mccreedy, MD [address] [place] [address]  DIAGNOSIS: Stage IV (T3, N2, M1 B) non-small cell lung cancer, squamous cell carcinoma with PDL 1 expression of 25% presented with massive right upper lobe lung mass as well as mediastinal lymphadenopathy and subcutaneous metastatic disease diagnosed in August 2017.  PRIOR THERAPY: 1) Palliative radiotherapy to the right lung and abdominal wall mass under the care of Dr. Lisbeth Renshaw completed on 11/20/2015. 2) Systemic chemotherapy with carboplatin for AUC of 5 and paclitaxel 175 MG/M2 every 3 weeks with Neulasta support. Status post 5 cycles. First dose was given on 12/17/2015.    CURRENT THERAPY: Observation  INTERVAL HISTORY: Rakeem Colley 61 y.o. male returns to the clinic today for a follow-up visit accompanied by his niece.  The patient was lost to follow-up with treatment after cycle #5.  He then returned to the clinic on 01/23/2020.  It was recommended that he continue on observation with a 44-monthfollow-up visit.  The patient once again got lost to follow-up again after that time.  The patient is feeling fairly well today without any concerning complaints.  He denies any fever, chills, night sweats, or unexplained weight loss.  Denies any chest pain, shortness of breath, cough, or hemoptysis. He still smokes 2-3 cigarettes per week. Denies any nausea, vomiting, diarrhea, or constipation.  Denies any headache or visual changes.  The patient recently had a restaging CT scan performed.  He is here today for evaluation to review his scan results.   MEDICAL HISTORY: Past Medical History:  Diagnosis Date   Anxiety    met lung ca dx'd 10/2015   lung   Polysubstance abuse (HCC)    cocaine, etoh, marijuana   Psychosis ([facility]    Schizoaffective disorder ([facility]     ALLERGIES:  has No Known Allergies.  MEDICATIONS:  Current Outpatient Medications  Medication Sig Dispense Refill    benztropine (COGENTIN) 0.5 MG tablet Take 1 tablet (0.5 mg total) by mouth 2 (two) times daily. 60 tablet 0   FLUoxetine (PROZAC) 20 MG capsule Take 1 capsule (20 mg total) by mouth daily. 30 capsule 0   folic acid (FOLVITE) 1 MG tablet Take 1 mg by mouth daily.     gabapentin (NEURONTIN) 300 MG capsule Take 300 mg by mouth in the morning and at bedtime.     haloperidol (HALDOL) 10 MG tablet Take 10 mg by mouth 2 (two) times daily.     haloperidol decanoate (HALDOL DECANOATE) 100 MG/ML injection Inject into the muscle.     hydrOXYzine (ATARAX/VISTARIL) 25 MG tablet Take 1 tablet (25 mg total) by mouth every 6 (six) hours as needed for anxiety. 30 tablet 0   traZODone (DESYREL) 100 MG tablet Take 1 tablet (100 mg total) by mouth at bedtime. 30 tablet 0   vitamin B-12 (CYANOCOBALAMIN) 1000 MCG tablet Take 1,000 mcg by mouth daily.     No current facility-administered medications for this visit.    SURGICAL HISTORY:  Past Surgical History:  Procedure Laterality Date   HERNIA REPAIR      REVIEW OF SYSTEMS:   Review of Systems  Constitutional: Negative for appetite change, chills, fatigue, fever and unexpected weight change.  HENT:   Negative for mouth sores, nosebleeds, sore throat and trouble swallowing.   Eyes: Negative for eye problems and icterus.  Respiratory: Negative for cough, hemoptysis, shortness of breath and wheezing.   Cardiovascular: Negative for chest pain and leg swelling.  Gastrointestinal: Negative for abdominal pain, constipation, diarrhea, nausea and vomiting.  Genitourinary: Negative for bladder incontinence, difficulty urinating, dysuria, frequency and hematuria.   Musculoskeletal: Negative for back pain, gait problem, neck pain and neck stiffness.  Skin: Negative for itching and rash.  Neurological: Negative for dizziness, extremity weakness, gait problem, headaches, light-headedness and seizures.  Hematological: Negative for adenopathy. Does not bruise/bleed easily.   Psychiatric/Behavioral: Negative for confusion, depression and sleep disturbance. The patient is not nervous/anxious.     PHYSICAL EXAMINATION:  Blood pressure 122/84, pulse 89, temperature 97.7 F (36.5 C), temperature source Axillary, resp. rate 16, height _0  (1.854 m), weight 150 lb 12.8 oz (68.4 kg), SpO2 97 %.  ECOG PERFORMANCE STATUS: 1  Physical Exam  Constitutional: Oriented to person, place, and time and thin appearing male and in no distress. HENT:  Head: Normocephalic and atraumatic.  Mouth/Throat: Oropharynx is clear and moist. No oropharyngeal exudate.  Eyes: Conjunctivae are normal. Right eye exhibits no discharge. Left eye exhibits no discharge. No scleral icterus.  Neck: Normal range of motion. Neck supple.  Cardiovascular: Normal rate, regular rhythm, normal heart sounds and intact distal pulses.   Pulmonary/Chest: Effort normal and breath sounds normal. No respiratory distress. No wheezes. No rales.  Abdominal: Soft. Bowel sounds are normal. Exhibits no distension and no mass. There is no tenderness.  Musculoskeletal: Normal range of motion. Exhibits no edema.  Lymphadenopathy:    No cervical adenopathy.  Neurological: Alert and oriented to person, place, and time. Exhibits normal muscle tone. Gait normal. Coordination normal.  Skin: Skin is warm and dry. No rash noted. Not diaphoretic. No erythema. No pallor.  Psychiatric: Mood, memory and judgment normal.  Vitals reviewed.  LABORATORY DATA: Lab Results  Component Value Date   WBC 4.9 01/23/2020   HGB 14.1 01/23/2020   HCT 42.8 01/23/2020   MCV 93.7 01/23/2020   PLT 236 01/23/2020      Chemistry      Component Value Date/Time   NA 141 01/23/2020 0951   NA 140 05/04/2016 1004   K 4.2 01/23/2020 0951   K 4.5 05/04/2016 1004   CL 108 01/23/2020 0951   CO2 26 01/23/2020 0951   CO2 24 05/04/2016 1004   BUN 8 01/23/2020 0951   BUN 10.4 05/04/2016 1004   CREATININE 1.00 04/07/2021 0852   CREATININE  0.97 01/23/2020 0951   CREATININE 1.0 05/04/2016 1004      Component Value Date/Time   CALCIUM 9.4 01/23/2020 0951   CALCIUM 9.5 05/04/2016 1004   ALKPHOS 117 01/23/2020 0951   ALKPHOS 145 05/04/2016 1004   AST 13 (L) 01/23/2020 0951   AST 12 05/04/2016 1004   ALT 11 01/23/2020 0951   ALT 8 05/04/2016 1004   BILITOT 0.6 01/23/2020 0951   BILITOT 0.69 05/04/2016 1004       RADIOGRAPHIC STUDIES:  CT Chest W Contrast  Result Date: 04/07/2021 CLINICAL DATA:  Primary Cancer Type: Lung Imaging Indication: Routine surveillance Interval therapy since last imaging? No Initial Cancer Diagnosis Date: 11/02/2015; Established by: Biopsy-proven Detailed Pathology: Stage IV non-small cell lung cancer, squamous cell carcinoma. Primary Tumor location: Right upper lobe. Surgeries: Hernia repair. Chemotherapy: Yes; Ongoing? No; Most recent administration: 03/09/2016 Immunotherapy? No Radiation therapy? Yes; Date Range: 11/09/2015 - 11/20/2015; Target: Right lung and abdominal wall mass. EXAM: CT CHEST, ABDOMEN, AND PELVIS WITH CONTRAST TECHNIQUE: Multidetector CT imaging of the chest, abdomen and pelvis was performed following the standard protocol during bolus administration of intravenous contrast. RADIATION DOSE  REDUCTION: This exam was performed according to the departmental dose-optimization program which includes automated exposure control, adjustment of the mA and/or kV according to patient size and/or use of iterative reconstruction technique. CONTRAST:  14m OMNIPAQUE IOHEXOL 350 MG/ML SOLN COMPARISON:  Most recent CT chest, abdomen, and pelvis 01/22/2020. FINDINGS: CT CHEST FINDINGS Cardiovascular: Extensive collateral venous flow in the upper right chest. Coronary artery calcification. Heart size normal. Left ventricle appears dilated. No pericardial effusion. Mediastinum/Nodes: Mediastinal lymph nodes measure up to 11 mm in the low right paratracheal station, similar. Right hilar lymph nodes, up to  1.4 cm, also similar. No left hilar or axillary adenopathy. Esophagus is unremarkable. Lungs/Pleura: Centrilobular and paraseptal emphysema. Extensive calcified pleuroparenchymal thickening in the lateral aspect of the upper right hemithorax, unchanged. Interstitial thickening and coarsening in the apical segment right upper lobe, also similar. Mucoid impaction in the right lower lobe. No new pulmonary nodules. No pleural fluid. Airway is otherwise unremarkable. Musculoskeletal: Radiation related sclerosis and deformity of the right first through fourth ribs. Otherwise, no worrisome lytic or sclerotic lesions. CT ABDOMEN PELVIS FINDINGS Hepatobiliary: Liver and gallbladder are unremarkable. No biliary ductal dilatation. Pancreas: Negative. Spleen: Negative. Adrenals/Urinary Tract: Adrenal glands and right kidney are unremarkable. Tiny stones in the left kidney. Subcentimeter low-attenuation lesion in the left kidney is too small to characterize but statistically, a cyst is likely. Ureters are decompressed. Bladder is grossly unremarkable. Stomach/Bowel: Stomach, small bowel, appendix and colon are unremarkable with exception of stool throughout the colon, suggesting constipation. Vascular/Lymphatic: Atherosclerotic calcification of the aorta. No pathologically enlarged lymph nodes. Reproductive: Prostate is visualized. Other: No free fluid.  Mesenteries and peritoneum are unremarkable. Musculoskeletal: Degenerative changes in the spine and hips. No worrisome lytic or sclerotic lesions. IMPRESSION: 1. Post treatment changes in the upper right hemithorax. Stable borderline mediastinal and right hilar adenopathy. No evidence of distant metastatic disease. 2. Tiny left renal stones. 3. Aortic atherosclerosis (ICD10-I70.0). Coronary artery calcification. Left ventricle appears dilated. Electronically Signed   By: MLorin PicketM.D.   On: 04/07/2021 10:30   CT Abdomen Pelvis W Contrast  Result Date:  04/07/2021 CLINICAL DATA:  Primary Cancer Type: Lung Imaging Indication: Routine surveillance Interval therapy since last imaging? No Initial Cancer Diagnosis Date: 11/02/2015; Established by: Biopsy-proven Detailed Pathology: Stage IV non-small cell lung cancer, squamous cell carcinoma. Primary Tumor location: Right upper lobe. Surgeries: Hernia repair. Chemotherapy: Yes; Ongoing? No; Most recent administration: 03/09/2016 Immunotherapy? No Radiation therapy? Yes; Date Range: 11/09/2015 - 11/20/2015; Target: Right lung and abdominal wall mass. EXAM: CT CHEST, ABDOMEN, AND PELVIS WITH CONTRAST TECHNIQUE: Multidetector CT imaging of the chest, abdomen and pelvis was performed following the standard protocol during bolus administration of intravenous contrast. RADIATION DOSE REDUCTION: This exam was performed according to the departmental dose-optimization program which includes automated exposure control, adjustment of the mA and/or kV according to patient size and/or use of iterative reconstruction technique. CONTRAST:  846mOMNIPAQUE IOHEXOL 350 MG/ML SOLN COMPARISON:  Most recent CT chest, abdomen, and pelvis 01/22/2020. FINDINGS: CT CHEST FINDINGS Cardiovascular: Extensive collateral venous flow in the upper right chest. Coronary artery calcification. Heart size normal. Left ventricle appears dilated. No pericardial effusion. Mediastinum/Nodes: Mediastinal lymph nodes measure up to 11 mm in the low right paratracheal station, similar. Right hilar lymph nodes, up to 1.4 cm, also similar. No left hilar or axillary adenopathy. Esophagus is unremarkable. Lungs/Pleura: Centrilobular and paraseptal emphysema. Extensive calcified pleuroparenchymal thickening in the lateral aspect of the upper right hemithorax, unchanged. Interstitial thickening and coarsening  in the apical segment right upper lobe, also similar. Mucoid impaction in the right lower lobe. No new pulmonary nodules. No pleural fluid. Airway is otherwise  unremarkable. Musculoskeletal: Radiation related sclerosis and deformity of the right first through fourth ribs. Otherwise, no worrisome lytic or sclerotic lesions. CT ABDOMEN PELVIS FINDINGS Hepatobiliary: Liver and gallbladder are unremarkable. No biliary ductal dilatation. Pancreas: Negative. Spleen: Negative. Adrenals/Urinary Tract: Adrenal glands and right kidney are unremarkable. Tiny stones in the left kidney. Subcentimeter low-attenuation lesion in the left kidney is too small to characterize but statistically, a cyst is likely. Ureters are decompressed. Bladder is grossly unremarkable. Stomach/Bowel: Stomach, small bowel, appendix and colon are unremarkable with exception of stool throughout the colon, suggesting constipation. Vascular/Lymphatic: Atherosclerotic calcification of the aorta. No pathologically enlarged lymph nodes. Reproductive: Prostate is visualized. Other: No free fluid.  Mesenteries and peritoneum are unremarkable. Musculoskeletal: Degenerative changes in the spine and hips. No worrisome lytic or sclerotic lesions. IMPRESSION: 1. Post treatment changes in the upper right hemithorax. Stable borderline mediastinal and right hilar adenopathy. No evidence of distant metastatic disease. 2. Tiny left renal stones. 3. Aortic atherosclerosis (ICD10-I70.0). Coronary artery calcification. Left ventricle appears dilated. Electronically Signed   By: Lorin Picket M.D.   On: 04/07/2021 10:30     ASSESSMENT/PLAN:  This is a very pleasant 61 year old African-American male with a stage IV non-small cell lung cancer, squamous cell carcinoma.  He presented with a right upper lobe lung mass with chest wall invasion and erosion into his 1st-4th ribs.  He was diagnosed in August 2017.   The patient underwent systemic chemotherapy with carboplatin and paclitaxel.  He is status post 5 cycles.  He tolerated treatment well but was lost to follow-up after cycle #5.  The patient has been on observation since  2017.  The patient continues to feel well without any complaints.  He then was lost to follow-up after being seen on 01/23/2020.  The patient recently had a restaging CT scan performed.  Dr. Julien Nordmann personally and independently reviewed the scan and discussed the results with the patient today.  The scan showed no evidence of disease progression.   I will arrange for restaging CT scan of the chest, abdomen, pelvis in 1 months.  We will see him back for follow-up visit at that time for evaluation to review his scan results.  The patient was advised to call immediately if he has any concerning symptoms in the interval. The patient voices understanding of current disease status and treatment options and is in agreement with the current care plan. All questions were answered. The patient knows to call the clinic with any problems, questions or concerns. We can certainly see the patient much sooner if necessary       Orders Placed This Encounter  Procedures   CT Chest W Contrast    Standing Status:   Future    Standing Expiration Date:   04/08/2022    Order Specific Question:   If indicated for the ordered procedure, I authorize the administration of contrast media per Radiology protocol    Answer:   Yes    Order Specific Question:   Preferred imaging location?    Answer:   [facility]   CT Abdomen Pelvis W Contrast    Standing Status:   Future    Standing Expiration Date:   04/08/2022    Order Specific Question:   If indicated for the ordered procedure, I authorize the administration of contrast media  per Radiology protocol    Answer:   Yes    Order Specific Question:   Preferred imaging location?    Answer:   [facility]    Order Specific Question:   Is Oral Contrast requested for this exam?    Answer:   Yes, Per Radiology protocol   CBC with Differential ([facility] Only)    Standing Status:   Future    Standing Expiration Date:   04/08/2022   CMP ([facility] only)    Standing Status:   Future    Standing Expiration Date:   04/08/2022      Tobe Sos Marylouise Mallet, PA-C 04/08/21  ADDENDUM: Hematology/Oncology Attending: I had a face-to-face encounter with the patient today.  I reviewed his record, lab and scan and recommended his care plan.  This is a very pleasant 61 years old African-American male with a stage IV non-small cell lung cancer, squamous cell carcinoma diagnosed in August 2017 status post 5 cycles of systemic chemotherapy with carboplatin and paclitaxel with almost complete response and he has been in observation since that time. The patient has tendency to miss his appointment and he was lost to follow-up for more than a year. He is here today for evaluation with repeat CT scan of the chest, abdomen and pelvis for restaging of his disease. I discussed the scan results with the patient and his niece and that showed no concerning findings for disease recurrence or metastasis. I recommended for him to continue on observation with repeat CT scan of the chest, abdomen and pelvis in 1 year. The patient was advised to call immediately if he has any concerning symptoms in the interval. Disclaimer: This note was dictated with voice recognition software. Similar sounding words can inadvertently be transcribed and may be missed upon review. Eilleen Kempf, MD 04/08/21

## 2021-04-08 ENCOUNTER — Inpatient Hospital Stay (HOSPITAL_BASED_OUTPATIENT_CLINIC_OR_DEPARTMENT_OTHER): Payer: Medicare Other | Admitting: Physician Assistant

## 2021-04-08 ENCOUNTER — Encounter: Payer: Self-pay | Admitting: Physician Assistant

## 2021-04-08 VITALS — BP 122/84 | HR 89 | Temp 97.7°F | Resp 16 | Ht 73.0 in | Wt 150.8 lb

## 2021-04-08 DIAGNOSIS — F17210 Nicotine dependence, cigarettes, uncomplicated: Secondary | ICD-10-CM | POA: Diagnosis not present

## 2021-04-08 DIAGNOSIS — C3411 Malignant neoplasm of upper lobe, right bronchus or lung: Secondary | ICD-10-CM | POA: Diagnosis not present

## 2021-04-08 DIAGNOSIS — Z85118 Personal history of other malignant neoplasm of bronchus and lung: Secondary | ICD-10-CM | POA: Diagnosis present

## 2021-04-08 DIAGNOSIS — Z923 Personal history of irradiation: Secondary | ICD-10-CM | POA: Diagnosis not present

## 2021-04-08 DIAGNOSIS — Z9221 Personal history of antineoplastic chemotherapy: Secondary | ICD-10-CM | POA: Diagnosis not present

## 2021-04-08 DIAGNOSIS — Z79899 Other long term (current) drug therapy: Secondary | ICD-10-CM | POA: Diagnosis not present

## 2021-05-05 DIAGNOSIS — F1010 Alcohol abuse, uncomplicated: Secondary | ICD-10-CM | POA: Insufficient documentation

## 2021-05-05 DIAGNOSIS — R7303 Prediabetes: Secondary | ICD-10-CM | POA: Insufficient documentation

## 2021-05-05 DIAGNOSIS — F250 Schizoaffective disorder, bipolar type: Secondary | ICD-10-CM | POA: Insufficient documentation

## 2021-05-05 DIAGNOSIS — E538 Deficiency of other specified B group vitamins: Secondary | ICD-10-CM | POA: Insufficient documentation

## 2021-05-05 DIAGNOSIS — R4182 Altered mental status, unspecified: Secondary | ICD-10-CM | POA: Insufficient documentation

## 2021-05-05 DIAGNOSIS — I10 Essential (primary) hypertension: Secondary | ICD-10-CM | POA: Insufficient documentation

## 2021-05-05 DIAGNOSIS — C3411 Malignant neoplasm of upper lobe, right bronchus or lung: Secondary | ICD-10-CM | POA: Insufficient documentation

## 2021-05-05 DIAGNOSIS — C792 Secondary malignant neoplasm of skin: Secondary | ICD-10-CM | POA: Insufficient documentation

## 2021-05-05 DIAGNOSIS — F1414 Cocaine abuse with cocaine-induced mood disorder: Secondary | ICD-10-CM | POA: Insufficient documentation

## 2021-05-05 DIAGNOSIS — G4700 Insomnia, unspecified: Secondary | ICD-10-CM | POA: Insufficient documentation

## 2021-05-06 ENCOUNTER — Emergency Department (HOSPITAL_COMMUNITY): Payer: Medicare Other

## 2021-05-06 ENCOUNTER — Ambulatory Visit (INDEPENDENT_AMBULATORY_CARE_PROVIDER_SITE_OTHER)
Admission: EM | Admit: 2021-05-06 | Discharge: 2021-05-06 | Disposition: A | Discharge status: Other Acute Inpatient Hospital | Payer: Medicare Other | Source: Home / Self Care

## 2021-05-06 ENCOUNTER — Emergency Department (HOSPITAL_COMMUNITY)
Admission: EM | Admit: 2021-05-06 | Discharge: 2021-05-07 | Disposition: A | Discharge status: Home / Self Care | Payer: Medicare Other | Attending: Emergency Medicine | Admitting: Emergency Medicine

## 2021-05-06 DIAGNOSIS — C792 Secondary malignant neoplasm of skin: Secondary | ICD-10-CM | POA: Insufficient documentation

## 2021-05-06 DIAGNOSIS — T1490XA Injury, unspecified, initial encounter: Secondary | ICD-10-CM

## 2021-05-06 DIAGNOSIS — C3411 Malignant neoplasm of upper lobe, right bronchus or lung: Secondary | ICD-10-CM | POA: Diagnosis not present

## 2021-05-06 DIAGNOSIS — F1010 Alcohol abuse, uncomplicated: Secondary | ICD-10-CM | POA: Insufficient documentation

## 2021-05-06 DIAGNOSIS — F10129 Alcohol abuse with intoxication, unspecified: Secondary | ICD-10-CM | POA: Diagnosis present

## 2021-05-06 DIAGNOSIS — E538 Deficiency of other specified B group vitamins: Secondary | ICD-10-CM | POA: Diagnosis not present

## 2021-05-06 DIAGNOSIS — R4182 Altered mental status, unspecified: Secondary | ICD-10-CM | POA: Insufficient documentation

## 2021-05-06 DIAGNOSIS — F1914 Other psychoactive substance abuse with psychoactive substance-induced mood disorder: Secondary | ICD-10-CM | POA: Insufficient documentation

## 2021-05-06 DIAGNOSIS — F1999 Other psychoactive substance use, unspecified with unspecified psychoactive substance-induced disorder: Secondary | ICD-10-CM | POA: Diagnosis not present

## 2021-05-06 DIAGNOSIS — Z79899 Other long term (current) drug therapy: Secondary | ICD-10-CM | POA: Insufficient documentation

## 2021-05-06 DIAGNOSIS — F1414 Cocaine abuse with cocaine-induced mood disorder: Secondary | ICD-10-CM | POA: Insufficient documentation

## 2021-05-06 DIAGNOSIS — F1920 Other psychoactive substance dependence, uncomplicated: Secondary | ICD-10-CM | POA: Diagnosis present

## 2021-05-06 DIAGNOSIS — Z046 Encounter for general psychiatric examination, requested by authority: Secondary | ICD-10-CM | POA: Diagnosis present

## 2021-05-06 DIAGNOSIS — R7303 Prediabetes: Secondary | ICD-10-CM | POA: Insufficient documentation

## 2021-05-06 DIAGNOSIS — I10 Essential (primary) hypertension: Secondary | ICD-10-CM | POA: Diagnosis not present

## 2021-05-06 DIAGNOSIS — F250 Schizoaffective disorder, bipolar type: Secondary | ICD-10-CM

## 2021-05-06 DIAGNOSIS — S0990XA Unspecified injury of head, initial encounter: Secondary | ICD-10-CM

## 2021-05-06 DIAGNOSIS — G4700 Insomnia, unspecified: Secondary | ICD-10-CM | POA: Diagnosis not present

## 2021-05-06 DIAGNOSIS — R451 Restlessness and agitation: Secondary | ICD-10-CM

## 2021-05-06 LAB — CBC WITH DIFFERENTIAL/PLATELET
Abs Immature Granulocytes: 0 10*3/uL (ref 0.00–0.07)
Basophils Absolute: 0 10*3/uL (ref 0.0–0.1)
Basophils Relative: 0 %
Eosinophils Absolute: 0 10*3/uL (ref 0.0–0.5)
Eosinophils Relative: 1 %
HCT: 40.5 % (ref 39.0–52.0)
Hemoglobin: 13.6 g/dL (ref 13.0–17.0)
Immature Granulocytes: 0 %
Lymphocytes Relative: 40 %
Lymphs Abs: 2.3 10*3/uL (ref 0.7–4.0)
MCH: 31.5 pg (ref 26.0–34.0)
MCHC: 33.6 g/dL (ref 30.0–36.0)
MCV: 93.8 fL (ref 80.0–100.0)
Monocytes Absolute: 0.4 10*3/uL (ref 0.1–1.0)
Monocytes Relative: 7 %
Neutro Abs: 3.1 10*3/uL (ref 1.7–7.7)
Neutrophils Relative %: 52 %
Platelets: 242 10*3/uL (ref 150–400)
RBC: 4.32 MIL/uL (ref 4.22–5.81)
RDW: 13.3 % (ref 11.5–15.5)
WBC: 5.8 10*3/uL (ref 4.0–10.5)
nRBC: 0 % (ref 0.0–0.2)

## 2021-05-06 LAB — COMPREHENSIVE METABOLIC PANEL
ALT: 17 U/L (ref 0–44)
AST: 32 U/L (ref 15–41)
Albumin: 4.7 g/dL (ref 3.5–5.0)
Alkaline Phosphatase: 97 U/L (ref 38–126)
Anion gap: 10 (ref 5–15)
BUN: 17 mg/dL (ref 6–20)
CO2: 23 mmol/L (ref 22–32)
Calcium: 9.2 mg/dL (ref 8.9–10.3)
Chloride: 100 mmol/L (ref 98–111)
Creatinine, Ser: 0.88 mg/dL (ref 0.61–1.24)
GFR, Estimated: >60 mL/min (ref >60)
Glucose, Bld: 92 mg/dL (ref 70–99)
Potassium: 4 mmol/L (ref 3.5–5.1)
Sodium: 133 mmol/L — ABNORMAL LOW (ref 135–145)
Total Bilirubin: 0.8 mg/dL (ref 0.3–1.2)
Total Protein: 8.1 g/dL (ref 6.5–8.1)

## 2021-05-06 LAB — ETHANOL: Alcohol, Ethyl (B): 157 mg/dL — ABNORMAL HIGH (ref <10)

## 2021-05-06 MED ORDER — LORAZEPAM 2 MG/ML IJ SOLN: 0.0000 mg | Freq: Two times a day (BID) | INTRAMUSCULAR | Status: DC

## 2021-05-06 MED ORDER — LORAZEPAM 2 MG/ML IJ SOLN: 0.0000 mg | Freq: Four times a day (QID) | INTRAMUSCULAR | Status: DC

## 2021-05-06 MED ORDER — BENZTROPINE MESYLATE 0.5 MG PO TABS
0.5000 mg | ORAL_TABLET | Freq: Two times a day (BID) | ORAL | Status: DC
  Administered 2021-05-06 – 2021-05-07 (×3): 0.5 mg via ORAL
  Filled 2021-05-06 (×3): qty 1

## 2021-05-06 MED ORDER — LORAZEPAM 1 MG PO TABS
0.0000 mg | ORAL_TABLET | Freq: Four times a day (QID) | ORAL | Status: DC
  Administered 2021-05-06: 1 mg via ORAL
  Filled 2021-05-06: qty 1

## 2021-05-06 MED ORDER — THIAMINE HCL 100 MG PO TABS
100.0000 mg | ORAL_TABLET | Freq: Every day | ORAL | Status: DC
  Administered 2021-05-06 – 2021-05-07 (×2): 100 mg via ORAL
  Filled 2021-05-06 (×2): qty 1

## 2021-05-06 MED ORDER — LORAZEPAM 1 MG PO TABS: 0.0000 mg | ORAL_TABLET | Freq: Two times a day (BID) | ORAL | Status: DC

## 2021-05-06 MED ORDER — THIAMINE HCL 100 MG/ML IJ SOLN: 100.0000 mg | Freq: Every day | INTRAMUSCULAR | Status: DC

## 2021-05-06 MED ORDER — HALOPERIDOL 1 MG PO TABS
2.0000 mg | ORAL_TABLET | Freq: Two times a day (BID) | ORAL | Status: DC
  Administered 2021-05-06 – 2021-05-07 (×3): 2 mg via ORAL
  Filled 2021-05-06 (×3): qty 2

## 2021-05-06 NOTE — BH Assessment (Addendum)
[facility] Assessment Progress Note   Per Margorie John, PA, this pt requires psychiatric hospitalization at a facility providing specialty care for geriatric pts at this time.  Pt presents under IVC initiated by Margorie John, PA.  [facility] will not be able to accommodate this pt today.  At the direction of Hampton Abbot, MD this writer has sought placement for pt at facilities outside of the [facility] system. The following facilities have been contacted to seek placement for this pt, with results as noted:  Beds available, information sent, decision pending: [place]: [facility] (due to substance use disorder)  Unable to reach: [place]: [facility] (geriatric unit currently closed)  Jalene Mullet, [place] Coordinator [phone]

## 2021-05-06 NOTE — Progress Notes (Signed)
CSW received a phone call from Adela Ports confirming that pt still needed placement. CSW advised pt meets inpatient behavioral health per Margorie John. CSW informed that pt will be reviewed and a follow up phone call will be made once pt is reviewed. CSW ill assist and follow.    Benjaman Kindler, MSW, [facility] 05/06/2021 8:43 PM

## 2021-05-06 NOTE — ED Notes (Signed)
IVC PAPERWORK FAXED TO [facility].

## 2021-05-06 NOTE — ED Notes (Signed)
Mr Keith Murray posturing and verbal aggressive behavior toward another pt when staff intervened and moved him to room 137 where he could be separated from others

## 2021-05-06 NOTE — Progress Notes (Signed)
Pt brought in by [employer] after neighbors complained.  Pt denies SI.  He has made threats to kill staff here at [facility].  Pt hitting tables, kicking chairs.  Responding to internal stimuli.  Provider is IVC'ing patient.

## 2021-05-06 NOTE — ED Notes (Signed)
Pt is verbally aggressive making threats toward staff and posturing to strike staff. Aggressive attempt curtailed by show of force by staff and security officers. Pt bounding on table yelling at staff using much profanity while lunge toward staff threatening to strike staff

## 2021-05-06 NOTE — ED Provider Notes (Addendum)
Behavioral Health Urgent Care Medical Screening Exam  Patient Name: Keith Murray MRN: [id number] Date of Evaluation: 05/06/21 Chief Complaint:  Aggressive Behavior Diagnosis:  Final diagnoses:  Schizoaffective disorder, bipolar type ([place])  Altered mental status, unspecified altered mental status type    History of Present illness: Keith Murray is a 61 y.o. male with documented past psychiatric history significant for schizoaffective disorder bipolar type, schizoaffective disorder depressive type, alcohol abuse/dependence, cocaine abuse, insomnia, and cocaine induced mood disorder, as well as past medical history significant for primary malignant neoplasm of the lung, malignant neoplasm of the right upper lobe of lung (per chart review, it appears that patient had a [place] office visit on 04/08/2021 in which documentation of this visit states that patient has "Stage IV (T3, N2, M1 B) non-small cell lung cancer, squamous cell carcinoma with PDL 1 expression of 25% presented with massive right upper lobe lung mass as well as mediastinal lymphadenopathy and subcutaneous metastatic disease diagnosed in August 2017"), skin metastases, postobstructive pneumonia, prediabetes, neuropathic pain, vitamin B12 deficiency, and hypertension, who presents to the [facility] behavioral health urgent care [facility]) voluntarily via Event organiser for walk-in evaluation.  Per nursing staff, it was reported by law enforcement to nursing staff that prior to police bringing the patient to the [facility], the patient was punching his neighbors mailboxes and causing property destruction.  On exam, patient is extremely agitated and largely uncooperative with answering questions during the evaluation. Patient appears to be displaying signs of acute psychosis, as he appears to be responding to internal stimuli and talking to himself on exam.  Patient also appears to potentially be intoxicated, as the patient has a  strong odor of alcohol on exam.  On exam, patient is also posturing towards this provider and [facility] staff, making punching and kicking gestures towards this provider and [employer] staff, shouting verbal threats to harm and kill this provider while posturing towards this provider, and repeatedly forcefully punching the table of the exam room. Per [facility] staff, patient also intentionally hit his head against the wall of the exam room.  Patient does not allow this provider to get close enough to the patient to examine his head or his upper/lower extremities, although the patient's right hand does appear to be slightly swollen on exam.  Patient refuses to cooperate with answering any ROS questions asked regarding physical symptoms.  When patient is asked why the police brought him to the [facility], the patient appears to become defensive and increasingly agitated and begins screaming at this provider.  Patient denies SI on exam.  He denies history of any previous suicide attempts.  He denies HI.  Patient does not answer any questions asked about AVH during the evaluation.  When patient is asked about his sleep, patient states that he "can't sleep" and does not provide details regarding number of hours of sleep per night.  When patient is asked about his appetite, patient states that he has lost a lot of weight, but does not provide details regarding how much weight he has lost specifically over the past few months.   Patient states that he is supposed to be taking psychotropic medications, but he states that he does not know the names or dosages of the medications that was supposed to be taking.  Per chart review, it appears that patient recently had the following prescriptions dispensed: Cogentin 0.5 mg p.o. twice daily on 04/20/2021, fluoxetine 20 mg p.o. daily on 04/20/2021, gabapentin 300 mg p.o. in the  morning and at bedtime on 04/20/2021, Haldol 10 mg p.o. twice daily on 04/20/2021, trazodone 100 mg p.o. at bedtime on  04/20/2021, and Haldol decanoate 100 mg/milliliter IM injection on 03/19/2021 (unclear on when/if this injection was actually administered).  Patient does state that he has received an injection before, but patient states that he does not know when he last had an injection or when he is scheduled for his next injection.  Per chart review, it appears that patient has had multiple previous psychiatric hospitalizations at [facility]), which per chart review, most recent admission appears to be from 05/14/2016 to 05/18/2016.  Patient states that he does not know when the last time was that he was psychiatrically hospitalized.  Patient states that he drinks alcohol, but he refuses to answer further questions or provide further details regarding his alcohol use or regarding if he is using any other substances currently.  On exam, patient alternates positions between sitting upright and standing up, posturing towards this provider and [facility] staff.  Patient appears to be experiencing some altered mental status, as patient is alert and oriented to his full name, but patient is not oriented to place, time (current month, day of the week, or year), or situation.  Patient's appearance is bizarre and disheveled.  Eye contact is poor.  Speech is garbled and difficult to understand during the evaluation.  Speech volume is increased.  Patient's mood is irritable with congruent, labile affect.  Thought process is disorganized.  Patient appears to be responding to internal stimuli throughout the evaluation.  Psychiatric Specialty Exam  Presentation  General Appearance:Bizarre; Disheveled  Eye Contact:Poor  Speech:Garbled  Speech Volume:Increased  Handedness:No data recorded  Mood and Affect  Mood:Irritable  Affect:Congruent; Labile   Thought Process  Thought Processes:Disorganized  Descriptions of Associations:Circumstantial  Orientation:Partial (See HPI for details.)  Thought  Content:Scattered    Hallucinations:-- (Patient does not answer questions regarding AVH.)  Ideas of Reference:-- (UTA at this time due to patient's presentation, including level of agitation and lack of cooperation with answering questions during the evaluation.)  Suicidal Thoughts:No  Homicidal Thoughts:No   Sensorium  Memory:-- (UTA at this time due to patient's presentation, including level of agitation and lack of cooperation with answering questions during the evaluation.)  Judgment:Poor  Insight:Poor; Lacking   Executive Functions  Concentration:Poor  Attention Span:Poor  Recall:-- (UTA at this time due to patient's presentation, including level of agitation and lack of cooperation with answering questions during the evaluation.)  Fund of Knowledge:-- (UTA at this time due to patient's presentation, including level of agitation and lack of cooperation with answering questions during the evaluation.)  Language:Poor   Psychomotor Activity  Psychomotor Activity:Increased; Restlessness   Assets  Assets:Communication Skills; Financial Resources/Insurance; Leisure Time; Physical Health   Sleep  Sleep:-- (Patient does not provide response to questions regarding sleep.)  Number of hours: No data recorded  No data recorded  Physical Exam: Physical Exam Vitals reviewed.  Constitutional:      Appearance: He is not ill-appearing, toxic-appearing or diaphoretic.  HENT:     Head:     Comments: Patient does not allow this provider to get close enough to the patient to examine his head.     Right Ear: External ear normal.     Left Ear: External ear normal.     Nose: Nose normal.  Eyes:     Comments: Patient does not allow this provider to get close enough to the patient to examine  his eyes.  Cardiovascular:     Rate and Rhythm: Normal rate.  Pulmonary:     Effort: Pulmonary effort is normal. No respiratory distress.  Musculoskeletal:     Cervical back: Normal  range of motion.     Comments: Patient does not allow this provider to get close enough to the patient to examine his upper/lower extremities, although the patient's right hand does appear to be slightly swollen on exam and patient does appear to have full range of motion of his upper and lower extremities at this time.   Neurological:     Mental Status: He is alert.     Comments: No tremor noted. Patient is alert and oriented to his full name, but patient is not oriented to place, time (current month, day of the week, or year), or situation.  Psychiatric:        Behavior: Behavior is uncooperative, agitated, aggressive and hyperactive. Behavior is not slowed or withdrawn.        Thought Content: Thought content does not include homicidal or suicidal ideation.     Comments: Patient does not answer questions asked about AVH.  Speech is garbled and difficult to understand during the evaluation.  Speech volume is increased.  Patient's mood is irritable with congruent, labile affect.  Thought process is disorganized.  Patient appears to be responding to internal stimuli throughout the evaluation.   ROS Patient refuses to cooperate with answering any ROS questions asked regarding physical symptoms during the evaluation.   Vitals: Blood pressure (!) 141/89, pulse 89, temperature 98.2 F (36.8 C), temperature source Oral, resp. rate 16, SpO2 100 %. There is no height or weight on file to calculate BMI.  Musculoskeletal: Strength & Muscle Tone: within normal limits Gait & Station: normal Patient leans: N/A   [place] MSE Discharge Disposition for Follow up and Recommendations: Based on my evaluation I certify that psychiatric inpatient services furnished can reasonably be expected to improve the patient's condition which I recommend transfer to an appropriate accepting facility.  Based on my evaluation the patient appears to have an emergency medical condition for which I recommend the patient be  transferred to the emergency department for further evaluation.   Based on patient's current presentation of what appears to be symptoms of acute psychosis and aggressive behavior (see HPI for details), the patient's psychiatric condition appears to be severely decompensated to the point that the patient is having extreme difficulty in functioning in his activities of daily living and the patient also appears to be a danger to himself and others at this time.  Thus, based on these factors, the patient meets inpatient geriatric psychiatric treatment criteria at this time (recommend inpatient geropsych treatment based on patient's age and medical history- see HPI for details).  Patient refusing further treatment at this time.  Based on the factors of patient's presentation, (including safety factors) noted above, the patient meets IVC criteria at this time.  Patient petitioned for IVC by this provider and first exam paperwork completed by this provider.  This provider has confirmed with the magistrate that all necessary IVC paperwork has been received.  Recommend inpatient geriatric psychiatric (geropsychiatric) treatment for the patient when medically cleared.  Furthermore, based on patient's current presentation of what appears to be altered mental status, likely intoxication (patient smelled strongly of alcohol and patient does not answer majority of questions asked about alcohol/substance use), right hand swelling and potential right hand injury (potentially sustained from when patient was punching the table  in the exam room and from when patient was reportedly punching mailboxes before being brought to the [facility]), and patient reportedly hitting his head on the wall of the exam room (patient would not allow this provider to get close enough to him to examine his head), recommend that the patient be transferred to the emergency department for further medical clearance/evaluation/treatment prior to receiving  inpatient care psychiatric treatment. Of note, patient does have history of malignant neoplasm of the right upper lobe of lung (see HPI for details).  Will transfer the patient to [facility] emergency department for further medical clearance/evaluation/treatment of the issues noted above. Due to patient's current presentation and current displayed aggressive and threatening behaviors (see HPI for details), as well as patient's past medical history (see HPI for details), patient is not appropriate for transfer back to the [facility] to await placement for inpatient geropsychiatric treatment following medical clearance at this time.  Following medical clearance, patient will remain in the ED for further observation/safety monitoring and social work will seek placement for inpatient geriatric psychiatric treatment for the patient while the patient is in the ED.  Provider report given to Dr. Dina Rich, who has agreed to accept the patient.  EMTALA form completed.   Prescilla Sours, PA-C 05/06/2021, 6:39 AM

## 2021-05-06 NOTE — ED Notes (Signed)
Pt transported to [facility]  under IVC for aggressive behavior  by [employer]

## 2021-05-06 NOTE — ED Notes (Signed)
Patient to room. Patient cooperative. Patient oriented to unit and room. Patient ambulatory.

## 2021-05-06 NOTE — ED Notes (Signed)
Pt sent here from [facility] under IVC by Einar Pheasant PA Pt was said to be combative and aggressive at [facility] Pt arrives here with [employer] and has been cooperative and not aggressive

## 2021-05-06 NOTE — ED Provider Notes (Signed)
[facility] DEPT Provider Note   CSN: [id number] Arrival date & time: 05/06/21  0231     History  No chief complaint on file.   Keith Murray is a 61 y.o. male.  HPI     This is a 61 year old male with schizoaffective disorder who presents from [facility] with concerns for aggressive behavior and psychosis.  Patient was IVC by the provider at [facility] because he was making aggressive gestures.  Per report, patient was brought in by police for punching his neighbors mailboxes.  He endorses he did punch his neighbors mailboxes; however, he believes that "they are talking to him.  When asked he just says "they."  He denies overt SI or HI.  He does endorse alcohol drinking earlier this evening.  He states he drinks beer.  He is cooperative for me.  He states that he will behave and allow for [place] to draw labs.  Home Medications Prior to Admission medications   Medication Sig Start Date End Date Taking? Authorizing Provider  benztropine (COGENTIN) 0.5 MG tablet Take 1 tablet (0.5 mg total) by mouth 2 (two) times daily. 05/17/16   Kerrie Buffalo, NP  FLUoxetine (PROZAC) 20 MG capsule Take 1 capsule (20 mg total) by mouth daily. 05/18/16   Kerrie Buffalo, NP  folic acid (FOLVITE) 1 MG tablet Take 1 mg by mouth daily. 01/03/20   [provider]  gabapentin (NEURONTIN) 300 MG capsule Take 300 mg by mouth in the morning and at bedtime. 01/03/20   [provider]  haloperidol (HALDOL) 10 MG tablet Take 10 mg by mouth 2 (two) times daily. 01/03/20   [provider]  haloperidol decanoate (HALDOL DECANOATE) 100 MG/ML injection Inject into the muscle. 03/19/21   [provider]  hydrOXYzine (ATARAX/VISTARIL) 25 MG tablet Take 1 tablet (25 mg total) by mouth every 6 (six) hours as needed for anxiety. 05/17/16   Kerrie Buffalo, NP  traZODone (DESYREL) 100 MG tablet Take 1 tablet (100 mg total) by mouth at bedtime. 05/17/16   Kerrie Buffalo, NP  vitamin  B-12 (CYANOCOBALAMIN) 1000 MCG tablet Take 1,000 mcg by mouth daily. 01/03/20   [provider]      Allergies    Patient has no known allergies.    Review of Systems   Review of Systems  Musculoskeletal:        Hand pain  Psychiatric/Behavioral:  Positive for agitation and hallucinations.   All other systems reviewed and are negative.  Physical Exam Updated Vital Signs BP (!) 141/89    Pulse 89    Temp 98.2 F (36.8 C)    Resp 16    SpO2 100%  Physical Exam Vitals and nursing note reviewed.  Constitutional:      Appearance: He is well-developed.     Comments: Disheveled, nontoxic, cooperative  HENT:     Head: Normocephalic and atraumatic.     Mouth/Throat:     Mouth: Mucous membranes are dry.  Eyes:     Pupils: Pupils are equal, round, and reactive to light.  Cardiovascular:     Rate and Rhythm: Normal rate and regular rhythm.  Pulmonary:     Effort: Pulmonary effort is normal. No respiratory distress.  Abdominal:     Palpations: Abdomen is soft.  Musculoskeletal:     Cervical back: Neck supple.     Comments: Swelling noted at the fourth and fifth metacarpals on the right hand with an abrasion noted  Skin:    General: Skin  is warm and dry.  Neurological:     Mental Status: He is alert and oriented to person, place, and time.  Psychiatric:     Comments: Obviously hallucinating but cooperative    ED Results / Procedures / Treatments   Labs (all labs ordered are listed, but only abnormal results are displayed) Labs Reviewed  ETHANOL - Abnormal; Notable for the following components:      Result Value   Alcohol, Ethyl (B) 157 (*)    All other components within normal limits  COMPREHENSIVE METABOLIC PANEL - Abnormal; Notable for the following components:   Sodium 133 (*)    All other components within normal limits  CBC WITH DIFFERENTIAL/PLATELET  RAPID URINE DRUG SCREEN, HOSP PERFORMED  URINALYSIS, COMPLETE (UACMP) WITH MICROSCOPIC     EKG None  Radiology No results found.  Procedures Procedures    Medications Ordered in ED Medications - No data to display  ED Course/ Medical Decision Making/ A&P                           Medical Decision Making Amount and/or Complexity of Data Reviewed Labs: ordered. Radiology: ordered.   This patient presents to the ED for concern of aggression, this involves an extensive number of treatment options, and is a complaint that carries with it a high risk of complications and morbidity.  The differential diagnosis includes psychosis, hallucination, intoxication  MDM:    This is a 61 year old male who presents with aggressive behavior.  He is hallucinating but cooperative.  Denies SI or HI.  We will medically clear him with lab work and x-rays.  He was also noted to hit his head on the wall at the behavioral health unit.  For that reason we will obtain a CT head. (Labs, imaging)  Labs: I Ordered, and personally interpreted labs.  The pertinent results include: Alcohol level 157  Imaging Studies ordered: I ordered imaging studies including x-ray reviewed by myself.  No office fracture I independently visualized and interpreted imaging. I agree with the radiologist interpretation  Additional history obtained from PA at behavioral health.  External records from outside source obtained and reviewed including prior visit  Critical Interventions: None  Consultations: I requested consultation with the behavioral health,  and discussed lab and imaging findings as well as pertinent plan - they recommend: Inpatient evaluation  Cardiac Monitoring: The patient was maintained on a cardiac monitor.  I personally viewed and interpreted the cardiac monitored which showed an underlying rhythm of: None  Reevaluation: After the interventions noted above, I reevaluated the patient and found that they have :improved   Considered admission for: Psychiatric evaluation  Social  Determinants of Health: Schizoaffective disorder  Disposition: Per psych  Co morbidities that complicate the patient evaluation  Past Medical History:  Diagnosis Date   Anxiety    met lung ca dx'd 10/2015   lung   Polysubstance abuse ([place])    cocaine, etoh, marijuana   Psychosis ([place])    Schizoaffective disorder ([place])      Medicines No orders of the defined types were placed in this encounter.   I have reviewed the patients home medicines and have made adjustments as needed  Problem List / ED Course: Problem List Items Addressed This Visit   None Visit Diagnoses     Agitation    -  Primary   Trauma       Relevant Orders   DG Hand Complete Right  Final Clinical Impression(s) / ED Diagnoses Final diagnoses:  Agitation    Rx / DC Orders ED Discharge Orders     None         Jadea Shiffer, Barbette Hair, MD 05/06/21 0700

## 2021-05-06 NOTE — ED Notes (Signed)
Pt is dressed out and was wanded by security. Pts belongings are in cabinet 16-18

## 2021-05-06 NOTE — Discharge Instructions (Addendum)
Patient to be transferred to [facility] ED for medical clearance for inpatient psychiatric treatment

## 2021-05-06 NOTE — ED Provider Notes (Signed)
Patient is medically clear for pending TTS/psych eval.  Labs are notable for mildly elevated alcohol level at 157.  This was obtained at 2:30 AM.  Imaging studies are without significant abnormality   Valarie Merino, MD 05/06/21 [phone]

## 2021-05-06 NOTE — ED Notes (Signed)
Patient resting comfortably

## 2021-05-07 DIAGNOSIS — F1999 Other psychoactive substance use, unspecified with unspecified psychoactive substance-induced disorder: Secondary | ICD-10-CM | POA: Diagnosis not present

## 2021-05-07 LAB — URINALYSIS, COMPLETE (UACMP) WITH MICROSCOPIC
Bacteria, UA: NONE SEEN
Bilirubin Urine: NEGATIVE
Glucose, UA: NEGATIVE mg/dL
Ketones, ur: NEGATIVE mg/dL
Leukocytes,Ua: NEGATIVE
Nitrite: NEGATIVE
Protein, ur: NEGATIVE mg/dL
Specific Gravity, Urine: 1.018 (ref 1.005–1.030)
pH: 5 (ref 5.0–8.0)

## 2021-05-07 LAB — RAPID URINE DRUG SCREEN, HOSP PERFORMED
Amphetamines: NOT DETECTED
Barbiturates: NOT DETECTED
Benzodiazepines: NOT DETECTED
Cocaine: POSITIVE — AB
Opiates: NOT DETECTED
Tetrahydrocannabinol: POSITIVE — AB

## 2021-05-07 NOTE — BH Assessment (Addendum)
[facility] Assessment Progress Note   Per Oneida Alar, NP, this pt does not require psychiatric hospitalization at this time.  Pt presents under IVC initiated by Margorie John, PA which has been rescinded by Hampton Abbot, MD.  Pt is psychiatrically cleared.  Discharge instructions include referral information for the [facility].  EDP Malvin Johns, MD and pt's nurse, Lisette Grinder, have been notified.  Pt's registration record indicates that his niece, [name] ([phone]), is his legal guardian.  This Probation officer spoke to her.  She reports that she is his payee, but that pt is his own guardian.  Pt subsequently signed Consent to Release Information to her.  I called back and notified her of disposition.  She reports that she will not be able to pick pt up today, but agrees to call her sister to make arrangements.  I have contacted Registration to ask them to update pt's record.  Jalene Mullet, [place] Triage Specialist [phone]

## 2021-05-07 NOTE — Consult Note (Signed)
Telepsych Consultation   Reason for Consult:  psych consult Referring Physician:  Margorie John, PA-C Location of Patient: [facility] Location of Provider: [place] Department  Patient Identification: Keith Murray MRN:  [id number] Principal Diagnosis: Substance-induced disorder [facility]) Diagnosis:  Principal Problem:   Substance-induced disorder ([place]) Active Problems:   Polysubstance (excluding opioids) dependence ([place])   Alcohol abuse with intoxication ([place])   Total Time spent with patient: 20 minutes  Subjective:   Keith Murray is a 61 y.o. male patient admitted with substance induced disorder. "I just got upset because my sister is in the hospital dying with cancer. [place] I have 3 nieces and a nephew for support".   Patient denies any active or history of suicidal ideations or attempts. He appears alert and oriented, calm and cooperative; denies any auditory or visual hallucinations. BAL 157, UDS+ cocaine, THC; however he declines any substance abuse treatment at this time. Will provide resources in AVS.   Collateral: [name] (niece/legal guardian) [phone]) States pt is her uncle whom she takes care of but is not "legally" his guardian. Confirms his sister is in [place]. Says patient's spiral began when his twin brother passed away 22 then four years later her mother and another sister died in a motor vehicle accident.  States patient was valedictorian of his class and after the losses began experimenting with drugs and never recovered. Says sister who is upstairs is in fact dying and is the only other sibling than himself. She denies any concerns of suicide or homicide and plans to come downstairs to check in with patient.  Says  HPI:  Keith Murray is a 61 year old male patient with a past psychiatric history of polysubstance abuse, alcohol dependence, alcohol abuse with intoxication, cocaine abuse with  cocaine induced mood disorder, schizoaffective disorder who initially presented to [facility] with possible psychosis; patient was noted to be verbally aggressive and making threats towards staff. Patient was then transferred to [facility] due to acuity of behaviors.  BAL 157, UDS+ cocaine, THC. Of note patient recently (04/08/21) diagnosed with stage IV lung cancer, "Stage IV (T3, N2, M1 B) non-small cell lung cancer, squamous cell carcinoma with PDL 1 expression of 25% presented with massive right upper lobe lung mass as well as mediastinal lymphadenopathy and subcutaneous metastatic disease diagnosed in August 2017")".   Past Psychiatric History: polysubstance abuse, alcohol dependence, alcohol abuse with intoxication, cocaine abuse with cocaine induced mood disorder, schizoaffective disorder  Risk to Self:   Risk to Others:   Prior Inpatient Therapy:   Prior Outpatient Therapy:    Past Medical History:  Past Medical History:  Diagnosis Date   Anxiety    met lung ca dx'd 10/2015   lung   Polysubstance abuse ([place])    cocaine, etoh, marijuana   Psychosis ([place])    Schizoaffective disorder ([place])     Past Surgical History:  Procedure Laterality Date   HERNIA REPAIR     Family History:  Family History  Problem Relation Age of Onset   Cancer Neg Hx    Schizophrenia Neg Hx    Family Psychiatric  History: not noted Social History:  Social History   Substance and Sexual Activity  Alcohol Use Yes   Comment: occasionally drinks 1 to 2 beers     Social History   Substance and Sexual Activity  Drug Use Yes   Types: Cocaine, Marijuana   Comment: THC  about once per week and cocaine on occasion.     Social History   Socioeconomic History   Marital status: Single    Spouse name: Not on file   Number of children: Not on file   Years of education: Not on file   Highest education level: Not on file  Occupational History   Not on file  Tobacco Use   Smoking status: Every Day    Packs/day:  0.25    Years: 4.00    Pack years: 1.00    Types: Cigarettes   Smokeless tobacco: Never  Substance and Sexual Activity   Alcohol use: Yes    Comment: occasionally drinks 1 to 2 beers   Drug use: Yes    Types: Cocaine, Marijuana    Comment: THC about once per week and cocaine on occasion.    Sexual activity: Yes    Birth control/protection: Condom  Other Topics Concern   Not on file  Social History Narrative   Not on file   Social Determinants of Health   Financial Resource Strain: Not on file  Food Insecurity: Not on file  Transportation Needs: Not on file  Physical Activity: Not on file  Stress: Not on file  Social Connections: Not on file   Additional Social History:    Allergies:  No Known Allergies  Labs:  Results for orders placed or performed during the hospital encounter of 05/06/21 (from the past 48 hour(s))  Ethanol     Status: Abnormal   Collection Time: 05/06/21  2:35 AM  Result Value Ref Range   Alcohol, Ethyl (B) 157 (H) <10 mg/dL    Comment: (NOTE) Lowest detectable limit for serum alcohol is 10 mg/dL.  For medical purposes only. Performed at [facility], [place] [address], [place], [place] [address]   CBC with Differential/Platelet     Status: None   Collection Time: 05/06/21  2:35 AM  Result Value Ref Range   WBC 5.8 4.0 - 10.5 K/uL   RBC 4.32 4.22 - 5.81 MIL/uL   Hemoglobin 13.6 13.0 - 17.0 g/dL   HCT 40.5 39.0 - 52.0 %   MCV 93.8 80.0 - 100.0 fL   MCH 31.5 26.0 - 34.0 pg   MCHC 33.6 30.0 - 36.0 g/dL   RDW 13.3 11.5 - 15.5 %   Platelets 242 150 - 400 K/uL   nRBC 0.0 0.0 - 0.2 %   Neutrophils Relative % 52 %   Neutro Abs 3.1 1.7 - 7.7 K/uL   Lymphocytes Relative 40 %   Lymphs Abs 2.3 0.7 - 4.0 K/uL   Monocytes Relative 7 %   Monocytes Absolute 0.4 0.1 - 1.0 K/uL   Eosinophils Relative 1 %   Eosinophils Absolute 0.0 0.0 - 0.5 K/uL   Basophils Relative 0 %   Basophils Absolute 0.0 0.0 - 0.1 K/uL   Immature Granulocytes  0 %   Abs Immature Granulocytes 0.00 0.00 - 0.07 K/uL    Comment: Performed at [facility], [place] [address] [place], [place] [address]  Comprehensive metabolic panel     Status: Abnormal   Collection Time: 05/06/21  2:35 AM  Result Value Ref Range   Sodium 133 (L) 135 - 145 mmol/L   Potassium 4.0 3.5 - 5.1 mmol/L   Chloride 100 98 - 111 mmol/L   CO2 23 22 - 32 mmol/L   Glucose, Bld 92 70 - 99 mg/dL    Comment: Glucose reference range applies only to  samples taken after fasting for at least 8 hours.   BUN 17 6 - 20 mg/dL   Creatinine, Ser 0.88 0.61 - 1.24 mg/dL   Calcium 9.2 8.9 - 10.3 mg/dL   Total Protein 8.1 6.5 - 8.1 g/dL   Albumin 4.7 3.5 - 5.0 g/dL   AST 32 15 - 41 U/L   ALT 17 0 - 44 U/L   Alkaline Phosphatase 97 38 - 126 U/L   Total Bilirubin 0.8 0.3 - 1.2 mg/dL   GFR, Estimated >60 >60 mL/min    Comment: (NOTE) Calculated using the CKD-EPI Creatinine Equation (2021)    Anion gap 10 5 - 15    Comment: Performed at [facility], [place] [address] [place], [place] [address]  Rapid urine drug screen (hospital performed)     Status: Abnormal   Collection Time: 05/06/21  9:54 AM  Result Value Ref Range   Opiates NONE DETECTED NONE DETECTED   Cocaine POSITIVE (A) NONE DETECTED   Benzodiazepines NONE DETECTED NONE DETECTED   Amphetamines NONE DETECTED NONE DETECTED   Tetrahydrocannabinol POSITIVE (A) NONE DETECTED   Barbiturates NONE DETECTED NONE DETECTED    Comment: (NOTE) DRUG SCREEN FOR MEDICAL PURPOSES ONLY.  IF CONFIRMATION IS NEEDED FOR ANY PURPOSE, NOTIFY LAB WITHIN 5 DAYS.  LOWEST DETECTABLE LIMITS FOR URINE DRUG SCREEN Drug Class                     Cutoff (ng/mL) Amphetamine and metabolites    1000 Barbiturate and metabolites    200 Benzodiazepine                 916 Tricyclics and metabolites     300 Opiates and metabolites        300 Cocaine and metabolites        300 THC                            50 Performed at  [facility], [place] [address], [place], [place] [address]   Urinalysis, Complete w Microscopic Urine, Clean Catch     Status: Abnormal   Collection Time: 05/06/21  9:54 AM  Result Value Ref Range   Color, Urine YELLOW YELLOW   APPearance CLEAR CLEAR   Specific Gravity, Urine 1.018 1.005 - 1.030   pH 5.0 5.0 - 8.0   Glucose, UA NEGATIVE NEGATIVE mg/dL   Hgb urine dipstick MODERATE (A) NEGATIVE   Bilirubin Urine NEGATIVE NEGATIVE   Ketones, ur NEGATIVE NEGATIVE mg/dL   Protein, ur NEGATIVE NEGATIVE mg/dL   Nitrite NEGATIVE NEGATIVE   Leukocytes,Ua NEGATIVE NEGATIVE   RBC / HPF 11-20 0 - 5 RBC/hpf   Bacteria, UA NONE SEEN NONE SEEN   Mucus PRESENT     Comment: Performed at [facility], [place] [address], [place], [place] [address]    Medications:  Current Facility-Administered Medications  Medication Dose Route Frequency Provider Last Rate Last Admin   benztropine (COGENTIN) tablet 0.5 mg  0.5 mg Oral BID Suella Broad, FNP   0.5 mg at 05/07/21 7017   haloperidol (HALDOL) tablet 2 mg  2 mg Oral BID Suella Broad, FNP   2 mg at 05/07/21 0955   LORazepam (ATIVAN) injection 0-4 mg  0-4 mg Intravenous Q6H Starkes-Perry, Gayland Curry, FNP       Or   LORazepam (ATIVAN) tablet 0-4 mg  0-4 mg Oral Q6H Sheran Fava  S, FNP   1 mg at 05/06/21 1207   [START ON 05/08/2021] LORazepam (ATIVAN) injection 0-4 mg  0-4 mg Intravenous Q12H Suella Broad, FNP       Or   Derrill Memo ON 05/08/2021] LORazepam (ATIVAN) tablet 0-4 mg  0-4 mg Oral Q12H Starkes-Perry, Gayland Curry, FNP       thiamine tablet 100 mg  100 mg Oral Daily Suella Broad, FNP   100 mg at 05/07/21 0277   Or   thiamine (B-1) injection 100 mg  100 mg Intravenous Daily Suella Broad, FNP       Current Outpatient Medications  Medication Sig Dispense Refill   benztropine (COGENTIN) 0.5 MG tablet Take 1 tablet (0.5 mg total) by mouth 2 (two) times daily. 60 tablet 0    FLUoxetine (PROZAC) 20 MG capsule Take 1 capsule (20 mg total) by mouth daily. 30 capsule 0   gabapentin (NEURONTIN) 300 MG capsule Take 300 mg by mouth in the morning and at bedtime.     haloperidol (HALDOL) 10 MG tablet Take 10 mg by mouth 2 (two) times daily.     haloperidol decanoate (HALDOL DECANOATE) 100 MG/ML injection Inject 100 mg into the muscle every 28 (twenty-eight) days.     traZODone (DESYREL) 100 MG tablet Take 1 tablet (100 mg total) by mouth at bedtime. 30 tablet 0   Musculoskeletal: Strength & Muscle Tone: within normal limits Gait & Station: normal Patient leans: N/A  Psychiatric Specialty Exam:  Presentation  General Appearance: Casual  Eye Contact:Good  Speech:Clear and Coherent; Normal Rate  Speech Volume:Normal  Handedness:No data recorded  Mood and Affect  Mood:Euthymic  Affect:Appropriate; Congruent  Thought Process  Thought Processes:Coherent; Goal Directed  Descriptions of Associations:Intact  Orientation:Full (Time, Place and Person)  Thought Content:Logical  History of Schizophrenia/Schizoaffective disorder:No data recorded Duration of Psychotic Symptoms:No data recorded Hallucinations:Hallucinations: None  Ideas of Reference:None  Suicidal Thoughts:Suicidal Thoughts: No  Homicidal Thoughts:Homicidal Thoughts: No  Sensorium  Memory:Immediate Good; Recent Good; Remote Good  Judgment:Fair  Insight:Present  Executive Functions  Concentration:Good  Attention Span:Good  Recall:Good  Fund of Knowledge:Fair  Language:Fair  Psychomotor Activity  Psychomotor Activity:Psychomotor Activity: Normal  Assets  Assets:Communication Skills; Financial Resources/Insurance; Physical Health; Social Support  Sleep  Sleep:Sleep: Good  Physical Exam: Physical Exam Vitals and nursing note reviewed.  Constitutional:      Appearance: He is normal weight.  HENT:     Head: Normocephalic.     Nose: Nose normal.     Mouth/Throat:      Mouth: Mucous membranes are moist.     Pharynx: Oropharynx is clear.  Eyes:     Pupils: Pupils are equal, round, and reactive to light.  Cardiovascular:     Rate and Rhythm: Normal rate.     Pulses: Normal pulses.  Pulmonary:     Effort: Pulmonary effort is normal.  Abdominal:     General: Abdomen is flat.  Musculoskeletal:        General: Normal range of motion.     Cervical back: Normal range of motion.  Skin:    General: Skin is warm and dry.  Neurological:     Mental Status: He is alert and oriented to person, place, and time. Mental status is at baseline.  Psychiatric:        Attention and Perception: Attention and perception normal.        Mood and Affect: Mood and affect normal.        Speech: Speech  normal.        Behavior: Behavior normal. Behavior is cooperative.        Thought Content: Thought content normal. Thought content is not paranoid or delusional. Thought content does not include homicidal or suicidal ideation. Thought content does not include homicidal or suicidal plan.        Cognition and Memory: Cognition and memory normal.        Judgment: Judgment normal.   Review of Systems  Psychiatric/Behavioral:  Positive for substance abuse. Negative for depression, hallucinations, memory loss and suicidal ideas. The patient is not nervous/anxious and does not have insomnia.   All other systems reviewed and are negative. Blood pressure 118/79, pulse 80, temperature 98.6 F (37 C), temperature source Oral, resp. rate 18, SpO2 100 %. There is no height or weight on file to calculate BMI.  Treatment Plan Summary: Plan Discharge with self-care; outpatient resources for therapy and substance abuse for individual follow-up. Patient's payee/niece aware of plan.   Disposition: No evidence of imminent risk to self or others at present.   Patient does not meet criteria for psychiatric inpatient admission. Supportive therapy provided about ongoing stressors. Discussed crisis  plan, support from social network, calling 911, coming to the Emergency Department, and calling Suicide Hotline.  This service was provided via telemedicine using a 2-way, interactive audio and video technology.  Names of all persons participating in this telemedicine service and their role in this encounter. Name: Oneida Alar Role: PMHNP  Name: [place] Abbot Role: Attending MD  Name: Milton Ferguson Role: patient   Name: [name] Role: niece/payee    Inda Merlin, NP 05/07/2021 12:21 PM

## 2021-05-07 NOTE — ED Notes (Signed)
Patient calm and cooperative at this time.

## 2021-05-07 NOTE — ED Provider Notes (Signed)
Emergency Medicine Observation Re-evaluation Note  Keith Murray is a 61 y.o. male, seen on rounds today.  Pt initially presented to the ED for complaints of No chief complaint on file. Currently, the patient is sitting up in bed.  Physical Exam  BP 118/79 (BP Location: Right Arm)    Pulse 80    Temp 98.6 F (37 C) (Oral)    Resp 18    SpO2 100%  Physical Exam General: calm Cardiac: normal rate Lungs: no increased WOB Psych: calm  ED Course / MDM  EKG:EKG Interpretation  Date/Time:  Thursday May 06 2021 11:42:21 EST Ventricular Rate:  75 PR Interval:  148 QRS Duration: 86 QT Interval:  400 QTC Calculation: 446 R Axis:   76 Text Interpretation: Normal sinus rhythm Normal ECG When compared with ECG of 01-Nov-2015 06:04, PREVIOUS ECG IS PRESENT Confirmed by Dene Gentry [phone]) on 05/06/2021 12:06:28 PM  I have reviewed the labs performed to date as well as medications administered while in observation.  Recent changes in the last 24 hours include none.  Plan  Current plan is for placement to geri-psych. Keith Murray is under involuntary commitment.      Malvin Johns, MD 05/07/21 [phone]

## 2021-05-07 NOTE — ED Notes (Addendum)
Patient informed of eta on transport home

## 2021-05-07 NOTE — Progress Notes (Signed)
Pt got up to come to his room door to look out and then went back to bed.

## 2021-05-07 NOTE — Discharge Instructions (Addendum)
For your behavioral health needs you are advised to follow up with the [facility].  They offer psychiatry, therapy and treatment for substance use disorders.  Contact them at your earliest opportunity to schedule an intake appointment:       [facility]      [place], [place] [address]      [phone]

## 2021-05-07 NOTE — Progress Notes (Signed)
Pt is laying down sleep in his bed

## 2021-06-16 IMAGING — CT CT ABD-PELV W/ CM
2 of 5 series · 13 of 36 positions shown, 16 images · IV contrast (OMNIPAQUE)
Comparison: 07/19/2019

CLINICAL DATA: Lung cancer staging, chemotherapy and XRT complete

EXAM:
CT CHEST, ABDOMEN, AND PELVIS WITH CONTRAST
TECHNIQUE: Multidetector CT imaging of the chest, abdomen and pelvis was
performed following the standard protocol during bolus
administration of intravenous contrast.
CONTRAST:  100mL OMNIPAQUE IOHEXOL 300 MG/ML SOLN, additional oral
enteric contrast

[Series 2: cap with · axial · 0.72mm/px · z∈[-642,-102]mm · 10 of 134 slices shown, 13 images]
[im 13/134  mediastinal]
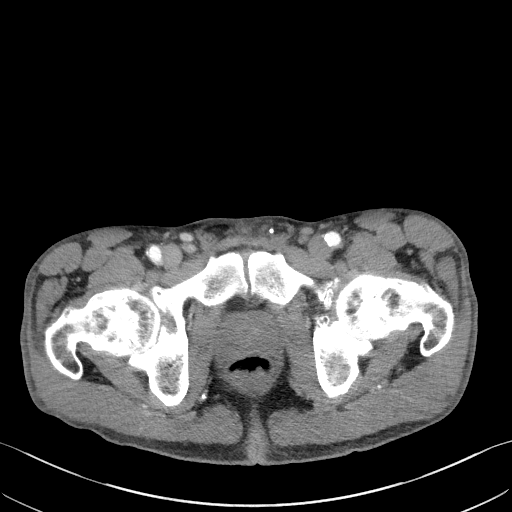
[im 13/134  lung]
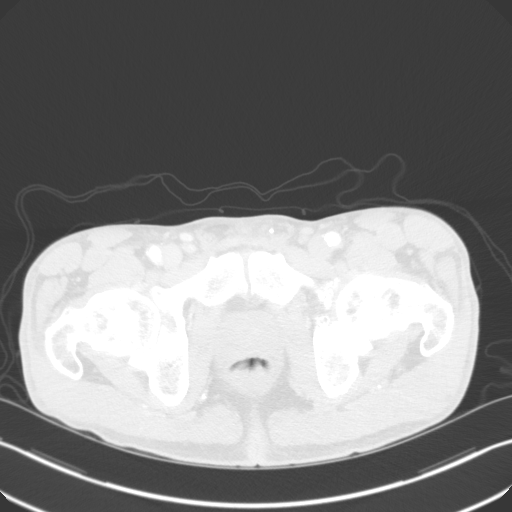
[im 25/134  lung]
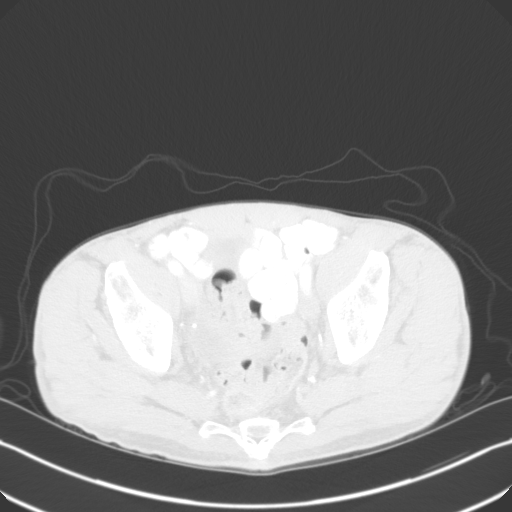
[im 37/134  lung]
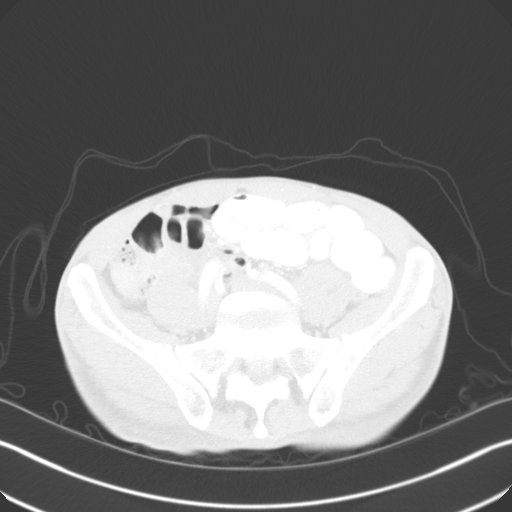
[im 49/134  lung]
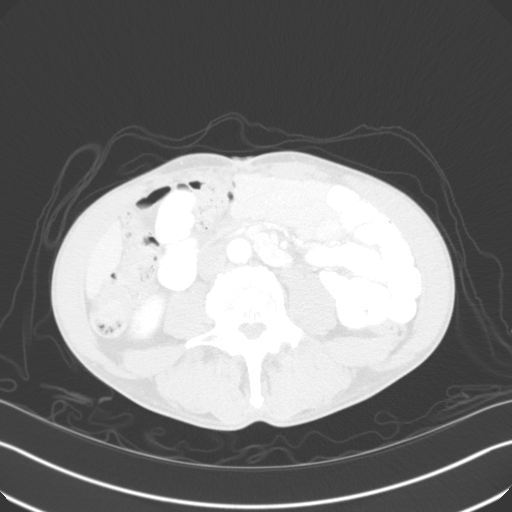
[im 61/134  mediastinal]
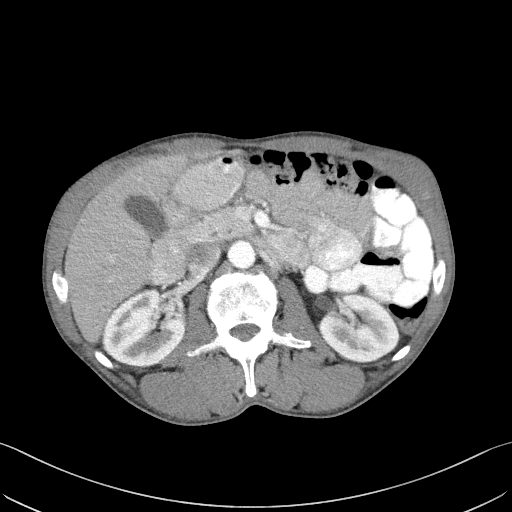
[im 61/134  lung]
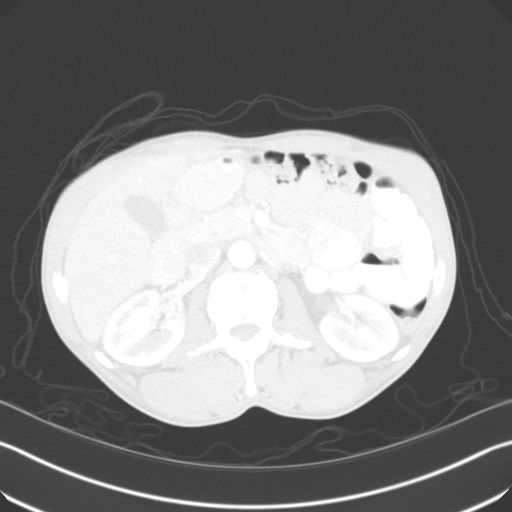
[im 73/134  lung]
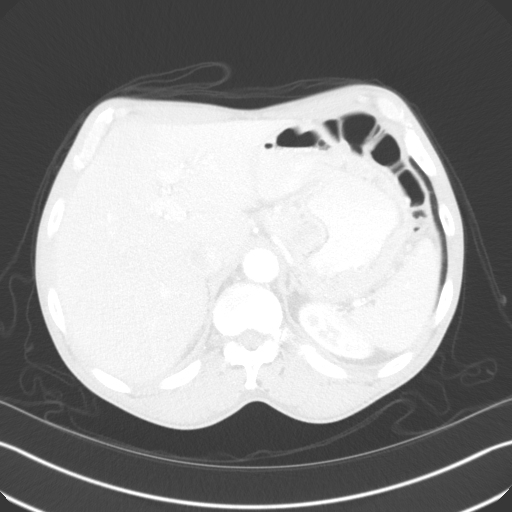
[im 85/134  lung]
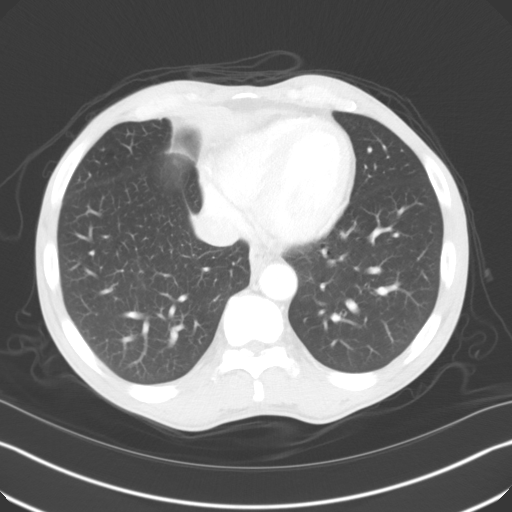
[im 97/134  lung]
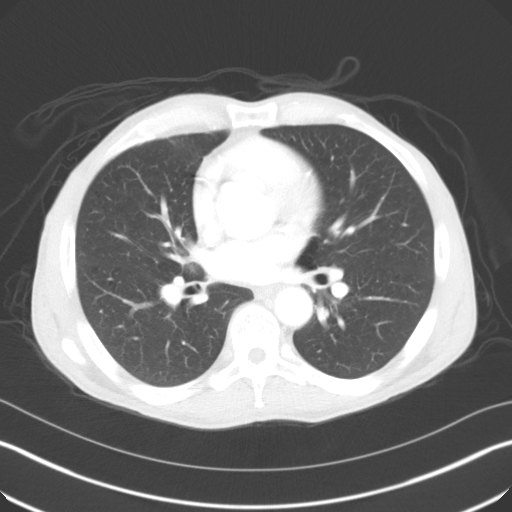
[im 109/134  mediastinal]
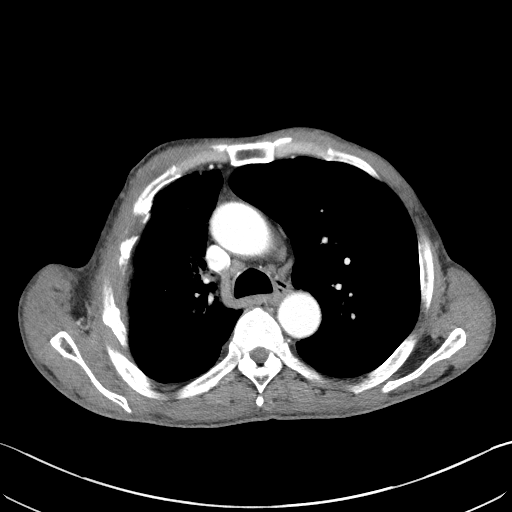
[im 109/134  lung]
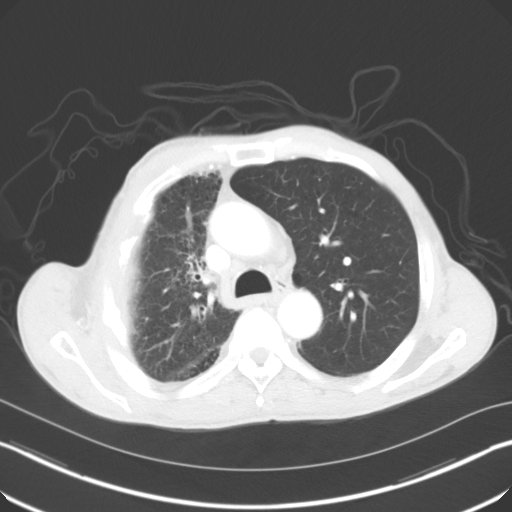
[im 121/134  lung]
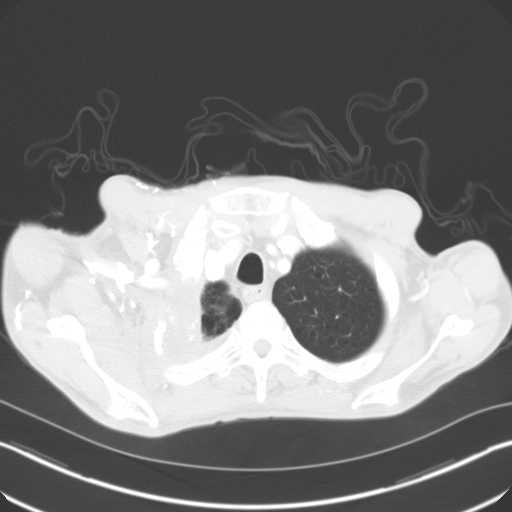

[Series 4: coronals · coronal · 0.82mm/px · 3 of 155 slices shown]
[im 31/155  lung]
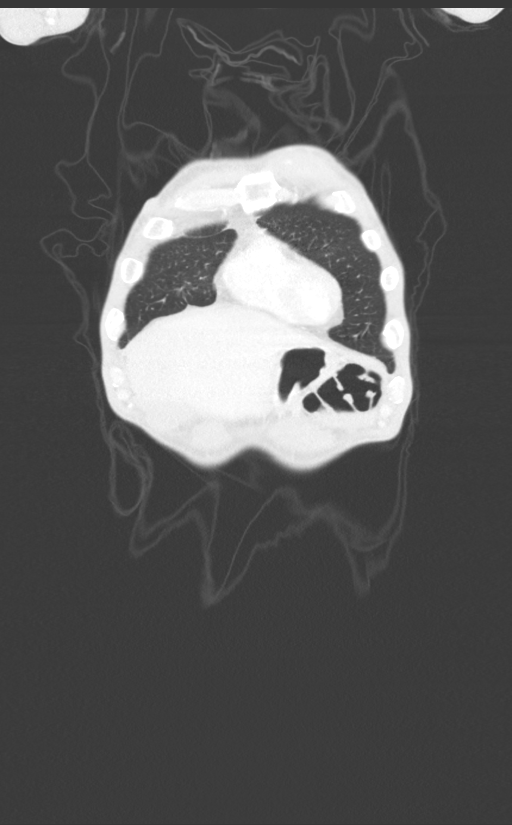
[im 62/155  lung]
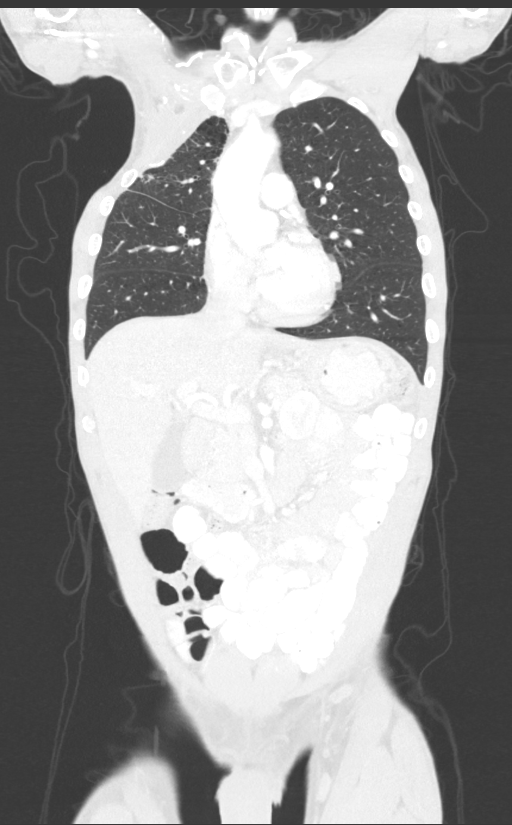
[im 93/155  lung]
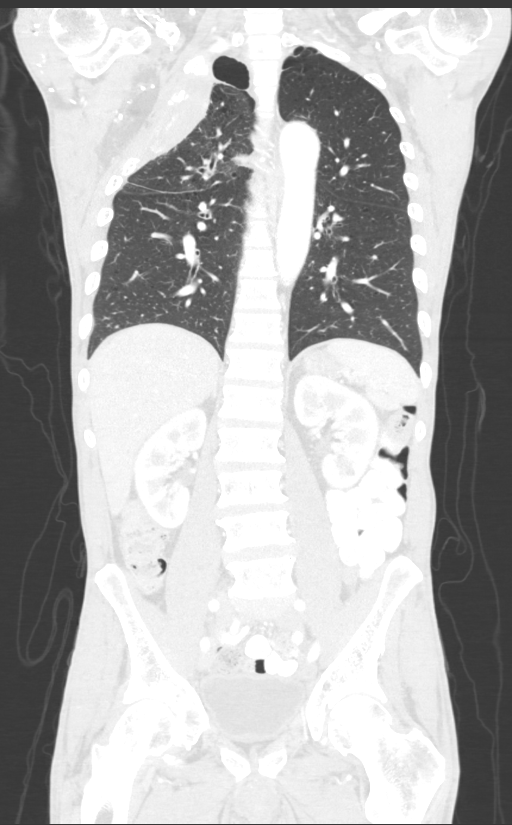

[13 of 36 positions shown; findings below may reference images not displayed]

FINDINGS: CT CHEST FINDINGS

Cardiovascular: Normal heart size. Three-vessel coronary artery
calcifications. No pericardial effusion.

Mediastinum/Nodes: No enlarged mediastinal, hilar, or axillary lymph
nodes. Thyroid gland, trachea, and esophagus demonstrate no
significant findings.

Lungs/Pleura: Unchanged post treatment appearance of the chest with
volume loss of the right apex and a partially calcified pleural or
chest wall mass of the apical right hemithorax measuring
approximately 7.5 x 6.8 x 2.7 cm (series 2, image 17). Mild
centrilobular emphysema. Diffuse bilateral bronchial wall
thickening. No pleural effusion or pneumothorax.

Musculoskeletal: Unchanged mixed lytic and sclerotic destruction of
the right first through fourth ribs.

CT ABDOMEN PELVIS FINDINGS

Hepatobiliary: No solid liver abnormality is seen. No gallstones,
gallbladder wall thickening, or biliary dilatation.

Pancreas: Unremarkable. No pancreatic ductal dilatation or
surrounding inflammatory changes.

Spleen: Normal in size without significant abnormality.

Adrenals/Urinary Tract: Adrenal glands are unremarkable. Kidneys are
normal, without renal calculi, solid lesion, or hydronephrosis.
Bladder is unremarkable.

Stomach/Bowel: Stomach is within normal limits. No evidence of bowel
wall thickening, distention, or inflammatory changes.

Vascular/Lymphatic: Aortic atherosclerosis. No enlarged abdominal or
pelvic lymph nodes.

Reproductive: No mass or other abnormality.

Other: No abdominal wall hernia or abnormality. No abdominopelvic
ascites.

Musculoskeletal: No acute or significant osseous findings.
IMPRESSION: 1. Unchanged post treatment appearance of the chest with volume loss
of the right apex and a partially calcified pleural or chest wall
mass of the apical right hemithorax.
2. Unchanged mixed lytic and sclerotic destruction of the right
first through fourth ribs.
3. No evidence of metastatic disease within the abdomen or pelvis.
4. Emphysema (0EWXB-OJW.9). Diffuse bilateral bronchial wall
thickening.
5. Coronary artery disease. Aortic Atherosclerosis (0EWXB-88M.M).

## 2022-01-27 ENCOUNTER — Emergency Department (HOSPITAL_COMMUNITY)
Admission: EM | Admit: 2022-01-27 | Discharge: 2022-01-28 | Disposition: A | Discharge status: Home / Self Care | Payer: Medicare Other | Attending: Emergency Medicine | Admitting: Emergency Medicine

## 2022-01-27 ENCOUNTER — Other Ambulatory Visit: Payer: Self-pay

## 2022-01-27 ENCOUNTER — Encounter (HOSPITAL_COMMUNITY): Payer: Self-pay | Admitting: Emergency Medicine

## 2022-01-27 DIAGNOSIS — F10920 Alcohol use, unspecified with intoxication, uncomplicated: Secondary | ICD-10-CM

## 2022-01-27 DIAGNOSIS — Y908 Blood alcohol level of 240 mg/100 ml or more: Secondary | ICD-10-CM | POA: Insufficient documentation

## 2022-01-27 DIAGNOSIS — F10129 Alcohol abuse with intoxication, unspecified: Secondary | ICD-10-CM | POA: Insufficient documentation

## 2022-01-27 DIAGNOSIS — R9431 Abnormal electrocardiogram [ECG] [EKG]: Secondary | ICD-10-CM | POA: Insufficient documentation

## 2022-01-27 DIAGNOSIS — Z85118 Personal history of other malignant neoplasm of bronchus and lung: Secondary | ICD-10-CM | POA: Diagnosis not present

## 2022-01-27 DIAGNOSIS — F1490 Cocaine use, unspecified, uncomplicated: Secondary | ICD-10-CM | POA: Diagnosis not present

## 2022-01-27 DIAGNOSIS — F10120 Alcohol abuse with intoxication, uncomplicated: Secondary | ICD-10-CM | POA: Insufficient documentation

## 2022-01-27 DIAGNOSIS — F1290 Cannabis use, unspecified, uncomplicated: Secondary | ICD-10-CM | POA: Diagnosis not present

## 2022-01-27 DIAGNOSIS — R4781 Slurred speech: Secondary | ICD-10-CM | POA: Diagnosis not present

## 2022-01-27 DIAGNOSIS — F259 Schizoaffective disorder, unspecified: Secondary | ICD-10-CM | POA: Diagnosis not present

## 2022-01-27 DIAGNOSIS — M79604 Pain in right leg: Secondary | ICD-10-CM | POA: Insufficient documentation

## 2022-01-27 DIAGNOSIS — Z79899 Other long term (current) drug therapy: Secondary | ICD-10-CM | POA: Insufficient documentation

## 2022-01-27 LAB — RAPID URINE DRUG SCREEN, HOSP PERFORMED
Amphetamines: NOT DETECTED
Barbiturates: NOT DETECTED
Benzodiazepines: NOT DETECTED
Cocaine: POSITIVE — AB
Opiates: NOT DETECTED
Tetrahydrocannabinol: POSITIVE — AB

## 2022-01-27 LAB — SALICYLATE LEVEL: Salicylate Lvl: <7 mg/dL — ABNORMAL LOW (ref 7.0–30.0)

## 2022-01-27 LAB — CBC WITH DIFFERENTIAL/PLATELET
Abs Immature Granulocytes: 0.01 10*3/uL (ref 0.00–0.07)
Basophils Absolute: 0 10*3/uL (ref 0.0–0.1)
Basophils Relative: 1 %
Eosinophils Absolute: 0 10*3/uL (ref 0.0–0.5)
Eosinophils Relative: 0 %
HCT: 42.1 % (ref 39.0–52.0)
Hemoglobin: 14.3 g/dL (ref 13.0–17.0)
Immature Granulocytes: 0 %
Lymphocytes Relative: 31 %
Lymphs Abs: 1.7 10*3/uL (ref 0.7–4.0)
MCH: 32.6 pg (ref 26.0–34.0)
MCHC: 34 g/dL (ref 30.0–36.0)
MCV: 96.1 fL (ref 80.0–100.0)
Monocytes Absolute: 0.5 10*3/uL (ref 0.1–1.0)
Monocytes Relative: 9 %
Neutro Abs: 3.3 10*3/uL (ref 1.7–7.7)
Neutrophils Relative %: 59 %
Platelets: 225 10*3/uL (ref 150–400)
RBC: 4.38 MIL/uL (ref 4.22–5.81)
RDW: 13.6 % (ref 11.5–15.5)
WBC: 5.6 10*3/uL (ref 4.0–10.5)
nRBC: 0 % (ref 0.0–0.2)

## 2022-01-27 LAB — COMPREHENSIVE METABOLIC PANEL
ALT: 24 U/L (ref 0–44)
AST: 33 U/L (ref 15–41)
Albumin: 4 g/dL (ref 3.5–5.0)
Alkaline Phosphatase: 97 U/L (ref 38–126)
Anion gap: 14 (ref 5–15)
BUN: 10 mg/dL (ref 6–20)
CO2: 19 mmol/L — ABNORMAL LOW (ref 22–32)
Calcium: 8.7 mg/dL — ABNORMAL LOW (ref 8.9–10.3)
Chloride: 106 mmol/L (ref 98–111)
Creatinine, Ser: 0.85 mg/dL (ref 0.61–1.24)
GFR, Estimated: >60 mL/min (ref >60)
Glucose, Bld: 85 mg/dL (ref 70–99)
Potassium: 3.8 mmol/L (ref 3.5–5.1)
Sodium: 139 mmol/L (ref 135–145)
Total Bilirubin: 0.7 mg/dL (ref 0.3–1.2)
Total Protein: 7.2 g/dL (ref 6.5–8.1)

## 2022-01-27 LAB — URINALYSIS, ROUTINE W REFLEX MICROSCOPIC
Bacteria, UA: NONE SEEN
Bilirubin Urine: NEGATIVE
Glucose, UA: NEGATIVE mg/dL
Ketones, ur: NEGATIVE mg/dL
Leukocytes,Ua: NEGATIVE
Nitrite: NEGATIVE
Protein, ur: NEGATIVE mg/dL
Specific Gravity, Urine: 1.003 — ABNORMAL LOW (ref 1.005–1.030)
pH: 6 (ref 5.0–8.0)

## 2022-01-27 LAB — CBG MONITORING, ED: Glucose-Capillary: 83 mg/dL (ref 70–99)

## 2022-01-27 LAB — LIPASE, BLOOD: Lipase: 27 U/L (ref 11–51)

## 2022-01-27 LAB — ACETAMINOPHEN LEVEL: Acetaminophen (Tylenol), Serum: <10 ug/mL — ABNORMAL LOW (ref 10–30)

## 2022-01-27 LAB — ETHANOL: Alcohol, Ethyl (B): 310 mg/dL (ref <10)

## 2022-01-27 LAB — MAGNESIUM: Magnesium: 2.1 mg/dL (ref 1.7–2.4)

## 2022-01-27 NOTE — ED Triage Notes (Signed)
Patient arrived via [facility] for alcohol intoxication. Per EMS patient was found in a front someone else front yard, on ground, appears under an influence and verbally aggressive. Patient admitted to drinking alcohol. Patient complaints of right hand pain and right leg pain. Skin abrasion noted on right hand.  Per EMS BP-130/80 HR-88 SpO2-100% on room air CBG-120

## 2022-01-27 NOTE — ED Provider Notes (Signed)
[place] EMERGENCY DEPARTMENT Provider Note   CSN: [id number] Arrival date & time: 01/27/22  2111     History  Chief Complaint  Patient presents with   Alcohol Problem    Roald Lukacs is a 61 y.o. male with stage IV right upper lobe lung cancer, schizoaffective disorder, polysubstance dependence, presents with altered mental status, found down.   Patient was reportedly found down in someone's yard.  EMS stated that this address is very far from the home address listed on file for this patient.  Patient is alert and oriented to self and time but not to place/situation. He is slurring words and cannot provide any history, he does not remember what happened today.  Patient states that he has R leg pain at this time over "the whole thing." Denies headache, CP, SOB, abd pain. Endorses drinking "two beers." He denies any focal neurologic deficits, extremity pain, recent illnesses. Does at times endorse CP, other times denies it.     Alcohol Problem      Home Medications Prior to Admission medications   Medication Sig Start Date End Date Taking? Authorizing Provider  benztropine (COGENTIN) 0.5 MG tablet Take 1 tablet (0.5 mg total) by mouth 2 (two) times daily. 05/17/16   Kerrie Buffalo, NP  FLUoxetine (PROZAC) 20 MG capsule Take 1 capsule (20 mg total) by mouth daily. 05/18/16   Kerrie Buffalo, NP  gabapentin (NEURONTIN) 300 MG capsule Take 300 mg by mouth in the morning and at bedtime. 01/03/20   [provider]  haloperidol (HALDOL) 10 MG tablet Take 10 mg by mouth 2 (two) times daily. 01/03/20   [provider]  haloperidol decanoate (HALDOL DECANOATE) 100 MG/ML injection Inject 100 mg into the muscle every 28 (twenty-eight) days. 03/19/21   [provider]  traZODone (DESYREL) 100 MG tablet Take 1 tablet (100 mg total) by mouth at bedtime. 05/17/16   Kerrie Buffalo, NP      Allergies    Patient has no known allergies.    Review of  Systems   Review of Systems  Unable to perform ROS: Mental status change    Physical Exam Updated Vital Signs BP 129/71   Pulse 87   Temp 97.7 F (36.5 C) (Oral)   Resp (!) 22   SpO2 97%  Physical Exam General: Disheveled appearing male, sitting up in bed.  HEENT: PERRLA, Sclera anicteric, MMM, trachea midline. [place]/AT, no signs of head trauma, no skull depressions/deformities, no midline C-spine tenderness or stepoffs. Stable nasal bridge and midface. Poor dentition.  Cardiology: RRR, no murmurs/rubs/gallops. BL radial and DP pulses equal bilaterally.  Resp: Normal respiratory rate and effort. CTAB, no wheezes, rhonchi, crackles.  Abd: Soft, non-tender, non-distended. No rebound tenderness or guarding.  GU: No blood at urethral meatus. MSK: No peripheral edema or signs of trauma. Extremities without deformity or TTP. No cyanosis or clubbing. Skin: warm, dry. No rashes or lesions. Back: No CVA tenderness. No midline T or L spine tenderness, no signs of back trauma.  Neuro: A&Ox2, CNs II-XII grossly intact. MAEs. Sensation grossly intact. Slurred speech. Psych: Intoxicated appearing, intermittently cooperative and intermittently agitated yelling vulgar things at male nurses  ED Results / Procedures / Treatments   Labs (all labs ordered are listed, but only abnormal results are displayed) Labs Reviewed  COMPREHENSIVE METABOLIC PANEL - Abnormal; Notable for the following components:      Result Value   CO2 19 (*)    Calcium 8.7 (*)  All other components within normal limits  ETHANOL - Abnormal; Notable for the following components:   Alcohol, Ethyl (B) 310 (*)    All other components within normal limits  ACETAMINOPHEN LEVEL - Abnormal; Notable for the following components:   Acetaminophen (Tylenol), Serum <10 (*)    All other components within normal limits  SALICYLATE LEVEL - Abnormal; Notable for the following components:   Salicylate Lvl <0.3 (*)    All other components  within normal limits  RAPID URINE DRUG SCREEN, HOSP PERFORMED - Abnormal; Notable for the following components:   Cocaine POSITIVE (*)    Tetrahydrocannabinol POSITIVE (*)    All other components within normal limits  URINALYSIS, ROUTINE W REFLEX MICROSCOPIC - Abnormal; Notable for the following components:   Color, Urine STRAW (*)    Specific Gravity, Urine 1.003 (*)    Hgb urine dipstick SMALL (*)    All other components within normal limits  CBC WITH DIFFERENTIAL/PLATELET  MAGNESIUM  LIPASE, BLOOD  CBG MONITORING, ED  TROPONIN I (HIGH SENSITIVITY)    EKG EKG Interpretation  Date/Time:  Thursday January 27 2022 23:03:32 EDT Ventricular Rate:  77 PR Interval:  138 QRS Duration: 103 QT Interval:  421 QTC Calculation: 477 R Axis:   53 Text Interpretation: Sinus rhythm Borderline prolonged QT interval Confirmed by Cindee Lame [phone]) on 01/27/2022 11:12:43 PM  Radiology No results found.  Procedures Procedures    Medications Ordered in ED Medications  ondansetron (ZOFRAN) tablet 4 mg (has no administration in time range)  acetaminophen (TYLENOL) tablet 650 mg (has no administration in time range)  thiamine (VITAMIN B1) injection 100 mg (has no administration in time range)  folic acid (FOLVITE) tablet 1 mg (has no administration in time range)    ED Course/ Medical Decision Making/ A&P                          Medical Decision Making Amount and/or Complexity of Data Reviewed Labs: ordered. Decision-making details documented in ED Course. Radiology: ordered.  Risk Prescription drug management.   Patient is vitally stable, afebrile. Was found down in someone's yard but appears to have been adequately dressed for the cold, no hypothermia on arrival. He is oriented to self and can tell me his birthday but no other information, unable to provide history about what happened today.   Ddx of acute altered mental status or encephalopathy considered but not limited  to: -Intracranial abnormalities such as ICH, hydrocephalus, head trauma - no e/o trauma on exam, no FNDs. In s/o EtOH 310, do not believe patient requires CTH imaging at this time.  -Infection such as UTI, PNA - patient is afebrile, not complaining of any infectious symptoms,  -Toxic ingestion such as EtOH, other illicit substances - EtOH 310 at this time, will obtain UDS. Negative salicylate and acetaminophen. Given history of cocaine use and c/o intermittent chest pain will obtain EKG/trops to evaluate for ACS/arrhythmia -Electrolyte abnormalities or hyper/hypoglycemia  Patient is given thiamine and folate supplementation.   I have personally reviewed and interpreted all labs and imaging. CXR without abnormalities. EKG without signs of ischemia or arrhythmia   Clinical Course as of 01/28/22 0002  Thu Jan 27, 2022  2252 Patient screaming vulgar, harassing comments repeatedly out the door at the nursing station. Informed patient sternly that I will not tolerate this behavior. Will move to sedate patient if his behavior does not stop. [HN]  2253 Alcohol, Ethyl (B)(!!): 310 [HN]  2254 Glucose-Capillary: 83 [HN]  2254 Lipase: 27 [HN]  2254 Magnesium: 2.1 [HN]  2254 CBC with Differential wnl [HN]  2330 Tetrahydrocannabinol(!): POSITIVE [HN]  2330 COCAINE(!): POSITIVE [HN]  2352 Urinalysis, Routine w reflex microscopic Urine, Clean Catch(!) No UTI, ketones [HN]  2353 Glucose-Capillary: 83 [HN]  2353 Patient is signed out to the oncoming ED physician who is made aware of her history, presentation, exam, workup, and plan.  Plan is to await remainder of labs and sober reeval, planned for approximately 4-5 am.   [HN]    Clinical Course User Index [HN] Audley Hose, MD          Final Clinical Impression(s) / ED Diagnoses Final diagnoses:  Alcoholic intoxication without complication [facility])    Rx / DC Orders ED Discharge Orders     None        This note was created using  dictation software, which may contain spelling or grammatical errors.    Audley Hose, MD 02/07/22 [phone]

## 2022-01-28 ENCOUNTER — Emergency Department (HOSPITAL_COMMUNITY): Payer: Medicare Other

## 2022-01-28 LAB — TROPONIN I (HIGH SENSITIVITY)
Troponin I (High Sensitivity): 8 ng/L (ref <18)
Troponin I (High Sensitivity): 8 ng/L (ref <18)

## 2022-01-28 MED ORDER — FOLIC ACID 1 MG PO TABS
1.0000 mg | ORAL_TABLET | Freq: Once | ORAL | Status: AC
  Administered 2022-01-28: 1 mg via ORAL
  Filled 2022-01-28: qty 1

## 2022-01-28 MED ORDER — THIAMINE HCL 100 MG/ML IJ SOLN
100.0000 mg | Freq: Once | INTRAMUSCULAR | Status: AC
  Administered 2022-01-28: 100 mg via INTRAVENOUS
  Filled 2022-01-28: qty 2

## 2022-01-28 MED ORDER — ACETAMINOPHEN 325 MG PO TABS: 650.0000 mg | ORAL_TABLET | ORAL | Status: DC | PRN

## 2022-01-28 MED ORDER — ONDANSETRON HCL 4 MG PO TABS: 4.0000 mg | ORAL_TABLET | Freq: Three times a day (TID) | ORAL | Status: DC | PRN

## 2022-01-28 NOTE — ED Provider Notes (Signed)
I assumed care of this patient.  Please see previous provider note for further details of Hx, PE.  Briefly patient is a 61 y.o. male who presented found down and intoxicated.  +ETOH, Cocaine, and THC Plan to follow up trop and allow him to MTF.  Trops negative x 2 Allowed to MTF. Walked w/o complication. Clinically sober.  The patient appears reasonably screened and/or stabilized for discharge and I doubt any other medical condition or other [facility] requiring further screening, evaluation, or treatment in the ED at this time. I have discussed the findings, Dx and Tx plan with the patient/family who expressed understanding and agree(s) with the plan. Discharge instructions discussed at length. The patient/family was given strict return precautions who verbalized understanding of the instructions. No further questions at time of discharge.  Disposition: Discharge  Condition: Good  ED Discharge Orders     None         Follow Up: Benito Mccreedy, [address] [place] [address] [phone]  Call  to schedule an appointment for close follow up         Rik Wadel, Grayce Sessions, MD 01/28/22 [phone]

## 2022-01-28 NOTE — ED Notes (Signed)
Patient able to walk with steady gate to bathroom.

## 2022-03-21 ENCOUNTER — Emergency Department (HOSPITAL_COMMUNITY): Payer: Medicare Other

## 2022-03-21 ENCOUNTER — Other Ambulatory Visit: Payer: Self-pay

## 2022-03-21 ENCOUNTER — Inpatient Hospital Stay (HOSPITAL_COMMUNITY)
Admission: EM | Admit: 2022-03-21 | Discharge: 2022-04-28 | DRG: 177 | Disposition: E | Payer: Medicare Other | Attending: Internal Medicine | Admitting: Internal Medicine

## 2022-03-21 DIAGNOSIS — R739 Hyperglycemia, unspecified: Secondary | ICD-10-CM | POA: Diagnosis present

## 2022-03-21 DIAGNOSIS — U071 COVID-19: Secondary | ICD-10-CM | POA: Diagnosis present

## 2022-03-21 DIAGNOSIS — I959 Hypotension, unspecified: Secondary | ICD-10-CM | POA: Diagnosis present

## 2022-03-21 DIAGNOSIS — J189 Pneumonia, unspecified organism: Secondary | ICD-10-CM | POA: Diagnosis not present

## 2022-03-21 DIAGNOSIS — Z79899 Other long term (current) drug therapy: Secondary | ICD-10-CM

## 2022-03-21 DIAGNOSIS — R7989 Other specified abnormal findings of blood chemistry: Secondary | ICD-10-CM | POA: Diagnosis not present

## 2022-03-21 DIAGNOSIS — Z681 Body mass index (BMI) 19 or less, adult: Secondary | ICD-10-CM | POA: Diagnosis not present

## 2022-03-21 DIAGNOSIS — Z66 Do not resuscitate: Secondary | ICD-10-CM | POA: Diagnosis not present

## 2022-03-21 DIAGNOSIS — J154 Pneumonia due to other streptococci: Secondary | ICD-10-CM | POA: Diagnosis present

## 2022-03-21 DIAGNOSIS — R600 Localized edema: Secondary | ICD-10-CM | POA: Diagnosis present

## 2022-03-21 DIAGNOSIS — F17210 Nicotine dependence, cigarettes, uncomplicated: Secondary | ICD-10-CM | POA: Diagnosis present

## 2022-03-21 DIAGNOSIS — R0902 Hypoxemia: Secondary | ICD-10-CM | POA: Diagnosis not present

## 2022-03-21 DIAGNOSIS — R571 Hypovolemic shock: Secondary | ICD-10-CM | POA: Diagnosis present

## 2022-03-21 DIAGNOSIS — E440 Moderate protein-calorie malnutrition: Secondary | ICD-10-CM | POA: Diagnosis present

## 2022-03-21 DIAGNOSIS — I2699 Other pulmonary embolism without acute cor pulmonale: Secondary | ICD-10-CM | POA: Diagnosis not present

## 2022-03-21 DIAGNOSIS — R451 Restlessness and agitation: Secondary | ICD-10-CM | POA: Diagnosis not present

## 2022-03-21 DIAGNOSIS — F419 Anxiety disorder, unspecified: Secondary | ICD-10-CM | POA: Diagnosis present

## 2022-03-21 DIAGNOSIS — R54 Age-related physical debility: Secondary | ICD-10-CM | POA: Diagnosis present

## 2022-03-21 DIAGNOSIS — Z86711 Personal history of pulmonary embolism: Secondary | ICD-10-CM | POA: Diagnosis not present

## 2022-03-21 DIAGNOSIS — T380X5A Adverse effect of glucocorticoids and synthetic analogues, initial encounter: Secondary | ICD-10-CM | POA: Diagnosis present

## 2022-03-21 DIAGNOSIS — Z515 Encounter for palliative care: Secondary | ICD-10-CM

## 2022-03-21 DIAGNOSIS — E871 Hypo-osmolality and hyponatremia: Secondary | ICD-10-CM | POA: Diagnosis present

## 2022-03-21 DIAGNOSIS — F259 Schizoaffective disorder, unspecified: Secondary | ICD-10-CM | POA: Diagnosis present

## 2022-03-21 DIAGNOSIS — I10 Essential (primary) hypertension: Secondary | ICD-10-CM | POA: Diagnosis present

## 2022-03-21 DIAGNOSIS — J1282 Pneumonia due to coronavirus disease 2019: Secondary | ICD-10-CM | POA: Diagnosis present

## 2022-03-21 DIAGNOSIS — R531 Weakness: Secondary | ICD-10-CM | POA: Diagnosis not present

## 2022-03-21 DIAGNOSIS — G928 Other toxic encephalopathy: Secondary | ICD-10-CM | POA: Diagnosis present

## 2022-03-21 DIAGNOSIS — J9601 Acute respiratory failure with hypoxia: Secondary | ICD-10-CM | POA: Diagnosis present

## 2022-03-21 DIAGNOSIS — R0600 Dyspnea, unspecified: Secondary | ICD-10-CM

## 2022-03-21 DIAGNOSIS — Z7189 Other specified counseling: Secondary | ICD-10-CM

## 2022-03-21 DIAGNOSIS — F1410 Cocaine abuse, uncomplicated: Secondary | ICD-10-CM | POA: Diagnosis present

## 2022-03-21 DIAGNOSIS — Z85118 Personal history of other malignant neoplasm of bronchus and lung: Secondary | ICD-10-CM

## 2022-03-21 DIAGNOSIS — R079 Chest pain, unspecified: Secondary | ICD-10-CM | POA: Diagnosis not present

## 2022-03-21 DIAGNOSIS — E878 Other disorders of electrolyte and fluid balance, not elsewhere classified: Secondary | ICD-10-CM | POA: Diagnosis present

## 2022-03-21 DIAGNOSIS — M7989 Other specified soft tissue disorders: Secondary | ICD-10-CM | POA: Diagnosis not present

## 2022-03-21 LAB — COMPREHENSIVE METABOLIC PANEL
ALT: 20 U/L (ref 0–44)
AST: 25 U/L (ref 15–41)
Albumin: 3.1 g/dL — ABNORMAL LOW (ref 3.5–5.0)
Alkaline Phosphatase: 154 U/L — ABNORMAL HIGH (ref 38–126)
Anion gap: 13 (ref 5–15)
BUN: 23 mg/dL (ref 8–23)
CO2: 24 mmol/L (ref 22–32)
Calcium: 8.7 mg/dL — ABNORMAL LOW (ref 8.9–10.3)
Chloride: 90 mmol/L — ABNORMAL LOW (ref 98–111)
Creatinine, Ser: 1.13 mg/dL (ref 0.61–1.24)
GFR, Estimated: >60 mL/min (ref >60)
Glucose, Bld: 131 mg/dL — ABNORMAL HIGH (ref 70–99)
Potassium: 3.3 mmol/L — ABNORMAL LOW (ref 3.5–5.1)
Sodium: 127 mmol/L — ABNORMAL LOW (ref 135–145)
Total Bilirubin: 1.4 mg/dL — ABNORMAL HIGH (ref 0.3–1.2)
Total Protein: 8.3 g/dL — ABNORMAL HIGH (ref 6.5–8.1)

## 2022-03-21 LAB — TROPONIN I (HIGH SENSITIVITY): Troponin I (High Sensitivity): 31 ng/L — ABNORMAL HIGH (ref <18)

## 2022-03-21 LAB — RESP PANEL BY RT-PCR (RSV, FLU A&B, COVID)  RVPGX2
Influenza A by PCR: NEGATIVE
Influenza B by PCR: NEGATIVE
Resp Syncytial Virus by PCR: NEGATIVE
SARS Coronavirus 2 by RT PCR: POSITIVE — AB

## 2022-03-21 LAB — CBC WITH DIFFERENTIAL/PLATELET
Abs Immature Granulocytes: 0.46 10*3/uL — ABNORMAL HIGH (ref 0.00–0.07)
Basophils Absolute: 0.1 10*3/uL (ref 0.0–0.1)
Basophils Relative: 0 %
Eosinophils Absolute: 0 10*3/uL (ref 0.0–0.5)
Eosinophils Relative: 0 %
HCT: 35.5 % — ABNORMAL LOW (ref 39.0–52.0)
Hemoglobin: 12.9 g/dL — ABNORMAL LOW (ref 13.0–17.0)
Immature Granulocytes: 2 %
Lymphocytes Relative: 3 %
Lymphs Abs: 0.6 10*3/uL — ABNORMAL LOW (ref 0.7–4.0)
MCH: 32 pg (ref 26.0–34.0)
MCHC: 36.3 g/dL — ABNORMAL HIGH (ref 30.0–36.0)
MCV: 88.1 fL (ref 80.0–100.0)
Monocytes Absolute: 1.1 10*3/uL — ABNORMAL HIGH (ref 0.1–1.0)
Monocytes Relative: 5 %
Neutro Abs: 21.2 10*3/uL — ABNORMAL HIGH (ref 1.7–7.7)
Neutrophils Relative %: 90 %
Platelets: 398 10*3/uL (ref 150–400)
RBC: 4.03 MIL/uL — ABNORMAL LOW (ref 4.22–5.81)
RDW: 13.6 % (ref 11.5–15.5)
WBC: 23.5 10*3/uL — ABNORMAL HIGH (ref 4.0–10.5)
nRBC: 0 % (ref 0.0–0.2)

## 2022-03-21 LAB — I-STAT CHEM 8, ED
BUN: 22 mg/dL (ref 8–23)
Calcium, Ion: 1.07 mmol/L — ABNORMAL LOW (ref 1.15–1.40)
Chloride: 91 mmol/L — ABNORMAL LOW (ref 98–111)
Creatinine, Ser: 1.1 mg/dL (ref 0.61–1.24)
Glucose, Bld: 137 mg/dL — ABNORMAL HIGH (ref 70–99)
HCT: 42 % (ref 39.0–52.0)
Hemoglobin: 14.3 g/dL (ref 13.0–17.0)
Potassium: 3.4 mmol/L — ABNORMAL LOW (ref 3.5–5.1)
Sodium: 126 mmol/L — ABNORMAL LOW (ref 135–145)
TCO2: 25 mmol/L (ref 22–32)

## 2022-03-21 LAB — PROTIME-INR
INR: 1.1 (ref 0.8–1.2)
Prothrombin Time: 14.2 seconds (ref 11.4–15.2)

## 2022-03-21 LAB — APTT: aPTT: 34 seconds (ref 24–36)

## 2022-03-21 LAB — BLOOD GAS, VENOUS
Acid-Base Excess: 2.7 mmol/L — ABNORMAL HIGH (ref 0.0–2.0)
Bicarbonate: 27.9 mmol/L (ref 20.0–28.0)
O2 Saturation: 47.1 %
Patient temperature: 37
pCO2, Ven: 44 mmHg (ref 44–60)
pH, Ven: 7.41 (ref 7.25–7.43)
pO2, Ven: <31 mmHg — CL (ref 32–45)

## 2022-03-21 LAB — BRAIN NATRIURETIC PEPTIDE: B Natriuretic Peptide: 297 pg/mL — ABNORMAL HIGH (ref 0.0–100.0)

## 2022-03-21 LAB — LACTIC ACID, PLASMA: Lactic Acid, Venous: 2.1 mmol/L (ref 0.5–1.9)

## 2022-03-21 LAB — ETHANOL: Alcohol, Ethyl (B): <10 mg/dL (ref <10)

## 2022-03-21 MED ORDER — DEXAMETHASONE SODIUM PHOSPHATE 10 MG/ML IJ SOLN
6.0000 mg | INTRAMUSCULAR | Status: DC
  Administered 2022-03-22: 6 mg via INTRAVENOUS
  Filled 2022-03-21: qty 1

## 2022-03-21 MED ORDER — ACETAMINOPHEN 325 MG PO TABS
650.0000 mg | ORAL_TABLET | Freq: Four times a day (QID) | ORAL | Status: DC | PRN
  Administered 2022-03-23 – 2022-03-24 (×3): 650 mg via ORAL
  Filled 2022-03-21 (×3): qty 2

## 2022-03-21 MED ORDER — HALOPERIDOL 1 MG PO TABS
10.0000 mg | ORAL_TABLET | Freq: Two times a day (BID) | ORAL | Status: DC
  Administered 2022-03-22: 10 mg via ORAL
  Filled 2022-03-21 (×3): qty 10

## 2022-03-21 MED ORDER — DOXYCYCLINE HYCLATE 100 MG PO TABS: 100.0000 mg | ORAL_TABLET | Freq: Once | ORAL | Status: DC

## 2022-03-21 MED ORDER — FLUOXETINE HCL 20 MG PO CAPS
20.0000 mg | ORAL_CAPSULE | Freq: Every day | ORAL | Status: DC
  Administered 2022-03-23 – 2022-03-24 (×2): 20 mg via ORAL
  Filled 2022-03-21 (×3): qty 1

## 2022-03-21 MED ORDER — SODIUM CHLORIDE 0.9 % IV SOLN
100.0000 mg | Freq: Every day | INTRAVENOUS | Status: DC
  Filled 2022-03-21: qty 20

## 2022-03-21 MED ORDER — SODIUM CHLORIDE 0.9 % IV BOLUS
1000.0000 mL | Freq: Once | INTRAVENOUS | Status: AC
  Administered 2022-03-21: 1000 mL via INTRAVENOUS

## 2022-03-21 MED ORDER — GUAIFENESIN-DM 100-10 MG/5ML PO SYRP
10.0000 mL | ORAL_SOLUTION | ORAL | Status: DC | PRN
  Administered 2022-03-23: 10 mL via ORAL
  Filled 2022-03-21: qty 10

## 2022-03-21 MED ORDER — SODIUM CHLORIDE 0.9 % IV SOLN
200.0000 mg | Freq: Once | INTRAVENOUS | Status: DC
  Filled 2022-03-21: qty 40

## 2022-03-21 MED ORDER — ENOXAPARIN SODIUM 40 MG/0.4ML IJ SOSY
40.0000 mg | PREFILLED_SYRINGE | INTRAMUSCULAR | Status: DC
  Administered 2022-03-22 – 2022-03-27 (×6): 40 mg via SUBCUTANEOUS
  Filled 2022-03-21 (×8): qty 0.4

## 2022-03-21 MED ORDER — HYDROCOD POLI-CHLORPHE POLI ER 10-8 MG/5ML PO SUER: 5.0000 mL | Freq: Two times a day (BID) | ORAL | Status: DC | PRN

## 2022-03-21 MED ORDER — SODIUM CHLORIDE 0.9 % IV SOLN: INTRAVENOUS | Status: DC

## 2022-03-21 MED ORDER — TRAZODONE HCL 50 MG PO TABS
100.0000 mg | ORAL_TABLET | Freq: Every day | ORAL | Status: DC
  Administered 2022-03-22 – 2022-03-24 (×4): 100 mg via ORAL
  Filled 2022-03-21: qty 2
  Filled 2022-03-21: qty 1
  Filled 2022-03-21 (×2): qty 2

## 2022-03-21 MED ORDER — LACTATED RINGERS IV SOLN: INTRAVENOUS | Status: DC

## 2022-03-21 MED ORDER — SODIUM CHLORIDE 0.9 % IV SOLN
1.0000 g | Freq: Once | INTRAVENOUS | Status: AC
  Administered 2022-03-21: 1 g via INTRAVENOUS
  Filled 2022-03-21: qty 10

## 2022-03-21 MED ORDER — INSULIN ASPART 100 UNIT/ML IJ SOLN
0.0000 [IU] | INTRAMUSCULAR | Status: DC
  Filled 2022-03-21: qty 0.2

## 2022-03-21 MED ORDER — BENZTROPINE MESYLATE 0.5 MG PO TABS
0.5000 mg | ORAL_TABLET | Freq: Two times a day (BID) | ORAL | Status: DC
  Administered 2022-03-22 – 2022-03-28 (×13): 0.5 mg via ORAL
  Filled 2022-03-21 (×14): qty 1

## 2022-03-21 MED ORDER — TOCILIZUMAB 400 MG/20ML IV SOLN
8.0000 mg/kg | Freq: Once | INTRAVENOUS | Status: AC
  Administered 2022-03-22: 544 mg via INTRAVENOUS
  Filled 2022-03-21: qty 20

## 2022-03-21 MED ORDER — GABAPENTIN 300 MG PO CAPS
300.0000 mg | ORAL_CAPSULE | Freq: Every day | ORAL | Status: DC
  Administered 2022-03-22 – 2022-03-24 (×4): 300 mg via ORAL
  Filled 2022-03-21 (×4): qty 1

## 2022-03-21 MED ORDER — IPRATROPIUM-ALBUTEROL 20-100 MCG/ACT IN AERS: 1.0000 | INHALATION_SPRAY | Freq: Four times a day (QID) | RESPIRATORY_TRACT | Status: DC | PRN

## 2022-03-21 MED ORDER — SODIUM CHLORIDE 0.9 % IV SOLN
100.0000 mg | Freq: Two times a day (BID) | INTRAVENOUS | Status: DC
  Administered 2022-03-21 – 2022-03-22 (×3): 100 mg via INTRAVENOUS
  Filled 2022-03-21 (×4): qty 100

## 2022-03-21 NOTE — Progress Notes (Signed)
Moved on BiPAP to room WA18 without event. Patient demands mask be removed from face. Placed on 5L Nasal O2 to obtain SpO2 90%. Patient demands that RT "get out of my room and don't come back". RN at bedside as witness to patient being loud, rude, cursing and obnoxious to RT. Patient spontaneous RR high 50's without BiPAP. He then yelled at RT "I'll wear it". RT placed back on BiPAP and documented accordingly. Patient then agreeable to RT coming back to his room.

## 2022-03-21 NOTE — ED Notes (Signed)
20g IV established, ready for CTA.

## 2022-03-21 NOTE — H&P (Signed)
NAME:  Keith Murray, MRN:  [id number], DOB:  [birth year], LOS: 0 ADMISSION DATE:  03/13/2022, CONSULTATION DATE:  03/05/2022 REFERRING MD:  Dr. Langston Masker, ER, CHIEF COMPLAINT:  SOB   History of Present Illness:  61 yo male smoker presented to [facility] ER with 4 days of dyspnea and cough.  Found to have SpO2 67% on RA.  CXR showed multifocal pneumonia and tested positive for COVID 19.  Started on Bipap in ER.  PCCM asked to assess for ICU admission.  Pertinent  Medical History  Anxiety, Lung cancer, Cocaine abuse, ETOH, Schizoaffective disorder  Significant Hospital Events: Including procedures, antibiotic start and stop dates in addition to other pertinent events   12/25 Admit, start Bipap, start remdesivir/tocilizumab/decadron  Interim History / Subjective:  Feels like Bipap is helping.  Still has cough.  Denies abdominal pain.  No hx of diverticular disease.  Objective   Blood pressure 119/88, pulse (!) 115, resp. rate (!) 35, height 6' 1" (1.854 m), weight 68 kg, SpO2 99 %.    FiO2 (%):  [50 %] 50 %  No intake or output data in the 24 hours ending 03/10/2022 2238 Filed Weights   03/01/2022 2026  Weight: 68 kg    Examination:  General - alert Eyes - pupils reactive ENT - Bipap mask on Cardiac - regular rate/rhythm, no murmur Chest - faint scattered rhonchi Abdomen - soft, non tender, + bowel sounds Extremities - no cyanosis, clubbing, or edema Skin - no rashes Neuro - normal strength, moves extremities, follows commands Psych - normal mood and behavior  Resolved Hospital Problem list     Assessment & Plan:   Acute hypoxic respiratory failure 2nd to COVID 19 pneumonia. - admit to ICU - continue Bipap - monitor need for intubation - day 1 of decadron, remdesivir - will treat with tocilizumab - f/u CXR intermittently - goal SpO2 > 90% - prn antitussives and combivent  Hx of schizoaffective disorder. Hx of polysubstance abuse. - continue cogentin, prozac, neurontin,  haldol, trazodone  Hx of lung cancer with continued tobacco abuse. - followed by Dr. Julien Nordmann with oncology  Hyponatremia. - NS IV fluid at 75 ml/hr - f/u BMET  Steroid induced hyperglycemia. - SSI  Best Practice (right click and "Reselect all SmartList Selections" daily)   Diet/type: NPO w/ oral meds DVT prophylaxis: LMWH GI prophylaxis: N/A Lines: N/A Foley:  N/A Code Status:  full code Last date of multidisciplinary goals of care discussion [x]  Labs   CBC: Recent Labs  Lab 03/13/2022 2057 03/19/2022 2058  WBC 23.5*  --   NEUTROABS 21.2*  --   HGB 12.9* 14.3  HCT 35.5* 42.0  MCV 88.1  --   PLT 398  --     Basic Metabolic Panel: Recent Labs  Lab 03/08/2022 2057 02/28/2022 2058  NA 127* 126*  K 3.3* 3.4*  CL 90* 91*  CO2 24  --   GLUCOSE 131* 137*  BUN 23 22  CREATININE 1.13 1.10  CALCIUM 8.7*  --    GFR: Estimated Creatinine Clearance: 67.8 mL/min (by C-G formula based on SCr of 1.1 mg/dL). Recent Labs  Lab 03/13/2022 2057 02/28/2022 2115  WBC 23.5*  --   LATICACIDVEN  --  2.1*    Liver Function Tests: Recent Labs  Lab 03/25/2022 2057  AST 25  ALT 20  ALKPHOS 154*  BILITOT 1.4*  PROT 8.3*  ALBUMIN 3.1*   No results for input(s): "LIPASE", "AMYLASE" in the last 168 hours. No results  for input(s): "AMMONIA" in the last 168 hours.  ABG    Component Value Date/Time   HCO3 27.9 03/19/2022 2051   TCO2 25 03/03/2022 2058   O2SAT 47.1 03/07/2022 2051     Coagulation Profile: Recent Labs  Lab 03/02/2022 2057  INR 1.1    Cardiac Enzymes: No results for input(s): "CKTOTAL", "CKMB", "CKMBINDEX", "TROPONINI" in the last 168 hours.  HbA1C: Hgb A1c MFr Bld  Date/Time Value Ref Range Status  05/17/2016 06:19 AM 5.1 4.8 - 5.6 % Final    Comment:    (NOTE)         Pre-diabetes: 5.7 - 6.4         Diabetes: >6.4         Glycemic control for adults with diabetes: <7.0     CBG: No results for input(s): "GLUCAP" in the last 168 hours.  Review of  Systems:   Reviewed and negative  Past Medical History:  He,  has a past medical history of Anxiety, met lung ca (dx'd 10/2015), Polysubstance abuse ([place]), Psychosis ([place]), and Schizoaffective disorder ([place]).   Surgical History:   Past Surgical History:  Procedure Laterality Date   HERNIA REPAIR       Social History:   reports that he has been smoking cigarettes. He has a 1.00 pack-year smoking history. He has never used smokeless tobacco. He reports current alcohol use. He reports current drug use. Drugs: Cocaine and Marijuana.   Family History:  His family history is negative for Cancer and Schizophrenia.   Allergies No Known Allergies   Home Medications  Prior to Admission medications   Medication Sig Start Date End Date Taking? Authorizing Provider  benztropine (COGENTIN) 0.5 MG tablet Take 1 tablet (0.5 mg total) by mouth 2 (two) times daily. 05/17/16   Kerrie Buffalo, NP  FLUoxetine (PROZAC) 20 MG capsule Take 1 capsule (20 mg total) by mouth daily. 05/18/16   Kerrie Buffalo, NP  gabapentin (NEURONTIN) 300 MG capsule Take 300 mg by mouth in the morning and at bedtime. 01/03/20   [provider]  haloperidol (HALDOL) 10 MG tablet Take 10 mg by mouth 2 (two) times daily. 01/03/20   [provider]  haloperidol decanoate (HALDOL DECANOATE) 100 MG/ML injection Inject 100 mg into the muscle every 28 (twenty-eight) days. 03/19/21   [provider]  traZODone (DESYREL) 100 MG tablet Take 1 tablet (100 mg total) by mouth at bedtime. 05/17/16   Kerrie Buffalo, NP     Critical care time: 39 minutes  Chesley Mires, MD [place] Pager - [phone] or [phone] 03/11/2022, 10:46 PM

## 2022-03-21 NOTE — ED Provider Notes (Signed)
[facility] DEPT Provider Note   CSN: [id number] Arrival date & time: 03/03/2022  2005     History  Chief Complaint  Patient presents with   Shortness of Breath    Keith Murray is a 61 y.o. male with a history of PE, polysubstance use, pulmonary malignancy, presenting to ED with shortness of breath.  Patient ports worsening symptoms for "a few days".  He is rather contentious and cannot provide further history or would not provide further history to the staff.  He says he does feel short of breath.  Medical records show the patient has had several evaluation by psychiatry team for substance induced disorder, acute alcohol intoxication, and aggressive behavior and psychosis.  HPI     Home Medications Prior to Admission medications   Medication Sig Start Date End Date Taking? Authorizing Provider  benztropine (COGENTIN) 0.5 MG tablet Take 1 tablet (0.5 mg total) by mouth 2 (two) times daily. 05/17/16   Kerrie Buffalo, NP  FLUoxetine (PROZAC) 20 MG capsule Take 1 capsule (20 mg total) by mouth daily. 05/18/16   Kerrie Buffalo, NP  gabapentin (NEURONTIN) 300 MG capsule Take 300 mg by mouth in the morning and at bedtime. 01/03/20   [provider]  haloperidol (HALDOL) 10 MG tablet Take 10 mg by mouth 2 (two) times daily. 01/03/20   [provider]  haloperidol decanoate (HALDOL DECANOATE) 100 MG/ML injection Inject 100 mg into the muscle every 28 (twenty-eight) days. 03/19/21   [provider]  traZODone (DESYREL) 100 MG tablet Take 1 tablet (100 mg total) by mouth at bedtime. 05/17/16   Kerrie Buffalo, NP      Allergies    Patient has no known allergies.    Review of Systems   Review of Systems  Physical Exam Updated Vital Signs BP 119/88   Pulse (!) 115   Resp (!) 35   Ht 6\' 1"  (1.854 m)   Wt 68 kg   SpO2 99%   BMI 19.79 kg/m  Physical Exam Constitutional:      General: He is not in acute distress. HENT:      Head: Normocephalic and atraumatic.  Eyes:     Conjunctiva/sclera: Conjunctivae normal.     Pupils: Pupils are equal, round, and reactive to light.  Cardiovascular:     Rate and Rhythm: Regular rhythm. Tachycardia present.  Pulmonary:     Effort: Pulmonary effort is normal. No respiratory distress.     Comments: 68% on room air, RR 40 bpm, speaking in short sentences Abdominal:     General: There is no distension.     Tenderness: There is no abdominal tenderness.  Skin:    General: Skin is warm and dry.  Neurological:     General: No focal deficit present.     Mental Status: He is alert. Mental status is at baseline.     ED Results / Procedures / Treatments   Labs (all labs ordered are listed, but only abnormal results are displayed) Labs Reviewed  RESP PANEL BY RT-PCR (RSV, FLU A&B, COVID)  RVPGX2 - Abnormal; Notable for the following components:      Result Value   SARS Coronavirus 2 by RT PCR POSITIVE (*)    All other components within normal limits  BRAIN NATRIURETIC PEPTIDE - Abnormal; Notable for the following components:   B Natriuretic Peptide 297.0 (*)    All other components within normal limits  COMPREHENSIVE METABOLIC PANEL - Abnormal; Notable for the following components:  Sodium 127 (*)    Potassium 3.3 (*)    Chloride 90 (*)    Glucose, Bld 131 (*)    Calcium 8.7 (*)    Total Protein 8.3 (*)    Albumin 3.1 (*)    Alkaline Phosphatase 154 (*)    Total Bilirubin 1.4 (*)    All other components within normal limits  CBC WITH DIFFERENTIAL/PLATELET - Abnormal; Notable for the following components:   WBC 23.5 (*)    RBC 4.03 (*)    Hemoglobin 12.9 (*)    HCT 35.5 (*)    MCHC 36.3 (*)    Neutro Abs 21.2 (*)    Lymphs Abs 0.6 (*)    Monocytes Absolute 1.1 (*)    Abs Immature Granulocytes 0.46 (*)    All other components within normal limits  BLOOD GAS, VENOUS - Abnormal; Notable for the following components:   pO2, Ven <31 (*)    Acid-Base Excess 2.7 (*)     All other components within normal limits  I-STAT CHEM 8, ED - Abnormal; Notable for the following components:   Sodium 126 (*)    Potassium 3.4 (*)    Chloride 91 (*)    Glucose, Bld 137 (*)    Calcium, Ion 1.07 (*)    All other components within normal limits  TROPONIN I (HIGH SENSITIVITY) - Abnormal; Notable for the following components:   Troponin I (High Sensitivity) 31 (*)    All other components within normal limits  CULTURE, BLOOD (ROUTINE X 2)  CULTURE, BLOOD (ROUTINE X 2)  PROTIME-INR  APTT  ETHANOL  LACTIC ACID, PLASMA  URINALYSIS, ROUTINE W REFLEX MICROSCOPIC  LACTIC ACID, PLASMA  TROPONIN I (HIGH SENSITIVITY)    EKG EKG Interpretation  Date/Time:  Monday March 21 2022 20:43:11 EST Ventricular Rate:  134 PR Interval:  126 QRS Duration: 94 QT Interval:  309 QTC Calculation: 462 R Axis:   51 Text Interpretation: Sinus tachycardia Ventricular premature complex Aberrant conduction of SV complex(es) Probable left atrial enlargement Probable left ventricular hypertrophy Confirmed by Octaviano Glow [phone]) on 03/03/2022 9:03:14 PM  Radiology DG Chest Port 1 View  Result Date: 03/20/2022 CLINICAL DATA:  Dyspnea EXAM: PORTABLE CHEST 1 VIEW COMPARISON:  01/28/2022 FINDINGS: Multiple healed right rib fractures with resultant right apical chest wall deformity and associated pleural thickening again noted. Interval development of multifocal bilateral airspace infiltrates, more focal within the left lung base in keeping with multifocal pneumonic infiltrate in the appropriate clinical setting. No pneumothorax or pleural effusion. Cardiac size within normal limits. Pulmonary vascularity is normal. No acute bone abnormality. IMPRESSION: 1. Interval development of multifocal pneumonic infiltrate, more focal within the left lung base. Electronically Signed   By: Fidela Salisbury M.D.   On: 03/27/2022 20:44    Procedures .Critical Care  Performed by: Wyvonnia Dusky,  MD Authorized by: Wyvonnia Dusky, MD   Critical care provider statement:    Critical care time (minutes):  45   Critical care time was exclusive of:  Separately billable procedures and treating other patients   Critical care was necessary to treat or prevent imminent or life-threatening deterioration of the following conditions:  Respiratory failure   Critical care was time spent personally by me on the following activities:  Ordering and performing treatments and interventions, ordering and review of laboratory studies, ordering and review of radiographic studies, pulse oximetry, review of old charts, examination of patient and evaluation of patient's response to treatment  Medications Ordered in ED Medications  doxycycline (VIBRAMYCIN) 100 mg in sodium chloride 0.9 % 250 mL IVPB (has no administration in time range)  sodium chloride 0.9 % bolus 1,000 mL (has no administration in time range)  cefTRIAXone (ROCEPHIN) 1 g in sodium chloride 0.9 % 100 mL IVPB (1 g Intravenous New Bag/Given 03/25/2022 2107)  sodium chloride 0.9 % bolus 1,000 mL (1,000 mLs Intravenous New Bag/Given 03/10/2022 2111)    ED Course/ Medical Decision Making/ A&P Clinical Course as of 03/02/2022 2233  Mon Mar 21, 2022  2106 Multifocal PNA - antibiotics ordered.  Pt oxygen improved on bipap, still tachynpneic but WOB is improved, RR 40 [MT]  2116 pH, Ven: 7.41 [MT]  2116 pCO2, Ven: 44 [MT]  2116 Bicarbonate: 27.9 [MT]  2124 Critical care consulted, will see patient [MT]  2210 SARS Coronavirus 2 by RT PCR(!): POSITIVE [MT]  2210 Also covid positive [MT]    Clinical Course User Index [MT] Kela Baccari, Carola Rhine, MD                           Medical Decision Making Amount and/or Complexity of Data Reviewed Labs: ordered. Decision-making details documented in ED Course. ECG/medicine tests: ordered.  Risk Decision regarding hospitalization.   This patient presents to the ED with concern for weakness, shortness of  breath. This involves an extensive number of treatment options, and is a complaint that carries with it a high risk of complications and morbidity.  The differential diagnosis includes pneumonia versus anemia versus pneumothorax versus pleural effusion versus malignancy versus thrombosis versus other  Co-morbidities that complicate the patient evaluation: History of pulmonary malignancy, at high risk of recurrence, history of COPD and underlying pulmonary problems  +Covid testing  External records from outside source obtained and reviewed including psychiatric evaluations the past several months for psychosis, aggressive behavior  I ordered and personally interpreted labs.  The pertinent results include: Hyponatremia.  No acute significant anemia.  Venous gas with no significant acidosis, bicarb and CO2 levels within normal limits.  I ordered imaging studies including x-ray of the chest I independently visualized and interpreted imaging which showed bilateral infiltrates concerning for multifocal pneumonia I agree with the radiologist interpretation  The patient was maintained on a cardiac monitor.  I personally viewed and interpreted the cardiac monitored which showed an underlying rhythm of: Sinus tachycardia  Per my interpretation the patient's ECG shows sinus tachycardia with left bundle branch block pattern, chronic, without acute ischemic findings  I ordered medication including IV fluids per sepsis bolus per ideal body weight.  Rocephin and doxycycline for community pneumonia.  Normal saline fluid is also ordered for hyponatremia and hypochloremia.  I have reviewed the patients home medicines and have made adjustments as needed  Test Considered: Given the findings multifocal pneumonia is likely cause of his respiratory distress, have a lower clinical suspicion at this time for pulmonary embolism, and did not feel that he needs an emergent CT PE study at this time.  I requested  consultation with the critical care,  and discussed lab and imaging findings as well as pertinent plan - they recommend: medical admission  After the interventions noted above, I reevaluated the patient and found that they have: improved  The patient was started on BiPAP due to hypoxia, tachypnea, increased work of breathing and respiratory effort on arrival.  He is stabilized on BiPAP with improvement of his oxygenation, as well as his respiratory rate, which  still remains somewhat elevated.  However he is not requiring intubation at this time.   Dispostion:  After consideration of the diagnostic results and the patients response to treatment, I feel that the patent would benefit from medical admission.         Final Clinical Impression(s) / ED Diagnoses Final diagnoses:  Pneumonia of both lower lobes due to infectious organism  Hypoxia  Hyponatremia  COVID-19    Rx / DC Orders ED Discharge Orders     None         Wyvonnia Dusky, MD 02/26/2022 2233

## 2022-03-21 NOTE — ED Triage Notes (Signed)
Pt via POV c/o SOB x 4 days. Previous lung cancer pt. Notedly dyspneic at time of assessment with diffuse rhonchi bilaterally and JVD. Pulse ox 67% on room air, increased to low 90s on 5L.

## 2022-03-21 NOTE — ED Provider Notes (Signed)
Ultrasound ED Peripheral IV (Provider)  Date/Time: 02/25/2022 8:53 PM  Performed by: Margarita Mail, PA-C Authorized by: Margarita Mail, PA-C   Procedure details:    Indications: multiple failed IV attempts and poor IV access     Skin Prep: chlorhexidine gluconate     Location:  Right AC   Angiocath:  22 G   Bedside Ultrasound Guided: Yes     Images: not archived     Patient tolerated procedure without complications: Yes     Dressing applied: Yes       Margarita Mail, PA-C 03/17/2022 2053    Wyvonnia Dusky, MD 03/23/2022 2108

## 2022-03-21 NOTE — ED Notes (Signed)
MD at bedside. 

## 2022-03-22 ENCOUNTER — Inpatient Hospital Stay (HOSPITAL_COMMUNITY): Payer: Medicare Other

## 2022-03-22 DIAGNOSIS — I2699 Other pulmonary embolism without acute cor pulmonale: Secondary | ICD-10-CM

## 2022-03-22 DIAGNOSIS — U071 COVID-19: Secondary | ICD-10-CM | POA: Diagnosis not present

## 2022-03-22 DIAGNOSIS — J1282 Pneumonia due to coronavirus disease 2019: Secondary | ICD-10-CM | POA: Diagnosis not present

## 2022-03-22 LAB — COMPREHENSIVE METABOLIC PANEL
ALT: 19 U/L (ref 0–44)
AST: 24 U/L (ref 15–41)
Albumin: 2.6 g/dL — ABNORMAL LOW (ref 3.5–5.0)
Alkaline Phosphatase: 128 U/L — ABNORMAL HIGH (ref 38–126)
Anion gap: 8 (ref 5–15)
BUN: 19 mg/dL (ref 8–23)
CO2: 23 mmol/L (ref 22–32)
Calcium: 8.3 mg/dL — ABNORMAL LOW (ref 8.9–10.3)
Chloride: 101 mmol/L (ref 98–111)
Creatinine, Ser: 0.79 mg/dL (ref 0.61–1.24)
GFR, Estimated: >60 mL/min (ref >60)
Glucose, Bld: 130 mg/dL — ABNORMAL HIGH (ref 70–99)
Potassium: 3.7 mmol/L (ref 3.5–5.1)
Sodium: 132 mmol/L — ABNORMAL LOW (ref 135–145)
Total Bilirubin: 1.1 mg/dL (ref 0.3–1.2)
Total Protein: 6.8 g/dL (ref 6.5–8.1)

## 2022-03-22 LAB — CBC WITH DIFFERENTIAL/PLATELET
Abs Immature Granulocytes: 0.53 10*3/uL — ABNORMAL HIGH (ref 0.00–0.07)
Basophils Absolute: 0 10*3/uL (ref 0.0–0.1)
Basophils Relative: 0 %
Eosinophils Absolute: 0 10*3/uL (ref 0.0–0.5)
Eosinophils Relative: 0 %
HCT: 31.2 % — ABNORMAL LOW (ref 39.0–52.0)
Hemoglobin: 10.9 g/dL — ABNORMAL LOW (ref 13.0–17.0)
Immature Granulocytes: 3 %
Lymphocytes Relative: 3 %
Lymphs Abs: 0.6 10*3/uL — ABNORMAL LOW (ref 0.7–4.0)
MCH: 31.1 pg (ref 26.0–34.0)
MCHC: 34.9 g/dL (ref 30.0–36.0)
MCV: 89.1 fL (ref 80.0–100.0)
Monocytes Absolute: 1 10*3/uL (ref 0.1–1.0)
Monocytes Relative: 5 %
Neutro Abs: 17.1 10*3/uL — ABNORMAL HIGH (ref 1.7–7.7)
Neutrophils Relative %: 89 %
Platelets: 368 10*3/uL (ref 150–400)
RBC: 3.5 MIL/uL — ABNORMAL LOW (ref 4.22–5.81)
RDW: 14 % (ref 11.5–15.5)
WBC: 19.3 10*3/uL — ABNORMAL HIGH (ref 4.0–10.5)
nRBC: 0 % (ref 0.0–0.2)

## 2022-03-22 LAB — RAPID URINE DRUG SCREEN, HOSP PERFORMED
Amphetamines: NOT DETECTED
Barbiturates: NOT DETECTED
Benzodiazepines: NOT DETECTED
Cocaine: POSITIVE — AB
Opiates: NOT DETECTED
Tetrahydrocannabinol: POSITIVE — AB

## 2022-03-22 LAB — GLUCOSE, CAPILLARY
Glucose-Capillary: 137 mg/dL — ABNORMAL HIGH (ref 70–99)
Glucose-Capillary: 106 mg/dL — ABNORMAL HIGH (ref 70–99)
Glucose-Capillary: 101 mg/dL — ABNORMAL HIGH (ref 70–99)
Glucose-Capillary: 126 mg/dL — ABNORMAL HIGH (ref 70–99)
Glucose-Capillary: 120 mg/dL — ABNORMAL HIGH (ref 70–99)
Glucose-Capillary: 138 mg/dL — ABNORMAL HIGH (ref 70–99)

## 2022-03-22 LAB — STREP PNEUMONIAE URINARY ANTIGEN: Strep Pneumo Urinary Antigen: POSITIVE — AB

## 2022-03-22 LAB — BLOOD GAS, VENOUS
Acid-base deficit: 1 mmol/L (ref 0.0–2.0)
Bicarbonate: 26.8 mmol/L (ref 20.0–28.0)
O2 Saturation: 36.1 %
Patient temperature: 36.7
pCO2, Ven: 56 mmHg (ref 44–60)
pH, Ven: 7.28 (ref 7.25–7.43)
pO2, Ven: <31 mmHg — CL (ref 32–45)

## 2022-03-22 LAB — CBG MONITORING, ED: Glucose-Capillary: 120 mg/dL — ABNORMAL HIGH (ref 70–99)

## 2022-03-22 LAB — URINALYSIS, ROUTINE W REFLEX MICROSCOPIC
Bacteria, UA: NONE SEEN
Bilirubin Urine: NEGATIVE
Glucose, UA: NEGATIVE mg/dL
Ketones, ur: NEGATIVE mg/dL
Leukocytes,Ua: NEGATIVE
Nitrite: NEGATIVE
Protein, ur: 30 mg/dL — AB
Specific Gravity, Urine: 1.01 (ref 1.005–1.030)
pH: 6 (ref 5.0–8.0)

## 2022-03-22 LAB — PROCALCITONIN: Procalcitonin: 4.66 ng/mL

## 2022-03-22 LAB — C-REACTIVE PROTEIN: CRP: 24 mg/dL — ABNORMAL HIGH (ref <1.0)

## 2022-03-22 LAB — TROPONIN I (HIGH SENSITIVITY): Troponin I (High Sensitivity): 20 ng/L — ABNORMAL HIGH (ref <18)

## 2022-03-22 LAB — ABO/RH: ABO/RH(D): O POS

## 2022-03-22 LAB — BRAIN NATRIURETIC PEPTIDE: B Natriuretic Peptide: 219.6 pg/mL — ABNORMAL HIGH (ref 0.0–100.0)

## 2022-03-22 LAB — MAGNESIUM: Magnesium: 2.3 mg/dL (ref 1.7–2.4)

## 2022-03-22 LAB — D-DIMER, QUANTITATIVE: D-Dimer, Quant: 3.18 ug/mL-FEU — ABNORMAL HIGH (ref 0.00–0.50)

## 2022-03-22 LAB — FERRITIN: Ferritin: 585 ng/mL — ABNORMAL HIGH (ref 24–336)

## 2022-03-22 LAB — PHOSPHORUS: Phosphorus: 2.8 mg/dL (ref 2.5–4.6)

## 2022-03-22 LAB — HIV ANTIBODY (ROUTINE TESTING W REFLEX): HIV Screen 4th Generation wRfx: NONREACTIVE

## 2022-03-22 LAB — LACTIC ACID, PLASMA: Lactic Acid, Venous: 1.1 mmol/L (ref 0.5–1.9)

## 2022-03-22 MED ORDER — ORAL CARE MOUTH RINSE: 15.0000 mL | OROMUCOSAL | Status: DC | PRN

## 2022-03-22 MED ORDER — CHLORHEXIDINE GLUCONATE CLOTH 2 % EX PADS
6.0000 | MEDICATED_PAD | Freq: Every day | CUTANEOUS | Status: DC
  Administered 2022-03-22 – 2022-03-28 (×5): 6 via TOPICAL

## 2022-03-22 MED ORDER — ORAL CARE MOUTH RINSE
15.0000 mL | OROMUCOSAL | Status: DC
  Administered 2022-03-22 – 2022-03-29 (×26): 15 mL via OROMUCOSAL

## 2022-03-22 MED ORDER — ADULT MULTIVITAMIN W/MINERALS CH
1.0000 | ORAL_TABLET | Freq: Every day | ORAL | Status: DC
  Administered 2022-03-22 – 2022-03-24 (×3): 1 via ORAL
  Filled 2022-03-22 (×3): qty 1

## 2022-03-22 MED ORDER — METHYLPREDNISOLONE SODIUM SUCC 40 MG IJ SOLR
40.0000 mg | Freq: Every day | INTRAMUSCULAR | Status: DC
  Administered 2022-03-22 – 2022-03-23 (×2): 40 mg via INTRAVENOUS
  Filled 2022-03-22 (×2): qty 1

## 2022-03-22 MED ORDER — THIAMINE HCL 100 MG/ML IJ SOLN
100.0000 mg | Freq: Once | INTRAMUSCULAR | Status: AC
  Administered 2022-03-22: 100 mg via INTRAMUSCULAR
  Filled 2022-03-22: qty 2

## 2022-03-22 MED ORDER — CHLORDIAZEPOXIDE HCL 25 MG PO CAPS
25.0000 mg | ORAL_CAPSULE | Freq: Four times a day (QID) | ORAL | Status: AC
  Administered 2022-03-22 – 2022-03-23 (×4): 25 mg via ORAL
  Filled 2022-03-22 (×5): qty 1

## 2022-03-22 MED ORDER — DEXMEDETOMIDINE HCL IN NACL 200 MCG/50ML IV SOLN
0.4000 ug/kg/h | INTRAVENOUS | Status: DC
  Administered 2022-03-22: 0.9 ug/kg/h via INTRAVENOUS
  Administered 2022-03-22: 0.6 ug/kg/h via INTRAVENOUS
  Administered 2022-03-22: 0.4 ug/kg/h via INTRAVENOUS
  Administered 2022-03-23 (×2): 1.1 ug/kg/h via INTRAVENOUS
  Filled 2022-03-22 (×5): qty 50

## 2022-03-22 MED ORDER — LORAZEPAM 2 MG/ML IJ SOLN: 0.0000 mg | Freq: Three times a day (TID) | INTRAMUSCULAR | Status: DC

## 2022-03-22 MED ORDER — LORAZEPAM 2 MG/ML IJ SOLN
0.0000 mg | INTRAMUSCULAR | Status: DC
  Administered 2022-03-22 (×2): 2 mg via INTRAVENOUS
  Filled 2022-03-22 (×3): qty 1

## 2022-03-22 MED ORDER — INSULIN ASPART 100 UNIT/ML IJ SOLN
0.0000 [IU] | INTRAMUSCULAR | Status: DC
  Administered 2022-03-22 (×2): 1 [IU] via SUBCUTANEOUS
  Administered 2022-03-23: 2 [IU] via SUBCUTANEOUS
  Administered 2022-03-26: 1 [IU] via SUBCUTANEOUS

## 2022-03-22 MED ORDER — IPRATROPIUM-ALBUTEROL 0.5-2.5 (3) MG/3ML IN SOLN: 3.0000 mL | RESPIRATORY_TRACT | Status: DC | PRN

## 2022-03-22 MED ORDER — CHLORDIAZEPOXIDE HCL 25 MG PO CAPS: 25.0000 mg | ORAL_CAPSULE | Freq: Every day | ORAL | Status: DC

## 2022-03-22 MED ORDER — LORAZEPAM 2 MG/ML IJ SOLN: 1.0000 mg | INTRAMUSCULAR | Status: DC | PRN

## 2022-03-22 MED ORDER — LORAZEPAM 1 MG PO TABS: 1.0000 mg | ORAL_TABLET | ORAL | Status: DC | PRN

## 2022-03-22 MED ORDER — CHLORDIAZEPOXIDE HCL 25 MG PO CAPS
25.0000 mg | ORAL_CAPSULE | Freq: Three times a day (TID) | ORAL | Status: AC
  Administered 2022-03-23 – 2022-03-24 (×3): 25 mg via ORAL
  Filled 2022-03-22 (×3): qty 1

## 2022-03-22 MED ORDER — SODIUM CHLORIDE 0.9 % IV SOLN
2.0000 g | INTRAVENOUS | Status: AC
  Administered 2022-03-22 – 2022-03-27 (×6): 2 g via INTRAVENOUS
  Filled 2022-03-22 (×6): qty 20

## 2022-03-22 MED ORDER — CHLORDIAZEPOXIDE HCL 25 MG PO CAPS
25.0000 mg | ORAL_CAPSULE | ORAL | Status: DC
  Administered 2022-03-24: 25 mg via ORAL
  Filled 2022-03-22: qty 1

## 2022-03-22 NOTE — ED Notes (Signed)
RN attempted to draw labs from pt. Pt stated "well if you dont know what is going on with me You better find somebody smarter than you to tell you"  Rn will attempt labs again later.

## 2022-03-22 NOTE — Progress Notes (Signed)
  Transition of Care (TOC) Screening Note   Patient Details  Name: Makoa Satz Date of Birth: Apr 15, 1960   Transition of [facility]) CM/SW Contact:    Roseanne Kaufman, RN Phone Number: 03/22/2022, 6:49 PM    Transition of Care Department [facility]) has reviewed patient and no TOC needs have been identified at this time. We will continue to monitor patient advancement through interdisciplinary progression rounds. If new patient transition needs arise, please place a TOC consult.

## 2022-03-22 NOTE — Progress Notes (Addendum)
NAME:  Keith Murray, MRN:  [id number], DOB:  10-08-1960, LOS: 1 ADMISSION DATE:  03/14/2022, CONSULTATION DATE:  02/26/2022 REFERRING MD:  Dr. Langston Masker, ER, CHIEF COMPLAINT:  SOB   History of Present Illness:  61 yo male smoker presented to [facility] ER with 4 days of dyspnea and cough.  Found to have SpO2 67% on RA.  CXR showed multifocal pneumonia and tested positive for COVID 19.  Started on Bipap in ER.  PCCM asked to assess for ICU admission.  Pertinent  Medical History  Anxiety, Lung cancer, Cocaine abuse, ETOH, Schizoaffective disorder  Significant Hospital Events: Including procedures, antibiotic start and stop dates in addition to other pertinent events   12/25 Admit, start Bipap, start remdesivir/tocilizumab/decadron  Interim History / Subjective:  Not been cooperative with staff or basic care at times.  Arrived on unit shortly before 7am, remains on BiPAP and CIWA   Objective   Blood pressure (!) 139/90, pulse 87, temperature 97.8 F (36.6 C), temperature source Axillary, resp. rate (!) 29, height _0  (1.854 m), weight 60.8 kg, SpO2 100 %.    FiO2 (%):  [50 %] 50 %   Intake/Output Summary (Last 24 hours) at 03/22/2022 4034 Last data filed at 03/22/2022 0600 Gross per 24 hour  Intake 277.23 ml  Output --  Net 277.23 ml   Filed Weights   03/16/2022 2026 03/22/22 0644  Weight: 68 kg 60.8 kg    Examination: S/p ativan General: Thin older appearing male sitting upright in bed in NAD on BiPAP HEENT:  full face mask, pupils 3/reactive, poor dentition Neuro: sedated, but will moan and move around, MAE CV: rr, NSR PULM:  non labored on BiPAP 16/8, 50>40%, TV 350-700, mostly averaging 500's, bibasilar rales L> R, no wheeze GI: soft, bs+, ND, NT, male purwick Extremities: warm/dry, no LE edema  Skin: no rashes, appears to have multiple puncture marks to both arms, L>R  Labs Na 127> 132, K 3.7, sCr 0.79, Mag 2.3, phos 2.8, alk phos 154> 128, BNP 297> 219, trop hs 31> 20,  lactic 1.1, WBC 23.5> 19.3, Hgb 10.9, d-dimer 3.18, CRP 24 PCT 4.66 UA small Hgb, protein 30, no RBCs, neg for nitrates/ leukocytes  CBG range 101-120  Resolved Hospital Problem list     Assessment & Plan:   Acute hypoxic respiratory failure 2nd to COVID 19 pneumonia. - cont BiPAP for now, - remain high intubation risk - repeat VBG given mental status to rule out hypercarbia vs just sedation with recent  - wean O2 for goal sats > 90-99% - NPO except for meds if mental status is appropriate  - cont BDs - CXR intermittently  - cont ceftriaxone and doxycycline for 5 days for empiric CAP given elevated PCT.  Trend PCT - follow blood cultures  - trend WBC/ fever curve  - cont solumedrol 62m daily - cont remdesivir and tocilizumab  - strep urine pending  - ongoing aggressive pulmonary hygiene  - given prior hx of PE, elevated trop hs and BNP and elevated d-dimer ( which can be related to COVID), and hypoxia (also be related to COVID but currently on 40%) will discuss with attending about CTA PE to rule out acute PE.  Check LE dopplers and echo.  Acute encephalopathy, likely toxic/ metabolic Hx of schizoaffective disorder Hx of polysubstance abuse- ETOH, cocaine - continue cogentin, prozac, neurontin, haldol, trazodone as able - monitor for withdrawals, continue CIWA protocol with ativan.  Precedex if needed.   - delirium precautions  -  add on UDS  Hx of lung cancer with continued tobacco abuse. - followed by Dr. Julien Nordmann with oncology - smoking cessation when appropriate   Hyponatremia. - improving, will cont NS IV fluid at 75 ml/hr while NPO - trend BMET  Steroid induced hyperglycemia. - SSI prn   Best Practice (right click and "Reselect all SmartList Selections" daily)   Diet/type: NPO w/ oral meds DVT prophylaxis: LMWH GI prophylaxis: PPI Lines: N/A Foley:  N/A Code Status:  full code Last date of multidisciplinary goals of care discussion _0   Pending.  No family  at bedside.   Labs   CBC: Recent Labs  Lab 03/14/2022 2057 03/12/2022 2058 03/22/22 0721  WBC 23.5*  --  19.3*  NEUTROABS 21.2*  --  17.1*  HGB 12.9* 14.3 10.9*  HCT 35.5* 42.0 31.2*  MCV 88.1  --  89.1  PLT 398  --  382    Basic Metabolic Panel: Recent Labs  Lab 03/06/2022 2057 03/06/2022 2058 03/22/22 0721  NA 127* 126* 132*  K 3.3* 3.4* 3.7  CL 90* 91* 101  CO2 24  --  23  GLUCOSE 131* 137* 130*  BUN _1 CREATININE 1.13 1.10 0.79  CALCIUM 8.7*  --  8.3*  MG  --   --  2.3  PHOS  --   --  2.8   GFR: Estimated Creatinine Clearance: 83.4 mL/min (by C-G formula based on SCr of 0.79 mg/dL). Recent Labs  Lab 03/18/2022 2057 03/12/2022 2115 03/22/22 0721  WBC 23.5*  --  19.3*  LATICACIDVEN  --  2.1* 1.1    Liver Function Tests: Recent Labs  Lab 03/26/2022 2057 03/22/22 0721  AST 25 24  ALT 20 19  ALKPHOS 154* 128*  BILITOT 1.4* 1.1  PROT 8.3* 6.8  ALBUMIN 3.1* 2.6*   No results for input(s): "LIPASE", "AMYLASE" in the last 168 hours. No results for input(s): "AMMONIA" in the last 168 hours.  ABG    Component Value Date/Time   HCO3 27.9 03/14/2022 2051   TCO2 25 02/25/2022 2058   O2SAT 47.1 03/05/2022 2051     Coagulation Profile: Recent Labs  Lab 03/12/2022 2057  INR 1.1    Cardiac Enzymes: No results for input(s): "CKTOTAL", "CKMB", "CKMBINDEX", "TROPONINI" in the last 168 hours.  HbA1C: Hgb A1c MFr Bld  Date/Time Value Ref Range Status  05/17/2016 06:19 AM 5.1 4.8 - 5.6 % Final    Comment:    (NOTE)         Pre-diabetes: 5.7 - 6.4         Diabetes: >6.4         Glycemic control for adults with diabetes: <7.0     CBG: Recent Labs  Lab 03/22/22 0132 03/22/22 0652  GLUCAP 120* 137*    Critical care time: 40 minutes      Amanda Cockayne [facility] 03/22/2022, 8:32 AM  See Amion for pager If no response to pager, please call PCCM consult pager After 7:00 pm call Elink

## 2022-03-22 NOTE — ED Notes (Signed)
Pt refused labs wile RN was in the room

## 2022-03-22 NOTE — Progress Notes (Signed)
Pt seen, found off bipap, on 15L hfnc /salter, HR63, rr26, spo2 96% .  No increased wob / respiratory distress noted or voiced by patient at this time.  Pt stated he does not want to wear the bipap mask when offered.  Bipap remains in room on standby as needed.

## 2022-03-22 NOTE — Progress Notes (Signed)
Transported on BiPAP to ICU/SD without event.

## 2022-03-22 NOTE — ED Notes (Signed)
Pt educated to keep his arm strait in order to allow medications to go in to IV pt stated "I know " then bent his arm to make pump alarm. Rn educated that the beeping will stop when he keeps his arm strait pt states "I want it to beep."

## 2022-03-22 NOTE — Progress Notes (Signed)
  Patient reporting sharp left sided chest pain, radiates to his back and now also tachycardic as high in the 140's and tachypneic 30-40's on BiPAP. SBP 140-150's.  Intermittently will settle back to NSR and normal RR.  O2 sats hard to pick up on his finger, moving probe.    On bedside, patient alert, tracks, and following commands.  Limited verbal 2/2 BiPAP mask but volumes still appear good.  Bilateral lung sounds present, scattered rales/ rhonchi.  Seems anxious. Asking for coffee.    By bedside US, bilateral lung sliding present.  Only see B-lines, no obvious pleural effusion.   Suspect more anxiety/ ?withdrawal but will need to r/o cardiac event, PTX  P: - stat EKG> NSR, no acute STE - stat CXR - echo has been ordered  - if tolerated, can try off BiPAP pending CXR, as he may be more anxious now that he is more awake on BiPAP - starting precedex   - cont to closely monitor   Additional CCT 20 mins  Kennieth Rad, ANCP [facility] 03/22/2022, 3:11 PM  See Amion for pager If no response to pager, please call PCCM consult pager After 7:00 pm call Elink

## 2022-03-22 NOTE — ED Notes (Signed)
Pt refused to take temp

## 2022-03-22 NOTE — ED Notes (Addendum)
Pt only received half of his Actmra. And RN was unable to restart Remdezovir.

## 2022-03-22 NOTE — Progress Notes (Signed)
Bilateral lower extremity venous duplex has been completed. Preliminary results can be found in CV Proc through chart review.   03/22/22 2:57 PM Carlos Levering RVT

## 2022-03-22 NOTE — Progress Notes (Signed)
eLink Physician-Brief Progress Note Patient Name: Keith Murray DOB: 06/09/1960 MRN: [id number]   Date of Service  03/22/2022  HPI/Events of Note  Respiratory failure from Covid. Seen on camera. On 16/8/50%. O2 sat 97. No distress. Awake and alert. Refusing several interventions.   eICU Interventions  Continue plan per ccm Call E link if needed     Intervention Category Major Interventions: Respiratory failure - evaluation and management  [name] 03/22/2022, 6:12 AM

## 2022-03-22 NOTE — ED Notes (Signed)
Tried to get vitals but refused.

## 2022-03-22 NOTE — Progress Notes (Signed)
Pt seen during rounds, remains off bipap.  HR55, RR22, spo2 99% on 15L salter /hfnc.  No increased wob or respiratory distress noted or voiced by patient.  RN in room and in agreement with assessment.  Bipap remains in room on standby but not indicated at this time.

## 2022-03-22 NOTE — ED Notes (Signed)
IV on Right side is not flushing pt refuses to allow rn to remove IV

## 2022-03-22 NOTE — Progress Notes (Signed)
Pt had informed staff initially that he wouldn't want intubation.  When I explained to him that if his respiratory status got worse from COVID pneumonia and we didn't have the option to intubate, then he could potentially die.  After understanding this he would be agreeable to intubation if needed.    Chesley Mires, MD [place] [place] Pager - [phone] 03/22/2022, 2:03 AM

## 2022-03-23 ENCOUNTER — Inpatient Hospital Stay (HOSPITAL_COMMUNITY): Payer: Medicare Other

## 2022-03-23 DIAGNOSIS — U071 COVID-19: Secondary | ICD-10-CM | POA: Diagnosis not present

## 2022-03-23 DIAGNOSIS — J1282 Pneumonia due to coronavirus disease 2019: Secondary | ICD-10-CM | POA: Diagnosis not present

## 2022-03-23 DIAGNOSIS — R079 Chest pain, unspecified: Secondary | ICD-10-CM

## 2022-03-23 DIAGNOSIS — R7989 Other specified abnormal findings of blood chemistry: Secondary | ICD-10-CM

## 2022-03-23 LAB — MAGNESIUM: Magnesium: 2.2 mg/dL (ref 1.7–2.4)

## 2022-03-23 LAB — MRSA NEXT GEN BY PCR, NASAL: MRSA by PCR Next Gen: NOT DETECTED

## 2022-03-23 LAB — CBC
HCT: 31.2 % — ABNORMAL LOW (ref 39.0–52.0)
Hemoglobin: 10.6 g/dL — ABNORMAL LOW (ref 13.0–17.0)
MCH: 31.4 pg (ref 26.0–34.0)
MCHC: 34 g/dL (ref 30.0–36.0)
MCV: 92.3 fL (ref 80.0–100.0)
Platelets: 339 10*3/uL (ref 150–400)
RBC: 3.38 MIL/uL — ABNORMAL LOW (ref 4.22–5.81)
RDW: 14.5 % (ref 11.5–15.5)
WBC: 12.8 10*3/uL — ABNORMAL HIGH (ref 4.0–10.5)
nRBC: 0 % (ref 0.0–0.2)

## 2022-03-23 LAB — COMPREHENSIVE METABOLIC PANEL
ALT: 21 U/L (ref 0–44)
AST: 23 U/L (ref 15–41)
Albumin: 2.4 g/dL — ABNORMAL LOW (ref 3.5–5.0)
Alkaline Phosphatase: 132 U/L — ABNORMAL HIGH (ref 38–126)
Anion gap: 7 (ref 5–15)
BUN: 26 mg/dL — ABNORMAL HIGH (ref 8–23)
CO2: 21 mmol/L — ABNORMAL LOW (ref 22–32)
Calcium: 8.4 mg/dL — ABNORMAL LOW (ref 8.9–10.3)
Chloride: 106 mmol/L (ref 98–111)
Creatinine, Ser: 0.88 mg/dL (ref 0.61–1.24)
GFR, Estimated: >60 mL/min (ref >60)
Glucose, Bld: 121 mg/dL — ABNORMAL HIGH (ref 70–99)
Potassium: 4.3 mmol/L (ref 3.5–5.1)
Sodium: 134 mmol/L — ABNORMAL LOW (ref 135–145)
Total Bilirubin: 0.6 mg/dL (ref 0.3–1.2)
Total Protein: 6.2 g/dL — ABNORMAL LOW (ref 6.5–8.1)

## 2022-03-23 LAB — ECHOCARDIOGRAM COMPLETE
Area-P 1/2: 3.56 cm2
Calc EF: 44.8 %
Height: 73 in
S' Lateral: 4.7 cm
Single Plane A2C EF: 40.9 %
Single Plane A4C EF: 46.1 %
Weight: 2144.63 oz

## 2022-03-23 LAB — GLUCOSE, CAPILLARY
Glucose-Capillary: 117 mg/dL — ABNORMAL HIGH (ref 70–99)
Glucose-Capillary: 113 mg/dL — ABNORMAL HIGH (ref 70–99)
Glucose-Capillary: 116 mg/dL — ABNORMAL HIGH (ref 70–99)
Glucose-Capillary: 156 mg/dL — ABNORMAL HIGH (ref 70–99)
Glucose-Capillary: 111 mg/dL — ABNORMAL HIGH (ref 70–99)

## 2022-03-23 LAB — HEMOGLOBIN A1C
Hgb A1c MFr Bld: 5.7 % — ABNORMAL HIGH (ref 4.8–5.6)
Mean Plasma Glucose: 117 mg/dL

## 2022-03-23 LAB — PROCALCITONIN: Procalcitonin: 3.8 ng/mL

## 2022-03-23 MED ORDER — LORAZEPAM 2 MG/ML IJ SOLN
1.0000 mg | Freq: Once | INTRAMUSCULAR | Status: AC | PRN
  Administered 2022-03-23: 2 mg via INTRAVENOUS
  Filled 2022-03-23: qty 1

## 2022-03-23 MED ORDER — LORAZEPAM 2 MG/ML IJ SOLN
0.5000 mg | Freq: Once | INTRAMUSCULAR | Status: DC | PRN
  Filled 2022-03-23: qty 1

## 2022-03-23 MED ORDER — LORAZEPAM 2 MG/ML IJ SOLN
1.0000 mg | INTRAMUSCULAR | Status: DC | PRN
  Administered 2022-03-23: 1 mg via INTRAVENOUS
  Administered 2022-03-24 – 2022-03-26 (×8): 2 mg via INTRAVENOUS
  Filled 2022-03-23 (×9): qty 1

## 2022-03-23 MED ORDER — TAMSULOSIN HCL 0.4 MG PO CAPS
0.4000 mg | ORAL_CAPSULE | Freq: Every day | ORAL | Status: DC
  Administered 2022-03-23 – 2022-03-24 (×2): 0.4 mg via ORAL
  Filled 2022-03-23 (×2): qty 1

## 2022-03-23 MED ORDER — HALOPERIDOL 5 MG PO TABS
10.0000 mg | ORAL_TABLET | Freq: Three times a day (TID) | ORAL | Status: DC
  Filled 2022-03-23: qty 2

## 2022-03-23 MED ORDER — HALOPERIDOL 5 MG PO TABS
10.0000 mg | ORAL_TABLET | Freq: Two times a day (BID) | ORAL | Status: DC
  Administered 2022-03-23 – 2022-03-24 (×3): 10 mg via ORAL
  Filled 2022-03-23: qty 2
  Filled 2022-03-23: qty 10
  Filled 2022-03-23 (×2): qty 2

## 2022-03-23 NOTE — Progress Notes (Signed)
  Echocardiogram 2D Echocardiogram has been performed.  Keith Murray 03/23/2022, 3:11 PM

## 2022-03-23 NOTE — Progress Notes (Signed)
PCCM:  I spoke with patients Niece via phone   Garner Nash, DO [facility] 03/23/2022 12:29 PM

## 2022-03-23 NOTE — Progress Notes (Signed)
[facility] Progress Note Patient Name: Keith Murray DOB: Jun 01, 1960 MRN: [id number]   Date of Service  03/23/2022  HPI/Events of Note  Patient with agitated delirium, trying to climb out of bed, fall risk. He is on 1.2 mcg of Precedex.  eICU Interventions  Posey belt ordered. Ativan 0.5 mg iv x 1, minimize stimulation.        Kerry Kass Patrese Neal 03/23/2022, 12:22 AM

## 2022-03-23 NOTE — Progress Notes (Signed)
2D echo attempted, patient care in room. Will try later

## 2022-03-23 NOTE — Progress Notes (Addendum)
NAME:  Keith Murray, MRN:  [id number], DOB:  [birth year], LOS: 2 ADMISSION DATE:  03/18/2022, CONSULTATION DATE:  03/02/2022 REFERRING MD:  Dr. Langston Masker, ER, CHIEF COMPLAINT:  SOB   History of Present Illness:  61 yo male smoker presented to [facility] ER with 4 days of dyspnea and cough.  Found to have SpO2 67% on RA.  CXR showed multifocal pneumonia and tested positive for COVID 19.  Started on Bipap in ER.  PCCM asked to assess for ICU admission.  Pertinent  Medical History  Anxiety, Lung cancer, Cocaine abuse, ETOH, Schizoaffective disorder  Significant Hospital Events: Including procedures, antibiotic start and stop dates in addition to other pertinent events   12/25 Admit, start Bipap, start remdesivir/tocilizumab/decadron  Interim History / Subjective:  Not been cooperative with staff or basic care at times.  Arrived on unit shortly before 7am, remains on BiPAP and CIWA   Objective   Blood pressure 99/81, pulse (!) 48, temperature 97.9 F (36.6 C), temperature source Axillary, resp. rate (!) 26, height 6\' 1"  (1.854 m), weight 60.8 kg, SpO2 97 %.    FiO2 (%):  [40 %] 40 %   Intake/Output Summary (Last 24 hours) at 03/23/2022 1022 Last data filed at 03/23/2022 0865 Gross per 24 hour  Intake 2401.47 ml  Output 1200 ml  Net 1201.47 ml   Filed Weights   03/20/2022 2026 03/22/22 0644  Weight: 68 kg 60.8 kg    Examination:  General: Thin chronically ill-appearing gentleman HEENT: Poor dentition nasal cannula Neuro: Alert following commands CV: Regular rate rhythm S1-S2 PULM: Rhonchi bilaterally GI: Soft nontender nondistended Extremities: No significant edema Skin: No rash  Resolved Hospital Problem list     Assessment & Plan:   Acute hypoxic respiratory failure 2nd to COVID 19 pneumonia. Plan: Off BiPAP Wean nasal cannula O2 supplementation to maintain sats above 90% Continue bronchodilators Advance diet Complete ceftriaxone for 7 days coverage, strep urine antigen  positive Continue Solu-Medrol 40 mg daily, Lower extremity Dopplers negative  Acute encephalopathy, likely toxic/ metabolic Hx of schizoaffective disorder Hx of polysubstance abuse- ETOH, cocaine - continue cogentin, prozac, neurontin, haldol, trazodone as able -Librium taper As needed Ativan for CIWA Delirium precautions  Hx of lung cancer with continued tobacco abuse. - followed by Dr. Julien Nordmann with oncology - smoking cessation when appropriate   Hyponatremia. - advancing diet - Follow BMET  Steroid induced hyperglycemia. - SSI prn   Best Practice (right click and "Reselect all SmartList Selections" daily)   Diet/type: NPO w/ oral meds DVT prophylaxis: LMWH GI prophylaxis: PPI Lines: N/A Foley:  N/A Code Status:  full code Last date of multidisciplinary goals of care discussion [x]   Labs   CBC: Recent Labs  Lab 03/26/2022 2057 03/20/2022 2058 03/22/22 0721 03/23/22 0328  WBC 23.5*  --  19.3* 12.8*  NEUTROABS 21.2*  --  17.1*  --   HGB 12.9* 14.3 10.9* 10.6*  HCT 35.5* 42.0 31.2* 31.2*  MCV 88.1  --  89.1 92.3  PLT 398  --  368 784    Basic Metabolic Panel: Recent Labs  Lab 03/13/2022 2057 03/23/2022 2058 03/22/22 0721 03/23/22 0328  NA 127* 126* 132* 134*  K 3.3* 3.4* 3.7 4.3  CL 90* 91* 101 106  CO2 24  --  23 21*  GLUCOSE 131* 137* 130* 121*  BUN 23 22 19  26*  CREATININE 1.13 1.10 0.79 0.88  CALCIUM 8.7*  --  8.3* 8.4*  MG  --   --  2.3  2.2  PHOS  --   --  2.8  --    GFR: Estimated Creatinine Clearance: 75.8 mL/min (by C-G formula based on SCr of 0.88 mg/dL). Recent Labs  Lab 03/22/2022 2057 03/22/2022 2115 03/22/22 0721 03/23/22 0328  PROCALCITON  --   --  4.66 3.80  WBC 23.5*  --  19.3* 12.8*  LATICACIDVEN  --  2.1* 1.1  --     Liver Function Tests: Recent Labs  Lab 03/03/2022 2057 03/22/22 0721 03/23/22 0328  AST 25 24 23   ALT 20 19 21   ALKPHOS 154* 128* 132*  BILITOT 1.4* 1.1 0.6  PROT 8.3* 6.8 6.2*  ALBUMIN 3.1* 2.6* 2.4*   No  results for input(s): "LIPASE", "AMYLASE" in the last 168 hours. No results for input(s): "AMMONIA" in the last 168 hours.  ABG    Component Value Date/Time   HCO3 26.8 03/22/2022 0948   TCO2 25 03/16/2022 2058   ACIDBASEDEF 1.0 03/22/2022 0948   O2SAT 36.1 03/22/2022 0948     Coagulation Profile: Recent Labs  Lab 03/02/2022 2057  INR 1.1    Cardiac Enzymes: No results for input(s): "CKTOTAL", "CKMB", "CKMBINDEX", "TROPONINI" in the last 168 hours.  HbA1C: Hgb A1c MFr Bld  Date/Time Value Ref Range Status  03/22/2022 07:21 AM 5.7 (H) 4.8 - 5.6 % Final    Comment:    (NOTE)         Prediabetes: 5.7 - 6.4         Diabetes: >6.4         Glycemic control for adults with diabetes: <7.0   05/17/2016 06:19 AM 5.1 4.8 - 5.6 % Final    Comment:    (NOTE)         Pre-diabetes: 5.7 - 6.4         Diabetes: >6.4         Glycemic control for adults with diabetes: <7.0     CBG: Recent Labs  Lab 03/22/22 1614 03/22/22 1934 03/22/22 2327 03/23/22 0327 03/23/22 0837  GLUCAP 126* 120* 138* 117* 113*    This patient is critically ill with multiple organ system failure; which, requires frequent high complexity decision making, assessment, support, evaluation, and titration of therapies. This was completed through the application of advanced monitoring technologies and extensive interpretation of multiple databases. During this encounter critical care time was devoted to patient care services described in this note for 32 minutes.  Garner Nash, DO [facility] 03/23/2022 10:22 AM

## 2022-03-23 NOTE — Progress Notes (Signed)
Pt asleep, appears comfortable, HR98, RR19, spo2 99% on 10L [place] / hfnc.  Bipap in room on standby but not indicated at this time.

## 2022-03-24 ENCOUNTER — Inpatient Hospital Stay (HOSPITAL_COMMUNITY): Payer: Medicare Other

## 2022-03-24 DIAGNOSIS — E871 Hypo-osmolality and hyponatremia: Secondary | ICD-10-CM | POA: Diagnosis not present

## 2022-03-24 DIAGNOSIS — M7989 Other specified soft tissue disorders: Secondary | ICD-10-CM | POA: Diagnosis not present

## 2022-03-24 DIAGNOSIS — J1282 Pneumonia due to coronavirus disease 2019: Secondary | ICD-10-CM | POA: Diagnosis not present

## 2022-03-24 DIAGNOSIS — J189 Pneumonia, unspecified organism: Secondary | ICD-10-CM

## 2022-03-24 DIAGNOSIS — U071 COVID-19: Secondary | ICD-10-CM | POA: Diagnosis not present

## 2022-03-24 LAB — GLUCOSE, CAPILLARY
Glucose-Capillary: 103 mg/dL — ABNORMAL HIGH (ref 70–99)
Glucose-Capillary: 82 mg/dL (ref 70–99)
Glucose-Capillary: 83 mg/dL (ref 70–99)
Glucose-Capillary: 84 mg/dL (ref 70–99)
Glucose-Capillary: 89 mg/dL (ref 70–99)
Glucose-Capillary: 89 mg/dL (ref 70–99)

## 2022-03-24 LAB — CBC
HCT: 36.8 % — ABNORMAL LOW (ref 39.0–52.0)
Hemoglobin: 12.2 g/dL — ABNORMAL LOW (ref 13.0–17.0)
MCH: 31 pg (ref 26.0–34.0)
MCHC: 33.2 g/dL (ref 30.0–36.0)
MCV: 93.4 fL (ref 80.0–100.0)
Platelets: 410 10*3/uL — ABNORMAL HIGH (ref 150–400)
RBC: 3.94 MIL/uL — ABNORMAL LOW (ref 4.22–5.81)
RDW: 15.2 % (ref 11.5–15.5)
WBC: 9.3 10*3/uL (ref 4.0–10.5)
nRBC: 0 % (ref 0.0–0.2)

## 2022-03-24 LAB — PROCALCITONIN: Procalcitonin: 1.99 ng/mL

## 2022-03-24 MED ORDER — MIDAZOLAM HCL 2 MG/2ML IJ SOLN
INTRAMUSCULAR | Status: AC
  Filled 2022-03-24: qty 2

## 2022-03-24 MED ORDER — FENTANYL CITRATE PF 50 MCG/ML IJ SOSY
PREFILLED_SYRINGE | INTRAMUSCULAR | Status: AC
  Filled 2022-03-24: qty 2

## 2022-03-24 MED ORDER — LABETALOL HCL 5 MG/ML IV SOLN
INTRAVENOUS | Status: AC
  Administered 2022-03-24: 20 mg via INTRAVENOUS
  Filled 2022-03-24: qty 4

## 2022-03-24 MED ORDER — KETAMINE HCL 50 MG/5ML IJ SOSY
PREFILLED_SYRINGE | INTRAMUSCULAR | Status: AC
  Filled 2022-03-24: qty 10

## 2022-03-24 MED ORDER — METHYLPREDNISOLONE SODIUM SUCC 40 MG IJ SOLR
20.0000 mg | Freq: Every day | INTRAMUSCULAR | Status: DC
  Administered 2022-03-24 – 2022-03-28 (×5): 20 mg via INTRAVENOUS
  Filled 2022-03-24 (×6): qty 1

## 2022-03-24 MED ORDER — VALPROIC ACID 250 MG PO CAPS
250.0000 mg | ORAL_CAPSULE | Freq: Two times a day (BID) | ORAL | Status: DC
  Administered 2022-03-24 (×2): 250 mg via ORAL
  Filled 2022-03-24 (×3): qty 1

## 2022-03-24 MED ORDER — ROCURONIUM BROMIDE 10 MG/ML (PF) SYRINGE
PREFILLED_SYRINGE | INTRAVENOUS | Status: AC
  Filled 2022-03-24: qty 10

## 2022-03-24 MED ORDER — DEXMEDETOMIDINE HCL IN NACL 200 MCG/50ML IV SOLN
0.0000 ug/kg/h | INTRAVENOUS | Status: DC
  Administered 2022-03-24 (×2): 0.4 ug/kg/h via INTRAVENOUS
  Filled 2022-03-24 (×2): qty 50

## 2022-03-24 MED ORDER — PHENYLEPHRINE 80 MCG/ML (10ML) SYRINGE FOR IV PUSH (FOR BLOOD PRESSURE SUPPORT)
PREFILLED_SYRINGE | INTRAVENOUS | Status: AC
  Filled 2022-03-24: qty 10

## 2022-03-24 MED ORDER — POLYETHYLENE GLYCOL 3350 17 G PO PACK
17.0000 g | PACK | Freq: Every day | ORAL | Status: DC
  Administered 2022-03-24 – 2022-03-28 (×5): 17 g
  Filled 2022-03-24 (×5): qty 1

## 2022-03-24 MED ORDER — ETOMIDATE 2 MG/ML IV SOLN
INTRAVENOUS | Status: AC
  Filled 2022-03-24: qty 20

## 2022-03-24 MED ORDER — LABETALOL HCL 5 MG/ML IV SOLN: 10.0000 mg | INTRAVENOUS | Status: AC | PRN

## 2022-03-24 MED ORDER — FENTANYL CITRATE (PF) 100 MCG/2ML IJ SOLN
INTRAMUSCULAR | Status: AC
  Filled 2022-03-24: qty 2

## 2022-03-24 MED ORDER — SUCCINYLCHOLINE CHLORIDE 200 MG/10ML IV SOSY
PREFILLED_SYRINGE | INTRAVENOUS | Status: AC
  Filled 2022-03-24: qty 10

## 2022-03-24 MED ORDER — VITAL HIGH PROTEIN PO LIQD
1000.0000 mL | ORAL | Status: DC
  Administered 2022-03-24 – 2022-03-27 (×4): 1000 mL

## 2022-03-24 NOTE — Progress Notes (Signed)
PT niece [name] contacted by ground team physician due to change in her uncles medical condition. Patients niece is a NP and understood that if patient remained full code he would more than likely need to be intubated. Due to previous diagnosis of lung cancer it was explained to her that if ventilated, he would more than likely not come off of mechanical ventilation. POA expressed desire for her uncle to not suffer and agreed to make him a full DNR at this time. POA wants her uncle to continue receiving all current treatments for his illness with end goal of comfort care measures if patient is unable to recover from this illness. I [place] this conversation myself and notified the RN in charge of his care tonight.

## 2022-03-24 NOTE — Progress Notes (Signed)
[place] Progress Note Patient Name: Keith Murray DOB: [birth year] MRN: [id number]   Date of Service  03/24/2022  HPI/Events of Note  Patient with agitated delirium (CIWA score 15), tachycardia (HR 150's), and hypertension (191/90, MAP 121).  eICU Interventions  PRN Labetalol ordered for hypertension and tachycardia, Precedex gtt re-ordered.        Keith Murray 03/24/2022, 3:18 AM

## 2022-03-24 NOTE — Progress Notes (Signed)
Right upper extremity venous duplex has been completed. Preliminary results can be found in CV Proc through chart review.  Results were given to Noe Gens NP.  03/24/22 12:18 PM Keith Murray RVT

## 2022-03-24 NOTE — Progress Notes (Signed)
  Progress Note   Patient: Keith Murray YOV:[id number] DOB: [birth year] DOA: 03/17/2022     3 DOS: the patient was seen and examined on 03/24/2022   Brief hospital course: 61yo with hx tobacco abuse presented with 4 days hx of sob, cough, found to be hypoxemic with CXR demonstrating multifocal PNA. Pt found to be covid pos, as well as cocaine and marijuana pos. Pt was given course of antiviral and continued on bipap. Pt was agitated and had been continued on precedex in ICU. With precedex being stopped, care was transferred to [facility]. 12/ 28.   Assessment and Plan: Acute hypoxic respiratory failure secondary to covid PNA and strep PNA -completed course of remdesivir -on continued rocephin to complete 7 days -cont bipap as tolerated, on currently -PCCM following along  Acute toxic metabolic encephalopathy -Recenly stopped precedex -seemed agitated this AM -valproic acid started by PCCM -On CIWA  Hx Lung Cancer -Followed by Oncology  Tobacco abuse -Will need cessation  Hyponatremia -improved this AM -Follow bmet trends     Subjective: Confused this AM  Physical Exam: Vitals:   03/24/22 1400 03/24/22 1500 03/24/22 1600 03/24/22 1755  BP:  (!) 142/76 (!) 141/100   Pulse: (!) 107 (!) 108 (!) 101 (!) 112  Resp: (!) 38 (!) 28 (!) 41 (!) 36  Temp:   100 F (37.8 C)   TempSrc:   Axillary   SpO2: 94% 96% 96% 94%  Weight:      Height:       General exam: Awake, laying in bed,  appears anxious Respiratory system: Normal respiratory effort, no wheezing Cardiovascular system: regular rate, s1, s2 Gastrointestinal system: Soft, nondistended, positive BS Central nervous system: CN2-12 grossly intact, strength intact Extremities: Perfused, no clubbing Skin: Normal skin turgor, no notable skin lesions seen Psychiatry: Mood normal // no visual hallucinations   Data Reviewed:  Na 134, Cr 0.88  Family Communication: Pt in room, familyi not at bedside  Disposition: Status is:  Inpatient Remains inpatient appropriate because: Severity of illness  Planned Discharge Destination: Home     Author: Marylu Lund, MD 03/24/2022 6:11 PM  For on call review [email or web address].

## 2022-03-24 NOTE — IPAL (Signed)
Interdisciplinary Goals of Care Family Meeting   Date carried out:: 03/24/2022  Location of the meeting: Phone call  Member's involved: Physician, Bedside Registered Nurse, and Family Member or next of kin  Durable Power of Attorney or acting medical decision maker: [name]    Discussion: We discussed goals of care for [employer] .    The Clinical status was relayed to patient's Niece over the phone in detail.   Updated and notified of patients medical condition.   Patient is having a weak cough and struggling to remove secretions.   Patient with increased WOB and using accessory muscles to breathe Explained to family course of therapy and the modalities   Patient has expressed previously to his family that he would not want to suffer if things to worse, I explained to patient's Niece, who is a NP that if he gets intubated it will be unlikely that he will come off of it considering hx of stage 4 lung Ca and now with Covid 19 and penumococcal PNA. With his previous wishes in consideration, decision was to continue with HFNC and hope his condition starts getting better, but if he starts getting worse from here then will proceed with comfort care.  Code status: Full DNR  Disposition: Continue current acute care    Family are satisfied with Plan of action and management. All questions answered   Jacky Kindle MD [facility] See Amion for pager If no response to pager, please call [phone] until 7pm After 7pm, Please call E-link [phone]

## 2022-03-24 NOTE — Progress Notes (Addendum)
   NAME:  Keith Murray, MRN:  [id number], DOB:  04/17/1960, LOS: 3 ADMISSION DATE:  03/10/2022, CONSULTATION DATE:  03/24/2022 REFERRING MD:  Dr. Langston Masker, ER, CHIEF COMPLAINT:  SOB   History of Present Illness:  61 yo male smoker presented to [facility] ER with 4 days of dyspnea and cough.  Found to have SpO2 67% on RA.  CXR showed multifocal pneumonia and tested positive for COVID 19.  Started on Bipap in ER.  PCCM asked to assess for ICU admission.  Pertinent  Medical History  Anxiety, Lung cancer, Cocaine abuse, ETOH, Schizoaffective disorder  Significant Hospital Events: Including procedures, antibiotic start and stop dates in addition to other pertinent events   12/25 Admit, start Bipap, start remdesivir/tocilizumab/decadron 12/27 Not cooperative with staff for basic care, remains on bipap, CIWA   Interim History / Subjective:  Tmax 99.9  RN reports pt had agitated delirium overnight, low dose precedex restarted, had hypotension BC pending, ngtd  I/O 1.3L UOP, +615ml in last 24 hours  Objective   Blood pressure 106/71, pulse 95, temperature 99.9 F (37.7 C), temperature source Axillary, resp. rate (!) 32, height 6\' 1"  (1.854 m), weight 63.8 kg, SpO2 100 %.    FiO2 (%):  [60 %] 60 %   Intake/Output Summary (Last 24 hours) at 03/24/2022 0803 Last data filed at 03/24/2022 0600 Gross per 24 hour  Intake 2009.29 ml  Output 1365 ml  Net 644.29 ml   Filed Weights   03/23/2022 2026 03/22/22 0644 03/24/22 0358  Weight: 68 kg 60.8 kg 63.8 kg    Examination: General: adult male lying in bed in NAD on BiPAP   HEENT: MM pink/dry, bipap mask in place, anicteric  Neuro: Awakens, alert, then drifts back to sleep, MAE  CV: s1s2 RRR, no m/r/g PULM: non-labored at rest, lungs bilaterally coarse with rhonchi  GI: soft, bsx4 active  Extremities: warm/dry, RUE edema  Skin: no rashes or lesions  Resolved Hospital Problem list     Assessment & Plan:   Acute Hypoxic Respiratory Failure 2nd to  COVID 19 Pneumonia, Strep Antigen Positive LE doppler negative.  -intermittent BiPAP for respiratory support  -O2 for sats >90% -duoneb Q4 PRN  -reduce solumedrol to 20 mg IV QD  -complete 7 days ceftriaxone for strep antigen positive, D3 -pulmonary hygiene -IS, mobilize -follow intermittent CXR  -PRN guaifenesin-dextromethorphan  Acute Metabolic / Toxic Encephalopathy  Hx of Schizoaffective Disorder Hx of Polysubstance Abuse- ETOH, cocaine -stop precedex  -continue prozac, cogentin, haldol, neurontin, trazodone -add valproic acid  -librium taper as ordered  -PRN ativen for CIWA  -delirium precautions  Hx of Lung Cancer with continued Tobacco Abuse  -followed by Dr. Julien Nordmann from [place]  -smoking cessation counseling when pt able to participate   Hyponatremia  -trend BMP  -diet as tolerated   Steroid Induced Hyperglycemia. -SSI, sensitive scale  -reduce steroids as may be impacting delirium   RUE Swelling  -assess venous duplex  Best Practice (right click and "Reselect all SmartList Selections" daily)  Diet/type: Regular consistency (see orders) DVT prophylaxis: LMWH GI prophylaxis: PPI Lines: N/A Foley:  N/A Code Status:  full code Last date of multidisciplinary goals of care discussion:      Noe Gens, MSN, APRN, NP-C, AGACNP-BC [facility] 03/24/2022, 8:03 AM   Please see [email or web address] for pager details.   From 7A-7P if no response, please call [phone] After hours, please call ELink [phone]

## 2022-03-24 NOTE — Progress Notes (Signed)
Notified Elink RN Lysbeth Galas in regards to patient's respiratory status and vital signs. Lysbeth Galas RN notifying Dr. Renaldo Reel of patient's situation, respiratory status, and vitals.

## 2022-03-24 NOTE — Progress Notes (Signed)
Notified CCM NP Noe Gens at 1744 in regards to patient's O2 Sats consistently remaining in the mid 80s on 15L HFNC. CCM NP Noe Gens ordered Heated HFNC. Notified Respiratory Therapy in regards to order.   Patient removed heated HFNC, O2 Sats decreased into the mid 70s. Placed Heated HFNC back on patient. Patient's RR between the 40s-50s, HR anywhere from 110s-145. Notified MD Icard at [facility] MD Icard was going to reach out to the patient's niece in regards to his situation.

## 2022-03-24 NOTE — Hospital Course (Signed)
61yo with hx tobacco abuse presented with 4 days hx of sob, cough, found to be hypoxemic with CXR demonstrating multifocal PNA. Pt found to be covid pos, as well as cocaine and marijuana pos. Pt was given course of antiviral and continued on bipap. Pt was agitated and had been continued on precedex in ICU. With precedex being stopped, care was transferred to [facility]. 12/ 28.

## 2022-03-24 NOTE — Progress Notes (Signed)
eLink Physician-Brief Progress Note Patient Name: Yaxiel Minnie DOB: [birth year] MRN: [id number]   Date of Service  03/24/2022  HPI/Events of Note  Covid patient with Strept pneumonia pneumonia and acute respiratory failure currently on HHFNC at 50 liters with RR 40-50's and fluctuating saturation.  eICU Interventions  PCCM ground crew requested to assess patient for possible intubation.        Kerry Kass [place] 03/24/2022, 7:53 PM

## 2022-03-25 ENCOUNTER — Inpatient Hospital Stay (HOSPITAL_COMMUNITY): Payer: Medicare Other

## 2022-03-25 DIAGNOSIS — J189 Pneumonia, unspecified organism: Secondary | ICD-10-CM | POA: Diagnosis not present

## 2022-03-25 DIAGNOSIS — U071 COVID-19: Secondary | ICD-10-CM | POA: Diagnosis not present

## 2022-03-25 DIAGNOSIS — R0902 Hypoxemia: Secondary | ICD-10-CM | POA: Diagnosis not present

## 2022-03-25 DIAGNOSIS — E440 Moderate protein-calorie malnutrition: Secondary | ICD-10-CM | POA: Insufficient documentation

## 2022-03-25 DIAGNOSIS — E871 Hypo-osmolality and hyponatremia: Secondary | ICD-10-CM | POA: Diagnosis not present

## 2022-03-25 LAB — GLUCOSE, CAPILLARY
Glucose-Capillary: 111 mg/dL — ABNORMAL HIGH (ref 70–99)
Glucose-Capillary: 114 mg/dL — ABNORMAL HIGH (ref 70–99)
Glucose-Capillary: 119 mg/dL — ABNORMAL HIGH (ref 70–99)
Glucose-Capillary: 95 mg/dL (ref 70–99)
Glucose-Capillary: 97 mg/dL (ref 70–99)
Glucose-Capillary: 97 mg/dL (ref 70–99)

## 2022-03-25 LAB — COMPREHENSIVE METABOLIC PANEL
ALT: 25 U/L (ref 0–44)
AST: 26 U/L (ref 15–41)
Albumin: 2.2 g/dL — ABNORMAL LOW (ref 3.5–5.0)
Alkaline Phosphatase: 102 U/L (ref 38–126)
Anion gap: 5 (ref 5–15)
BUN: 16 mg/dL (ref 8–23)
CO2: 26 mmol/L (ref 22–32)
Calcium: 8.2 mg/dL — ABNORMAL LOW (ref 8.9–10.3)
Chloride: 106 mmol/L (ref 98–111)
Creatinine, Ser: 0.71 mg/dL (ref 0.61–1.24)
GFR, Estimated: >60 mL/min (ref >60)
Glucose, Bld: 96 mg/dL (ref 70–99)
Potassium: 4.2 mmol/L (ref 3.5–5.1)
Sodium: 137 mmol/L (ref 135–145)
Total Bilirubin: 0.6 mg/dL (ref 0.3–1.2)
Total Protein: 6.3 g/dL — ABNORMAL LOW (ref 6.5–8.1)

## 2022-03-25 LAB — D-DIMER, QUANTITATIVE: D-Dimer, Quant: 3.29 ug/mL-FEU — ABNORMAL HIGH (ref 0.00–0.50)

## 2022-03-25 LAB — CBC
HCT: 34.5 % — ABNORMAL LOW (ref 39.0–52.0)
Hemoglobin: 11.6 g/dL — ABNORMAL LOW (ref 13.0–17.0)
MCH: 31.3 pg (ref 26.0–34.0)
MCHC: 33.6 g/dL (ref 30.0–36.0)
MCV: 93 fL (ref 80.0–100.0)
Platelets: 409 10*3/uL — ABNORMAL HIGH (ref 150–400)
RBC: 3.71 MIL/uL — ABNORMAL LOW (ref 4.22–5.81)
RDW: 16.5 % — ABNORMAL HIGH (ref 11.5–15.5)
WBC: 6.7 10*3/uL (ref 4.0–10.5)
nRBC: 0 % (ref 0.0–0.2)

## 2022-03-25 LAB — MAGNESIUM: Magnesium: 2.1 mg/dL (ref 1.7–2.4)

## 2022-03-25 LAB — C-REACTIVE PROTEIN: CRP: 7.3 mg/dL — ABNORMAL HIGH (ref <1.0)

## 2022-03-25 MED ORDER — CHLORDIAZEPOXIDE HCL 25 MG PO CAPS
25.0000 mg | ORAL_CAPSULE | ORAL | Status: AC
  Administered 2022-03-25: 25 mg
  Filled 2022-03-25: qty 1

## 2022-03-25 MED ORDER — GUAIFENESIN-DM 100-10 MG/5ML PO SYRP
10.0000 mL | ORAL_SOLUTION | ORAL | Status: DC | PRN
  Administered 2022-03-25: 10 mL
  Filled 2022-03-25: qty 10

## 2022-03-25 MED ORDER — TRAZODONE HCL 50 MG PO TABS
100.0000 mg | ORAL_TABLET | Freq: Every day | ORAL | Status: DC
  Administered 2022-03-25 – 2022-03-27 (×3): 100 mg
  Filled 2022-03-25 (×3): qty 2

## 2022-03-25 MED ORDER — CHLORDIAZEPOXIDE HCL 25 MG PO CAPS
25.0000 mg | ORAL_CAPSULE | Freq: Every day | ORAL | Status: AC
  Administered 2022-03-25: 25 mg via ORAL
  Filled 2022-03-25: qty 1

## 2022-03-25 MED ORDER — HALOPERIDOL 5 MG PO TABS
10.0000 mg | ORAL_TABLET | Freq: Two times a day (BID) | ORAL | Status: DC
  Administered 2022-03-25 – 2022-03-28 (×7): 10 mg
  Filled 2022-03-25 (×7): qty 2

## 2022-03-25 MED ORDER — FLUOXETINE HCL 20 MG PO CAPS
20.0000 mg | ORAL_CAPSULE | Freq: Every day | ORAL | Status: DC
  Administered 2022-03-25 – 2022-03-28 (×4): 20 mg
  Filled 2022-03-25 (×4): qty 1

## 2022-03-25 MED ORDER — VALPROIC ACID 250 MG/5ML PO SOLN
250.0000 mg | Freq: Two times a day (BID) | ORAL | Status: DC
  Administered 2022-03-25 – 2022-03-28 (×7): 250 mg
  Filled 2022-03-25 (×7): qty 5

## 2022-03-25 MED ORDER — ADULT MULTIVITAMIN W/MINERALS CH
1.0000 | ORAL_TABLET | Freq: Every day | ORAL | Status: DC
  Administered 2022-03-25 – 2022-03-28 (×4): 1
  Filled 2022-03-25 (×4): qty 1

## 2022-03-25 MED ORDER — GABAPENTIN 300 MG PO CAPS
300.0000 mg | ORAL_CAPSULE | Freq: Every day | ORAL | Status: DC
  Administered 2022-03-25 – 2022-03-27 (×3): 300 mg
  Filled 2022-03-25 (×3): qty 1

## 2022-03-25 MED ORDER — ACETAMINOPHEN 325 MG PO TABS
650.0000 mg | ORAL_TABLET | Freq: Four times a day (QID) | ORAL | Status: DC | PRN
  Administered 2022-03-26: 650 mg
  Filled 2022-03-25: qty 2

## 2022-03-25 NOTE — Progress Notes (Signed)
   NAME:  Keith Murray, MRN:  [id number], DOB:  02-17-1961, LOS: 4 ADMISSION DATE:  03/23/2022, CONSULTATION DATE:  03/05/2022 REFERRING MD:  Dr. Langston Masker, ER, CHIEF COMPLAINT:  SOB   History of Present Illness:  61 yo male smoker presented to [facility] ER with 4 days of dyspnea and cough.  Found to have SpO2 67% on RA.  CXR showed multifocal pneumonia and tested positive for COVID 19.  Started on Bipap in ER.  PCCM asked to assess for ICU admission.  Pertinent  Medical History  Anxiety, Lung cancer, Cocaine abuse, ETOH, Schizoaffective disorder  Significant Hospital Events: Including procedures, antibiotic start and stop dates in addition to other pertinent events   12/25 Admit, start Bipap, start remdesivir/tocilizumab/decadron 12/27 Not cooperative with staff for basic care, remains on bipap, CIWA  12/28 Heated high flow initiated. DNR established.   Interim History / Subjective:  RN reports pt more interactive, calm.   Tmax 99.3 / WBC 6.3  On heated high flow 30L / 100%  Objective   Blood pressure (!) 147/93, pulse (!) 117, temperature 99.3 F (37.4 C), temperature source Axillary, resp. rate (!) 30, height 6\' 1"  (1.854 m), weight 63.8 kg, SpO2 92 %.    FiO2 (%):  [80 %-100 %] 100 %   Intake/Output Summary (Last 24 hours) at 03/25/2022 7741 Last data filed at 03/25/2022 0026 Gross per 24 hour  Intake 1147.83 ml  Output 1875 ml  Net -727.17 ml   Filed Weights   03/02/2022 2026 03/22/22 0644 03/24/22 0358  Weight: 68 kg 60.8 kg 63.8 kg    Examination: General: chronically ill appaering adult male sitting up in bed in NAD  HEENT: MM pink/dry, poor dentition, anicteric, [place] O2 Neuro: Awake, alert, oriented to self, place, follows commands  CV: s1s2 RRR, ST on monitor, no m/r/g PULM: non-labored at rest, lungs bilaterally with rhonchi GI: soft, bsx4 active  Extremities: warm/dry, RUE edema (DVT negative) Skin: no rashes or lesions. Thickened long toenails   Resolved Hospital  Problem list     Assessment & Plan:   Acute Hypoxic Respiratory Failure 2nd to COVID 19 Pneumonia, Strep Antigen Positive LE doppler negative.  -continue heated high flow, wean for sats >90% -duoneb Q4 PRN  -solumedrol 20 mg IV QD, wean to off  -D4/7 ceftriaxone  -follow intermittent CXR  -PRN cough suppression  -DNR/DNI  Acute Metabolic / Toxic Encephalopathy  Hx of Schizoaffective Disorder Hx of Polysubstance Abuse- ETOH, cocaine -continue valproic acid, prozac, cogentin, haldol, neurontin, trazodone  -librium taper  -PRN ativan with CIWA  -delirium precautions -PT efforts  Hx of Lung Cancer with continued Tobacco Abuse  -follows with Dr. Julien Nordmann  -smoking cessation counseling when pt able to participate   Hyponatremia  -per [facility]  Steroid Induced Hyperglycemia. -per [facility]  -continue TF   RUE Swelling  Neg for DVT -supportive care   Best Practice (right click and "Reselect all SmartList Selections" daily)  Diet/type: Regular consistency (see orders) DVT prophylaxis: LMWH GI prophylaxis: PPI Lines: N/A Foley:  N/A Code Status:  full code Last date of multidisciplinary goals of care discussion: 12/28, per Dr. Tacy Learn.  DNR / DNI  PCCM will be available PRN. Please call back if new needs arise.      Noe Gens, MSN, APRN, NP-C, AGACNP-BC [facility] 03/25/2022, 8:52 AM   Please see [email or web address] for pager details.   From 7A-7P if no response, please call [phone] After hours, please call ELink [phone]

## 2022-03-25 NOTE — Plan of Care (Signed)
  Problem: Education: Goal: Knowledge of risk factors and measures for prevention of condition will improve Outcome: Not Progressing   Problem: Coping: Goal: Psychosocial and spiritual needs will be supported Outcome: Not Progressing   Problem: Respiratory: Goal: Will maintain a patent airway Outcome: Not Progressing Goal: Complications related to the disease process, condition or treatment will be avoided or minimized Outcome: Not Progressing   Problem: Education: Goal: Ability to describe self-care measures that may prevent or decrease complications (Diabetes Survival Skills Education) will improve Outcome: Not Progressing Goal: Individualized Educational Video(s) Outcome: Not Progressing   Problem: Coping: Goal: Ability to adjust to condition or change in health will improve Outcome: Not Progressing   Problem: Fluid Volume: Goal: Ability to maintain a balanced intake and output will improve Outcome: Not Progressing   Problem: Health Behavior/Discharge Planning: Goal: Ability to identify and utilize available resources and services will improve Outcome: Not Progressing Goal: Ability to manage health-related needs will improve Outcome: Not Progressing   Problem: Metabolic: Goal: Ability to maintain appropriate glucose levels will improve Outcome: Not Progressing   Problem: Nutritional: Goal: Maintenance of adequate nutrition will improve Outcome: Not Progressing Goal: Progress toward achieving an optimal weight will improve Outcome: Not Progressing   Problem: Skin Integrity: Goal: Risk for impaired skin integrity will decrease Outcome: Not Progressing   Problem: Tissue Perfusion: Goal: Adequacy of tissue perfusion will improve Outcome: Not Progressing   Problem: Education: Goal: Knowledge of General Education information will improve Description: Including pain rating scale, medication(s)/side effects and non-pharmacologic comfort measures Outcome: Not  Progressing   Problem: Health Behavior/Discharge Planning: Goal: Ability to manage health-related needs will improve Outcome: Not Progressing   Problem: Clinical Measurements: Goal: Ability to maintain clinical measurements within normal limits will improve Outcome: Not Progressing Goal: Will remain free from infection Outcome: Not Progressing Goal: Diagnostic test results will improve Outcome: Not Progressing Goal: Respiratory complications will improve Outcome: Not Progressing Goal: Cardiovascular complication will be avoided Outcome: Not Progressing   Problem: Activity: Goal: Risk for activity intolerance will decrease Outcome: Not Progressing   Problem: Nutrition: Goal: Adequate nutrition will be maintained Outcome: Not Progressing   Problem: Coping: Goal: Level of anxiety will decrease Outcome: Not Progressing   Problem: Elimination: Goal: Will not experience complications related to bowel motility Outcome: Not Progressing Goal: Will not experience complications related to urinary retention Outcome: Not Progressing   Problem: Safety: Goal: Ability to remain free from injury will improve Outcome: Not Progressing   Problem: Skin Integrity: Goal: Risk for impaired skin integrity will decrease Outcome: Not Progressing   Problem: Safety: Goal: Non-violent Restraint(s) Outcome: Not Progressing Patient total care alert to self only unable to make all needs known does follow some commands when asked with moving ext only

## 2022-03-25 NOTE — Progress Notes (Signed)
  Progress Note   Patient: Keith Murray TDS:[id number] DOB: [birth year] DOA: 03/02/2022     4 DOS: the patient was seen and examined on 03/25/2022   Brief hospital course: 61yo with hx tobacco abuse presented with 4 days hx of sob, cough, found to be hypoxemic with CXR demonstrating multifocal PNA. Pt found to be covid pos, as well as cocaine and marijuana pos. Pt was given course of antiviral and continued on bipap. Pt was agitated and had been continued on precedex in ICU. With precedex being stopped, care was transferred to [facility] 12/ 28.   Assessment and Plan: Acute hypoxic respiratory failure secondary to covid PNA and strep PNA -completed course of remdesivir -on continued rocephin to complete 7 days -Overnight events noted, interval worsening condition where code status was re-addressed and pt's wishes were noted to be DNR/DNI -This AM, ordered and reviewed repeat CXR. Findings of worsening on L, however appears improved on R -PCCM had been following along. Per PCCM, likely will have a prolonged O2 requirement that will take a very long time to wean down -Given worsening condition overnight, did consult Palliative Care to further discuss goals.  Acute toxic metabolic encephalopathy -Recenly stopped precedex -confused this AM -valproic acid started by PCCM -On CIWA, received ativan this AM  Hx Lung Cancer -Followed by Oncology  Tobacco abuse -Will need cessation  Hyponatremia -Normalized -Follow bmet trends     Subjective: Remains confused this AM  Physical Exam: Vitals:   03/25/22 1400 03/25/22 1500 03/25/22 1600 03/25/22 1603  BP: (!) 98/59 135/84 106/67   Pulse: 90 87 83   Resp: (!) 31 (!) 35 (!) 21   Temp:    99 F (37.2 C)  TempSrc:    Oral  SpO2: 94% 97% 100%   Weight:      Height:       General exam: Not conversant, in no acute distress Respiratory system: normal chest rise, clear, no audible wheezing Cardiovascular system: regular rhythm,  s1-s2 Gastrointestinal system: Nondistended, nontender, pos BS Central nervous system: No seizures, no tremors Extremities: No cyanosis, no joint deformities Skin: No rashes, no pallor Psychiatry: Affect normal // no auditory hallucinations   Data Reviewed:  Labs reviewed: Na 137, K 4.2, Cr 0.71  Family Communication: Pt in room, familyi not at bedside  Disposition: Status is: Inpatient Remains inpatient appropriate because: Severity of illness  Planned Discharge Destination: Home    Author: Marylu Lund, MD 03/25/2022 4:53 PM  For on call review [email or web address].

## 2022-03-25 NOTE — Progress Notes (Signed)
Initial Nutrition Assessment  DOCUMENTATION CODES:   Non-severe (moderate) malnutrition in context of chronic illness  INTERVENTION:  - Patient currently on Carb Modified diet.  - Consider formal SLP eval as indicated to assess patient's ability to take oral nutrition while confused.  - If patient able to take PO would recommend Ensure Enlive po TID, each supplement provides 350 kcal and 20 grams of protein.  - Patient with NGT, currently receiving Vital High Protein at 85mL/hr.  - Provides 480 kcals, 42g protein, and 481mL free water from formula over 24 hours.  - Monitor magnesium, potassium, and phosphorus BID for at least 3 days, MD to replete as needed, as pt is at risk for refeeding syndrome.   - As patient taking in very little PO, would recommend increasing tube feeds to meet estimated needs if medically appropriate.  Recommendations below, if MD deems appropriate to increase tube feeds:  Vital 1.5 at 55 ml/h (1320 ml per day) *Would recommend starting at 105mL/hr and advancing by 58mL Q12H  Provides 1980 kcal, 89 gm protein, 1008 ml free water daily    NUTRITION DIAGNOSIS:   Moderate Malnutrition related to chronic illness as evidenced by moderate fat depletion, severe muscle depletion.  GOAL:   Patient will meet greater than or equal to 90% of their needs  MONITOR:   PO intake, TF tolerance, Labs, Weight trends  REASON FOR ASSESSMENT:   Consult Enteral/tube feeding initiation and management  ASSESSMENT:   61 yo with hx tobacco abuse, anxiety, Lung cancer, Cocaine abuse, ETOH, Schizoaffective disorder who presented with 4 days hx of sob, cough, found to be hypoxemic with CXR demonstrating multifocal PNA. Pt found to be covid pos, as well as cocaine and marijuana pos.  12/25 Admit, on BiPAP 12/28 on heated high flow, DNR established, NG placed and Vital HP at 26mL/hr started 12/29 palliative consulted   Patient disoriented, sleepy, and confused during visit.  TFs of Vital HP at 18mL/hr running at time of visit.   Per EMR, last weight PTA was in January when patient was weighed at 150#. Current weight status difficult to assess as patient weighed between 134-150# since admit 4 days ago.  Per discussion with RN, patient was able to take some oral PO yesterday with assistance but not able to eat so far today. He remains on a carb modified diet at this time.   As patient taking in very little PO and TF already running, recommend advancing past trickles and having tube feeds meet estimated needs until patient able to take oral nutrition.  Awaiting MD response on if able to increase tube feeds.    Medications reviewed and include: Insulin, MVI, Miralax  Labs reviewed:  -   NUTRITION - FOCUSED PHYSICAL EXAM:  Flowsheet Row Most Recent Value  Orbital Region Severe depletion  Upper Arm Region Moderate depletion  Thoracic and Lumbar Region Unable to assess  Buccal Region Moderate depletion  Temple Region Moderate depletion  Clavicle Bone Region Severe depletion  Clavicle and Acromion Bone Region Severe depletion  Scapular Bone Region Unable to assess  Dorsal Hand Unable to assess  Patellar Region Unable to assess  Anterior Thigh Region Unable to assess  Posterior Calf Region Unable to assess  Edema (RD Assessment) None  Hair Reviewed  Eyes Reviewed  Mouth Reviewed  Skin Reviewed  Nails Reviewed       Diet Order:   Diet Order             Diet Carb Modified  Fluid consistency: Thin; Room service appropriate? Yes  Diet effective now                   EDUCATION NEEDS:  Not appropriate for education at this time  Skin:  Skin Assessment: Reviewed RN Assessment  Last BM:  PTA  Height:  Ht Readings from Last 1 Encounters:  03/22/22 6\' 1"  (1.854 m)   Weight:  Wt Readings from Last 1 Encounters:  03/24/22 63.8 kg    BMI:  Body mass index is 18.56 kg/m.  Estimated Nutritional Needs:  Kcal:  1900-2100 kcals Protein:   90-100 grams Fluid:  >/= 1.9L    Samson Frederic RD, LDN For contact information, refer to [facility].

## 2022-03-25 NOTE — Progress Notes (Addendum)
1915 patient alert to self unable to make needs known, most of patients speech is not comprehensible. Patient follows commands on trickle Tube Feeding 02 30 liters and 90% High flow, foley CDI. 2000 IV in right arm removed arm swollen throughout IV placed in left inner forearm  2130 patient trying to get out bed pulled off oxygen SP02 in 70s when taking it off patient will not keep it on pulling at foley and core-track Prn medications given

## 2022-03-25 NOTE — TOC Progression Note (Signed)
Transition of Care [facility]) - Progression Note    Patient Details  Name: Keith Murray MRN: [id number] Date of Birth: 05/28/1960  Transition of Care [facility] CM/SW [place], RN Phone Number: 03/25/2022, 6:19 PM  Clinical Narrative:   No current TOC needs, will continue to follow for needs.         Expected Discharge Plan and Services                                               Social Determinants of Health (SDOH) Interventions SDOH Screenings   Alcohol Screen: Low Risk  (02/15/2017)  Tobacco Use: High Risk (01/27/2022)    Readmission Risk Interventions     No data to display

## 2022-03-26 DIAGNOSIS — Z515 Encounter for palliative care: Secondary | ICD-10-CM | POA: Diagnosis not present

## 2022-03-26 DIAGNOSIS — E871 Hypo-osmolality and hyponatremia: Secondary | ICD-10-CM | POA: Diagnosis not present

## 2022-03-26 DIAGNOSIS — U071 COVID-19: Secondary | ICD-10-CM

## 2022-03-26 DIAGNOSIS — Z7189 Other specified counseling: Secondary | ICD-10-CM | POA: Diagnosis not present

## 2022-03-26 DIAGNOSIS — R531 Weakness: Secondary | ICD-10-CM | POA: Diagnosis not present

## 2022-03-26 DIAGNOSIS — J189 Pneumonia, unspecified organism: Secondary | ICD-10-CM | POA: Diagnosis not present

## 2022-03-26 LAB — CBC
HCT: 34.2 % — ABNORMAL LOW (ref 39.0–52.0)
Hemoglobin: 11.4 g/dL — ABNORMAL LOW (ref 13.0–17.0)
MCH: 30.7 pg (ref 26.0–34.0)
MCHC: 33.3 g/dL (ref 30.0–36.0)
MCV: 92.2 fL (ref 80.0–100.0)
Platelets: 386 10*3/uL (ref 150–400)
RBC: 3.71 MIL/uL — ABNORMAL LOW (ref 4.22–5.81)
RDW: 15.1 % (ref 11.5–15.5)
WBC: 6.5 10*3/uL (ref 4.0–10.5)
nRBC: 0 % (ref 0.0–0.2)

## 2022-03-26 LAB — GLUCOSE, CAPILLARY
Glucose-Capillary: 103 mg/dL — ABNORMAL HIGH (ref 70–99)
Glucose-Capillary: 113 mg/dL — ABNORMAL HIGH (ref 70–99)
Glucose-Capillary: 122 mg/dL — ABNORMAL HIGH (ref 70–99)
Glucose-Capillary: 75 mg/dL (ref 70–99)
Glucose-Capillary: 94 mg/dL (ref 70–99)
Glucose-Capillary: 99 mg/dL (ref 70–99)

## 2022-03-26 LAB — COMPREHENSIVE METABOLIC PANEL
ALT: 26 U/L (ref 0–44)
AST: 30 U/L (ref 15–41)
Albumin: 2.4 g/dL — ABNORMAL LOW (ref 3.5–5.0)
Alkaline Phosphatase: 99 U/L (ref 38–126)
Anion gap: 7 (ref 5–15)
BUN: 16 mg/dL (ref 8–23)
CO2: 30 mmol/L (ref 22–32)
Calcium: 8.6 mg/dL — ABNORMAL LOW (ref 8.9–10.3)
Chloride: 101 mmol/L (ref 98–111)
Creatinine, Ser: 0.6 mg/dL — ABNORMAL LOW (ref 0.61–1.24)
GFR, Estimated: >60 mL/min (ref >60)
Glucose, Bld: 107 mg/dL — ABNORMAL HIGH (ref 70–99)
Potassium: 4 mmol/L (ref 3.5–5.1)
Sodium: 138 mmol/L (ref 135–145)
Total Bilirubin: 0.4 mg/dL (ref 0.3–1.2)
Total Protein: 6.2 g/dL — ABNORMAL LOW (ref 6.5–8.1)

## 2022-03-26 LAB — CULTURE, BLOOD (ROUTINE X 2): Culture: NO GROWTH

## 2022-03-26 LAB — MAGNESIUM: Magnesium: 2 mg/dL (ref 1.7–2.4)

## 2022-03-26 MED ORDER — LACTATED RINGERS IV SOLN: INTRAVENOUS | Status: DC

## 2022-03-26 MED ORDER — LACTATED RINGERS IV BOLUS
500.0000 mL | Freq: Once | INTRAVENOUS | Status: AC
  Administered 2022-03-26: 500 mL via INTRAVENOUS

## 2022-03-26 NOTE — Progress Notes (Signed)
Notified MD Wyline Copas in regards to patient's hypotension- BP upper 70s to mid 81M systolic. MD Wyline Copas ordered 500 cc LR bolus and LR maintenance fluids.

## 2022-03-26 NOTE — Consult Note (Signed)
                                                                                 Consultation Note Date: 03/26/2022   Patient Name: Keith Murray  DOB: 10/06/1960  MRN: [id number]  Age / Sex: 61 y.o., male  PCP: Osei-Bonsu, George, MD Referring Physician: Chiu, Stephen K, MD  Reason for Consultation: Establishing goals of care  HPI/Patient Profile: 61 y.o. male   admitted on 03/24/2022   61yo with hx tobacco abuse presented with 4 days hx of sob, cough, found to be hypoxemic with CXR demonstrating multifocal PNA. Pt found to be covid pos, as well as cocaine and marijuana pos. Pt was given course of antiviral and initially required BIPAP and Precedex. Remains in stepdown unit, code status was revised to DNR DNI after discussions with niece Amanda Kelly who is an NP.    Clinical Assessment and Goals of Care:  PMT consult for ongoing goals of care discussions. Patient noted to be resting in bed, has HFNC and NGT. Chart reviewed, palliative consult request received.   Call placed to niece Amanda Kelly. Introduced palliative care as follows: Palliative medicine is specialized medical care for people living with serious illness. It focuses on providing relief from the symptoms and stress of a serious illness. The goal is to improve quality of life for both the patient and the family. Goals of care: Broad aims of medical therapy in relation to the patient's values and preferences. Our aim is to provide medical care aimed at enabling patients to achieve the goals that matter most to them, given the circumstances of their particular medical situation and their constraints.   Briefly discussed with the patient's niece Amanda, introduced role of palliative, time trial to continue for now, she is thankful for the care the patient is receiving here in the hospital as well as for hospital staff keeping her informed about the patient's overall condition.  See below.  NEXT OF KIN  Niece Amanda Kelly NP [phone]  SUMMARY OF RECOMMENDATIONS    Agree with DNR Monitor hospital course and overall disease trajectory of illness, time trial of current interventions and monitor for stabilization/recovery so as to be able to guide further goals of care conversations.  Thank you for the consult.   Code Status/Advance Care Planning: DNR   Symptom Management:     Palliative Prophylaxis:  Frequent Pain Assessment    Psycho-social/Spiritual:  Desire for further Chaplaincy support:yes Additional Recommendations: Caregiving  Support/Resources  Prognosis:  Unable to determine  Discharge Planning: To Be Determined      Primary Diagnoses: Present on Admission:  COVID-19  Pneumonia of both lower lobes due to infectious organism   I have reviewed the medical record, interviewed the patient and family, and examined the patient. The following aspects are pertinent.  Past Medical History:  Diagnosis Date   Anxiety    met lung ca dx'd 10/2015   lung   Polysubstance abuse (HCC)    cocaine, etoh, marijuana   Psychosis (HCC)    Schizoaffective disorder (HCC)    Social History   Socioeconomic History   Marital status: Single      Spouse name: Not on file   Number of children: Not on file   Years of education: Not on file   Highest education level: Not on file  Occupational History   Not on file  Tobacco Use   Smoking status: Every Day    Packs/day: 0.25    Years: 4.00    Total pack years: 1.00    Types: Cigarettes   Smokeless tobacco: Never  Substance and Sexual Activity   Alcohol use: Yes    Comment: occasionally drinks 1 to 2 beers   Drug use: Yes    Types: Cocaine, Marijuana    Comment: THC about once per week and cocaine on occasion.    Sexual activity: Yes    Birth control/protection: Condom  Other Topics Concern   Not on file  Social History Narrative   Not on file   Social Determinants of Health   Financial Resource Strain: Not on file  Food Insecurity: Not on  file  Transportation Needs: Not on file  Physical Activity: Not on file  Stress: Not on file  Social Connections: Not on file   Family History  Problem Relation Age of Onset   Cancer Neg Hx    Schizophrenia Neg Hx    Scheduled Meds:  benztropine  0.5 mg Oral BID   Chlorhexidine Gluconate Cloth  6 each Topical Daily   enoxaparin (LOVENOX) injection  40 mg Subcutaneous Q24H   feeding supplement (VITAL HIGH PROTEIN)  1,000 mL Per Tube Q24H   FLUoxetine  20 mg Per Tube Daily   gabapentin  300 mg Per Tube QHS   haloperidol  10 mg Per Tube BID   insulin aspart  0-9 Units Subcutaneous Q4H   methylPREDNISolone (SOLU-MEDROL) injection  20 mg Intravenous Daily   multivitamin with minerals  1 tablet Per Tube Daily   mouth rinse  15 mL Mouth Rinse 4 times per day   polyethylene glycol  17 g Per Tube Daily   traZODone  100 mg Per Tube QHS   valproic acid  250 mg Per Tube BID   Continuous Infusions:  cefTRIAXone (ROCEPHIN)  IV Stopped (03/25/22 2007)   PRN Meds:.acetaminophen, guaiFENesin-dextromethorphan, ipratropium-albuterol, LORazepam, mouth rinse Medications Prior to Admission:  Prior to Admission medications   Medication Sig Start Date End Date Taking? Authorizing Provider  benztropine (COGENTIN) 0.5 MG tablet Take 1 tablet (0.5 mg total) by mouth 2 (two) times daily. 05/17/16  Yes Agustin, Sheila, NP  FLUoxetine (PROZAC) 20 MG capsule Take 1 capsule (20 mg total) by mouth daily. 05/18/16  Yes Agustin, Sheila, NP  gabapentin (NEURONTIN) 300 MG capsule Take 300 mg by mouth daily. 01/03/20  Yes [provider]  haloperidol (HALDOL) 10 MG tablet Take 10 mg by mouth 2 (two) times daily. 01/03/20  Yes [provider]  haloperidol decanoate (HALDOL DECANOATE) 100 MG/ML injection Inject 100 mg into the muscle every 28 (twenty-eight) days. 03/19/21  Yes [provider]  traZODone (DESYREL) 100 MG tablet Take 1 tablet (100 mg total) by mouth at bedtime. 05/17/16  Yes  Agustin, Sheila, NP   No Known Allergies Review of Systems Non verbal Physical Exam Weak appearing gentleman On NGT On HFNC Monitor noted Abdomen not distended   Vital Signs: BP (!) 92/55   Pulse 87   Temp 99.6 F (37.6 C) (Axillary)   Resp (!) 24   Ht 6' 1" (1.854 m)   Wt 65.4 kg   SpO2 95%   BMI 19.02 kg/m  Pain Scale:   PAINAD   Pain Score: Asleep   SpO2: SpO2: 95 % O2 Device:SpO2: 95 % O2 Flow Rate: .O2 Flow Rate (L/min): 40 L/min  IO: Intake/output summary:  Intake/Output Summary (Last 24 hours) at 03/26/2022 1359 Last data filed at 03/26/2022 1300 Gross per 24 hour  Intake 859.86 ml  Output 1500 ml  Net -640.14 ml    LBM: Last BM Date :  (PTA) Baseline Weight: Weight: 68 kg Most recent weight: Weight: 65.4 kg     Palliative Assessment/Data:   PPS 30%  Time In:  1300 Time Out:  1400 Time Total:  60  Greater than 50%  of this time was spent counseling and coordinating care related to the above assessment and plan.  Signed by: Loistine Chance, MD   Please contact Palliative Medicine Team phone at [phone] for questions and concerns.  For individual provider: See Shea Evans

## 2022-03-26 NOTE — Progress Notes (Addendum)
  Progress Note   Patient: Keith Murray EHU:[id number] DOB: May 22, 1960 DOA: 03/06/2022     5 DOS: the patient was seen and examined on 03/26/2022   Brief hospital course: 61yo with hx tobacco abuse presented with 4 days hx of sob, cough, found to be hypoxemic with CXR demonstrating multifocal PNA. Pt found to be covid pos, as well as cocaine and marijuana pos. Pt was given course of antiviral and continued on bipap. Pt was agitated and had been continued on precedex in ICU. With precedex being stopped, care was transferred to [facility] 12/ 28.   Assessment and Plan: Acute hypoxic respiratory failure secondary to covid PNA and strep PNA -completed course of remdesivir -on continued rocephin to complete 7 days -Overnight events noted, interval worsening condition where code status was re-addressed and pt's wishes were noted to be DNR/DNI -This AM, ordered and reviewed repeat CXR. Findings of worsening on L, however appears improved on R -PCCM had been following along. Per PCCM, likely will have a prolonged O2 requirement that will take a very long time to wean down -Given worsening condition overnight, did consult Palliative Care to further discuss goals. -this am on 40% high flow O2, cont to wean as tolerated  Acute toxic metabolic encephalopathy -Recenly stopped precedex -confused this AM -valproic acid started by PCCM -still lethargic, but more conversant today  Hx Lung Cancer -Followed by Oncology  Tobacco abuse -Will need cessation  Hyponatremia -Normalized -Follow bmet trends  Hypotension secondary to hypovolemic shock -limited PO intake thus far -sbp down to the 70's 12/30, improved with IVF bolus -Cont basal IVF as needed    Subjective: More conversant today. Without complaints  Physical Exam: Vitals:   03/26/22 1345 03/26/22 1400 03/26/22 1415 03/26/22 1430  BP: (!) 92/55 (!) 79/53 (!) 79/51 (!) 90/55  Pulse: 87 87 88 87  Resp: (!) 24 (!) 23 (!) 23 (!) 23  Temp:       TempSrc:      SpO2: 95% 91% 93% 94%  Weight:      Height:       General exam: Awake, laying in bed, in nad Respiratory system: Normal respiratory effort, no wheezing Cardiovascular system: regular rate, s1, s2 Gastrointestinal system: Soft, nondistended, positive BS Central nervous system: CN2-12 grossly intact, strength intact Extremities: Perfused, no clubbing Skin: Normal skin turgor, no notable skin lesions seen Psychiatry: Mood normal // no visual hallucinations   Data Reviewed:  Labs reviewed: Na 138, K 4.0, Cr 0.60  Family Communication: Pt in room, familyi not at bedside  Disposition: Status is: Inpatient Remains inpatient appropriate because: Severity of illness  Planned Discharge Destination: Home    Author: Marylu Lund, MD 03/26/2022 4:16 PM  For on call review [email or web address].

## 2022-03-27 ENCOUNTER — Encounter (HOSPITAL_COMMUNITY): Payer: Self-pay | Admitting: Pulmonary Disease

## 2022-03-27 DIAGNOSIS — Z515 Encounter for palliative care: Secondary | ICD-10-CM | POA: Diagnosis not present

## 2022-03-27 DIAGNOSIS — E871 Hypo-osmolality and hyponatremia: Secondary | ICD-10-CM | POA: Diagnosis not present

## 2022-03-27 DIAGNOSIS — J189 Pneumonia, unspecified organism: Secondary | ICD-10-CM | POA: Diagnosis not present

## 2022-03-27 DIAGNOSIS — Z7189 Other specified counseling: Secondary | ICD-10-CM

## 2022-03-27 DIAGNOSIS — Z79899 Other long term (current) drug therapy: Secondary | ICD-10-CM

## 2022-03-27 DIAGNOSIS — U071 COVID-19: Secondary | ICD-10-CM | POA: Diagnosis not present

## 2022-03-27 DIAGNOSIS — E440 Moderate protein-calorie malnutrition: Secondary | ICD-10-CM | POA: Diagnosis not present

## 2022-03-27 DIAGNOSIS — R0600 Dyspnea, unspecified: Secondary | ICD-10-CM

## 2022-03-27 LAB — COMPREHENSIVE METABOLIC PANEL
ALT: 25 U/L (ref 0–44)
AST: 33 U/L (ref 15–41)
Albumin: 2.1 g/dL — ABNORMAL LOW (ref 3.5–5.0)
Alkaline Phosphatase: 91 U/L (ref 38–126)
Anion gap: 7 (ref 5–15)
BUN: 14 mg/dL (ref 8–23)
CO2: 30 mmol/L (ref 22–32)
Calcium: 8.7 mg/dL — ABNORMAL LOW (ref 8.9–10.3)
Chloride: 102 mmol/L (ref 98–111)
Creatinine, Ser: 0.62 mg/dL (ref 0.61–1.24)
GFR, Estimated: >60 mL/min (ref >60)
Glucose, Bld: 105 mg/dL — ABNORMAL HIGH (ref 70–99)
Potassium: 4.3 mmol/L (ref 3.5–5.1)
Sodium: 139 mmol/L (ref 135–145)
Total Bilirubin: 0.3 mg/dL (ref 0.3–1.2)
Total Protein: 5.6 g/dL — ABNORMAL LOW (ref 6.5–8.1)

## 2022-03-27 LAB — GLUCOSE, CAPILLARY
Glucose-Capillary: 106 mg/dL — ABNORMAL HIGH (ref 70–99)
Glucose-Capillary: 110 mg/dL — ABNORMAL HIGH (ref 70–99)
Glucose-Capillary: 100 mg/dL — ABNORMAL HIGH (ref 70–99)
Glucose-Capillary: 94 mg/dL (ref 70–99)

## 2022-03-27 LAB — CULTURE, BLOOD (ROUTINE X 2)
Culture: NO GROWTH
Special Requests: ADEQUATE

## 2022-03-27 LAB — CBC
HCT: 34.9 % — ABNORMAL LOW (ref 39.0–52.0)
Hemoglobin: 11.4 g/dL — ABNORMAL LOW (ref 13.0–17.0)
MCH: 31.1 pg (ref 26.0–34.0)
MCHC: 32.7 g/dL (ref 30.0–36.0)
MCV: 95.4 fL (ref 80.0–100.0)
Platelets: 330 10*3/uL (ref 150–400)
RBC: 3.66 MIL/uL — ABNORMAL LOW (ref 4.22–5.81)
RDW: 15 % (ref 11.5–15.5)
WBC: 6.2 10*3/uL (ref 4.0–10.5)
nRBC: 0 % (ref 0.0–0.2)

## 2022-03-27 MED ORDER — MORPHINE SULFATE (PF) 2 MG/ML IV SOLN
2.0000 mg | INTRAVENOUS | Status: DC | PRN
  Administered 2022-03-27: 2 mg via INTRAVENOUS
  Filled 2022-03-27 (×2): qty 1

## 2022-03-27 NOTE — Progress Notes (Signed)
  Progress Note   Patient: Keith Murray PJA:[id number] DOB: 1960/09/09 DOA: 03/13/2022     6 DOS: the patient was seen and examined on 03/27/2022   Brief hospital course: 61yo with hx tobacco abuse presented with 4 days hx of sob, cough, found to be hypoxemic with CXR demonstrating multifocal PNA. Pt found to be covid pos, as well as cocaine and marijuana pos. Pt was given course of antiviral and continued on bipap. Pt was agitated and had been continued on precedex in ICU. With precedex being stopped, care was transferred to [facility] 12/ 28.   Assessment and Plan: Acute hypoxic respiratory failure secondary to covid PNA and strep PNA -completed course of remdesivir -on continued rocephin to complete 7 days -Recent events noted, interval worsening condition where code status was re-addressed and pt's wishes were noted to be DNR/DNI -This AM, ordered and reviewed repeat CXR. Findings of worsening on L, however appears improved on R -PCCM had been following along. Per PCCM, likely will have a prolonged O2 requirement that will take a very long time to wean down -Given worsening condition overnight, Palliative Care was consulted, recs noted  Acute toxic metabolic encephalopathy -Recenly stopped precedex -valproic acid started by PCCM -more conversant this AM  Hx Lung Cancer -Followed by Oncology  Tobacco abuse -Will need cessation  Hyponatremia -Normalized -Follow bmet trends  Hypotension secondary to hypovolemic shock -limited PO intake thus far -sbp down to the 70's 12/30, BP improved with IVF -On basal IVF  RUE edema -Recent UE doppler results reviewed, no evidence of RUE DVT or superficial thrombus     Subjective: more conversant, without concerns  Physical Exam: Vitals:   03/27/22 0817 03/27/22 0852 03/27/22 0900 03/27/22 1357  BP: 108/71  120/72   Pulse: 83  89 (!) 108  Resp: 19  (!) 26 (!) 28  Temp:  97.7 F (36.5 C)    TempSrc:  Axillary    SpO2: 98%  (!) 89%  91%  Weight:      Height:       General exam: Conversant, in no acute distress Respiratory system: normal chest rise, clear, no audible wheezing Cardiovascular system: regular rhythm, s1-s2 Gastrointestinal system: Nondistended, nontender, pos BS Central nervous system: No seizures, no tremors Extremities: No cyanosis, no joint deformities, RUE edema Skin: No rashes, no pallor Psychiatry: Affect normal // no auditory hallucinations   Data Reviewed:  Labs reviewed: Na 139, K 4.3, Cr 0.62  Family Communication: Pt in room, family not at bedside  Disposition: Status is: Inpatient Remains inpatient appropriate because: Severity of illness  Planned Discharge Destination: Home    Author: Marylu Lund, MD 03/27/2022 2:32 PM  For on call review [email or web address]

## 2022-03-27 NOTE — Progress Notes (Signed)
Daily Progress Note   Patient Name: Keith Murray       Date: 03/27/2022 DOB: 02-Apr-1960  Age: 61 y.o. MRN#: [id number] Attending Physician: Donne Hazel, MD Primary Care Physician: Benito Mccreedy, MD Admit Date: 03/04/2022 Length of Stay: 6 days  Reason for Consultation/Follow-up: Establishing goals of care and symptom management  Subjective:   CC: Patient confused and in mittens.  Spoke with patient's niece, [name].  Following along for complex medical decision making and symptom management.  Subjective:  Extensive review of EMR prior to seeing patient.  Patient's oxygen requirements increased to 50 L/min this morning.  FiO2 also increased to 90%.  Discussed care with bedside RN who noted patient becoming more agitated this morning.  Patient remains in mittens.  Patient also requiring nonrebreather support in addition to high flow nasal cannula to maintain oxygen saturation.  Patient coming combative at times.  Able to call patient's niece, [name], who is a Designer, jewellery.  When calling [name], able to introduce myself as a member of the palliative medicine team.  [name] able to update me regarding patient's journey up into this point.  [name] also noted that she and [name] are patient's only family support.  She informed me that patient's sister had passed away on 03/08/23 so they had just had to work with palliative and then hospice care for patient's sister.  [name] noted that she is a as needed NP for hospice organization on the weekends.   Was able to update [name] regarding patient's increased oxygen requirement needs.  [name] noted that they "remain hopeful while also realistic".  [name] noted that their hope was patient could improve to the point that he could go to SNF for rehab.  While acknowledging this hope, also expressed concern regarding patient's overall medical status.  [name] is very realistic that his tori status may  continue to worsen.  We discussed that if this does happen, would recommend transition to comfort focused care and would call [name] to discuss this.  [name] acknowledged this.  [name] offered if morphine could be used to decrease patient's respiratory status work.  When reviewing vitals patient's respiratory status work has not actually been to increased, it is just that he cannot maintain his oxygen saturation.  Noted would be willing to try morphine boluses to help with work of breathing to see if improves this oxygen saturation and may need to consider morphine drip.  [name] agreeing with this plan.  [name] again confirmed patient is DNR/DNI status.  All questions answered at that time.  Thanked [name] allowing me to speak with her today.  [name] noted she appreciated the update.  Updated care team regarding this conversation.  Review of Systems  Objective:   Vital Signs:  BP 108/71   Pulse 83   Temp 97.7 F (36.5 C) (Axillary)   Resp 19   Ht 6\' 1"  (1.854 m)   Wt 65.4 kg   SpO2 98% Comment: removed NRB  BMI 19.02 kg/m   Physical Exam: General: Chronically ill-appearing, frail with muscle wasting present, confused Eyes: No drainage noted HENT: On high flow nasal cannula as well as nonrebreather mask Cardiovascular: RRR Respiratory: On high flow nasal cannula and nonrebreather for respiratory support Abdomen: not distended Extremities: Muscle wasting present, hands and mittens Skin: no rashes or lesions on visible skin Neuro: Confused, agitated   Imaging: I personally reviewed recent imaging.  Assessment & Plan:   Assessment: Patient is a 61 year old male with a past medical history of tobacco use and substance use who was admitted on 03/22/2022 for management of shortness of breath and cough.  Imaging demonstrated multifocal pneumonia.  Patient was also found to be COVID-positive.  UDS revealed cocaine and marijuana positive as well.  Patient has been  receiving support in stepdown unit for respiratory status.  Palliative medicine team consulted to assist with complex medical decision making and symptom management.  Recommendations/Plan: # Complex medical decision making/goals of care:  - Patient confused and unable to participate in complex medical decision making.  -Patient has 2 nieces listed in chart.  Spoke with [name] who is a Designer, jewellery and works as needed for a hospice agency.  [name] confirm that she and [name] are patient's only family and support.  Patient's sister passed away last month.  -Updated [name] regarding patient's increased oxygen requirements.  [name] noted they "remain hopeful while also being realistic".  While [name] is hoping that patient could improve to the point he could go to rehab to improve his strength after this hospitalization, she is also willing to further discuss things should patient's medical status continue to deteriorate.  -  Code Status: DNR   -Confirmed again with [name] today.  # Symptom management:  -Dyspnea, in the setting of multifocal pneumonia and COVID   [name] wanting to and agreeing with trial of as needed morphine to assist with dyspnea management.   -Start morphine 2 mg every 3 hours as needed.  This is likely a conservative dose in someone who has history of substance use and tolerance usually elevated. Continue to adjust based on symptom response.  If needing frequent dosing, may need to consider morphine drip.  # Psychosocial Support:  -Spoke with patient's niece, [name]  # Discharge Planning: TBD  Discussed with: Carlyle Lipa, bedside RN, hospitalist  Thank you for allowing the palliative care team to participate in the care [facility].  [name], DO Palliative Care Provider PMT # [phone]  If patient remains symptomatic despite maximum doses, please call PMT at [phone] between 0700 and 1900. Outside of these hours,  please call attending, as PMT does not have night coverage.

## 2022-03-28 ENCOUNTER — Inpatient Hospital Stay (HOSPITAL_COMMUNITY): Payer: Medicare Other

## 2022-03-28 DIAGNOSIS — U071 COVID-19: Secondary | ICD-10-CM | POA: Diagnosis not present

## 2022-03-28 DIAGNOSIS — Z515 Encounter for palliative care: Secondary | ICD-10-CM

## 2022-03-28 DIAGNOSIS — R451 Restlessness and agitation: Secondary | ICD-10-CM

## 2022-03-28 DIAGNOSIS — R0902 Hypoxemia: Secondary | ICD-10-CM

## 2022-03-28 DIAGNOSIS — Z66 Do not resuscitate: Secondary | ICD-10-CM

## 2022-03-28 DIAGNOSIS — J189 Pneumonia, unspecified organism: Secondary | ICD-10-CM | POA: Diagnosis not present

## 2022-03-28 DIAGNOSIS — E871 Hypo-osmolality and hyponatremia: Secondary | ICD-10-CM | POA: Diagnosis not present

## 2022-03-28 LAB — GLUCOSE, CAPILLARY
Glucose-Capillary: 100 mg/dL — ABNORMAL HIGH (ref 70–99)
Glucose-Capillary: 87 mg/dL (ref 70–99)
Glucose-Capillary: 92 mg/dL (ref 70–99)
Glucose-Capillary: 99 mg/dL (ref 70–99)

## 2022-03-28 LAB — CBC
HCT: 33.7 % — ABNORMAL LOW (ref 39.0–52.0)
Hemoglobin: 10.7 g/dL — ABNORMAL LOW (ref 13.0–17.0)
MCH: 30.7 pg (ref 26.0–34.0)
MCHC: 31.8 g/dL (ref 30.0–36.0)
MCV: 96.8 fL (ref 80.0–100.0)
Platelets: 297 10*3/uL (ref 150–400)
RBC: 3.48 MIL/uL — ABNORMAL LOW (ref 4.22–5.81)
RDW: 15.1 % (ref 11.5–15.5)
WBC: 5.3 10*3/uL (ref 4.0–10.5)
nRBC: 0 % (ref 0.0–0.2)

## 2022-03-28 LAB — COMPREHENSIVE METABOLIC PANEL
ALT: 27 U/L (ref 0–44)
AST: 33 U/L (ref 15–41)
Albumin: 2.3 g/dL — ABNORMAL LOW (ref 3.5–5.0)
Alkaline Phosphatase: 79 U/L (ref 38–126)
Anion gap: 6 (ref 5–15)
BUN: 17 mg/dL (ref 8–23)
CO2: 33 mmol/L — ABNORMAL HIGH (ref 22–32)
Calcium: 8.3 mg/dL — ABNORMAL LOW (ref 8.9–10.3)
Chloride: 97 mmol/L — ABNORMAL LOW (ref 98–111)
Creatinine, Ser: 0.6 mg/dL — ABNORMAL LOW (ref 0.61–1.24)
GFR, Estimated: >60 mL/min (ref >60)
Glucose, Bld: 103 mg/dL — ABNORMAL HIGH (ref 70–99)
Potassium: 4.4 mmol/L (ref 3.5–5.1)
Sodium: 136 mmol/L (ref 135–145)
Total Bilirubin: 0.2 mg/dL — ABNORMAL LOW (ref 0.3–1.2)
Total Protein: 5.6 g/dL — ABNORMAL LOW (ref 6.5–8.1)

## 2022-03-28 MED ORDER — MORPHINE 100MG IN NS 100ML (1MG/ML) PREMIX INFUSION
4.0000 mg/h | INTRAVENOUS | Status: DC
  Administered 2022-03-28: 2 mg/h via INTRAVENOUS
  Filled 2022-03-28 (×2): qty 100

## 2022-03-28 MED ORDER — HALOPERIDOL LACTATE 5 MG/ML IJ SOLN: 0.5000 mg | INTRAMUSCULAR | Status: DC | PRN

## 2022-03-28 MED ORDER — GLYCOPYRROLATE 0.2 MG/ML IJ SOLN
0.2000 mg | INTRAMUSCULAR | Status: DC | PRN
  Administered 2022-03-29: 0.2 mg via INTRAVENOUS
  Filled 2022-03-28: qty 1

## 2022-03-28 MED ORDER — MORPHINE BOLUS VIA INFUSION
4.0000 mg | INTRAVENOUS | Status: DC | PRN
  Administered 2022-03-29: 4 mg via INTRAVENOUS

## 2022-03-28 MED ORDER — BIOTENE DRY MOUTH MT LIQD: 15.0000 mL | OROMUCOSAL | Status: DC | PRN

## 2022-03-28 MED ORDER — POLYVINYL ALCOHOL 1.4 % OP SOLN: 1.0000 [drp] | Freq: Four times a day (QID) | OPHTHALMIC | Status: DC | PRN

## 2022-03-28 MED ORDER — FUROSEMIDE 10 MG/ML IJ SOLN
40.0000 mg | Freq: Once | INTRAMUSCULAR | Status: AC
  Administered 2022-03-28: 40 mg via INTRAVENOUS
  Filled 2022-03-28: qty 4

## 2022-03-28 MED ORDER — HALOPERIDOL LACTATE 5 MG/ML IJ SOLN: 2.0000 mg | INTRAMUSCULAR | Status: DC | PRN

## 2022-03-28 NOTE — Progress Notes (Signed)
  Progress Note   Patient: Keith Murray SEG:[id number] DOB: [birth year] DOA: 03/05/2022     7 DOS: the patient was seen and examined on 03/28/2022   Brief hospital course: 62yo with hx tobacco abuse presented with 4 days hx of sob, cough, found to be hypoxemic with CXR demonstrating multifocal PNA. Pt found to be covid pos, as well as cocaine and marijuana pos. Pt was given course of antiviral and continued on bipap. Pt was agitated and had been continued on precedex in ICU. With precedex being stopped, care was transferred to [facility]. 12/ 28.   Assessment and Plan: Acute hypoxic respiratory failure secondary to covid PNA and strep PNA -completed course of remdesivir -Pt was later continued on rocephin -despite weaning off bipap, pt's respiratory status did not significantly improve. -Palliative Care followed and on 03/28/22, decision was made for transition to comfort care status. Pt's wishes were already noted to be DNR  -Anticipating hospital death  Acute toxic metabolic encephalopathy -Recenly stopped precedex -valproic acid started by PCCM -later transitioned to comfort measures only  Hx Lung Cancer -Had been followed by Oncology  Tobacco abuse  Hyponatremia -Normalized  Hypotension secondary to hypovolemic shock -limited PO intake this visit -bp was improved with hydration -Later transitioned to comfort measures only  RUE edema -Recent UE doppler results reviewed, no evidence of RUE DVT or superficial thrombus -Later transitioned to comfort measure     Subjective: Difficult to assess this AM given mentation  Physical Exam: Vitals:   03/28/22 0900 03/28/22 1000 03/28/22 1100 03/28/22 1211  BP: 121/80 120/68 118/65   Pulse: 90 90 88   Resp: 16 18 19    Temp:    99.7 F (37.6 C)  TempSrc:    Axillary  SpO2: 98% (!) 83% 90%   Weight:      Height:       General exam: Awake, laying in bed, in nad Respiratory system: Normal respiratory effort, no  wheezing Cardiovascular system: regular rate, s1, s2 Gastrointestinal system: Soft, nondistended, positive BS Central nervous system: CN2-12 grossly intact, strength intact Extremities: Perfused, no clubbing Skin: Normal skin turgor, no notable skin lesions seen Psychiatry: Mood normal // no visual hallucinations   Data Reviewed:  Labs reviewed: Na 136, K 4.4, Cr 0.60  Family Communication: Pt in room, family not at bedside  Disposition: Status is: Inpatient Remains inpatient appropriate because: Severity of illness  Planned Discharge Destination:  Anticipate hospital death    Author: Marylu Lund, MD 03/28/2022 2:38 PM  For on call review [email or web address].

## 2022-03-28 NOTE — Progress Notes (Addendum)
Daily Progress Note   Patient Name: Keith Murray       Date: 03/28/2022 DOB: 1960-11-30  Age: 62 y.o. MRN#: [id number] Attending Physician: Keith Hazel, MD Primary Care Physician: Keith Mccreedy, MD Admit Date: 03/13/2022 Length of Stay: 7 days  Reason for Consultation/Follow-up: Establishing goals of care and symptom management  Subjective:   CC: Patient less responsive to voice and touch today.  Spoke with patient's niece, Keith Murray.  Following along for complex medical decision making and symptom management.  Subjective:  Reviewed EMR prior to seeing patient.  Patient's oxygen requirements remain high at 50L/min with FiO2 at 90%. Patient continuing to need non-rebreather for extra support in addition to this. Spoke with bedside RN who provided medical updates. Patient becoming less responsive to voice and touch. Patient received morphine 2mg  IV x 1 dose in past 24 hours. Has not received IV ativan in past 24 hours.   ------------------------------------------------------------------------------------------------------------- Advance Care Planning Conversation  Pertinent diagnosis: Acute hypoxic respiratory failure secondary to COVID-pneumonia and strep pneumonia, acute toxic metabolic encephalopathy, tobacco use, substance use  The patient and/or family consented to a voluntary Advance Care Planning Conversation. Individuals present for the conversation: Unable to participate in complex medical decision-making discussions due to medical status.  Spoke with patient's next of kin, Keith Murray, who is NP.  Palliative provider involved in entirety of conversation.  Summary of the conversation:  Able to call patient's niece, Keith Murray, who is a Designer, jewellery.  Able to update [name] about patient's worsening medical status at this time.  [name] noted she had called and spoke to nurse yesterday evening and had [name] patient was deteriorating.  She and her sister Keith Murray who are patient's family, have already been talking and know that it is time to transition him to comfort focused care.  Though [name] is a hospice NP as needed, discussed transitioning to comfort focused care such as starting medication for increased work of breathing and we will plan to discontinue high flow nasal cannula and nonrebreather.  [name] notes she and her family are agreeing to this decision.  Inquired if family planning to come and visit before transition to comfort care though [name] noted that she and her sister had visited with patient on Saturday and noted his last words to them was he loved them. She mentioned their really isn't other family who can visit and anyone else is trying to avoid COVID exposure; expressed understanding and sympathy with this.  [name] inquired what will happen once patient dies and noted that RN will reach out regarding choice of funeral home.  All questions answered at that time.  Provided emotional support as able.  Thanked [name] for allowing me to speak with her today.  She voiced appreciation for all the care her uncle has received.  Outcome of the conversations and/or documents completed:  Transition to comfort focused care at this time.  I spent 24 minutes providing separately identifiable ACP services with the patient and/or surrogate decision maker in a voluntary, in-person conversation discussing the patient's wishes and goals as detailed in the above note.  Keith Aus, DO Palliative Care Provider  -------------------------------------------------------------------------------------------------------------  Updated care team regarding this conversation transition to comfort focused care at this time.  Objective:   Vital Signs:  BP 118/65   Pulse 88   Temp 99.7 F (37.6 C) (Axillary)   Resp 19   Ht 6\' 1"  (1.854 m)   Wt 65.4  kg   SpO2 90%   BMI 19.02 kg/m   Physical Exam: General: Chronically ill-appearing,  frail with muscle wasting present, not responsive Eyes: No drainage noted HENT: On high flow nasal cannula as well as nonrebreather mask Cardiovascular: RRR Respiratory: On high flow nasal cannula and nonrebreather for respiratory support Abdomen: not distended Extremities: Muscle wasting present, hands and mittens Skin: no rashes or lesions on visible skin Neuro: not responsive  Imaging: I personally reviewed recent imaging.   Assessment & Plan:   Assessment: Patient is a 62 year old male with a past medical history of tobacco use and substance use who was admitted on 03/22/2022 for management of shortness of breath and cough.  Imaging demonstrated multifocal pneumonia.  Patient was also found to be COVID-positive.  UDS revealed cocaine and marijuana positive as well.  Patient has been receiving support in stepdown unit for respiratory status.  Palliative medicine team consulted to assist with complex medical decision making and symptom management.  Recommendations/Plan: # Complex medical decision making/goals of care:  - Patient not responsive and unable to participate in complex medical decision making due to medical status.  -Patient has 2 nieces who are next of kin.  Spoke with [name] who is a Designer, jewellery and works as needed for a hospice agency.  [name] confirm that she and Keith Murray have discussed knowing patient's medical status is worsening.  They are agreeing to transition to comfort focused care at this time.  -At this time we will discontinue interventions that are no longer focused on comfort such as IV fluids, imaging, or lab work.  Will instead focus on symptom management of pain, dyspnea, and agitation in the setting of end-of-life care.  - Code Status: DNR   # Symptom management    -Pain/Dyspnea, acute in the setting of end-of-life care                Patient was not on medications for pain previously.                               -Start IV morphine 2  mg/h continuous basal infusion.  Change prn IV morphine 4 mg q 6mins prn for breakthrough management in setting of HFNC removal. Continue to adjust based on patient's symptom burden.                   -Anxiety/agitation, in the setting of end-of-life care                               -Continue IV Ativan 1-2 mg every 4 hours as needed. Continue to adjust based on patient's symptom burden.                                 -Started IV Haldol 2 mg every 4 hours as needed. Continue to adjust based on patient's symptom burden.                   -Secretions, in the setting of end-of-life care                               -Started IV glycopyrrolate 0.2 mg every 4 hours as needed.  # Psychosocial Support:  -Spoke with patient's niece, [name]  Claiborne Billings  # Discharge Planning: Patient transition to comfort focused care at this time.  Patient will likely have in-hospital death as high flow nasal cannula will be discontinued and patient unable to maintain oxygen saturation due to lung disease.  Discussed with: Niece -Keith Murray, bedside RN, hospitalist  Thank you for allowing the palliative care team to participate in the care [facility].  Keith Aus, DO Palliative Care Provider PMT # [phone]  If patient remains symptomatic despite maximum doses, please call PMT at [phone] between 0700 and 1900. Outside of these hours, please call attending, as PMT does not have night coverage.

## 2022-03-28 DEATH — deceased

## 2022-03-29 DIAGNOSIS — U071 COVID-19: Secondary | ICD-10-CM | POA: Diagnosis not present

## 2022-03-29 DIAGNOSIS — Z515 Encounter for palliative care: Secondary | ICD-10-CM | POA: Diagnosis not present

## 2022-03-29 DIAGNOSIS — E871 Hypo-osmolality and hyponatremia: Secondary | ICD-10-CM | POA: Diagnosis not present

## 2022-03-29 DIAGNOSIS — J189 Pneumonia, unspecified organism: Secondary | ICD-10-CM | POA: Diagnosis not present

## 2022-03-29 DIAGNOSIS — R0902 Hypoxemia: Secondary | ICD-10-CM | POA: Diagnosis not present

## 2022-03-29 LAB — GLUCOSE, CAPILLARY
Glucose-Capillary: 104 mg/dL — ABNORMAL HIGH (ref 70–99)
Glucose-Capillary: 104 mg/dL — ABNORMAL HIGH (ref 70–99)

## 2022-03-29 MED ORDER — LORAZEPAM 2 MG/ML IJ SOLN: 2.0000 mg | INTRAMUSCULAR | Status: DC | PRN

## 2022-03-29 MED ORDER — MORPHINE BOLUS VIA INFUSION
5.0000 mg | INTRAVENOUS | Status: DC | PRN
  Administered 2022-03-29 (×2): 5 mg via INTRAVENOUS

## 2022-04-06 ENCOUNTER — Other Ambulatory Visit: Payer: Medicare Other

## 2022-04-06 ENCOUNTER — Ambulatory Visit (HOSPITAL_COMMUNITY): Payer: Medicare Other

## 2022-04-11 ENCOUNTER — Ambulatory Visit: Payer: Medicare Other | Admitting: Internal Medicine

## 2022-04-28 NOTE — Progress Notes (Signed)
The patient passed away at 11:17 with no respirations or pulse. Death witness by Meriel Pica RN. Family called and were expecting an imminent death. They have chosen [facility] on [address].

## 2022-04-28 NOTE — Progress Notes (Signed)
Nutrition Brief Note  Chart reviewed. Pt now transitioning to comfort care.  No further nutrition interventions planned at this time.  Please re-consult as needed.     Samson Frederic RD, LDN For contact information, refer to [facility].

## 2022-04-28 NOTE — Progress Notes (Signed)
10 mls of Morphine wasted. Witness by Molinda Bailiff RN.

## 2022-04-28 NOTE — Discharge Summary (Signed)
Death Summary  Keith Murray YWV:[id number] DOB: 1961-03-24 DOA: 17-Apr-2022  PCP: Benito Mccreedy, MD  Admit date: 2022-04-17 Date of Death: 2022-04-25 Time of Death: Jul 02, 1115 Notification: Benito Mccreedy, MD notified of death of 04-25-2022   History of present illness:  61yo with hx tobacco abuse presented with 4 days hx of sob, cough, found to be hypoxemic with CXR demonstrating multifocal PNA. Pt found to be covid pos, as well as cocaine and marijuana pos. Pt was given course of antiviral and continued on bipap. Pt was agitated and had been continued on precedex in ICU. With precedex being stopped, care was transferred to [facility]. 12/ 28    Final Diagnoses:  Acute hypoxic respiratory failure secondary to covid PNA and strep PNA -completed course of remdesivir -Pt was later continued on rocephin -despite weaning off bipap, pt's respiratory status did not significantly improve. -Palliative Care followed and on 03/28/22, decision was made for transition to comfort care status. Pt's wishes were already noted to be DNR by this time -Pt passed with dignity on 2022/04/25 at 07-02-15   Acute toxic metabolic encephalopathy -Recenly stopped precedex -valproic acid started by PCCM -later transitioned to comfort measures only   Hx Lung Cancer -Had been followed by Oncology prior to admission   Tobacco abuse   Hyponatremia -Normalized later this visit   Hypotension secondary to hypovolemic shock -limited PO intake this visit -bp had improved with hydration -Later transitioned to comfort measures only   RUE edema -Recent UE doppler results reviewed, no evidence of RUE DVT or superficial thrombus -Later transitioned to comfort measure    The results of significant diagnostics from this hospitalization (including imaging, microbiology, ancillary and laboratory) are listed below for reference.    Significant Diagnostic Studies: DG CHEST PORT 1 VIEW  Result Date: 03/28/2022 CLINICAL DATA:  CHF  EXAM: PORTABLE CHEST 1 VIEW COMPARISON:  03/25/2022. FINDINGS: Chest wall deformity right upper hemithorax. Alveolar process bilaterally with definite interval improvement on the left when compared to the prior study. There is suggestion of worsening on the right. No pneumothorax identified. There may be small pleural effusion on the right. Feeding tube tip is below the diaphragm in the region the stomach. IMPRESSION: Alveolar process bilaterally with interval improvement on the left and worsening on the right. Small right-sided pleural effusion. Chronic right upper hemithorax chest wall deformity. Electronically Signed   By: Sammie Bench M.D.   On: 03/28/2022 11:48   DG CHEST PORT 1 VIEW  Result Date: 03/25/2022 CLINICAL DATA:  Pneumonia. EXAM: PORTABLE CHEST 1 VIEW COMPARISON:  Chest radiographs 03/23/2022. Abdominal radiograph 03/24/2022. FINDINGS: A feeding tube is coiled in the stomach with tip near the gastric cardia, similar to yesterday's abdominal radiograph. The cardiomediastinal silhouette is unchanged. Multifocal airspace opacities are again seen bilaterally. Extensive opacity in the left mid lung has worsened, while right lung opacities have improved. No large pleural effusion or pneumothorax is identified. There is chronic deformity of the right upper hemithorax. IMPRESSION: Multifocal pneumonia. Interval worsening of left mid lung airspace disease and improved right lung infiltrates. Electronically Signed   By: Logan Bores M.D.   On: 03/25/2022 09:03   VAS [place] UPPER EXTREMITY VENOUS DUPLEX  Result Date: 03/24/2022 UPPER VENOUS STUDY  Patient Name:  Keith Murray  Date of Exam:   03/24/2022 Medical Rec #: [id number]     Accession #:    [id number] Date of Birth: February 03, 1961    Patient Gender: M Patient Age:   70 years Exam Location:  [facility] Procedure:      VAS [place] UPPER EXTREMITY VENOUS DUPLEX Referring Phys: Velna Hatchet OLLIS  --------------------------------------------------------------------------------  Indications: Swelling Risk Factors: COVID 19 positive Cancer. Limitations: Poor ultrasound/tissue interface, bandages, line and patient positioning, patient constant movement, poor patient cooperation. Comparison Study: No prior studies. Performing Technologist: Oliver Hum RVT  Examination Guidelines: A complete evaluation includes B-mode imaging, spectral Doppler, color Doppler, and power Doppler as needed of all accessible portions of each vessel. Bilateral testing is considered an integral part of a complete examination. Limited examinations for reoccurring indications may be performed as noted.  Right Findings: +----------+------------+---------+-----------+----------+-------+ RIGHT     CompressiblePhasicitySpontaneousPropertiesSummary +----------+------------+---------+-----------+----------+-------+ Subclavian    Full       Yes       Yes                      +----------+------------+---------+-----------+----------+-------+  Left Findings: +----------+------------+---------+-----------+----------+-------+ LEFT      CompressiblePhasicitySpontaneousPropertiesSummary +----------+------------+---------+-----------+----------+-------+ IJV           Full       Yes       Yes                      +----------+------------+---------+-----------+----------+-------+ Subclavian    Full       No        No                       +----------+------------+---------+-----------+----------+-------+ Axillary      Full       No        No                       +----------+------------+---------+-----------+----------+-------+ Brachial      Full       No        No                       +----------+------------+---------+-----------+----------+-------+ Radial      Partial                                         +----------+------------+---------+-----------+----------+-------+ Ulnar          Full                                          +----------+------------+---------+-----------+----------+-------+ Cephalic      Full                                          +----------+------------+---------+-----------+----------+-------+ Basilic       Full                                          +----------+------------+---------+-----------+----------+-------+ Incidental findings of distal ulnar artery occlusion.  Summary:  Right: No evidence of thrombosis in the subclavian.  Left: No evidence of deep vein thrombosis in the upper extremity. No evidence of superficial vein thrombosis in the upper extremity. Incidental findings of distal ulnar artery occlusion.  *See table(s) above for measurements and observations.  Diagnosing physician: Harold Barban MD Electronically signed by Harold Barban MD on 03/24/2022 at 5:24:06 PM.    Final    DG Abd Portable 1V  Result Date: 03/24/2022 CLINICAL DATA:  Feeding tube placement EXAM: PORTABLE ABDOMEN - 1 VIEW COMPARISON:  None Available. FINDINGS: Feeding tube coils in the stomach with the tip in the fundus. Nonobstructive bowel gas pattern. IMPRESSION: Feeding tube coils in the stomach. Electronically Signed   By: Rolm Baptise M.D.   On: 03/24/2022 17:08   ECHOCARDIOGRAM COMPLETE  Result Date: 03/23/2022    ECHOCARDIOGRAM REPORT   Patient Name:   Keith Murray Date of Exam: 03/23/2022 Medical Rec #:  [id number]    Height:       73.0 in Accession #:    [id number]   Weight:       134.0 lb Date of Birth:  [birth year]   BSA:          1.815 m Patient Age:    15 years     BP:           90/81 mmHg Patient Gender: M            HR:           51 bpm. Exam Location:  Inpatient Procedure: 2D Echo, Cardiac Doppler and Color Doppler Indications:    R07.9* Chest pain, unspecified. Elevated troponin.  History:        Patient has no prior history of Echocardiogram examinations.                 Covid positive. Polysubstance abuse. Cocaine.  Sonographer:    Roseanna Rainbow RDCS Referring Phys: [id number] PAULA B SIMPSON  Sonographer Comments: Technically difficult study due to poor echo windows. Image acquisition challenging due to uncooperative patient. Patient verbally abusive. Patient moving during exam. Patient in fowler's position. IMPRESSIONS  1. Left ventricular ejection fraction, by estimation, is 45 to 50%. The left ventricle has mildly decreased function. The left ventricle demonstrates global hypokinesis. Left ventricular diastolic parameters are indeterminate.  2. Right ventricular systolic function is normal. The right ventricular size is normal. Tricuspid regurgitation signal is inadequate for assessing PA pressure.  3. The mitral valve is grossly normal. Trivial mitral valve regurgitation. No evidence of mitral stenosis.  4. The aortic valve is tricuspid. Aortic valve regurgitation is not visualized. No aortic stenosis is present.  5. The inferior vena cava is normal in size with greater than 50% respiratory variability, suggesting right atrial pressure of 3 mmHg. Comparison(s): No prior Echocardiogram. FINDINGS  Left Ventricle: Left ventricular ejection fraction, by estimation, is 45 to 50%. The left ventricle has mildly decreased function. The left ventricle demonstrates global hypokinesis. The left ventricular internal cavity size was normal in size. There is  no left ventricular hypertrophy. Left ventricular diastolic parameters are indeterminate. Right Ventricle: The right ventricular size is normal. No increase in right ventricular wall thickness. Right ventricular systolic function is normal. Tricuspid regurgitation signal is inadequate for assessing PA pressure. Left Atrium: Left atrial size was normal in size. Right Atrium: Right atrial size was normal in size. Pericardium: There is no evidence of pericardial effusion. Mitral Valve: The mitral valve is grossly normal. There is mild thickening of the mitral valve leaflet(s). Trivial mitral valve regurgitation.  No evidence of mitral valve stenosis. Tricuspid Valve: The tricuspid valve is normal in structure. Tricuspid valve regurgitation is trivial. Aortic Valve: The aortic valve is tricuspid. Aortic valve regurgitation is not visualized. No aortic stenosis is  present. Pulmonic Valve: The pulmonic valve was grossly normal. Pulmonic valve regurgitation is trivial. Aorta: The aortic root is normal in size and structure. Venous: The inferior vena cava is normal in size with greater than 50% respiratory variability, suggesting right atrial pressure of 3 mmHg. IAS/Shunts: The atrial septum is grossly normal.  LEFT VENTRICLE PLAX 2D LVIDd:         5.60 cm      Diastology LVIDs:         4.70 cm      LV e' medial:    6.85 cm/s LV PW:         0.90 cm      LV E/e' medial:  10.2 LV IVS:        0.80 cm      LV e' lateral:   12.00 cm/s LVOT diam:     2.50 cm      LV E/e' lateral: 5.8 LV SV:         86 LV SV Index:   47 LVOT Area:     4.91 cm  LV Volumes (MOD) LV vol d, MOD A2C: 91.6 ml LV vol d, MOD A4C: 113.0 ml LV vol s, MOD A2C: 54.1 ml LV vol s, MOD A4C: 60.9 ml LV SV MOD A2C:     37.5 ml LV SV MOD A4C:     113.0 ml LV SV MOD BP:      46.8 ml RIGHT VENTRICLE             IVC RV S prime:     12.90 cm/s  IVC diam: 2.60 cm TAPSE (M-mode): 2.1 cm LEFT ATRIUM           Index        RIGHT ATRIUM           Index LA diam:      2.20 cm 1.21 cm/m   RA Area:     17.30 cm LA Vol (A2C): 25.3 ml 13.94 ml/m  RA Volume:   52.20 ml  28.76 ml/m LA Vol (A4C): 44.0 ml 24.24 ml/m  AORTIC VALVE LVOT Vmax:   85.40 cm/s LVOT Vmean:  50.000 cm/s LVOT VTI:    0.175 m  AORTA Ao Root diam: 3.40 cm MITRAL VALVE MV Area (PHT): 3.56 cm    SHUNTS MV Decel Time: 213 msec    Systemic VTI:  0.18 m MV E velocity: 70.05 cm/s  Systemic Diam: 2.50 cm MV A velocity: 66.60 cm/s MV E/A ratio:  1.05 Gwyndolyn Kaufman MD Electronically signed by Gwyndolyn Kaufman MD Signature Date/Time: 03/23/2022/4:00:20 PM    Final    DG Chest Port 1 View  Result Date:  03/23/2022 CLINICAL DATA:  Respiratory failure EXAM: PORTABLE CHEST 1 VIEW COMPARISON:  03/22/2022, 03/27/2022 FINDINGS: Stable cardiomediastinal contours. Patchy airspace opacities bilaterally, worsened from prior, particularly within the right lung. Chronic deformity of the upper right hemithorax. No large pleural fluid collection. No pneumothorax. IMPRESSION: Patchy airspace opacities bilaterally, worsened from prior, particularly within the right lung. Electronically Signed   By: Davina Poke D.O.   On: 03/23/2022 08:07   VAS [place] LOWER EXTREMITY VENOUS (DVT)  Result Date: 03/22/2022  Lower Venous DVT Study Patient Name:  Keith Murray  Date of Exam:   03/22/2022 Medical Rec #: [id number]     Accession #:    [id number] Date of Birth: [birth year]    Patient Gender: M Patient Age:   89 years Exam Location:  [facility] Procedure:  VAS [place] LOWER EXTREMITY VENOUS (DVT) Referring Phys: PAULA SIMPSON --------------------------------------------------------------------------------  Indications: Pulmonary embolism.  Risk Factors: COVID 19 positive. Limitations: Poor ultrasound/tissue interface and patient positioning. Comparison Study: No prior studies. Performing Technologist: Oliver Hum RVT  Examination Guidelines: A complete evaluation includes B-mode imaging, spectral Doppler, color Doppler, and power Doppler as needed of all accessible portions of each vessel. Bilateral testing is considered an integral part of a complete examination. Limited examinations for reoccurring indications may be performed as noted. The reflux portion of the exam is performed with the patient in reverse Trendelenburg.  +---------+---------------+---------+-----------+----------+--------------+ RIGHT    CompressibilityPhasicitySpontaneityPropertiesThrombus Aging +---------+---------------+---------+-----------+----------+--------------+ CFV      Full           Yes      Yes                                  +---------+---------------+---------+-----------+----------+--------------+ SFJ      Full                                                        +---------+---------------+---------+-----------+----------+--------------+ FV Prox  Full                                                        +---------+---------------+---------+-----------+----------+--------------+ FV Mid   Full                                                        +---------+---------------+---------+-----------+----------+--------------+ FV DistalFull                                                        +---------+---------------+---------+-----------+----------+--------------+ PFV      Full                                                        +---------+---------------+---------+-----------+----------+--------------+ POP      Full           Yes      Yes                                 +---------+---------------+---------+-----------+----------+--------------+ PTV      Full                                                        +---------+---------------+---------+-----------+----------+--------------+ PERO     Full                                                        +---------+---------------+---------+-----------+----------+--------------+   +---------+---------------+---------+-----------+----------+--------------+  LEFT     CompressibilityPhasicitySpontaneityPropertiesThrombus Aging +---------+---------------+---------+-----------+----------+--------------+ CFV      Full           Yes      Yes                                 +---------+---------------+---------+-----------+----------+--------------+ SFJ      Full                                                        +---------+---------------+---------+-----------+----------+--------------+ FV Prox  Full                                                         +---------+---------------+---------+-----------+----------+--------------+ FV Mid   Full                                                        +---------+---------------+---------+-----------+----------+--------------+ FV DistalFull                                                        +---------+---------------+---------+-----------+----------+--------------+ PFV      Full                                                        +---------+---------------+---------+-----------+----------+--------------+ POP      Full           Yes      Yes                                 +---------+---------------+---------+-----------+----------+--------------+ PTV      Full                                                        +---------+---------------+---------+-----------+----------+--------------+ PERO     Full                                                        +---------+---------------+---------+-----------+----------+--------------+     Summary: RIGHT: - There is no evidence of deep vein thrombosis in the lower extremity.  - No cystic structure found in the popliteal fossa.  LEFT: - There is no evidence of deep vein thrombosis in the lower extremity.  - No  cystic structure found in the popliteal fossa.  *See table(s) above for measurements and observations. Electronically signed by Jamelle Haring on 03/22/2022 at 4:33:31 PM.    Final    DG Chest Port 1 View  Result Date: 03/22/2022 CLINICAL DATA:  Shortness of breath EXAM: PORTABLE CHEST 1 VIEW COMPARISON:  03/26/2022 FINDINGS: Cardiac shadow is stable. Patchy infiltrate is again identified primarily within the left mid and lower lung similar to that seen on the prior exam. Post treatment changes are noted in the upper right chest. No sizable effusion is seen. Patchy airspace opacity is noted in the right lung base as well. IMPRESSION: Multifocal infiltrates primarily within the left lung. No significant interval change  from previous day is noted. Electronically Signed   By: Inez Catalina M.D.   On: 03/22/2022 15:54   DG Chest Port 1 View  Result Date: 03/12/2022 CLINICAL DATA:  Dyspnea EXAM: PORTABLE CHEST 1 VIEW COMPARISON:  01/28/2022 FINDINGS: Multiple healed right rib fractures with resultant right apical chest wall deformity and associated pleural thickening again noted. Interval development of multifocal bilateral airspace infiltrates, more focal within the left lung base in keeping with multifocal pneumonic infiltrate in the appropriate clinical setting. No pneumothorax or pleural effusion. Cardiac size within normal limits. Pulmonary vascularity is normal. No acute bone abnormality. IMPRESSION: 1. Interval development of multifocal pneumonic infiltrate, more focal within the left lung base. Electronically Signed   By: Fidela Salisbury M.D.   On: 03/26/2022 20:44    Microbiology: Recent Results (from the past 240 hour(s))  Resp panel by RT-PCR (RSV, Flu A&B, Covid) Anterior Nasal Swab     Status: Abnormal   Collection Time: 02/28/2022  8:57 PM   Specimen: Anterior Nasal Swab  Result Value Ref Range Status   SARS Coronavirus 2 by RT PCR POSITIVE (A) NEGATIVE Final    Comment: (NOTE) SARS-CoV-2 target nucleic acids are DETECTED.  The SARS-CoV-2 RNA is generally detectable in upper respiratory specimens during the acute phase of infection. Positive results are indicative of the presence of the identified virus, but do not rule out bacterial infection or co-infection with other pathogens not detected by the test. Clinical correlation with patient history and other diagnostic information is necessary to determine patient infection status. The expected result is Negative.  Fact Sheet for Patients: EntrepreneurPulse.com.au  Fact Sheet for Healthcare Providers: IncredibleEmployment.be  This test is not yet approved or cleared by the [place] FDA and  has been  authorized for detection and/or diagnosis of SARS-CoV-2 by FDA under an Emergency Use Authorization (EUA).  This EUA will remain in effect (meaning this test can be used) for the duration of  the COVID-19 declaration under Section 564(b)(1) of the A ct, 21 U.S.C. section 360bbb-3(b)(1), unless the authorization is terminated or revoked sooner.     Influenza A by PCR NEGATIVE NEGATIVE Final   Influenza B by PCR NEGATIVE NEGATIVE Final    Comment: (NOTE) The Xpert Xpress SARS-CoV-2/FLU/RSV plus assay is intended as an aid in the diagnosis of influenza from Nasopharyngeal swab specimens and should not be used as a sole basis for treatment. Nasal washings and aspirates are unacceptable for Xpert Xpress SARS-CoV-2/FLU/RSV testing.  Fact Sheet for Patients: EntrepreneurPulse.com.au  Fact Sheet for Healthcare Providers: IncredibleEmployment.be  This test is not yet approved or cleared by the [place] FDA and has been authorized for detection and/or diagnosis of SARS-CoV-2 by FDA under an Emergency Use Authorization (EUA). This EUA will remain in effect (meaning this test  can be used) for the duration of the COVID-19 declaration under Section 564(b)(1) of the Act, 21 U.S.C. section 360bbb-3(b)(1), unless the authorization is terminated or revoked.     Resp Syncytial Virus by PCR NEGATIVE NEGATIVE Final    Comment: (NOTE) Fact Sheet for Patients: EntrepreneurPulse.com.au  Fact Sheet for Healthcare Providers: IncredibleEmployment.be  This test is not yet approved or cleared by the [place] FDA and has been authorized for detection and/or diagnosis of SARS-CoV-2 by FDA under an Emergency Use Authorization (EUA). This EUA will remain in effect (meaning this test can be used) for the duration of the COVID-19 declaration under Section 564(b)(1) of the Act, 21 U.S.C. section 360bbb-3(b)(1), unless the  authorization is terminated or revoked.  Performed at [facility], [place] [address], [place], [place] [address]   Blood Culture (routine x 2)     Status: None   Collection Time: 03/11/2022  9:12 PM   Specimen: BLOOD  Result Value Ref Range Status   Specimen Description   Final    BLOOD RIGHT ANTECUBITAL Performed at [facility] [address] [place], [place] [address]    Special Requests   Final    BOTTLES DRAWN AEROBIC AND ANAEROBIC Blood Culture results may not be optimal due to an excessive volume of blood received in culture bottles Performed at [facility] [address] [place], [place] [address]    Culture   Final    NO GROWTH 5 DAYS Performed at [facility], [address], [place], [place] [address]    Report Status 03/26/2022 FINAL  Final  MRSA Next Gen by PCR, Nasal     Status: None   Collection Time: 03/22/22 12:23 AM   Specimen: Nasal Mucosa; Nasal Swab  Result Value Ref Range Status   MRSA by PCR Next Gen NOT DETECTED NOT DETECTED Final    Comment: (NOTE) The GeneXpert MRSA Assay (FDA approved for NASAL specimens only), is one component of a comprehensive MRSA colonization surveillance program. It is not intended to diagnose MRSA infection nor to guide or monitor treatment for MRSA infections. Test performance is not FDA approved in patients less than 53 years old. Performed at [facility], [place] [address], [place], [place] [address]   Blood Culture (routine x 2)     Status: None   Collection Time: 03/22/22  7:21 AM   Specimen: BLOOD  Result Value Ref Range Status   Specimen Description   Final    BLOOD BLOOD LEFT ARM AEROBIC BOTTLE ONLY Performed at [facility] [address], [place], [place] [address]    Special Requests   Final    BOTTLES DRAWN AEROBIC AND ANAEROBIC Blood Culture adequate volume Performed at [facility] [address], [place], [place] [address]    Culture   Final    NO GROWTH 5 DAYS Performed at [facility], [place] [address], [place],  [address]    Report Status 03/27/2022 FINAL  Final     Labs: Basic Metabolic Panel: Recent Labs  Lab 03/23/22 0328 03/25/22 0521 03/26/22 0900 03/27/22 0301 03/28/22 0532  NA 134* 137 138 139 136  K 4.3 4.2 4.0 4.3 4.4  CL 106 106 101 102 97*  CO2 21* 26 30 30  33*  GLUCOSE 121* 96 107* 105* 103*  BUN 26* 16 16 14 17   CREATININE 0.88 0.71 0.60* 0.62 0.60*  CALCIUM 8.4* 8.2* 8.6* 8.7*  8.3*  MG 2.2 2.1 2.0  --   --    Liver Function Tests: Recent Labs  Lab 03/23/22 0328 03/25/22 0521 03/26/22 0900 03/27/22 0301 03/28/22 0532  AST 23 26 30  33 33  ALT 21 25 26 25 27   ALKPHOS 132* 102 99 91 79  BILITOT 0.6 0.6 0.4 0.3 0.2*  PROT 6.2* 6.3* 6.2* 5.6* 5.6*  ALBUMIN 2.4* 2.2* 2.4* 2.1* 2.3*   No results for input(s): "LIPASE", "AMYLASE" in the last 168 hours. No results for input(s): "AMMONIA" in the last 168 hours. CBC: Recent Labs  Lab 03/24/22 0341 03/25/22 0306 03/26/22 0900 03/27/22 0915 03/28/22 0532  WBC 9.3 6.7 6.5 6.2 5.3  HGB 12.2* 11.6* 11.4* 11.4* 10.7*  HCT 36.8* 34.5* 34.2* 34.9* 33.7*  MCV 93.4 93.0 92.2 95.4 96.8  PLT [phone]   Cardiac Enzymes: No results for input(s): "CKTOTAL", "CKMB", "CKMBINDEX", "TROPONINI" in the last 168 hours. D-Dimer No results for input(s): "DDIMER" in the last 72 hours. BNP: Invalid input(s): "POCBNP" CBG: Recent Labs  Lab 03/27/22 2015 03/28/22 0046 03/28/22 0408 03/28/22 0817 03/28/22 1208  GLUCAP 94 92 87 99 100*   Anemia work up No results for input(s): "VITAMINB12", "FOLATE", "FERRITIN", "TIBC", "IRON", "RETICCTPCT" in the last 72 hours. Urinalysis    Component Value Date/Time   COLORURINE YELLOW 03/22/2022 0548   APPEARANCEUR CLEAR 03/22/2022 0548   LABSPEC 1.010 03/22/2022 0548   PHURINE 6.0 03/22/2022 0548   GLUCOSEU NEGATIVE 03/22/2022  0548   HGBUR SMALL (A) 03/22/2022 0548   BILIRUBINUR NEGATIVE 03/22/2022 0548   KETONESUR NEGATIVE 03/22/2022 0548   PROTEINUR 30 (A) 03/22/2022 0548   UROBILINOGEN 0.2 04/22/2010 1719   NITRITE NEGATIVE 03/22/2022 0548   LEUKOCYTESUR NEGATIVE 03/22/2022 0548   Sepsis Labs Recent Labs  Lab 03/25/22 0306 03/26/22 0900 03/27/22 0915 03/28/22 0532  WBC 6.7 6.5 6.2 5.3   SIGNED:  Marylu Lund, MD  [facility] 2022-04-22, 2:14 PM  If 7PM-7AM, please contact night-coverage [email or web address] Password [email or web address]

## 2022-04-28 NOTE — Progress Notes (Signed)
Daily Progress Note   Patient Name: Keith Murray       Date: 04/15/2022 DOB: 26-Aug-1960  Age: 62 y.o. MRN#: [id number] Attending Physician: Donne Hazel, MD Primary Care Physician: Benito Mccreedy, MD Admit Date: 03/22/2022 Length of Stay: 8 days  Reason for Consultation/Follow-up: Establishing goals of care and symptom management  Subjective:   CC: Patient incision to comfort care on 03/28/2022.  Following up regarding symptom management in the setting of end-of-life care.  Subjective:  Discussed care with bedside RN prior to seeing patient.  Patient's respiratory rate remains elevated and patient remains tachycardic.  Will increase medications accordingly for symptom management at the end of life.  When seeing patient today he is not responsive to voice or touch.  Increased work of breathing noted with accessory muscle use.  Objective:   Vital Signs:  BP 110/70   Pulse (!) 114   Temp 97.7 F (36.5 C) (Axillary)   Resp (!) 28   Ht 6\' 1"  (1.854 m)   Wt 65.4 kg   SpO2 (!) 58%   BMI 19.02 kg/m   Physical Exam: General: Chronically ill-appearing, frail with muscle wasting present, not responsive Eyes: No drainage noted HENT: on [place] Cardiovascular: tachycardia noted Respiratory: Increased work of breathing noted Abdomen: not distended Extremities: Muscle wasting present Skin: no rashes or lesions on visible skin Neuro: not responsive  Imaging: I personally reviewed recent imaging.   Assessment & Plan:   Assessment: Patient is a 62 year old male with a past medical history of tobacco use and substance use who was admitted on 03/22/2022 for management of shortness of breath and cough.  Imaging demonstrated multifocal pneumonia.  Patient was also found to be COVID-positive.  UDS revealed cocaine and marijuana positive as well.  Patient has been receiving support in stepdown unit for respiratory status.  Palliative medicine team consulted to assist with complex medical  decision making and symptom management.  Recommendations/Plan: # Complex medical decision making/goals of care:  - Patient not responsive and unable to participate in complex medical decision making due to medical status.  Spoke with patient's niece, [name], on 03/28/2022 and patient was transition to full comfort care at that time.  Continue to focus on symptom management of pain, dyspnea, and agitation in the setting of end-of-life care.  - Code Status: DNR   # Symptom management    -Pain/Dyspnea, acute in the setting of end-of-life care                Patient was not on medications for pain previously.                               -Increase IV morphine 4 mg/h continuous basal infusion.  Increase prn IV morphine 5 mg q 26mins prn for breakthrough management. Continue to adjust based on patient's symptom burden.                   -Anxiety/agitation, in the setting of end-of-life care                               -Increase IV Ativan 2 mg every 4 hours as needed. Continue to adjust based on patient's symptom burden.                                 -  Continue IV Haldol 2 mg every 4 hours as needed. Continue to adjust based on patient's symptom burden.                   -Secretions, in the setting of end-of-life care                               -Started IV glycopyrrolate 0.2 mg every 4 hours as needed.  # Psychosocial Support:  -Spoke with patient's niece, [name]  # Discharge Planning: Patient transition to comfort focused care at this time.  Patient will likely have in-hospital death as high flow nasal cannula will be discontinued and patient unable to maintain oxygen saturation due to lung disease.  Discussed with: bedside RN  Thank you for allowing the palliative care team to participate in the care [facility].  [name], DO Palliative Care Provider PMT # [phone]  If patient remains symptomatic despite maximum doses, please call PMT at [phone] between 0700  and 1900. Outside of these hours, please call attending, as PMT does not have night coverage.

## 2022-04-28 DEATH — deceased
